# Patient Record
Sex: Female | Born: 1960 | Race: White | Hispanic: No | Marital: Married | Smoking: Never smoker
Health system: Southern US, Community
[De-identification: ages and names within clinical notes are randomized; demographics above are authoritative.]

## PROBLEM LIST (undated history)

## (undated) DIAGNOSIS — L02419 Cutaneous abscess of limb, unspecified: Secondary | ICD-10-CM

## (undated) DIAGNOSIS — F32A Depression, unspecified: Secondary | ICD-10-CM

## (undated) DIAGNOSIS — Z1379 Encounter for other screening for genetic and chromosomal anomalies: Secondary | ICD-10-CM

## (undated) DIAGNOSIS — D701 Agranulocytosis secondary to cancer chemotherapy: Secondary | ICD-10-CM

## (undated) DIAGNOSIS — I509 Heart failure, unspecified: Secondary | ICD-10-CM

## (undated) DIAGNOSIS — K219 Gastro-esophageal reflux disease without esophagitis: Secondary | ICD-10-CM

## (undated) DIAGNOSIS — D6481 Anemia due to antineoplastic chemotherapy: Secondary | ICD-10-CM

## (undated) DIAGNOSIS — R809 Proteinuria, unspecified: Secondary | ICD-10-CM

## (undated) DIAGNOSIS — I10 Essential (primary) hypertension: Secondary | ICD-10-CM

## (undated) DIAGNOSIS — J45909 Unspecified asthma, uncomplicated: Secondary | ICD-10-CM

## (undated) DIAGNOSIS — Z9889 Other specified postprocedural states: Secondary | ICD-10-CM

## (undated) DIAGNOSIS — R011 Cardiac murmur, unspecified: Secondary | ICD-10-CM

## (undated) DIAGNOSIS — R609 Edema, unspecified: Secondary | ICD-10-CM

## (undated) DIAGNOSIS — L259 Unspecified contact dermatitis, unspecified cause: Secondary | ICD-10-CM

## (undated) DIAGNOSIS — T451X5A Adverse effect of antineoplastic and immunosuppressive drugs, initial encounter: Secondary | ICD-10-CM

## (undated) DIAGNOSIS — C801 Malignant (primary) neoplasm, unspecified: Secondary | ICD-10-CM

## (undated) DIAGNOSIS — M129 Arthropathy, unspecified: Secondary | ICD-10-CM

## (undated) DIAGNOSIS — K5000 Crohn's disease of small intestine without complications: Secondary | ICD-10-CM

## (undated) DIAGNOSIS — L03119 Cellulitis of unspecified part of limb: Secondary | ICD-10-CM

## (undated) DIAGNOSIS — K625 Hemorrhage of anus and rectum: Secondary | ICD-10-CM

## (undated) DIAGNOSIS — F329 Major depressive disorder, single episode, unspecified: Secondary | ICD-10-CM

## (undated) DIAGNOSIS — C439 Malignant melanoma of skin, unspecified: Secondary | ICD-10-CM

## (undated) DIAGNOSIS — L739 Follicular disorder, unspecified: Secondary | ICD-10-CM

## (undated) DIAGNOSIS — R0602 Shortness of breath: Secondary | ICD-10-CM

## (undated) DIAGNOSIS — N393 Stress incontinence (female) (male): Secondary | ICD-10-CM

## (undated) DIAGNOSIS — Z6841 Body Mass Index (BMI) 40.0 and over, adult: Secondary | ICD-10-CM

## (undated) DIAGNOSIS — M1990 Unspecified osteoarthritis, unspecified site: Secondary | ICD-10-CM

## (undated) DIAGNOSIS — G4700 Insomnia, unspecified: Secondary | ICD-10-CM

## (undated) DIAGNOSIS — K5090 Crohn's disease, unspecified, without complications: Secondary | ICD-10-CM

## (undated) DIAGNOSIS — F419 Anxiety disorder, unspecified: Secondary | ICD-10-CM

## (undated) DIAGNOSIS — L88 Pyoderma gangrenosum: Secondary | ICD-10-CM

## (undated) DIAGNOSIS — K429 Umbilical hernia without obstruction or gangrene: Secondary | ICD-10-CM

## (undated) DIAGNOSIS — J45901 Unspecified asthma with (acute) exacerbation: Secondary | ICD-10-CM

## (undated) DIAGNOSIS — J302 Other seasonal allergic rhinitis: Secondary | ICD-10-CM

## (undated) DIAGNOSIS — J449 Chronic obstructive pulmonary disease, unspecified: Secondary | ICD-10-CM

## (undated) DIAGNOSIS — S91009A Unspecified open wound, unspecified ankle, initial encounter: Secondary | ICD-10-CM

## (undated) DIAGNOSIS — B373 Candidiasis of vulva and vagina: Secondary | ICD-10-CM

## (undated) DIAGNOSIS — Z85820 Personal history of malignant melanoma of skin: Secondary | ICD-10-CM

## (undated) DIAGNOSIS — S81809A Unspecified open wound, unspecified lower leg, initial encounter: Secondary | ICD-10-CM

## (undated) DIAGNOSIS — E876 Hypokalemia: Secondary | ICD-10-CM

## (undated) DIAGNOSIS — S81009A Unspecified open wound, unspecified knee, initial encounter: Secondary | ICD-10-CM

## (undated) DIAGNOSIS — F411 Generalized anxiety disorder: Secondary | ICD-10-CM

## (undated) DIAGNOSIS — C4490 Unspecified malignant neoplasm of skin, unspecified: Secondary | ICD-10-CM

## (undated) HISTORY — DX: Unspecified malignant neoplasm of skin, unspecified: C44.90

## (undated) HISTORY — DX: Other specified postprocedural states: Z98.890

## (undated) HISTORY — DX: Cellulitis of unspecified part of limb: L03.119

## (undated) HISTORY — DX: Unspecified asthma with (acute) exacerbation: J45.901

## (undated) HISTORY — DX: Unspecified open wound, unspecified ankle, initial encounter: S91.009A

## (undated) HISTORY — DX: Proteinuria, unspecified: R80.9

## (undated) HISTORY — DX: Follicular disorder, unspecified: L73.9

## (undated) HISTORY — DX: Umbilical hernia without obstruction or gangrene: K42.9

## (undated) HISTORY — DX: Unspecified open wound, unspecified knee, initial encounter: S81.009A

## (undated) HISTORY — DX: Crohn's disease of small intestine without complications: K50.00

## (undated) HISTORY — DX: Depression, unspecified: F32.A

## (undated) HISTORY — DX: Morbid (severe) obesity due to excess calories: E66.01

## (undated) HISTORY — DX: Gastro-esophageal reflux disease without esophagitis: K21.9

## (undated) HISTORY — DX: Personal history of malignant melanoma of skin: Z85.820

## (undated) HISTORY — DX: Adverse effect of antineoplastic and immunosuppressive drugs, initial encounter: T45.1X5A

## (undated) HISTORY — DX: Candidiasis of vulva and vagina: B37.3

## (undated) HISTORY — DX: Chronic obstructive pulmonary disease, unspecified: J44.9

## (undated) HISTORY — DX: Anxiety disorder, unspecified: F41.9

## (undated) HISTORY — DX: Unspecified osteoarthritis, unspecified site: M19.90

## (undated) HISTORY — DX: Edema, unspecified: R60.9

## (undated) HISTORY — DX: Encounter for other screening for genetic and chromosomal anomalies: Z13.79

## (undated) HISTORY — PX: COLONOSCOPY WITH ESOPHAGOGASTRODUODENOSCOPY (EGD): SHX5779

## (undated) HISTORY — DX: Heart failure, unspecified: I50.9

## (undated) HISTORY — DX: Other seasonal allergic rhinitis: J30.2

## (undated) HISTORY — DX: Cardiac murmur, unspecified: R01.1

## (undated) HISTORY — DX: Body Mass Index (BMI) 40.0 and over, adult: Z684

## (undated) HISTORY — DX: Unspecified open wound, unspecified lower leg, initial encounter: S81.809A

## (undated) HISTORY — DX: Hemorrhage of anus and rectum: K62.5

## (undated) HISTORY — DX: Shortness of breath: R06.02

## (undated) HISTORY — DX: Stress incontinence (female) (male): N39.3

## (undated) HISTORY — DX: Malignant melanoma of skin, unspecified: C43.9

## (undated) HISTORY — DX: Hypokalemia: E87.6

## (undated) HISTORY — PX: DILATION AND CURETTAGE OF UTERUS: SHX78

## (undated) HISTORY — DX: Major depressive disorder, single episode, unspecified: F32.9

## (undated) HISTORY — PX: UMBILICAL HERNIA REPAIR: SHX196

## (undated) HISTORY — DX: Unspecified contact dermatitis, unspecified cause: L25.9

## (undated) HISTORY — DX: Crohn's disease, unspecified, without complications: K50.90

## (undated) HISTORY — DX: Essential (primary) hypertension: I10

## (undated) HISTORY — DX: Cutaneous abscess of limb, unspecified: L02.419

## (undated) HISTORY — DX: Generalized anxiety disorder: F41.1

## (undated) HISTORY — DX: Arthropathy, unspecified: M12.9

## (undated) HISTORY — DX: Anemia due to antineoplastic chemotherapy: D64.81

## (undated) HISTORY — DX: Agranulocytosis secondary to cancer chemotherapy: D70.1

## (undated) HISTORY — DX: Pyoderma gangrenosum: L88

---

## 2001-04-12 DIAGNOSIS — C4490 Unspecified malignant neoplasm of skin, unspecified: Secondary | ICD-10-CM | POA: Insufficient documentation

## 2001-06-10 HISTORY — PX: MELANOMA EXCISION: SHX5266

## 2005-11-15 ENCOUNTER — Ambulatory Visit: Payer: Self-pay | Admitting: Internal Medicine

## 2005-12-20 ENCOUNTER — Ambulatory Visit: Payer: Self-pay | Admitting: Internal Medicine

## 2006-01-12 ENCOUNTER — Ambulatory Visit: Payer: Self-pay | Admitting: Internal Medicine

## 2006-03-14 ENCOUNTER — Ambulatory Visit: Payer: Self-pay | Admitting: Internal Medicine

## 2006-04-19 ENCOUNTER — Ambulatory Visit: Payer: Self-pay | Admitting: Internal Medicine

## 2006-09-13 ENCOUNTER — Ambulatory Visit: Payer: Self-pay | Admitting: Internal Medicine

## 2006-11-10 ENCOUNTER — Encounter: Payer: Self-pay | Admitting: Internal Medicine

## 2006-11-10 ENCOUNTER — Ambulatory Visit (HOSPITAL_COMMUNITY): Admission: RE | Admit: 2006-11-10 | Discharge: 2006-11-10 | Payer: Self-pay | Admitting: Internal Medicine

## 2006-11-14 ENCOUNTER — Ambulatory Visit: Payer: Self-pay | Admitting: Internal Medicine

## 2007-02-21 ENCOUNTER — Ambulatory Visit: Payer: Self-pay | Admitting: Internal Medicine

## 2007-03-13 ENCOUNTER — Encounter: Admission: RE | Admit: 2007-03-13 | Discharge: 2007-03-13 | Payer: Self-pay | Admitting: Internal Medicine

## 2007-03-27 ENCOUNTER — Encounter: Admission: RE | Admit: 2007-03-27 | Discharge: 2007-03-27 | Payer: Self-pay | Admitting: Internal Medicine

## 2007-05-16 ENCOUNTER — Ambulatory Visit (HOSPITAL_COMMUNITY): Admission: RE | Admit: 2007-05-16 | Discharge: 2007-05-16 | Payer: Self-pay | Admitting: Obstetrics and Gynecology

## 2007-06-01 DIAGNOSIS — M129 Arthropathy, unspecified: Secondary | ICD-10-CM

## 2007-06-01 DIAGNOSIS — K5000 Crohn's disease of small intestine without complications: Secondary | ICD-10-CM | POA: Insufficient documentation

## 2007-06-01 DIAGNOSIS — J45909 Unspecified asthma, uncomplicated: Secondary | ICD-10-CM

## 2007-06-01 DIAGNOSIS — I10 Essential (primary) hypertension: Secondary | ICD-10-CM | POA: Insufficient documentation

## 2007-06-01 DIAGNOSIS — F411 Generalized anxiety disorder: Secondary | ICD-10-CM

## 2007-06-01 DIAGNOSIS — Z85820 Personal history of malignant melanoma of skin: Secondary | ICD-10-CM

## 2007-06-01 DIAGNOSIS — K219 Gastro-esophageal reflux disease without esophagitis: Secondary | ICD-10-CM | POA: Insufficient documentation

## 2007-06-01 HISTORY — DX: Generalized anxiety disorder: F41.1

## 2007-06-06 ENCOUNTER — Encounter: Admission: RE | Admit: 2007-06-06 | Discharge: 2007-06-06 | Payer: Self-pay | Admitting: Obstetrics and Gynecology

## 2007-08-07 ENCOUNTER — Telehealth: Payer: Self-pay | Admitting: Internal Medicine

## 2007-08-10 ENCOUNTER — Ambulatory Visit (HOSPITAL_COMMUNITY): Admission: RE | Admit: 2007-08-10 | Discharge: 2007-08-10 | Payer: Self-pay | Admitting: Obstetrics and Gynecology

## 2007-09-18 ENCOUNTER — Ambulatory Visit: Payer: Self-pay | Admitting: Internal Medicine

## 2007-10-04 ENCOUNTER — Telehealth: Payer: Self-pay | Admitting: Internal Medicine

## 2007-10-05 ENCOUNTER — Ambulatory Visit (HOSPITAL_COMMUNITY): Admission: RE | Admit: 2007-10-05 | Discharge: 2007-10-05 | Payer: Self-pay | Admitting: Internal Medicine

## 2007-10-05 ENCOUNTER — Encounter: Payer: Self-pay | Admitting: Internal Medicine

## 2007-10-08 ENCOUNTER — Encounter: Payer: Self-pay | Admitting: Internal Medicine

## 2007-10-09 ENCOUNTER — Ambulatory Visit: Payer: Self-pay | Admitting: Internal Medicine

## 2007-11-09 ENCOUNTER — Encounter: Payer: Self-pay | Admitting: Internal Medicine

## 2007-11-15 ENCOUNTER — Telehealth: Payer: Self-pay | Admitting: Internal Medicine

## 2007-11-16 ENCOUNTER — Telehealth: Payer: Self-pay | Admitting: Internal Medicine

## 2007-11-17 ENCOUNTER — Encounter: Payer: Self-pay | Admitting: Internal Medicine

## 2007-11-29 ENCOUNTER — Encounter: Payer: Self-pay | Admitting: Internal Medicine

## 2007-12-29 ENCOUNTER — Telehealth: Payer: Self-pay | Admitting: Internal Medicine

## 2008-02-01 ENCOUNTER — Ambulatory Visit: Payer: Self-pay | Admitting: Internal Medicine

## 2008-03-25 ENCOUNTER — Encounter: Payer: Self-pay | Admitting: Internal Medicine

## 2008-04-17 ENCOUNTER — Encounter: Admission: RE | Admit: 2008-04-17 | Discharge: 2008-04-17 | Payer: Self-pay | Admitting: Internal Medicine

## 2008-07-21 ENCOUNTER — Emergency Department (HOSPITAL_COMMUNITY): Admission: EM | Admit: 2008-07-21 | Discharge: 2008-07-22 | Payer: Self-pay | Admitting: Emergency Medicine

## 2008-07-21 ENCOUNTER — Emergency Department (HOSPITAL_BASED_OUTPATIENT_CLINIC_OR_DEPARTMENT_OTHER): Admission: EM | Admit: 2008-07-21 | Discharge: 2008-07-21 | Payer: Self-pay | Admitting: Emergency Medicine

## 2008-08-23 ENCOUNTER — Encounter (HOSPITAL_BASED_OUTPATIENT_CLINIC_OR_DEPARTMENT_OTHER): Admission: RE | Admit: 2008-08-23 | Discharge: 2008-11-21 | Payer: Self-pay | Admitting: General Surgery

## 2008-10-15 ENCOUNTER — Ambulatory Visit: Payer: Self-pay | Admitting: Internal Medicine

## 2008-10-15 DIAGNOSIS — K625 Hemorrhage of anus and rectum: Secondary | ICD-10-CM

## 2008-10-16 ENCOUNTER — Encounter: Payer: Self-pay | Admitting: Internal Medicine

## 2008-10-16 LAB — CONVERTED CEMR LAB
Eosinophils Relative: 1.4 % (ref 0.0–5.0)
HCT: 33 % — ABNORMAL LOW (ref 36.0–46.0)
Hemoglobin: 11.2 g/dL — ABNORMAL LOW (ref 12.0–15.0)
Lymphs Abs: 1.5 10*3/uL (ref 0.7–4.0)
Monocytes Relative: 7.3 % (ref 3.0–12.0)
Platelets: 245 10*3/uL (ref 150.0–400.0)
WBC: 8 10*3/uL (ref 4.5–10.5)

## 2008-11-14 ENCOUNTER — Telehealth: Payer: Self-pay | Admitting: Internal Medicine

## 2008-11-25 ENCOUNTER — Encounter (HOSPITAL_BASED_OUTPATIENT_CLINIC_OR_DEPARTMENT_OTHER): Admission: RE | Admit: 2008-11-25 | Discharge: 2009-02-20 | Payer: Self-pay | Admitting: General Surgery

## 2008-11-26 ENCOUNTER — Telehealth: Payer: Self-pay | Admitting: Internal Medicine

## 2008-12-09 ENCOUNTER — Telehealth: Payer: Self-pay | Admitting: Internal Medicine

## 2008-12-17 ENCOUNTER — Telehealth: Payer: Self-pay | Admitting: Internal Medicine

## 2008-12-20 ENCOUNTER — Telehealth: Payer: Self-pay | Admitting: Internal Medicine

## 2009-01-17 ENCOUNTER — Emergency Department (HOSPITAL_BASED_OUTPATIENT_CLINIC_OR_DEPARTMENT_OTHER): Admission: EM | Admit: 2009-01-17 | Discharge: 2009-01-18 | Payer: Self-pay | Admitting: Emergency Medicine

## 2009-01-20 ENCOUNTER — Ambulatory Visit: Admission: RE | Admit: 2009-01-20 | Discharge: 2009-01-20 | Payer: Self-pay | Admitting: General Surgery

## 2009-01-20 ENCOUNTER — Encounter (HOSPITAL_BASED_OUTPATIENT_CLINIC_OR_DEPARTMENT_OTHER): Payer: Self-pay | Admitting: General Surgery

## 2009-01-20 ENCOUNTER — Ambulatory Visit: Payer: Self-pay | Admitting: Vascular Surgery

## 2009-01-31 ENCOUNTER — Encounter: Payer: Self-pay | Admitting: Internal Medicine

## 2009-02-10 ENCOUNTER — Ambulatory Visit: Payer: Self-pay | Admitting: Internal Medicine

## 2009-02-17 ENCOUNTER — Ambulatory Visit (HOSPITAL_COMMUNITY): Admission: RE | Admit: 2009-02-17 | Discharge: 2009-02-17 | Payer: Self-pay | Admitting: General Surgery

## 2009-02-20 ENCOUNTER — Encounter: Payer: Self-pay | Admitting: Internal Medicine

## 2009-02-20 ENCOUNTER — Ambulatory Visit: Payer: Self-pay | Admitting: Internal Medicine

## 2009-02-20 ENCOUNTER — Ambulatory Visit (HOSPITAL_COMMUNITY): Admission: RE | Admit: 2009-02-20 | Discharge: 2009-02-20 | Payer: Self-pay | Admitting: Internal Medicine

## 2009-02-21 ENCOUNTER — Encounter (HOSPITAL_BASED_OUTPATIENT_CLINIC_OR_DEPARTMENT_OTHER): Admission: RE | Admit: 2009-02-21 | Discharge: 2009-05-22 | Payer: Self-pay | Admitting: General Surgery

## 2009-02-23 ENCOUNTER — Encounter: Payer: Self-pay | Admitting: Internal Medicine

## 2009-02-24 ENCOUNTER — Encounter: Payer: Self-pay | Admitting: Infectious Disease

## 2009-03-10 ENCOUNTER — Encounter (INDEPENDENT_AMBULATORY_CARE_PROVIDER_SITE_OTHER): Payer: Self-pay | Admitting: *Deleted

## 2009-03-10 DIAGNOSIS — S81809A Unspecified open wound, unspecified lower leg, initial encounter: Secondary | ICD-10-CM

## 2009-03-10 DIAGNOSIS — S91009A Unspecified open wound, unspecified ankle, initial encounter: Secondary | ICD-10-CM

## 2009-03-10 DIAGNOSIS — S81009A Unspecified open wound, unspecified knee, initial encounter: Secondary | ICD-10-CM

## 2009-03-10 HISTORY — DX: Unspecified open wound, unspecified knee, initial encounter: S81.009A

## 2009-03-11 ENCOUNTER — Encounter: Payer: Self-pay | Admitting: Internal Medicine

## 2009-03-24 ENCOUNTER — Ambulatory Visit: Payer: Self-pay | Admitting: Infectious Disease

## 2009-03-24 DIAGNOSIS — L03119 Cellulitis of unspecified part of limb: Secondary | ICD-10-CM

## 2009-03-24 DIAGNOSIS — R609 Edema, unspecified: Secondary | ICD-10-CM | POA: Insufficient documentation

## 2009-03-24 DIAGNOSIS — L02419 Cutaneous abscess of limb, unspecified: Secondary | ICD-10-CM

## 2009-03-24 LAB — CONVERTED CEMR LAB
Albumin: 4.3 g/dL (ref 3.5–5.2)
BUN: 18 mg/dL (ref 6–23)
CO2: 26 meq/L (ref 19–32)
Calcium: 9.7 mg/dL (ref 8.4–10.5)
Chloride: 99 meq/L (ref 96–112)
Eosinophils Absolute: 0.1 10*3/uL (ref 0.0–0.7)
Eosinophils Relative: 1 % (ref 0–5)
Glucose, Bld: 86 mg/dL (ref 70–99)
HCT: 37.5 % (ref 36.0–46.0)
Lymphs Abs: 2.2 10*3/uL (ref 0.7–4.0)
MCV: 84.3 fL (ref >=78.0)
Monocytes Absolute: 0.7 10*3/uL (ref 0.1–1.0)
Monocytes Relative: 7 % (ref 3–12)
Platelets: 346 10*3/uL (ref 150–400)
Potassium: 3.7 meq/L (ref 3.5–5.3)
RBC: 4.45 M/uL (ref 3.87–5.11)
WBC: 10.1 10*3/uL (ref 4.0–10.5)

## 2009-04-10 ENCOUNTER — Ambulatory Visit: Payer: Self-pay | Admitting: Infectious Disease

## 2009-04-10 DIAGNOSIS — L259 Unspecified contact dermatitis, unspecified cause: Secondary | ICD-10-CM

## 2009-04-10 DIAGNOSIS — B373 Candidiasis of vulva and vagina: Secondary | ICD-10-CM | POA: Insufficient documentation

## 2009-04-28 ENCOUNTER — Telehealth: Payer: Self-pay | Admitting: Infectious Disease

## 2009-05-02 ENCOUNTER — Encounter: Payer: Self-pay | Admitting: Internal Medicine

## 2009-05-12 ENCOUNTER — Ambulatory Visit: Payer: Self-pay | Admitting: Infectious Disease

## 2009-05-12 DIAGNOSIS — L88 Pyoderma gangrenosum: Secondary | ICD-10-CM

## 2009-05-12 LAB — CONVERTED CEMR LAB
Blood Glucose, Fingerstick: 104
Hgb A1c MFr Bld: 6.1 %

## 2009-05-22 ENCOUNTER — Telehealth: Payer: Self-pay | Admitting: Infectious Disease

## 2009-07-10 ENCOUNTER — Encounter: Admission: RE | Admit: 2009-07-10 | Discharge: 2009-07-10 | Payer: Self-pay | Admitting: Obstetrics and Gynecology

## 2010-03-06 ENCOUNTER — Encounter: Admission: RE | Admit: 2010-03-06 | Discharge: 2010-03-06 | Payer: Self-pay | Admitting: *Deleted

## 2010-05-02 ENCOUNTER — Encounter: Payer: Self-pay | Admitting: Gastroenterology

## 2010-05-03 ENCOUNTER — Encounter: Payer: Self-pay | Admitting: Obstetrics and Gynecology

## 2010-05-12 NOTE — Letter (Signed)
Summary: [facility]  [facility]   Imported By: Bubba Hales 05/12/2009 08:15:31  _____________________________________________________________________  External Attachment:    Type:   Image     Comment:   External Document

## 2010-05-12 NOTE — Progress Notes (Signed)
Summary: phone note-ty  Phone Note Call from Patient   Caller: Patient Call For: Rhina Brackett Dam MD Summary of Call: Patient called and decided not to take the steroids b/c of her weight, and her risk factors. She wants you to call her to discuss other abx. Initial call taken by: Jarrett Ables CMA,  May 22, 2009 3:09 PM  Follow-up for Phone Call        I will give her oral dapsone and topical temovate. She needs to make appt with Dr. Ronnald Ramp with Dermatology for further recs re topical steroids Follow-up by: Alcide Evener MD,  May 22, 2009 3:49 PM    New/Updated Medications: DAPSONE 100 MG TABS (DAPSONE) Take 1 tablet by mouth once a day CLOBETASOL PROPIONATE 0.05 % OINT (CLOBETASOL PROPIONATE) apply twice daily to leg lesoin for 10 days, please make appt with Dr. Ronnald Ramp for recommendations on further topical steroids Prescriptions: CLOBETASOL PROPIONATE 0.05 % OINT (CLOBETASOL PROPIONATE) apply twice daily to leg lesoin for 10 days, please make appt with Dr. Ronnald Ramp for recommendations on further topical steroids  #30 x 1   Entered and Authorized by:   Alcide Evener MD   Signed by:   Rhina Brackett Dam MD on 05/22/2009   Method used:   Electronically to        [facility]. [phone]* (retail)       [address]       [place], [place]  [address]       Ph: [phone]       Fax: [phone]   RxID:   [id number] DAPSONE 100 MG TABS (DAPSONE) Take 1 tablet by mouth once a day  #30 x 11   Entered and Authorized by:   Alcide Evener MD   Signed by:   Alcide Evener MD on 05/22/2009   Method used:   Electronically to        [facility] (retail)       [address]       [place], [place]  [address]       Ph: [phone]       Fax: [phone]   [place]:   [phone]
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
## 2010-05-12 NOTE — Progress Notes (Signed)
Summary: patient wanting lab results, and having c/o inflammation  Phone Note Call from Patient Call back at Home Phone [phone]   Caller: Patient Reason for Call: Acute Illness, Talk to Doctor Summary of Call: Patient called inquiring about lab results, and having increased inflammation, and redness. Initial call taken by: Alcide Evener MD,  April 29, 2009 5:13 PM  Follow-up for Phone Call        I discussed pts labs, culturs and antibiotics. I spent 40 minutes talking to her. We are consiering MRI of leg. We will hold off on antibiotics for now and check bavk ion by phone if neededed Follow-up by: Alcide Evener MD,  April 29, 2009 5:07 PM

## 2010-08-25 NOTE — Assessment & Plan Note (Signed)
[facility] OFFICE NOTE   NAME:CARROLLJayd, Forrey                       MRN:          [id number]  DATE:09/13/2006                            DOB:          1960/11/14    [name] is a 50 year old, white female with Crohn's disease of the  terminal ileum diagnosed on colonoscopy by Dr. Valli Glance in May 2007  showing ulcerations of the terminal ileum. She has also positive  Prometheus profile for IBD markers. Her other medical problems are  morbid obesity, gastroesophageal reflux disease.   MEDICATIONS:  1. Symbicort 160/4.5.  2. Vitamin E.  3. Acidophilus.  4. Chondroitin/glucosamine.  5. Pentasa 500 mg about 6 a day.  6. Robinul Forte 2 mg p.o. b.i.d.  7. Diovan 60 mg 2 daily.  8. Nexium 40 mg p.o. daily.  9. Proventil inhaler.  10.Fish oil.  11.Maxzide 37.5/25 one p.o. daily.   She comes today with the complaint of intermittent diarrhea and lower  abdominal pain with Flonase. Her diarrhea is in bouts lasting several  days up to a week. Her last episode occurred several weeks ago and ended  about a week ago. She is currently much better. During the episode, she  usually increases her Pentasa to about 6 or 7 tablets a day, total of 3  grams a day. She has also Robinul Forte, increasing the dose from 1 a  day to 2 a day. There has been no rectal bleeding. The patient has  managed to intentionally lose about 20 pounds since last appointment.   PHYSICAL EXAMINATION:  VITAL SIGNS:  Blood pressure 110/72, pulse 72 and  weight 300 pounds. Her last weight was 320 pounds.  GENERAL:  She was alert and oriented in no distress.  LUNGS:  Clear to auscultation.  COR:  Normal S1, normal S2.  ABDOMEN:  Obese, soft with minimal tenderness in the left lower  quadrant. Her right lower quadrant today was unremarkable. Bowel sounds  were normal active.  RECTAL:  There was normal rectal tone. Stool was soft, Hemoccult  negative.   A 50 year old white female with terminal ileitis by biopsies. She is  having exacerbations of her Crohn's disease. She has never taken 6-  mercaptopurine or Entocort. The patient is quite reluctant to take  steroids because of obesity and possibility of weight gain with this  medication. We are not really sure of the extent of her Crohn's disease  as far as her small bowel is concerned. She is not showing any signs of  small-bowel obstruction.   PLAN:  1. Consider small bowel capsule endoscopy to assess her Crohn's      disease of the small intestine. The patient does not want to go      through this test because it requires swallowing a large pill and      she feels she would not be able to do that.  2. Consider repeat colonoscopy and biopsy of the terminal ileum. She      would like to go ahead and let me colonoscope her so we can see the  pathology ourselves to see if the disease has progressed since last      year. Since she had problems with conscious sedation, I believe she      will need propofol anesthesia.  3. Continue Pentasa 500 mg 6 tablets a day.  4. Refill Robinul Forte 2 mg p.o. b.i.d.  5. Add Questran 4 grams 1 peg daily on p.r.n. basis.  6. Flagyl 250 mg dispensed 14, one p.o. b.i.d. for bacterial      overgrowth.     Lowella Bandy. Olevia Perches, MD  Electronically Signed    DMB/MedQ  DD: 09/13/2006  DT: 09/13/2006  Job #: [id number]   cc:   Tawanna Cooler, MD

## 2010-08-25 NOTE — Assessment & Plan Note (Signed)
[facility]   NAME:  [name], [name]              ACCOUNT NO.:  [id number]   MEDICAL RECORD NO.:  [id number]      DATE OF BIRTH:  1961-02-09   PHYSICIAN:  Kathrin Penner, M.D.         VISIT DATE:                                   OFFICE VISIT   PROBLEM:  Abscess, left posterior calf.   The patient was seen last on Aug 26, 2008.  This wound was packed with  plain gauze packing, and she was started on Augmentin 875 mg b.i.d.  She  returns today for a recheck of her wound.  The wound packing has been  removed completely.  There is still a small cavity there, which was  repacked with 1/4th inch gauze packing.  She is to continue on her  Augmentin 875 b.i.d.  In the interim, she has started to have some  vaginal itching.  I have started her on Diflucan 100 mg daily for the  next 5 days.      Kathrin Penner, M.D.  Electronically Signed     PB/MEDQ  D:  09/03/2008  T:  09/04/2008  Job:  [id number]

## 2010-08-25 NOTE — Assessment & Plan Note (Signed)
[facility]   NAME:  [name], [name]              ACCOUNT NO.:  [id number]   MEDICAL RECORD NO.:  [id number]      DATE OF BIRTH:  [birth year]   PHYSICIAN:  Elesa Hacker, M.D.         VISIT DATE:  11/26/2008                                   OFFICE VISIT   PROBLEM:  This is a 50 year old female who had drainage of her leg  abscess several months ago.  This was packed and the wound has been  getting smaller slowly.  Today, the patient is afebrile, stable.  The  wound is down to a pinpoint with abundant granulation tissues.  The  wound was probed.  There is no overhang and I could not find any  sinuses.  Temperature is 98.5.  pulse 72, respirations 20, and blood  pressure 130/70.   PLAN:  Wash daily and dress with a Band-Aid as long as necessary.  I  think packing is no longer necessary as the wound is very superficial  and quite small.   FOLLOWUP:  See in 2 weeks.      Elesa Hacker, M.D.  Electronically Signed     RA/MEDQ  D:  11/26/2008  T:  11/27/2008  Job:  [id number]

## 2010-08-25 NOTE — Assessment & Plan Note (Signed)
[facility] OFFICE NOTE   NAME:[name], [name]                       MRN:          [id number]  DATE:02/21/2007                            DOB:          10-30-1960    [name] is a 50 year old white female with inflammatory bowel  disease found on two prior colonoscopies, first one by Dr. Valli Glance in  2007. Most recent one in July 2008, showing nonspecific ileitis. This  was confirmed on the biopsies of the terminal ileum. She also has  positive inflammatory bowel disease markers, but her colon appeared  entirely normal. There was no evidence of inflammation. She has chronic  diarrhea, which has been controlled on combination of Questran and  Robinul Forte as well as on Pentasa 500 mg six tablets a day. Since the  last appointment, she has had some rectal pain that she wanted to have  checked today.   PHYSICAL EXAMINATION:  Blood pressure 130/72. Pulse 70. Weight is 310  pounds, which is a 10 pound weight gain since appointment in June.  ABDOMEN: Examination today is unremarkable with normoactive bowel  sounds. No focal tenderness. On anoscopic and rectal examination,  perianal area is normal. Anal canal is normal. There is no evidence of  fissure. Rectal ampulla appears unremarkable with normal mucosa. No  evidence of internal hemorrhoids with minimal discomfort on the digital  examination in the anal canal. Nothing to suggest abscess or fistula.   IMPRESSION:  1. A 50 year old white female with ileitis documented on recent      colonoscopy associated with positive irritable bowel disease      markers and no evidence of colitis.  2. Rectal pain was nonspecific. Currently, there is no evidence of      active disease.   PLAN:  1. Samples of Analpram to use p.r.n. rectal irritation.  2. Continue Pentasa, Robinul Forte and Questran.  3. The patient was referred to surgeon for evaluation of right breast  lump.     [name]. Olevia Perches, MD  Electronically Signed    DMB/MedQ  DD: 02/21/2007  DT: 02/21/2007  Job #: [id number]   cc:   Fortunato Curling, MD
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
## 2010-08-25 NOTE — Assessment & Plan Note (Signed)
[facility]   NAME:  [name], [name]              ACCOUNT NO.:  [id number]   MEDICAL RECORD NO.:  [id number]      DATE OF BIRTH:  1960/10/30   PHYSICIAN:  Kathrin Penner, M.D.    VISIT DATE:  12/09/2008                                   OFFICE VISIT   PROBLEM:  Ulcer, status post drainage of left posterior leg abscess in  this 50 year old female.  She returns today following her most recent  treatment.  She has no specific complaints referable to her wound at  this time and she feels that is quite well healed except for a very  small area of persistent erythema around the puncture site.   On examination, temperature is 97.6, pulse 70, respirations 19, and  blood pressure is 110/67.  There is some mild redness around the wound.  The wound is completely healed and on palpation, there is really no  significant tenderness.   Treatment today, I will start her on some mupirocin 2% to be used twice  daily for the next week.  Followup will be in 2 weeks p.r.n.      Kathrin Penner, M.D.  Electronically Signed     PB/MEDQ  D:  12/09/2008  T:  12/10/2008  Job:  [id number]

## 2010-08-25 NOTE — Assessment & Plan Note (Signed)
[facility]   NAME:  JOSEFITA, WEISSMANN NO.:  [id number]   MEDICAL RECORD NO.:  [id number]      DATE OF BIRTH:  12/29/1960   PHYSICIAN:  Ricard Dillon, M.D. VISIT DATE:  10/03/2008                                   OFFICE VISIT   Ms. Alberta is a lady who is status post surgical drainage of the left  posterior calf and treated initially with incremental advanced packings  of the wound.  She was treated with Augmentin 875 p.o. b.i.d.  There was  apparently a culture of this wound initially done at [facility].  We do  not have these results.  Culture done by Dr. Bubba Camp on Aug 26, 2008,  showed no growth.  The patient is simply covering this with a dry  dressing at this point.  There is continued drainage out of the small  surgical cavity.   On examination, the area is on her posterior left calf.  This has a firm  area superior to the opening with firm palpation.  There is  serosanguineous fluid expressed.  I did reculture this.  The wound area  measures 0.7 x 0.2, but I think the depth of this goes down to well over  a centimeter.   IMPRESSION:  Probably resolving left calf abscess with still some  serosanguineous drainage.  This could have also been a hematoma.  I have  recultured this area.  We have packed the opening with silver alginate,  which she will cover with a dry dressing and can be changed every 3  days.  We will see her again in a week's time.  I did not prescribe any  further antibiotic therapy.           ______________________________  Ricard Dillon, M.D.     MGR/MEDQ  D:  10/03/2008  T:  10/04/2008  Job:  [id number]

## 2010-08-25 NOTE — Assessment & Plan Note (Signed)
[facility]   NAME:  [name], [name]              ACCOUNT NO.:  [id number]   MEDICAL RECORD NO.:  [id number]      DATE OF BIRTH:  1960/09/17   PHYSICIAN:  Kathrin Penner, M.D.    VISIT DATE:  09/16/2008                                   OFFICE VISIT   PROBLEM  Left Calf Abscess   [name] is status post drainage of an abscess of the left posterior  calf treated with incremental advancement of packings of the wound.  She  was treated with Augmentin 875 mg b.i.d.  She returns today and is  feeling generally well.   EXAM  There is a small eschar over the wound which I went on the roof.  There  is a cavity of approximately 0.5 cm without any drainage.  The  surrounding area of the wound continues to be mildly indurated.  I think  I will continue her on Augmentin for another week.  We will dress her  wound with hydrogel and a dry dressing.  DISP  Follow up in 2 weeks.      Kathrin Penner, M.D.  Electronically Signed     PB/MEDQ  D:  09/16/2008  T:  09/17/2008  Job:  [id number]

## 2010-08-25 NOTE — Assessment & Plan Note (Signed)
[facility]   NAME:  [name], [name]              ACCOUNT NO.:  [id number]   MEDICAL RECORD NO.:  [id number]      DATE OF BIRTH:  06-07-1960   PHYSICIAN:  Kathrin Penner, M.D.    VISIT DATE:  11/04/2008                                   OFFICE VISIT   PROBLEM:  This is a 50 year old lady status post posterior leg abscess  on the left side, which was treated with packing and incremental  advancement of the packing to its current subcentimeter size.  She did  note that a pocket of either old blood or bloody plus drained during her  packing this past week.  She has not been having any fevers or chills.  There is no surrounding erythema to the wound, and she noted no odor to  the drainage.   On today's evaluation, the patient is afebrile with stable vital signs  and is in no distress.  I recultured the wound today.  I will stop her  from using Iodoform dressing and now move to a plain gauze packing  inside the wound, which is rather shallow at 0.2 cm in depth with 0.6 in  width and 0.5 in length.   My plan is for followup in 1 week.      Kathrin Penner, M.D.  Electronically Signed     PB/MEDQ  D:  11/04/2008  T:  11/05/2008  Job:  [id number]

## 2010-08-25 NOTE — Assessment & Plan Note (Signed)
[facility]   NAME:  [name], [name]              ACCOUNT NO.:  [id number]   MEDICAL RECORD NO.:  [id number]      DATE OF BIRTH:  02-13-1961   PHYSICIAN:  Kathrin Penner, M.D.    VISIT DATE:  10/21/2008                                   OFFICE VISIT   The patient is status post a left posterior calf abscess, treated with  packing in incremental advancement of now the wound is down to a  subcentimeter size with a depth of approximately 3 mm, healing  satisfactorily.  Most recent cultures showed no growth.  We will  continue her on a small amount of iodoform packing to keep the wound  opened.  Followup will be in 1 week.      Kathrin Penner, M.D.  Electronically Signed     PB/MEDQ  D:  10/21/2008  T:  10/22/2008  Job:  [id number]

## 2010-08-28 NOTE — Assessment & Plan Note (Signed)
[facility]                             PULMONARY OFFICE NOTE   NAME:[name], [name]                       MRN:          [id number]  DATE:03/14/2006                            DOB:          06-04-1960    PROBLEM LIST:  1. Asthma.  2. Allergic rhinitis.  3. Cough.  4. Hypertension.  5. Crohn's disease.  6. History melanoma resection.   HISTORY:  This woman was previously followed at [facility] where she was last seen in 2005. At that time,  she was using Foradil and Azmacort which says was the best regimen she  had found for her asthma but difficult to afford. She had had thinning  hair attributed to Asacol. She is followed by Dr. Delfin Edis for  Crohn's disease and by [name] for primary care. She comes today  saying Foradil and Azmacort did well for years. More recently she had  been working with [place]. [place]une [place] [facility]. He put her on a Pulmicort  inhaler and then switched her to Symbicort 160/4.5. She is still using a  rescue Proventil inhaler about once a day. Before the Symbicort she was  using it up to 6 times a day. She feels somewhat hoarse and notices that  when she is trying to sing at church. Sometimes she will notice some  shortness of breath singing. There has been more nasal congestion in the  last 2 months. I could not clearly relate it through her history to fall  season. She says she had no benefit from at least a couple of different  nasal sprays.   MEDICATIONS:  1. Vitamin E.  2. Triamterene 37.5/HCTZ 25.  3. Nexium 40 mg.  4. Tylenol arthritis 650 mg up to 6 daily.  5. Xanax 1 mg at h.s.  6. Pepcid 2-4 times daily.  7. Zyrtec 10 mg.  8. Diovan/HCTZ 160/12.5.  9. Proventil rescue inhaler.  10.Robinul Forte.  11.Promethazine 25 mg p.r.n. nausea.   DRUG INTOLERANCES:  Intolerance of CODEINE causing nausea and LEVAQUIN,  ERYTHROMYCIN.   OBJECTIVE:  VITAL SIGNS:  Weight 313  pounds, BP 122/70, pulse regular  84, room air saturation 98%.  HEENT:  I question minimal thrush. Thick tongue base, long palate.  GENERAL:  Obese body habitus, very talkative, many questions which were  appropriate and answered accordingly.  LUNGS:  Fields were clear. There was no cough or wheeze heard.  HEART:  Sounds were regular without murmur or gallop.  EXTREMITIES:  No tremor, cyanosis or clubbing.   IMPRESSION:  Asthma/rhinitis. I could not identify history suggestive of  significant reflux as a contributor  to her airway problems but she is  instructed to watch for this.   PLAN:  1. I gently suggested that weight loss would help her breathing.  2. Try sample of Singulair 10 mg daily.  3. Saline nasal lavage 2-3 times daily with instructions.  4. Travel here she says is difficult so I have offered to see her      again p.r.n. and she was comfortable with  this.     Clinton D. Annamaria Boots, MD, [place], FACP  Electronically Signed    CDY/MedQ  DD: 03/14/2006  DT: 03/15/2006  Job #: [id number]

## 2010-08-28 NOTE — Assessment & Plan Note (Signed)
[facility] OFFICE NOTE   NAME:[name], [name]                       MRN:          [id number]  DATE:12/20/2005                            DOB:          1960-06-28    PROGRESS NOTE:  [name] is a 50 year old white female diagnosed with  terminal ileum ulceration suggestive of Crohn's disease by [name] on  colonoscopy in May of 2007.  Her Promethius  profile for inflammatory bowel  disease markers is positive, consistent with Crohn's disease.  She has  chronic diarrhea.  We have put her on Robinul Forte an anti-spasmotic, which  seemed to improve the diarrhea tremendously.  She is complaining of  malodorous stools, but no bleeding.  She is on multiple other medications  including ferrous sulfate.  We have reviewed the records which we obtained  from [name].  Her sedimentation rate was normal at 20.  Her Hemoccults  have been negative.   PHYSICAL EXAMINATION:  VITAL SIGNS:  Blood pressure 118/66, pulse 72, and  weight 317 pounds.  GENERAL:  She was in no distress today.  ABDOMEN:  Somewhat tender in the right lower quadrant and right middle  quadrant.  Bowel sounds were normoactive.  General physical exam was limited  because of her large size.  LUNGS:  Clear to auscultation.  RECTAL:  Normal perineal area.  Stool was Hemoccult negative.   IMPRESSION:  A 50 year old white female with suspected low-grade  inflammatory bowel disease, improved on antispasmodics.  She was intolerant  to Asacol because of hair loss.  She is likely to be intolerant to  Mesalazine products.   PLAN:  1. Flagyl 250 p.o. t.i.d. for 1 week.  2. Continue Robinul Forte at the regular dose of 2 mg twice a day.  3. Check her iron and iron binding capacity today to decide whether she      needs to continue the iron supplements.  4. Question of whether she should be started on prednisone at this time.      I would prefer to  wait, since she does not have dramatic symptoms, and      since she is high risk for developing diabetes and intolerance to      prednisone.  I will see her again in 8 weeks and reassess.                                   [name]. Kaylee Perches, MD   DMB/MedQ  DD:  12/20/2005  DT:  12/21/2005  Job #:  [id number]   cc:   [name], M.D.

## 2010-08-28 NOTE — Assessment & Plan Note (Signed)
[facility] OFFICE NOTE   NAME:[name], [name]                       MRN:          [id number]  DATE:04/19/2006                            DOB:          [birth year]    PROGRESS NOTE   [name] is a 50 year old patient of Dr. Ishmael Holter' with a history of  Crohn's disease in the terminal ileum and intermittent diarrhea.  She  had a flare-up of diarrheal illness on Thanksgiving, which she blames on  dietary indiscretion.  The diarrhea lasted several weeks.  She is now  improved about 50%.  There is some left lower quadrant abdominal pain  and tenderness in the right lower quadrant, but since she resumed her  Pentasa and Robinul Forte, her symptoms have improved.  She had a  colonoscopy by Dr. Valli Glance in May 2007 with findings of a terminal ileum  ulcerations consistent with Crohn's disease.  Also, her Prometheus  profile for inflammatory bowel disease antibody is positive.  She has  gastroesophageal reflux and obesity.   PHYSICAL EXAM:  Blood pressure 120/80, pulse 72, and weight 320 pounds,  which represents a weight gain of 7 pounds.  She was alert, oriented, no distress.  LUNGS:  Clear to auscultation.  COR:  With rapid S1 and S2.  ABDOMEN:  Obese, soft, tender in the left lower quadrant, left middle  quadrant, and also in the right lower quadrant.  There was no mass.  The  exam was limited because of the large size of her abdomen.  Upper  abdomen was unremarkable.  RECTAL:  Not done.  EXTREMITIES:  No edema.   IMPRESSION:  A 49 year old white female with inflammatory bowel disease  in the terminal ileum consistent with Crohn's disease based on  colonoscopic findings by Dr. Valli Glance in May 2007.  She has positive  markers for Crohn's disease.  The recent flare-up may be related to  bacterial overgrowth possibly, or also due to viral gastroenteritis.   PLAN:  1. Resume Pentasa at 500 mg 1 p.o. t.i.d.  2.  Continue Robinul Forte 2 mg once or twice a day.  3. Add Flagyl 250 mg t.i.d. for 10 days.  4. Consider adding Entocort to her regimen.  I would hold off on      Entocort at this time because of  her potential for diabetes and      weight gain.  5. Samples of Nexium and Pentasa given to the patient.    [name]. Olevia Perches, MD  Electronically Signed   DMB/MedQ  DD: 04/19/2006  DT: 04/19/2006  Job #: [id number]   cc:   Fortunato Curling, MD

## 2010-08-28 NOTE — Assessment & Plan Note (Signed)
[facility] OFFICE NOTE   NAME:[name], [name]                       MRN:          [id number]  DATE:11/15/2005                            DOB:          1960/12/12    [name] is a 50 year old white female self referred for second opinion  of diarrhea and presumed inflammatory bowel disease.  The patient brings  with her extensive records which are at least 60 pages.  I have not had a  chance  to review them since she brought them with her today but she has had  intermittent diarrhea, 6-8 bowel movements during the day, never at night,  for the past several years.  Colonoscopy in 2003 showed some small  ulcerations in the rectum but the biopsies were nondiagnostic.  She had  another colonoscopy May of this year apparently which also did not show any  evidence of inflammatory bowel disease on biopsy.  Her small bowel follow  through was normal.  She denies any bright red blood per rectum but has had  dark stools, according to her.  She is not sure if it was blood.  She has  urgency in the morning as well as after meals with some loose stools and  crampy abdominal pain.  She has occasional constipation, abdominal pain in  both lower quadrants.  She also had a barium enema in the past.  She thinks  she had colon polyps on the past or her father had colon polyps.   MEDICATIONS:  1.  Pentasa 500 mg four a day.  2.  Diovan 320 mg p.o. q.d.  This was recently increased from 160 mg q.d.  3.  Vitamin E.  4.  Triamterene 37.5/25.  5.  Furosemide 10 mg q.d.  6.  Nexium 40 mg p.o. q.d.  7.  Xanax 1 mg h.s.  8.  Klonopin 5 mg one or  two p.r.n.  9.  Pepcid Complete 2-4 a day.  10. Zyrtec.  11. Fish oil.  12. Cranberry juice.  13. __________, not taking at the moment.  14. Azmacort.  15. Promethazine p.r.n.   PAST MEDICAL HISTORY:  Melanoma, status post excision in March 2003.   FAMILY HISTORY:  Positive for  colon polyps in her father, heart disease in  uncle and father.   SOCIAL HISTORY:  Married, no children, 12th grade education.  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  The patient does not smoke, does not drink.   REVIEW OF SYSTEMS:  Positive for obesity.  No recent changes in weight,  excessive swelling in feet, dizziness, arthritic complaints, sleeping  problems, fatigue or iron deficiency.  Periods are regular.   PHYSICAL EXAMINATION:  VITAL SIGNS: Blood pressure 133/86, pulse 72, weight  314 pounds.  GENERAL:  She was obese, alert and oriented.  HEENT:  Sclera anicteric.  Oral cavity:  No active ulcers.  NECK:  Supple.  LUNGS:  Clear to auscultation.  COR:  Normal S1 and S2.  ABDOMEN:  Obese, soft and nontender with normoactive bowel sounds.  No  palpable mass.  RECTAL:  Normal  appearing and stool was yellow, Hemoccult negative.  EXTREMITIES:  There was 2+ edema.   LABORATORY DATA:  There are some labs in the medical records brought with  the patient.  She had a normal hemoglobin of 13 but 16% iron saturation.   IMPRESSION:  1.  This is a 50 year old white female who brought extensive medical records      which I have not had a chance  to review.  Suspected inflammatory bowel      disease but brief review shows no histological evidence of IBD.  Her      diarrhea sounds more like an irritable bowel syndrome, possible bacteria      overgrowth.  Doubt celiac sprue because of her obesity.  2.  Iron deficiency without anemia.  3.  Anxiety.  4.  Morbid obesity.   PLAN:  1.  Review old records.  2.  Samples of Flora-Q one p.o. q.d.  3.  Robinul forte 2 mg p.o. q.d.  4.  Inflammatory bowel disease.  __________ levels and sed rate.  Since      Pentazine and Asacol both can cause hair loss and she is complaining of      hair loss, I told her if we do not find evidence of IBD, that she will      be able to discontinue these medications.                                   [name].  Kaylee Perches, MD   DMB/MedQ  DD:  11/15/2005  DT:  11/15/2005  Job #:  [id number]   cc:   Tawanna Cooler, M.D.

## 2010-10-26 ENCOUNTER — Other Ambulatory Visit: Payer: Self-pay | Admitting: Gastroenterology

## 2010-10-26 DIAGNOSIS — Z1231 Encounter for screening mammogram for malignant neoplasm of breast: Secondary | ICD-10-CM

## 2010-11-03 ENCOUNTER — Ambulatory Visit
Admission: RE | Admit: 2010-11-03 | Discharge: 2010-11-03 | Disposition: A | Discharge status: Home / Self Care | Payer: BC Managed Care – PPO | Source: Ambulatory Visit | Attending: Gastroenterology | Admitting: Gastroenterology

## 2010-11-03 DIAGNOSIS — Z1231 Encounter for screening mammogram for malignant neoplasm of breast: Secondary | ICD-10-CM

## 2011-01-05 LAB — BASIC METABOLIC PANEL
BUN: 17
CO2: 27
Chloride: 98
Glucose, Bld: 91
Potassium: 3.7

## 2011-01-05 LAB — CBC
HCT: 37
Hemoglobin: 12.7
MCHC: 34.2
MCV: 84
RBC: 4.4
WBC: 7.9

## 2011-01-25 LAB — BASIC METABOLIC PANEL
BUN: 7
CO2: 28
Chloride: 103
Creatinine, Ser: 0.72
GFR calc Af Amer: >60
Glucose, Bld: 98

## 2011-03-25 ENCOUNTER — Encounter: Payer: Self-pay | Admitting: *Deleted

## 2011-04-01 ENCOUNTER — Ambulatory Visit: Payer: BC Managed Care – PPO | Admitting: Internal Medicine

## 2011-04-01 ENCOUNTER — Telehealth: Payer: Self-pay | Admitting: Internal Medicine

## 2011-04-01 NOTE — Telephone Encounter (Signed)
Message copied by Oliva Bustard on Thu Apr 01, 2011  3:23 PM ------      Message from: Larina Bras      Created: Thu Apr 01, 2011  1:59 PM      Regarding: FW: cancellation fee                   ----- Message -----         From: Lafayette Dragon, MD         Sent: 04/01/2011   1:07 PM           To: Dixon Boos, CMA      Subject: cancellation fee                                         Yes, please, charge cancellation fee.      ----- Message -----         From: Dixon Boos, CMA         Sent: 04/01/2011  12:40 PM           To: Lafayette Dragon, MD            Pt cancelled appointment same day with Dr Olevia Perches for "family emergency." Dr Olevia Perches, do you want to charge late cancellation fee?

## 2011-04-13 HISTORY — PX: COLONOSCOPY: SHX174

## 2011-04-27 ENCOUNTER — Ambulatory Visit: Payer: BC Managed Care – PPO | Admitting: Internal Medicine

## 2011-05-05 ENCOUNTER — Ambulatory Visit (INDEPENDENT_AMBULATORY_CARE_PROVIDER_SITE_OTHER): Payer: Medicare Other | Admitting: Internal Medicine

## 2011-05-05 ENCOUNTER — Encounter: Payer: Self-pay | Admitting: Internal Medicine

## 2011-05-05 ENCOUNTER — Other Ambulatory Visit (INDEPENDENT_AMBULATORY_CARE_PROVIDER_SITE_OTHER): Payer: Medicare Other

## 2011-05-05 VITALS — BP 134/80 | HR 75 | Ht 64.0 in | Wt 320.0 lb

## 2011-05-05 DIAGNOSIS — K5010 Crohn's disease of large intestine without complications: Secondary | ICD-10-CM

## 2011-05-05 DIAGNOSIS — K625 Hemorrhage of anus and rectum: Secondary | ICD-10-CM

## 2011-05-05 DIAGNOSIS — R197 Diarrhea, unspecified: Secondary | ICD-10-CM

## 2011-05-05 LAB — CBC WITH DIFFERENTIAL/PLATELET
Basophils Absolute: 0.1 10*3/uL (ref 0.0–0.1)
Basophils Relative: 0.8 % (ref 0.0–3.0)
Eosinophils Absolute: 0 10*3/uL (ref 0.0–0.7)
MCHC: 33.2 g/dL (ref 30.0–36.0)
MCV: 84.7 fl (ref 78.0–100.0)
Monocytes Absolute: 0.6 10*3/uL (ref 0.1–1.0)
Neutrophils Relative %: 70.9 % (ref 43.0–77.0)
RBC: 4.21 Mil/uL (ref 3.87–5.11)
RDW: 15.1 % — ABNORMAL HIGH (ref 11.5–14.6)

## 2011-05-05 MED ORDER — DICYCLOMINE HCL 20 MG PO TABS: 20.0000 mg | ORAL_TABLET | Freq: Every day | ORAL | Status: DC | PRN

## 2011-05-05 MED ORDER — HYDROCORTISONE ACE-PRAMOXINE 2.5-1 % RE CREA: TOPICAL_CREAM | Freq: Three times a day (TID) | RECTAL | Status: AC | PRN

## 2011-05-05 MED ORDER — PEG-KCL-NACL-NASULF-NA ASC-C 100 G PO SOLR: 1.0000 | Freq: Once | ORAL | Status: DC

## 2011-05-05 MED ORDER — ALIGN 4 MG PO CAPS: 1.0000 | ORAL_CAPSULE | ORAL | Status: DC

## 2011-05-05 NOTE — Patient Instructions (Signed)
You have been scheduled for a colonoscopy. Please follow written instructions given to you at your visit today.  Please pick up your prep kit at the pharmacy within the next 2-3 days. Your physician has requested that you go to the basement for the following lab work before leaving today: CBC, Sedimentation Rate We have sent the following medications to your pharmacy for you to pick up at your convenience: Analpram Bentyl We have given you samples of Align. This puts good bacteria back into your intestines. You should take 1 capsule by mouth once daily. If this works well for you, it can be purchased over the counter. CC: Dr Tawanna Cooler

## 2011-05-05 NOTE — Progress Notes (Signed)
[name] June 19, 1960 MRN [id number]   History of Present Illness:  This is a 51 year old white female with Crohn's disease of the terminal ileum, documented on prior colonoscopies in 2008. Her last colonoscopy in November 2010 showed a normal terminal ileum and colon. She is complaining of dark stools mixed with blood. She also has diarrhea and right lower quadrant abdominal pain. She has positive IBD markers. She was on Pentasa 500 mg 4 times a day but stopped it. A small bowel capsule endoscopy in 2008 was incomplete. She is morbidly obese.    Past Medical History  Diagnosis Date  . Pyoderma gangrenosum   . Melanoma     rt. dorsal leg  . Anxiety   . Arthritis   . Hypertension   . GERD (gastroesophageal reflux disease)   . Crohn disease    Past Surgical History  Procedure Date  . Melanoma excision 06/2001    right leg; also removed 2 lymph nodes    reports that she has never smoked. She has never used smokeless tobacco. She reports that she does not drink alcohol or use illicit drugs. family history includes Colon polyps in her father; Diabetes in her father; and Heart disease in her paternal uncle.  There is no history of Colon cancer. Allergies  Allergen Reactions  . Amoxicillin     REACTION: she had no difficulty with amoxicillin  . Butorphanol Tartrate   . Cephalosporins     REACTION: she has tolerated rocephin without difficulty  . Codeine     REACTION: nausea  . Levofloxacin     REACTION: tachycardia  . Moxifloxacin     REACTION: tachycardia but tolerated cirpofloxacin just fine        Review of Systems: Denies dysphagia odynophagia heartburn  The remainder of the 10 point ROS is negative except as outlined in H&P   Physical Exam: General appearance  Well developed, in no distress. Morbidly obese Eyes- non icteric. HEENT nontraumatic, normocephalic. Mouth no lesions, tongue papillated, no cheilosis. Neck supple without adenopathy, thyroid not enlarged,  no carotid bruits, no JVD. Lungs Clear to auscultation bilaterally. Cor normal S1, normal S2, regular rhythm, no murmur,  quiet precordium. Abdomen: Obese, small umbilical hernia. Rectus diathesis, mild tenderness right lower quadrant. Normoactive bowel sounds. Rectal: Normal rectal sphincter tone. No stool in the ampulla Extremities no pedal edema. Skin no lesions., Large scar left posterior calf secondary to prior injury Neurological alert and oriented x 3. Psychological normal mood and affect.  Assessment and Plan:  Problem #1 Crohn's disease with positive IBD markers. There was no evidence of active disease on a colonoscopy in November 2010. She was  Having bloody stools which cannot be reproduced on today's exam. We will check her CBC and sed rate today. She needs to go back on her Pentasa and we will prescribe Analpram cream 2.5% to apply to the rectum. She will also have Bentyl 20 mg when necessary for crampy abdominal pain and samples of probiotics. She will be scheduled for a colonoscopy with propofol.   05/05/2011 Delfin Edis

## 2011-05-06 ENCOUNTER — Telehealth: Payer: Self-pay | Admitting: *Deleted

## 2011-05-06 DIAGNOSIS — K5090 Crohn's disease, unspecified, without complications: Secondary | ICD-10-CM

## 2011-05-06 NOTE — Telephone Encounter (Signed)
Obtained a sample of Moviprep for patient. Left a message for her to call me.

## 2011-05-06 NOTE — Telephone Encounter (Signed)
OK to order CT scan of the abd/pelvis "Crohn's disease" but there is no reason for me to order CT scan of the leg, she needs to speak to her PCP about it. IV and oral contrast.

## 2011-05-06 NOTE — Telephone Encounter (Signed)
Spoke with [name] and gave her results and recommendations. [name] also wanted to see about getting a CT abd, chest and leg scan scheduled. She states Dr. Olevia Perches told her she would do this.  [name] also wants to know if she can do Miralax instead of Moviprep due to cost.Please, advise.

## 2011-05-06 NOTE — Telephone Encounter (Signed)
Message copied by Hulan Saas on Thu May 06, 2011 11:14 AM ------      Message from: Lafayette Dragon      Created: Thu May 06, 2011  9:37 AM       Please call pt with mildly low blood count. Please take OTC iron daily till 5 days before colonoscopy.

## 2011-05-06 NOTE — Telephone Encounter (Signed)
Left a message for patient to call me. 

## 2011-05-07 NOTE — Telephone Encounter (Signed)
Scheduled CT abd/pelvis [facility] radiology on 2/29/13 2:15/2:30 PM. NPO after 10:30 AM. Nicole Kindred) Needs to pick up contrast. Left a message for patient to call me.

## 2011-05-10 NOTE — Telephone Encounter (Signed)
Scheduled CT with Rose on 05/14/11 at 1:30 PM. NPO 4 hours prior. Contrast 2 and 1 hour prior.  Patient aware. Patient's contrast up front for pick up.

## 2011-05-10 NOTE — Telephone Encounter (Signed)
Spoke with patient and she will come tomorrow to pick up Moviprep and CT contrast. Left a message for Rose at [facility] to call me to schedule

## 2011-05-10 NOTE — Telephone Encounter (Signed)
Call [facility] radiology and cancelled CT since patient has not returned my calls.

## 2011-05-10 NOTE — Telephone Encounter (Signed)
Left a message for patient to call me. 

## 2011-05-11 ENCOUNTER — Other Ambulatory Visit (HOSPITAL_COMMUNITY): Payer: Medicare Other

## 2011-05-14 ENCOUNTER — Ambulatory Visit (INDEPENDENT_AMBULATORY_CARE_PROVIDER_SITE_OTHER)
Admission: RE | Admit: 2011-05-14 | Discharge: 2011-05-14 | Disposition: A | Discharge status: Home / Self Care | Payer: Medicare Other | Source: Ambulatory Visit | Attending: Internal Medicine | Admitting: Internal Medicine

## 2011-05-14 DIAGNOSIS — K5090 Crohn's disease, unspecified, without complications: Secondary | ICD-10-CM

## 2011-05-14 MED ORDER — IOHEXOL 300 MG/ML  SOLN
100.0000 mL | Freq: Once | INTRAMUSCULAR | Status: AC | PRN
  Administered 2011-05-14: 100 mL via INTRAVENOUS

## 2011-05-17 ENCOUNTER — Telehealth: Payer: Self-pay | Admitting: *Deleted

## 2011-05-17 NOTE — Telephone Encounter (Signed)
Message copied by Hulan Saas on Mon May 17, 2011  8:52 AM ------      Message from: Lafayette Dragon      Created: Fri May 14, 2011 11:08 PM       Please call pt with CT scan results, no active disease. Enlarged uterus ddue to fibroids. No active Crohn's disease.

## 2011-05-17 NOTE — Telephone Encounter (Signed)
Spoke with patient and gave her results as per Dr. Olevia Perches

## 2011-05-17 NOTE — Telephone Encounter (Signed)
Left a message for patient to call me. 

## 2011-05-18 ENCOUNTER — Ambulatory Visit (AMBULATORY_SURGERY_CENTER): Payer: Medicare Other | Admitting: Internal Medicine

## 2011-05-18 ENCOUNTER — Encounter: Payer: Self-pay | Admitting: Internal Medicine

## 2011-05-18 VITALS — BP 131/71 | HR 71 | Temp 96.0°F | Resp 16

## 2011-05-18 DIAGNOSIS — K5090 Crohn's disease, unspecified, without complications: Secondary | ICD-10-CM

## 2011-05-18 DIAGNOSIS — D126 Benign neoplasm of colon, unspecified: Secondary | ICD-10-CM

## 2011-05-18 MED ORDER — SODIUM CHLORIDE 0.9 % IV SOLN: 500.0000 mL | INTRAVENOUS | Status: DC

## 2011-05-18 NOTE — Op Note (Signed)
[facility] [address] [place], [place]  [address]  COLONOSCOPY PROCEDURE REPORT  PATIENT:  Bobie, Caris  MR#:  [id number] BIRTHDATE:  30-May-1960, 50 yrs. old  GENDER:  female ENDOSCOPIST:  Lowella Bandy. Olevia Perches, MD REF. BY:  Tawanna Cooler, M.D. PROCEDURE DATE:  05/18/2011 PROCEDURE:  Colonoscopy with biopsy ASA CLASS:  Class II INDICATIONS:  Crohn's disease ileitis 2008 colonoscopy, normal colon and TI in 2010, normal CT scan of the abd/pelvis 04/2011 MEDICATIONS:   MAC sedation, administered by CRNA, propofol (Diprivan) 300 mg  DESCRIPTION OF PROCEDURE:   After the risks and benefits and of the procedure were explained, informed consent was obtained. Digital rectal exam was performed and revealed no rectal masses. The LB 180AL [id number] endoscope was introduced through the anus and advanced to the cecum, which was identified by both the appendix and ileocecal valve.  The quality of the prep was good, using MoviPrep.  The instrument was then slowly withdrawn as the colon was fully examined. <<PROCEDUREIMAGES>>  FINDINGS:  No polyps or cancers were seen (see image1, image2, image3, image4, and image6).  Scattered diverticula were found in the sigmoid colon (see image5).  This was otherwise a normal examination of the colon. TI not intubated Random biopsies were obtained and sent to pathology.   Retroflexed views in the rectum revealed no abnormalities.    The scope was then withdrawn from the patient and the procedure completed.  COMPLICATIONS:  None ENDOSCOPIC IMPRESSION: 1) No polyps or cancers 2) Diverticula, scattered in the sigmoid colon 3) Otherwise normal examination no evidence of active Crohn's disease RECOMMENDATIONS: continue Pentasa 557m, 1 po bid  REPEAT EXAM:  In 5 year(s) for.  ______________________________ DLowella Bandy BOlevia Perches MD  CC:  n. eSIGNED:   DLowella Bandy Brodie at 05/18/2011 09:51 AM  CVerlan Friends [phone]

## 2011-05-18 NOTE — Progress Notes (Signed)
Patient did not experience any of the following events: a burn prior to discharge; a fall within the facility; wrong site/side/patient/procedure/implant event; or a hospital transfer or hospital admission upon discharge from the facility. (G8907) Patient did not have preoperative order for IV antibiotic SSI prophylaxis. (G8918)  

## 2011-05-18 NOTE — Patient Instructions (Addendum)
Please refer to your blue and neon green sheets for instructions regarding diet and activity for the rest of today.  You may resume your medications as you would normally take them.   Diverticulosis Diverticulosis is a common condition that develops when small pouches (diverticula) form in the wall of the colon. The risk of diverticulosis increases with age. It happens more often in people who eat a low-fiber diet. Most individuals with diverticulosis have no symptoms. Those individuals with symptoms usually experience abdominal pain, constipation, or loose stools (diarrhea). HOME CARE INSTRUCTIONS   Increase the amount of fiber in your diet as directed by your caregiver or dietician. This may reduce symptoms of diverticulosis.   Your caregiver may recommend taking a dietary fiber supplement.   Drink at least 6 to 8 glasses of water each day to prevent constipation.   Try not to strain when you have a bowel movement.   Your caregiver may recommend avoiding nuts and seeds to prevent complications, although this is still an uncertain benefit.   Only take over-the-counter or prescription medicines for pain, discomfort, or fever as directed by your caregiver.  FOODS WITH HIGH FIBER CONTENT INCLUDE:  Fruits. Apple, peach, pear, tangerine, raisins, prunes.   Vegetables. Brussels sprouts, asparagus, broccoli, cabbage, carrot, cauliflower, romaine lettuce, spinach, summer squash, tomato, winter squash, zucchini.   Starchy Vegetables. Baked beans, kidney beans, lima beans, split peas, lentils, potatoes (with skin).   Grains. Whole wheat bread, brown rice, bran flake cereal, plain oatmeal, white rice, shredded wheat, bran muffins.  SEEK IMMEDIATE MEDICAL CARE IF:   You develop increasing pain or severe bloating.   You have an oral temperature above 102 F (38.9 C), not controlled by medicine.   You develop vomiting or bowel movements that are bloody or black.  Document Released: 12/25/2003  Document Revised: 12/09/2010 Document Reviewed: 08/27/2009 [facility] Patient Information 2012 [employer].

## 2011-05-19 ENCOUNTER — Telehealth: Payer: Self-pay | Admitting: *Deleted

## 2011-05-19 NOTE — Telephone Encounter (Signed)
No answer, message left

## 2011-05-24 ENCOUNTER — Encounter: Payer: Self-pay | Admitting: Internal Medicine

## 2011-10-06 ENCOUNTER — Other Ambulatory Visit: Payer: Self-pay | Admitting: Obstetrics and Gynecology

## 2011-11-23 ENCOUNTER — Ambulatory Visit: Payer: Medicare Other | Admitting: Internal Medicine

## 2011-12-22 ENCOUNTER — Other Ambulatory Visit: Payer: Self-pay | Admitting: Gastroenterology

## 2011-12-22 ENCOUNTER — Other Ambulatory Visit: Payer: Self-pay | Admitting: *Deleted

## 2011-12-22 DIAGNOSIS — Z1231 Encounter for screening mammogram for malignant neoplasm of breast: Secondary | ICD-10-CM

## 2012-02-28 ENCOUNTER — Ambulatory Visit
Admission: RE | Admit: 2012-02-28 | Discharge: 2012-02-28 | Disposition: A | Discharge status: Home / Self Care | Payer: Medicare Other | Source: Ambulatory Visit | Attending: Gastroenterology | Admitting: Gastroenterology

## 2012-02-28 ENCOUNTER — Other Ambulatory Visit: Payer: Self-pay | Admitting: Obstetrics and Gynecology

## 2012-02-28 DIAGNOSIS — N63 Unspecified lump in unspecified breast: Secondary | ICD-10-CM

## 2012-02-28 DIAGNOSIS — Z1231 Encounter for screening mammogram for malignant neoplasm of breast: Secondary | ICD-10-CM

## 2012-03-07 ENCOUNTER — Other Ambulatory Visit: Payer: Medicare Other

## 2012-03-08 ENCOUNTER — Other Ambulatory Visit: Payer: Medicare Other

## 2012-03-21 ENCOUNTER — Other Ambulatory Visit: Payer: Medicare Other

## 2012-04-19 ENCOUNTER — Ambulatory Visit
Admission: RE | Admit: 2012-04-19 | Discharge: 2012-04-19 | Disposition: A | Discharge status: Home / Self Care | Payer: Medicare Other | Source: Ambulatory Visit | Attending: Obstetrics and Gynecology | Admitting: Obstetrics and Gynecology

## 2012-04-19 DIAGNOSIS — N63 Unspecified lump in unspecified breast: Secondary | ICD-10-CM

## 2012-06-23 ENCOUNTER — Encounter (HOSPITAL_BASED_OUTPATIENT_CLINIC_OR_DEPARTMENT_OTHER): Payer: Self-pay | Admitting: *Deleted

## 2012-06-23 ENCOUNTER — Emergency Department (HOSPITAL_BASED_OUTPATIENT_CLINIC_OR_DEPARTMENT_OTHER)
Admission: EM | Admit: 2012-06-23 | Discharge: 2012-06-24 | Disposition: A | Discharge status: Home / Self Care | Payer: Medicare Other | Attending: Emergency Medicine | Admitting: Emergency Medicine

## 2012-06-23 DIAGNOSIS — M7989 Other specified soft tissue disorders: Secondary | ICD-10-CM | POA: Insufficient documentation

## 2012-06-23 DIAGNOSIS — M129 Arthropathy, unspecified: Secondary | ICD-10-CM | POA: Insufficient documentation

## 2012-06-23 DIAGNOSIS — Z85820 Personal history of malignant melanoma of skin: Secondary | ICD-10-CM | POA: Insufficient documentation

## 2012-06-23 DIAGNOSIS — R51 Headache: Secondary | ICD-10-CM | POA: Insufficient documentation

## 2012-06-23 DIAGNOSIS — Z7982 Long term (current) use of aspirin: Secondary | ICD-10-CM | POA: Insufficient documentation

## 2012-06-23 DIAGNOSIS — F411 Generalized anxiety disorder: Secondary | ICD-10-CM | POA: Insufficient documentation

## 2012-06-23 DIAGNOSIS — Z8719 Personal history of other diseases of the digestive system: Secondary | ICD-10-CM | POA: Insufficient documentation

## 2012-06-23 DIAGNOSIS — IMO0002 Reserved for concepts with insufficient information to code with codable children: Secondary | ICD-10-CM | POA: Insufficient documentation

## 2012-06-23 DIAGNOSIS — J45909 Unspecified asthma, uncomplicated: Secondary | ICD-10-CM | POA: Insufficient documentation

## 2012-06-23 DIAGNOSIS — Z79899 Other long term (current) drug therapy: Secondary | ICD-10-CM | POA: Insufficient documentation

## 2012-06-23 DIAGNOSIS — I10 Essential (primary) hypertension: Secondary | ICD-10-CM | POA: Insufficient documentation

## 2012-06-23 DIAGNOSIS — Z872 Personal history of diseases of the skin and subcutaneous tissue: Secondary | ICD-10-CM | POA: Insufficient documentation

## 2012-06-23 DIAGNOSIS — K219 Gastro-esophageal reflux disease without esophagitis: Secondary | ICD-10-CM | POA: Insufficient documentation

## 2012-06-23 LAB — TROPONIN I: Troponin I: <0.3 ng/mL (ref <0.30)

## 2012-06-23 LAB — COMPREHENSIVE METABOLIC PANEL
ALT: 22 U/L (ref 0–35)
AST: 23 U/L (ref 0–37)
Albumin: 3.6 g/dL (ref 3.5–5.2)
Calcium: 9.3 mg/dL (ref 8.4–10.5)
Sodium: 137 mEq/L (ref 135–145)
Total Protein: 8.1 g/dL (ref 6.0–8.3)

## 2012-06-23 LAB — CBC WITH DIFFERENTIAL/PLATELET
Basophils Absolute: 0 10*3/uL (ref 0.0–0.1)
Basophils Relative: 0 % (ref 0–1)
Eosinophils Absolute: 0.1 10*3/uL (ref 0.0–0.7)
Eosinophils Relative: 1 % (ref 0–5)
Lymphocytes Relative: 22 % (ref 12–46)
MCH: 28.2 pg (ref 26.0–34.0)
MCHC: 32.2 g/dL (ref 30.0–36.0)
MCV: 87.8 fL (ref 78.0–100.0)
Platelets: 288 10*3/uL (ref 150–400)
RDW: 13.3 % (ref 11.5–15.5)
WBC: 7.5 10*3/uL (ref 4.0–10.5)

## 2012-06-23 LAB — URINALYSIS, ROUTINE W REFLEX MICROSCOPIC
Bilirubin Urine: NEGATIVE
Hgb urine dipstick: NEGATIVE
Specific Gravity, Urine: 1.011 (ref 1.005–1.030)
pH: 6 (ref 5.0–8.0)

## 2012-06-23 LAB — PRO B NATRIURETIC PEPTIDE: Pro B Natriuretic peptide (BNP): 131.4 pg/mL — ABNORMAL HIGH (ref 0–125)

## 2012-06-23 NOTE — ED Notes (Signed)
Pt lying on stretcher with mask on c/o several weeks of sob, pt is in no acute distress, consistently talking with no sob, states she was seen by md today and sent her for evaluation of enlarged heart and prolapsed valve, per pt md requested she have 2d echo and blood work done here, explained to patient that we are unable to do 2d echo here but would have md see patient and follow up with poc, pt with no signs of sob, pt laughing

## 2012-06-23 NOTE — ED Provider Notes (Signed)
History     CSN: [id number]  Arrival date & time 06/23/12  1839   First MD Initiated Contact with Patient 06/23/12 2141      Chief Complaint  Patient presents with  . Shortness of Breath    (Consider location/radiation/quality/duration/timing/severity/associated sxs/prior treatment) HPI Comments: Pt is a morbidly obese 52 year old woman, who says that her primary care physician at [facility] has been out for 2 months.  She has hypertension, and has been having trouble with headache.  She had been on Diovan, but had been changed to losartan when the price of Diovan generic went up.  She has a headache which she attributes to losartan.  She stopped taking losartan 2 day ago.  She was seen by a PA at a [place] office, who had her have an EKG and chest x-ray, which she brings with her.  She was advised to go to the ED to be evaluated with a CMP and BNP to check for congestive heart failure.   Review of her prior chart show that she has a prior history of asthma, Crohn's diesease, and pyoderma gangrenosum.  She has had excision of a melanoma from her right leg in 2003.  Patient is a 52 y.o. female presenting with hypertension. The history is provided by the patient. No language interpreter was used.  Hypertension This is a chronic problem. The current episode started more than 1 week ago. The problem occurs constantly. The problem has not changed since onset.Associated symptoms include headaches and shortness of breath. Pertinent negatives include no chest pain and no abdominal pain. Nothing aggravates the symptoms. Nothing relieves the symptoms. Treatments tried: She stopped losartan 2 days ago, without relief.    Past Medical History  Diagnosis Date  . Pyoderma gangrenosum   . Melanoma     rt. dorsal leg  . Anxiety   . Arthritis   . Hypertension   . GERD (gastroesophageal reflux disease)   . Crohn disease     Past Surgical History  Procedure Laterality Date  .  Melanoma excision  06/2001    right leg; also removed 2 lymph nodes    Family History  Problem Relation Age of Onset  . Colon cancer Neg Hx   . Colon polyps Father   . Diabetes Father   . Heart disease Paternal Uncle     History  Substance Use Topics  . Smoking status: Never Smoker   . Smokeless tobacco: Never Used  . Alcohol Use: No    OB History   Grav Para Term Preterm Abortions TAB SAB Ect Mult Living                  Review of Systems  Constitutional: Negative for fever and chills.  Eyes: Negative.   Respiratory: Positive for shortness of breath.   Cardiovascular: Positive for leg swelling. Negative for chest pain and palpitations.  Gastrointestinal: Negative.  Negative for abdominal pain.  Genitourinary: Negative.   Musculoskeletal: Negative.   Skin: Negative.   Neurological: Positive for headaches.  Psychiatric/Behavioral: Negative.     Allergies  Amoxicillin; Butorphanol tartrate; Cephalosporins; Codeine; Levofloxacin; and Moxifloxacin  Home Medications   Current Outpatient Rx  Name  Route  Sig  Dispense  Refill  . Albuterol (PROVENTIL IN)   Inhalation   Inhale into the lungs as needed.         Marland Kitchen alprazolam (XANAX) 2 MG tablet   Oral   Take 2 mg by mouth. 1/2 tab AM  and 1 tab PM         . Aspirin-Acetaminophen-Caffeine (PAMPRIN MAX PO)   Oral   Take by mouth as needed.         . budesonide-formoterol (SYMBICORT) 160-4.5 MCG/ACT inhaler   Inhalation   Inhale 2 puffs into the lungs 2 (two) times daily.         Marland Kitchen EXPIRED: dicyclomine (BENTYL) 20 MG tablet   Oral   Take 1 tablet (20 mg total) by mouth daily as needed.   30 tablet   2   . glucosamine-chondroitin 500-400 MG tablet   Oral   Take 3 tablets by mouth 2 (two) times daily.         . Lactobacillus (ACIDOPHILUS PO)   Oral   Take by mouth daily.         Marland Kitchen levocetirizine (XYZAL) 5 MG tablet   Oral   Take 5 mg by mouth every evening.         . Loperamide HCl (IMODIUM  A-D PO)   Oral   Take by mouth as needed.         . montelukast (SINGULAIR) 10 MG tablet   Oral   Take 10 mg by mouth at bedtime.         . Omega-3 Fatty Acids (FISH OIL) 1000 MG CAPS   Oral   Take 2 capsules by mouth daily.         . peg 3350 powder (MOVIPREP) 100 G SOLR   Oral   Take 1 kit (100 g total) by mouth once.   1 kit   0   . Probiotic Product (ALIGN) 4 MG CAPS   Oral   Take 1 capsule by mouth 1 day or 1 dose.   30 capsule   0   . promethazine (PHENERGAN) 25 MG tablet   Oral   Take 25 mg by mouth every 6 (six) hours as needed.         . ranitidine (ZANTAC) 150 MG tablet               . sertraline (ZOLOFT) 50 MG tablet   Oral   Take 50 mg by mouth daily.         Marland Kitchen triamterene-hydrochlorothiazide (MAXZIDE-25) 37.5-25 MG per tablet   Oral   Take 1 tablet by mouth daily.         . valsartan (DIOVAN) 160 MG tablet   Oral   Take 160 mg by mouth daily.         . vitamin E 400 UNIT capsule   Oral   Take 400 Units by mouth daily.           BP 192/98  Pulse 85  Temp(Src) 97.6 F (36.4 C) (Oral)  Resp 18  Wt 305 lb (138.347 kg)  BMI 52.33 kg/m2  SpO2 100%  Physical Exam  Nursing note and vitals reviewed. Constitutional:  Morbidly obese woman in no distress.  BP 192/98.  HENT:  Head: Normocephalic and atraumatic.  Right Ear: External ear normal.  Left Ear: External ear normal.  Mouth/Throat: Oropharynx is clear and moist.  Eyes: Conjunctivae and EOM are normal. Pupils are equal, round, and reactive to light.  Neck: Normal range of motion. Neck supple.  Unable to evaluate for JVD because of pt's body habitus.  Cardiovascular: Normal rate, regular rhythm and normal heart sounds.   Pulmonary/Chest: Effort normal and breath sounds normal. She has no wheezes. She has no rales. She  exhibits no tenderness.  Abdominal: Soft. Bowel sounds are normal. She exhibits no distension. There is no tenderness.  Musculoskeletal: Normal range of  motion. She exhibits no edema.  Pt's legs are obese, but not edematous.  Skin: Skin is warm and dry.  Psychiatric: She has a normal mood and affect. Her behavior is normal.    ED Course  Procedures (including critical care time)   Date: 06/23/2012 EKG sent in from [facility]  Rate: 68  Rhythm: normal sinus rhythm  QRS Axis: left  Intervals: normal  ST/T Wave abnormalities: normal  Conduction Disutrbances:none  Narrative Interpretation: Normal EKG  Old EKG Reviewed: none available  Chest x-ray from [facility], as interpreted by me:  Poor inspiration, Heart borderline enlarged, no CHF.   Results for orders placed during the hospital encounter of 06/23/12  CBC WITH DIFFERENTIAL      Result Value Range   WBC 7.5  4.0 - 10.5 K/uL   RBC 4.18  3.87 - 5.11 MIL/uL   Hemoglobin 11.8 (*) 12.0 - 15.0 g/dL   HCT 36.7  36.0 - 46.0 %   MCV 87.8  78.0 - 100.0 fL   MCH 28.2  26.0 - 34.0 pg   MCHC 32.2  30.0 - 36.0 g/dL   RDW 13.3  11.5 - 15.5 %   Platelets 288  150 - 400 K/uL   Neutrophils Relative 68  43 - 77 %   Neutro Abs 5.1  1.7 - 7.7 K/uL   Lymphocytes Relative 22  12 - 46 %   Lymphs Abs 1.6  0.7 - 4.0 K/uL   Monocytes Relative 10  3 - 12 %   Monocytes Absolute 0.7  0.1 - 1.0 K/uL   Eosinophils Relative 1  0 - 5 %   Eosinophils Absolute 0.1  0.0 - 0.7 K/uL   Basophils Relative 0  0 - 1 %   Basophils Absolute 0.0  0.0 - 0.1 K/uL  COMPREHENSIVE METABOLIC PANEL      Result Value Range   Sodium 137  135 - 145 mEq/L   Potassium 3.6  3.5 - 5.1 mEq/L   Chloride 98  96 - 112 mEq/L   CO2 29  19 - 32 mEq/L   Glucose, Bld 89  70 - 99 mg/dL   BUN 21  6 - 23 mg/dL   Creatinine, Ser 1.00  0.50 - 1.10 mg/dL   Calcium 9.3  8.4 - 10.5 mg/dL   Total Protein 8.1  6.0 - 8.3 g/dL   Albumin 3.6  3.5 - 5.2 g/dL   AST 23  0 - 37 U/L   ALT 22  0 - 35 U/L   Alkaline Phosphatase 73  39 - 117 U/L   Total Bilirubin 0.3  0.3 - 1.2 mg/dL   GFR calc non Af Amer 64 (*) >90 mL/min    GFR calc Af Amer 74 (*) >90 mL/min  URINALYSIS, ROUTINE W REFLEX MICROSCOPIC      Result Value Range   Color, Urine YELLOW  YELLOW   APPearance CLEAR  CLEAR   Specific Gravity, Urine 1.011  1.005 - 1.030   pH 6.0  5.0 - 8.0   Glucose, UA NEGATIVE  NEGATIVE mg/dL   Hgb urine dipstick NEGATIVE  NEGATIVE   Bilirubin Urine NEGATIVE  NEGATIVE   Ketones, ur NEGATIVE  NEGATIVE mg/dL   Protein, ur NEGATIVE  NEGATIVE mg/dL   Urobilinogen, UA 0.2  0.0 - 1.0 mg/dL   Nitrite NEGATIVE  NEGATIVE  Leukocytes, UA NEGATIVE  NEGATIVE  TROPONIN I      Result Value Range   Troponin I <0.30  <0.30 ng/mL  PRO B NATRIURETIC PEPTIDE      Result Value Range   Pro B Natriuretic peptide (BNP) 131.4 (*) 0 - 125 pg/mL    Lab tests were essentiallly negative.  Specifically, there was no evidence of CHF.  Pt has hypertension, needs to continue treatment for that.  She should continue to take Maxzide.  She did not want to continue to take losartan.  I advised her to add clonidine 0.2 mg qd.  She has an appointment to see her cardiologist next week.  I advised her to take the copies of her lab work to that appointment.   1. Hypertension             Mylinda Latina III, MD 06/24/12 1059

## 2012-06-23 NOTE — ED Notes (Signed)
MD at bedside. 

## 2012-06-23 NOTE — ED Notes (Addendum)
Sob off and on for a month. Left her MDs office after being seen and having a CXR and EKG. She was told to come here for further evaluation to r/o CHF. She is not sob at triage and is pain free.

## 2012-06-24 MED ORDER — CLONIDINE HCL 0.2 MG PO TABS: ORAL_TABLET | ORAL | Status: DC

## 2012-06-24 NOTE — ED Notes (Signed)
MD at bedside. 

## 2013-04-24 ENCOUNTER — Other Ambulatory Visit: Payer: Self-pay

## 2013-04-24 DIAGNOSIS — Z1231 Encounter for screening mammogram for malignant neoplasm of breast: Secondary | ICD-10-CM

## 2013-05-17 ENCOUNTER — Ambulatory Visit: Payer: Medicare Other

## 2013-05-21 ENCOUNTER — Ambulatory Visit: Payer: Self-pay

## 2013-05-21 ENCOUNTER — Ambulatory Visit
Admission: RE | Admit: 2013-05-21 | Discharge: 2013-05-21 | Disposition: A | Discharge status: Home / Self Care | Payer: Medicare HMO | Source: Ambulatory Visit

## 2013-05-21 DIAGNOSIS — Z1231 Encounter for screening mammogram for malignant neoplasm of breast: Secondary | ICD-10-CM

## 2013-08-07 NOTE — H&P (Signed)
   [name] presents today for hysteroscopy and D&C  She has a history of fibroids and had some abnormal bleeding  Back on March 9 she presented for ultrasound due to some abnormal bleeding, at which time we saw a small questionable density in the endometrial cavity.  Because she had not had a cycle at that time, we recommended cycling her with Provera and have her come back in a couple of weeks after her withdrawal bleed.  The patient went home, took two or three days of Provera, felt like she was losing hair and stopped.  She called back and we recommended that she go ahead and do the Provera.  She tried it again, started losing hair and stopped it.  So because of that, she called back and said, I just want to go forward with the D&C.  She had not had any type of withdrawal bleeding and has not had any bleeding since then.  So today she presents for follow-up ultrasound. O: Ultrasound does show multiple fibroids consistent with previous fibroids.  There is, however, a 15 mm density near the fundal area of the endometrial cavity seen on previous scan, which has not changed.  The patient declines saline infusion ultrasound.   A&P: Postmenopausal bleeding, endometrial density.  I recommend hysteroscopy, D&C for evaluation and removal of this.  We discussed the risks and benefits of the procedure at length.  Informed consent was obtained.

## 2013-08-08 NOTE — Patient Instructions (Signed)
   Your procedure is scheduled on:  Friday, May 8  Enter through the Main Entrance of [facility] at: 6 AM Pick up the phone at the desk and dial [phone] and inform us of your arrival.  Please call this number if you have any problems the morning of surgery: [phone]  Remember: Do not eat or drink after midnight: Thursday Take these medicines the morning of surgery with a SIP OF WATER:  Do not wear jewelry, make-up, or FINGER nail polish No metal in your hair or on your body. Do not wear lotions, powders, perfumes.  You may wear deodorant.  Do not bring valuables to the hospital. Contacts, dentures or bridgework may not be worn into surgery.  Patients discharged on the day of surgery will not be allowed to drive home.

## 2013-08-09 ENCOUNTER — Encounter (HOSPITAL_COMMUNITY): Payer: Self-pay | Admitting: Pharmacist

## 2013-08-10 ENCOUNTER — Inpatient Hospital Stay (HOSPITAL_COMMUNITY)
Admission: RE | Admit: 2013-08-10 | Discharge: 2013-08-10 | Disposition: A | Discharge status: Home / Self Care | Payer: Medicare HMO | Source: Ambulatory Visit

## 2013-08-10 NOTE — Pre-Procedure Instructions (Signed)
Patient no showed for today's PAT appt.  Barbara in Admissions called but no answer.  LMOM.  Will have to reschedule PAT appt.

## 2013-08-13 NOTE — H&P (Addendum)
  VERENA SHAWGO  [id number]  07/05/13 S: [name] presents today for ultrasound.  She has a history of fibroids and had some abnormal bleeding.  Back on March 9 she presented for ultrasound due to some abnormal bleeding, at which time we saw a small questionable density in the endometrial cavity.  Because she had not had a cycle at that time, we recommended cycling her with Provera and have her come back in a couple of weeks after her withdrawal bleed.  The patient went home, took two or three days of Provera, felt like she was losing hair and stopped.  She called back and we recommended that she go ahead and do the Provera.  She tried it again, started losing hair and stopped it.  So because of that, she called back and said, I just want to go forward with the D&C.  She had not had any type of withdrawal bleeding and has not had any bleeding since then.  So today she presents for follow-up ultrasound. O: Ultrasound does show multiple fibroids consistent with previous fibroids.  There is, however, a 15 mm density near the fundal area of the endometrial cavity seen on previous scan, which has not changed.  The patient declines saline infusion ultrasound.   A&P: Postmenopausal bleeding, endometrial density.  I recommend hysteroscopy, D&C for evaluation and removal of this.  We discussed the risks and benefits of the procedure at length.  Informed consent was obtained.    08/17/13 0715 This patient has been seen and examined.   All of her questions were answered.  Labs and vital signs reviewed.  Informed consent has been obtained.  The History and Physical is current. DL

## 2013-08-15 ENCOUNTER — Encounter (HOSPITAL_COMMUNITY): Payer: Self-pay

## 2013-08-15 ENCOUNTER — Encounter (HOSPITAL_COMMUNITY)
Admission: RE | Admit: 2013-08-15 | Discharge: 2013-08-15 | Disposition: A | Discharge status: Home / Self Care | Payer: Medicare HMO | Source: Ambulatory Visit | Attending: Obstetrics and Gynecology | Admitting: Obstetrics and Gynecology

## 2013-08-15 HISTORY — DX: Unspecified asthma, uncomplicated: J45.909

## 2013-08-15 HISTORY — DX: Insomnia, unspecified: G47.00

## 2013-08-15 LAB — CBC
HCT: 37.5 % (ref 36.0–46.0)
HEMOGLOBIN: 12.3 g/dL (ref 12.0–15.0)
MCH: 28.7 pg (ref 26.0–34.0)
MCHC: 32.8 g/dL (ref 30.0–36.0)
MCV: 87.6 fL (ref 78.0–100.0)
Platelets: 270 10*3/uL (ref 150–400)
RBC: 4.28 MIL/uL (ref 3.87–5.11)
RDW: 13.2 % (ref 11.5–15.5)
WBC: 6 10*3/uL (ref 4.0–10.5)

## 2013-08-15 LAB — BASIC METABOLIC PANEL
BUN: 18 mg/dL (ref 6–23)
CO2: 28 mEq/L (ref 19–32)
CREATININE: 0.78 mg/dL (ref 0.50–1.10)
Calcium: 9.6 mg/dL (ref 8.4–10.5)
Chloride: 96 mEq/L (ref 96–112)
GFR calc Af Amer: >90 mL/min (ref >90)
GLUCOSE: 93 mg/dL (ref 70–99)
POTASSIUM: 3.5 meq/L — AB (ref 3.7–5.3)
Sodium: 136 mEq/L — ABNORMAL LOW (ref 137–147)

## 2013-08-15 NOTE — Patient Instructions (Signed)
20 [name]  08/15/2013   Your procedure is scheduled on:  08/17/13  Enter through the Main Entrance of [facility] [place] up the phone at the desk and dial [phone].   Call this number if you have problems the morning of surgery: [phone]   Remember:   Do not eat food:After Midnight.  Do not drink clear liquids: After Midnight.  Take these medicines the morning of surgery with A SIP OF WATER: blood pressure medications, bring inhaler, take Zantac, may take Xanax if needed.   Do not wear jewelry, make-up or nail polish.  Do not wear lotions, powders, or perfumes. You may wear deodorant.  Do not shave 48 hours prior to surgery.  Do not bring valuables to the hospital.  [facility] is not   responsible for any belongings or valuables brought to the hospital.  Contacts, dentures or bridgework may not be worn into surgery.  Leave suitcase in the car. After surgery it may be brought to your room.  For patients admitted to the hospital, checkout time is 11:00 AM the day of              discharge.   Patients discharged the day of surgery will not be allowed to drive             home.  Name and phone number of your driver: husband   Simona Huh  Special Instructions:      Please read over the following fact sheets that you were given:   Surgical Site Infection Prevention

## 2013-08-16 MED ORDER — GENTAMICIN SULFATE 40 MG/ML IJ SOLN
INTRAVENOUS | Status: AC
  Administered 2013-08-17: 116.75 mL via INTRAVENOUS
  Filled 2013-08-16: qty 10.75

## 2013-08-17 ENCOUNTER — Encounter (HOSPITAL_COMMUNITY): Payer: Medicare HMO | Admitting: Anesthesiology

## 2013-08-17 ENCOUNTER — Ambulatory Visit (HOSPITAL_COMMUNITY)
Admission: RE | Admit: 2013-08-17 | Discharge: 2013-08-17 | Disposition: A | Discharge status: Home / Self Care | Payer: Medicare HMO | Source: Ambulatory Visit | Attending: Obstetrics and Gynecology | Admitting: Obstetrics and Gynecology

## 2013-08-17 ENCOUNTER — Ambulatory Visit (HOSPITAL_COMMUNITY): Payer: Medicare HMO | Admitting: Anesthesiology

## 2013-08-17 ENCOUNTER — Encounter (HOSPITAL_COMMUNITY): Payer: Self-pay | Admitting: General Practice

## 2013-08-17 ENCOUNTER — Encounter (HOSPITAL_COMMUNITY)
Admission: RE | Disposition: A | Discharge status: Home / Self Care | Payer: Self-pay | Source: Ambulatory Visit | Attending: Obstetrics and Gynecology

## 2013-08-17 ENCOUNTER — Other Ambulatory Visit: Payer: Self-pay

## 2013-08-17 DIAGNOSIS — K219 Gastro-esophageal reflux disease without esophagitis: Secondary | ICD-10-CM | POA: Insufficient documentation

## 2013-08-17 DIAGNOSIS — Z9889 Other specified postprocedural states: Secondary | ICD-10-CM

## 2013-08-17 DIAGNOSIS — I10 Essential (primary) hypertension: Secondary | ICD-10-CM | POA: Insufficient documentation

## 2013-08-17 DIAGNOSIS — Z6841 Body Mass Index (BMI) 40.0 and over, adult: Secondary | ICD-10-CM | POA: Insufficient documentation

## 2013-08-17 DIAGNOSIS — N950 Postmenopausal bleeding: Secondary | ICD-10-CM | POA: Insufficient documentation

## 2013-08-17 DIAGNOSIS — N840 Polyp of corpus uteri: Secondary | ICD-10-CM | POA: Insufficient documentation

## 2013-08-17 DIAGNOSIS — J45909 Unspecified asthma, uncomplicated: Secondary | ICD-10-CM | POA: Insufficient documentation

## 2013-08-17 HISTORY — PX: DILATATION & CURETTAGE/HYSTEROSCOPY WITH TRUECLEAR: SHX6353

## 2013-08-17 SURGERY — DILATATION & CURETTAGE/HYSTEROSCOPY WITH TRUCLEAR
Anesthesia: General | Site: Vagina

## 2013-08-17 MED ORDER — LIDOCAINE HCL (CARDIAC) 20 MG/ML IV SOLN
INTRAVENOUS | Status: DC | PRN
  Administered 2013-08-17: 100 mg via INTRAVENOUS

## 2013-08-17 MED ORDER — KETOROLAC TROMETHAMINE 30 MG/ML IJ SOLN
INTRAMUSCULAR | Status: DC | PRN
  Administered 2013-08-17: 30 mg via INTRAVENOUS

## 2013-08-17 MED ORDER — FENTANYL CITRATE 0.05 MG/ML IJ SOLN
INTRAMUSCULAR | Status: AC
  Filled 2013-08-17: qty 5

## 2013-08-17 MED ORDER — LIDOCAINE-EPINEPHRINE 1 %-1:100000 IJ SOLN
INTRAMUSCULAR | Status: AC
  Filled 2013-08-17: qty 1

## 2013-08-17 MED ORDER — MIDAZOLAM HCL 2 MG/2ML IJ SOLN
INTRAMUSCULAR | Status: DC | PRN
  Administered 2013-08-17: 2 mg via INTRAVENOUS

## 2013-08-17 MED ORDER — SODIUM CHLORIDE 0.9 % IR SOLN
Status: DC | PRN
  Administered 2013-08-17: 3000 mL

## 2013-08-17 MED ORDER — PROPOFOL 10 MG/ML IV BOLUS
INTRAVENOUS | Status: DC | PRN
  Administered 2013-08-17: 200 mg via INTRAVENOUS
  Administered 2013-08-17: 25 mg via INTRAVENOUS

## 2013-08-17 MED ORDER — FENTANYL CITRATE 0.05 MG/ML IJ SOLN
INTRAMUSCULAR | Status: AC
  Administered 2013-08-17: 50 ug via INTRAVENOUS
  Filled 2013-08-17: qty 2

## 2013-08-17 MED ORDER — MIDAZOLAM HCL 2 MG/2ML IJ SOLN
INTRAMUSCULAR | Status: AC
  Filled 2013-08-17: qty 2

## 2013-08-17 MED ORDER — ONDANSETRON HCL 4 MG/2ML IJ SOLN
INTRAMUSCULAR | Status: DC | PRN
  Administered 2013-08-17: 4 mg via INTRAVENOUS

## 2013-08-17 MED ORDER — IBUPROFEN 800 MG PO TABS: 800.0000 mg | ORAL_TABLET | Freq: Three times a day (TID) | ORAL | Status: DC | PRN

## 2013-08-17 MED ORDER — FENTANYL CITRATE 0.05 MG/ML IJ SOLN
INTRAMUSCULAR | Status: DC | PRN
  Administered 2013-08-17: 100 ug via INTRAVENOUS

## 2013-08-17 MED ORDER — FENTANYL CITRATE 0.05 MG/ML IJ SOLN
25.0000 ug | INTRAMUSCULAR | Status: DC | PRN
  Administered 2013-08-17: 50 ug via INTRAVENOUS

## 2013-08-17 MED ORDER — LACTATED RINGERS IV SOLN
INTRAVENOUS | Status: DC
  Administered 2013-08-17 (×2): via INTRAVENOUS

## 2013-08-17 SURGICAL SUPPLY — 21 items
BLADE INCISOR TRUC PLUS 2.9 (ABLATOR) IMPLANT
CANISTERS HI-FLOW 3000CC (CANNISTER) ×3 IMPLANT
CATH ROBINSON RED A/P 16FR (CATHETERS) ×3 IMPLANT
CLOTH BEACON ORANGE TIMEOUT ST (SAFETY) ×3 IMPLANT
CONTAINER PREFILL 10% NBF 60ML (FORM) ×6 IMPLANT
DRAPE HYSTEROSCOPY (DRAPE) ×3 IMPLANT
DRSG TELFA 3X8 NADH (GAUZE/BANDAGES/DRESSINGS) ×3 IMPLANT
GLOVE BIO SURGEON STRL SZ8 (GLOVE) ×3 IMPLANT
GLOVE SURG ORTHO 8.0 STRL STRW (GLOVE) ×3 IMPLANT
GOWN STRL REUS W/TWL LRG LVL3 (GOWN DISPOSABLE) ×6 IMPLANT
INCISOR TRUC PLUS BLADE 2.9 (ABLATOR) ×3
KIT HYSTEROSCOPY TRUCLEAR (ABLATOR) ×3 IMPLANT
MORCELLATOR RECIP TRUCLEAR 4.0 (ABLATOR) IMPLANT
NDL SPNL 22GX3.5 QUINCKE BK (NEEDLE) ×1 IMPLANT
NEEDLE SPNL 22GX3.5 QUINCKE BK (NEEDLE) ×3 IMPLANT
PACK VAGINAL MINOR WOMEN LF (CUSTOM PROCEDURE TRAY) ×3 IMPLANT
PAD DRESSING TELFA 3X8 NADH (GAUZE/BANDAGES/DRESSINGS) ×1 IMPLANT
PAD OB MATERNITY 4.3X12.25 (PERSONAL CARE ITEMS) ×3 IMPLANT
SYR CONTROL 10ML LL (SYRINGE) ×3 IMPLANT
TOWEL OR 17X24 6PK STRL BLUE (TOWEL DISPOSABLE) ×6 IMPLANT
WATER STERILE IRR 1000ML POUR (IV SOLUTION) ×3 IMPLANT

## 2013-08-17 NOTE — Discharge Instructions (Signed)
DISCHARGE INSTRUCTIONS: D&C  The following instructions have been prepared to help you care for yourself upon your return home.  MAY TAKE IBUPROFEN (ADVIL, MOTRIN) OR ALEVE AFTER 2:15 PM FOR CRAMPS!!!   Personal hygiene:  Use sanitary pads for vaginal drainage, not tampons.  Shower the day after your procedure.  NO tub baths, pools or Jacuzzis for 2-3 weeks.  Wipe front to back after using the bathroom.  Activity and limitations:  Do NOT drive or operate any equipment for 24 hours. The effects of anesthesia are still present and drowsiness may result.  Do NOT rest in bed all day.  Walking is encouraged.  Walk up and down stairs slowly.  You may resume your normal activity in one to two days or as indicated by your physician.  Sexual activity: NO intercourse for at least 2 weeks after the procedure, or as indicated by your physician.  Diet: Eat a light meal as desired this evening. You may resume your usual diet tomorrow.  Return to work: You may resume your work activities in one to two days or as indicated by your doctor.  What to expect after your surgery: Expect to have vaginal bleeding/discharge for 2-3 days and spotting for up to 10 days. It is not unusual to have soreness for up to 1-2 weeks. You may have a slight burning sensation when you urinate for the first day. Mild cramps may continue for a couple of days. You may have a regular period in 2-6 weeks.  Call your doctor for any of the following:  Excessive vaginal bleeding, saturating and changing one pad every hour.  Inability to urinate 6 hours after discharge from hospital.  Pain not relieved by pain medication.  Fever of 100.4 F or greater.  Unusual vaginal discharge or odor.   Call for an appointment:    Patients signature: ______________________  Nurses signature ________________________  Support person's signature_______________________

## 2013-08-17 NOTE — Brief Op Note (Signed)
08/17/2013  8:19 AM  PATIENT:  [name]  53 y.o. female  PRE-OPERATIVE DIAGNOSIS:  PMB, EM MASS  POST-OPERATIVE DIAGNOSIS:  Post menopausal bleeding, endometrial mass  PROCEDURE:  Procedure(s): DILATATION & CURETTAGE/HYSTEROSCOPY WITH TRUCLEAR (N/A)  SURGEON:  Surgeon(s) and Role:    * Luz Lex, MD - Primary  PHYSICIAN ASSISTANT:   ASSISTANTS: none   ANESTHESIA:   none  EBL:     BLOOD ADMINISTERED:none  DRAINS: none   LOCAL MEDICATIONS USED:  NONE  SPECIMEN:  Source of Specimen:  endometrial mass  DISPOSITION OF SPECIMEN:  PATHOLOGY  COUNTS:  YES  TOURNIQUET:  * No tourniquets in log *  DICTATION: .Other Dictation: Dictation Number 1  PLAN OF CARE: Discharge to home after PACU  PATIENT DISPOSITION:  PACU - hemodynamically stable.   Delay start of Pharmacological VTE agent (>24hrs) due to surgical blood loss or risk of bleeding: no

## 2013-08-17 NOTE — Transfer of Care (Signed)
Immediate Anesthesia Transfer of Care Note  Patient: [name]  Procedure(s) Performed: Procedure(s): DILATATION & CURETTAGE/HYSTEROSCOPY WITH TRUCLEAR (N/A)  Patient Location: PACU  Anesthesia Type:General  Level of Consciousness: awake, alert  and oriented  Airway & Oxygen Therapy: Patient Spontanous Breathing and Patient connected to nasal cannula oxygen  Post-op Assessment: Report given to PACU RN and Post -op Vital signs reviewed and stable  Post vital signs: Reviewed and stable  Complications: No apparent anesthesia complications

## 2013-08-17 NOTE — Anesthesia Postprocedure Evaluation (Signed)
  Anesthesia Post-op Note  Patient: [name]  Procedure(s) Performed: Procedure(s): DILATATION & CURETTAGE/HYSTEROSCOPY WITH TRUCLEAR (N/A) Last Vitals:  Filed Vitals:   08/17/13 0915  BP: 101/51  Pulse: 57  Temp:   Resp: 14    Patient is awake and responsive. Pain and nausea are reasonably well controlled. Vital signs are stable and clinically acceptable. Oxygen saturation is clinically acceptable. There are no apparent anesthetic complications at this time. See MD in Rm icon [phone] Patient is ready for discharge.

## 2013-08-17 NOTE — Anesthesia Procedure Notes (Signed)
Procedure Name: LMA Insertion Date/Time: 08/17/2013 7:45 AM Performed by: Sayge Salvato, Sheron Nightingale Pre-anesthesia Checklist: Patient identified, Patient being monitored, Emergency Drugs available, Timeout performed and Suction available Patient Re-evaluated:Patient Re-evaluated prior to inductionOxygen Delivery Method: Circle system utilized Preoxygenation: Pre-oxygenation with 100% oxygen Intubation Type: IV induction Ventilation: Mask ventilation without difficulty LMA: LMA inserted LMA Size: 4.0 Number of attempts: 1 Airway Equipment and Method: Patient positioned with wedge pillow Placement Confirmation: positive ETCO2 and breath sounds checked- equal and bilateral ETT to lip (cm): at lips.

## 2013-08-17 NOTE — Anesthesia Preprocedure Evaluation (Signed)
Anesthesia Evaluation  Patient identified by MRN, date of birth, ID band Patient awake    Reviewed: Allergy & Precautions, H&P , Patient's Chart, lab work & pertinent test results, reviewed documented beta blocker date and time   Airway Mallampati: II TM Distance: >3 FB Neck ROM: full    Dental no notable dental hx.    Pulmonary asthma ,  breath sounds clear to auscultation  Pulmonary exam normal       Cardiovascular hypertension, Rhythm:regular Rate:Normal     Neuro/Psych    GI/Hepatic GERD-  ,  Endo/Other  Morbid obesity  Renal/GU      Musculoskeletal   Abdominal   Peds  Hematology   Anesthesia Other Findings Multiple colonoscopies; reports doing well with Propofol only  Reproductive/Obstetrics                           Anesthesia Physical Anesthesia Plan  ASA: III  Anesthesia Plan:    Post-op Pain Management:    Induction: Intravenous  Airway Management Planned: LMA  Additional Equipment:   Intra-op Plan:   Post-operative Plan:   Informed Consent: I have reviewed the patients History and Physical, chart, labs and discussed the procedure including the risks, benefits and alternatives for the proposed anesthesia with the patient or authorized representative who has indicated his/her understanding and acceptance.   Dental Advisory Given and Dental advisory given  Plan Discussed with: CRNA and Surgeon  Anesthesia Plan Comments: (Discussed GA with LMA, possible sore throat, potential need to switch to ETT, N/V, pulmonary aspiration. Questions answered. )        Anesthesia Quick Evaluation

## 2013-08-18 NOTE — Op Note (Signed)
NAMEFREDERIKA, HUKILL              ACCOUNT NO.:  [id number]  MEDICAL RECORD NO.:  [id number]  LOCATION:  [facility]                          FACILITY:  [facility]  PHYSICIAN:  Monia Sabal. Corinna Capra, M.D.    DATE OF BIRTH:  12-Jun-1960  DATE OF PROCEDURE:  08/17/2013 DATE OF DISCHARGE:  08/17/2013                              OPERATIVE REPORT   PREOPERATIVE DIAGNOSIS:  Postmenopausal bleeding and endometrial mass.  POSTOPERATIVE DIAGNOSIS:  Postmenopausal bleeding and endometrial mass.  PROCEDURE:  Hysteroscopy D and C, which were clear endometrial resection of pelvic masses.  SURGEON:  Monia Sabal. Corinna Capra, M.D.  ANESTHESIA:  General endotracheal.  INDICATIONS:  Ms. Teed is a 53 year old postmenopausal white female, abnormal uterine bleeding, underwent ultrasound showing echogenic mass within the endometrial cavity consistent with either a polyp or her small fibroid.  She presents for definitive surgical intervention.  Plan is hysteroscopic resection of this.  Risks and benefits were discussed. Informed consent was obtained.  FINDINGS AT TIME OF SURGERY:  The anterior wall mass near the right side of the fundus most because of the submucosal fibroid.  However, posterior wall mass consistent with polyps.  Normal-appearing ostia and cervix.  DESCRIPTION OF PROCEDURE:  After adequate anesthesia, the patient was placed in dorsal lithotomy position.  She is sterilely prepped and draped.  Bladder sterilely drained.  Graves speculum was placed.  A tenaculum placed in anterior lip of the cervix.  Uterus sounded to 7 cm, easily dilated up to 17 Pratt dilator.  Hysteroscope was inserted.  The above findings were noted.  Prior TRUCLEAR device was inserted and under direct visualization both of these masses were resected down and flush the endometrial wall with no evidence of residual fibroid or polyp remaining.  Normal-appearing endometrial cavity with normal-appearing ostia and cervix after the procedure.   Minimal bleeding was noted.  The hysteroscope was then removed.  Tenaculum was noted to be hemostatic. The saline deficit was 500 mL.  The patient received clindamycin and gentamicin preoperatively, 30 mg Toradol IV postoperatively and was stable on transfer to recovery room.  Sponge and instrument count was normal x3.  Estimated blood loss was minimal.  DISPOSITION:  The patient has been discharged home with routine instructions for D and C, told to return for increased pain, fever, or bleeding.  We will follow up in the office in 3 weeks for postop visit.     Monia Sabal Corinna Capra, M.D.     DCL/MEDQ  D:  08/17/2013  T:  08/18/2013  Job:  [id number]

## 2013-08-20 ENCOUNTER — Encounter (HOSPITAL_COMMUNITY): Payer: Self-pay | Admitting: Obstetrics and Gynecology

## 2013-08-27 ENCOUNTER — Ambulatory Visit: Admit: 2013-08-27 | Payer: Self-pay | Admitting: Obstetrics and Gynecology

## 2013-08-27 SURGERY — DILATATION & CURETTAGE/HYSTEROSCOPY WITH TRUCLEAR
Anesthesia: Choice

## 2014-03-05 ENCOUNTER — Other Ambulatory Visit: Payer: Self-pay

## 2014-03-05 DIAGNOSIS — Z1231 Encounter for screening mammogram for malignant neoplasm of breast: Secondary | ICD-10-CM

## 2014-04-01 ENCOUNTER — Other Ambulatory Visit: Payer: Self-pay | Admitting: Dermatology

## 2014-05-23 ENCOUNTER — Other Ambulatory Visit: Payer: Self-pay | Admitting: Obstetrics and Gynecology

## 2014-05-23 ENCOUNTER — Ambulatory Visit
Admission: RE | Admit: 2014-05-23 | Discharge: 2014-05-23 | Disposition: A | Discharge status: Home / Self Care | Payer: Medicare HMO | Source: Ambulatory Visit

## 2014-05-23 DIAGNOSIS — Z1231 Encounter for screening mammogram for malignant neoplasm of breast: Secondary | ICD-10-CM

## 2014-05-24 LAB — CYTOLOGY - PAP

## 2015-02-05 NOTE — Patient Instructions (Addendum)
Your procedure is scheduled on:  Tuesday, Nov. 1, 2016  Enter through the Main Entrance of [facility] at: 6:00 a.m.  Pick up the phone at the desk and dial [phone].  Call this number if you have problems the morning of surgery: [phone].  Remember: Do NOT eat food or drink after:  MIDNIGHT MONDAY Take these medicines the morning of surgery with a SIP OF WATER:  AMLODIPINE, TRIAMTERENE-HYRDOCHLOROTHIAZIDE, ZANTAC, ZOLOFT, XANAX IF NEEDED  *BRING ASTHMA INHALERS DAY OF SURGERY  *STOP TAKING FISH OIL AT THIS TIME  Do NOT wear jewelry (body piercing), metal hair clips/bobby pins, make-up, or nail polish. Do NOT wear lotions, powders, or perfumes.  You may wear deoderant. Do NOT shave for 48 hours prior to surgery. Do NOT bring valuables to the hospital. Contacts, dentures, or bridgework may not be worn into surgery. Leave suitcase in car.  After surgery it may be brought to your room.  For patients admitted to the hospital, checkout time is 11:00 AM the day of discharge.

## 2015-02-06 ENCOUNTER — Other Ambulatory Visit: Payer: Self-pay

## 2015-02-06 ENCOUNTER — Encounter (HOSPITAL_COMMUNITY)
Admission: RE | Admit: 2015-02-06 | Discharge: 2015-02-06 | Disposition: A | Discharge status: Home / Self Care | Payer: Medicare HMO | Source: Ambulatory Visit | Attending: Obstetrics and Gynecology | Admitting: Obstetrics and Gynecology

## 2015-02-06 ENCOUNTER — Encounter (HOSPITAL_COMMUNITY): Payer: Self-pay

## 2015-02-06 DIAGNOSIS — N83202 Unspecified ovarian cyst, left side: Secondary | ICD-10-CM | POA: Diagnosis not present

## 2015-02-06 DIAGNOSIS — D251 Intramural leiomyoma of uterus: Secondary | ICD-10-CM | POA: Insufficient documentation

## 2015-02-06 DIAGNOSIS — Z01818 Encounter for other preprocedural examination: Secondary | ICD-10-CM | POA: Diagnosis not present

## 2015-02-06 LAB — BASIC METABOLIC PANEL
ANION GAP: 8 (ref 5–15)
BUN: 25 mg/dL — ABNORMAL HIGH (ref 6–20)
CHLORIDE: 100 mmol/L — AB (ref 101–111)
CO2: 29 mmol/L (ref 22–32)
Calcium: 8.9 mg/dL (ref 8.9–10.3)
Creatinine, Ser: 1.12 mg/dL — ABNORMAL HIGH (ref 0.44–1.00)
GFR calc non Af Amer: 55 mL/min — ABNORMAL LOW (ref >60)
Glucose, Bld: 82 mg/dL (ref 65–99)
POTASSIUM: 3.4 mmol/L — AB (ref 3.5–5.1)
SODIUM: 137 mmol/L (ref 135–145)

## 2015-02-06 LAB — CBC
HCT: 39.4 % (ref 36.0–46.0)
HEMOGLOBIN: 12.8 g/dL (ref 12.0–15.0)
MCH: 28.4 pg (ref 26.0–34.0)
MCHC: 32.5 g/dL (ref 30.0–36.0)
MCV: 87.4 fL (ref 78.0–100.0)
PLATELETS: 287 10*3/uL (ref 150–400)
RBC: 4.51 MIL/uL (ref 3.87–5.11)
RDW: 14.3 % (ref 11.5–15.5)
WBC: 5.7 10*3/uL (ref 4.0–10.5)

## 2015-02-06 NOTE — Pre-Procedure Instructions (Signed)
Patient had abnormal EKG today at PAT. Dr. Jillyn Hidden reviewed and no new orders were received.

## 2015-02-10 MED ORDER — CEFOTETAN DISODIUM 2 G IJ SOLR
2.0000 g | INTRAMUSCULAR | Status: AC
  Administered 2015-02-11: 2 g via INTRAVENOUS
  Filled 2015-02-10: qty 2

## 2015-02-10 NOTE — H&P (Addendum)
   [name] presents today for pre-op evaluation for hysterectomy.  She and her husband present today for discussion.  [name] has a history of uterine fibroids.  She had a D&C last year for abnormal bleeding, which was benign.  Last year, there was no evidence of any type of pelvic mass.  In June, she had a CAT scan showing a 6 cm, left, adnexal mass consistent with an ovarian cyst.  Had a followup ultrasound in September, it showed some slight increase in size at 6.9 cm, no septations or solid areas.  She did have a normal CA-125 and it was not a complex cyst.  She elected to follow up on 01/23/2015, the cyst had enlarged in size to 9.7 x 7.0 x 6.4 cm.  Normal flow to the ovary, no flow to the cyst, and no free fluid noted.  The right ovary appears to be normal.  Still multiple fibroids noted, no change.  Endometrial thickness is 3.9 mm.  Due to the increasing size and now with pelvic pressure, recommend surgical evaluation and removal of this ovarian cyst.  Because of her postmenopausal nature and fibroids, would recommend a total abdominal hysterectomy with bilateral salpingo-oophorectomy.  Would recommend necessary staging procedures if this does appear to be cancerous or precancerous.  Possible staging procedures that are necessary.  All of her questions were answered with regards to this.    O:  Again, ultrasound 9.7 x 7.0 x 6.4 cm.  Heart is regular rate and rhythm.  Lungs are clear to auscultation bilaterally.  Abdomen morbidly obese.  She does have a large umbilical hernia.  Uterus is enlarged, but difficult to palpate due to her large size.  She has had normal Pap smears recently.   PAST MEDICAL HISTORY:  Significant for arthritis, osteoporosis.  She has a history of melanoma, asthma, irritable bowel syndrome, anxiety.  MEDICATIONS:  See current list in Epic.   ALLERGIES:  She gives an allergy to codeine, but that states that was really more nausea.  She thinks she has done oxycodone in the past  without problems.    A:   Postmenopausal ovarian cyst increasing in size, now 9 cm.  Pelvic pain and discomfort.  Uterine fibroids.   P:  Total abdominal hysterectomy with bilateral salpingo-oophorectomy.  Possible omentectomy and/or indicated procedures.  Discussed the procedure at length, its risks, its benefits, its pros, its cons.  The risks associated with anesthesia, with blood loss.  Discussed the recovery at length.  All of their questions were answered.  She has given informed consent.  I did spend nearly an hour today in consultation, review, and discussion with the patient and her husband with regards to this procedure.   Louretta Shorten, MD/   02/11/15 0715 This patient has been seen and examined.   All of her questions were answered.  Labs and vital signs reviewed.  Informed consent has been obtained.  The History and Physical is current. DL

## 2015-02-11 ENCOUNTER — Inpatient Hospital Stay (HOSPITAL_COMMUNITY): Payer: Medicare HMO | Admitting: Anesthesiology

## 2015-02-11 ENCOUNTER — Encounter (HOSPITAL_COMMUNITY)
Admission: RE | Disposition: A | Discharge status: Home / Self Care | Payer: Self-pay | Source: Ambulatory Visit | Attending: Obstetrics and Gynecology

## 2015-02-11 ENCOUNTER — Inpatient Hospital Stay (HOSPITAL_COMMUNITY)
Admission: RE | Admit: 2015-02-11 | Discharge: 2015-02-14 | DRG: 738 | Disposition: A | Discharge status: Home / Self Care | Payer: Medicare HMO | Source: Ambulatory Visit | Attending: Obstetrics and Gynecology | Admitting: Obstetrics and Gynecology

## 2015-02-11 ENCOUNTER — Encounter (HOSPITAL_COMMUNITY): Payer: Self-pay

## 2015-02-11 DIAGNOSIS — J45909 Unspecified asthma, uncomplicated: Secondary | ICD-10-CM | POA: Diagnosis present

## 2015-02-11 DIAGNOSIS — C569 Malignant neoplasm of unspecified ovary: Secondary | ICD-10-CM | POA: Diagnosis present

## 2015-02-11 DIAGNOSIS — D259 Leiomyoma of uterus, unspecified: Secondary | ICD-10-CM | POA: Diagnosis present

## 2015-02-11 DIAGNOSIS — F419 Anxiety disorder, unspecified: Secondary | ICD-10-CM | POA: Diagnosis present

## 2015-02-11 DIAGNOSIS — K429 Umbilical hernia without obstruction or gangrene: Secondary | ICD-10-CM | POA: Diagnosis present

## 2015-02-11 DIAGNOSIS — M810 Age-related osteoporosis without current pathological fracture: Secondary | ICD-10-CM | POA: Diagnosis present

## 2015-02-11 DIAGNOSIS — C562 Malignant neoplasm of left ovary: Secondary | ICD-10-CM | POA: Diagnosis present

## 2015-02-11 DIAGNOSIS — K589 Irritable bowel syndrome without diarrhea: Secondary | ICD-10-CM | POA: Diagnosis present

## 2015-02-11 DIAGNOSIS — Z85820 Personal history of malignant melanoma of skin: Secondary | ICD-10-CM | POA: Diagnosis not present

## 2015-02-11 HISTORY — PX: ABDOMINAL HYSTERECTOMY: SHX81

## 2015-02-11 HISTORY — PX: OMENTECTOMY: SHX5985

## 2015-02-11 LAB — CBC
HCT: 34.4 % — ABNORMAL LOW (ref 36.0–46.0)
Hemoglobin: 10.9 g/dL — ABNORMAL LOW (ref 12.0–15.0)
MCH: 27.8 pg (ref 26.0–34.0)
MCHC: 31.7 g/dL (ref 30.0–36.0)
MCV: 87.8 fL (ref 78.0–100.0)
PLATELETS: 251 10*3/uL (ref 150–400)
RBC: 3.92 MIL/uL (ref 3.87–5.11)
RDW: 14.2 % (ref 11.5–15.5)
WBC: 9 10*3/uL (ref 4.0–10.5)

## 2015-02-11 LAB — TYPE AND SCREEN
ABO/RH(D): O POS
Antibody Screen: NEGATIVE

## 2015-02-11 LAB — ABO/RH: ABO/RH(D): O POS

## 2015-02-11 SURGERY — HYSTERECTOMY, ABDOMINAL
Anesthesia: General

## 2015-02-11 MED ORDER — MIDAZOLAM HCL 2 MG/2ML IJ SOLN
INTRAMUSCULAR | Status: AC
  Filled 2015-02-11: qty 4

## 2015-02-11 MED ORDER — GLYCOPYRROLATE 0.2 MG/ML IJ SOLN
INTRAMUSCULAR | Status: AC
Start: 2015-02-11 — End: 2015-02-11
  Filled 2015-02-11: qty 4

## 2015-02-11 MED ORDER — SERTRALINE HCL 50 MG PO TABS
50.0000 mg | ORAL_TABLET | Freq: Every day | ORAL | Status: DC
  Administered 2015-02-12 – 2015-02-14 (×3): 50 mg via ORAL
  Filled 2015-02-11 (×4): qty 1

## 2015-02-11 MED ORDER — FENTANYL CITRATE (PF) 250 MCG/5ML IJ SOLN
INTRAMUSCULAR | Status: AC
  Filled 2015-02-11: qty 25

## 2015-02-11 MED ORDER — STERILE WATER FOR IRRIGATION IR SOLN
Status: DC | PRN
  Administered 2015-02-11: 1000 mL via INTRAVESICAL

## 2015-02-11 MED ORDER — DEXAMETHASONE SODIUM PHOSPHATE 10 MG/ML IJ SOLN
INTRAMUSCULAR | Status: AC
  Filled 2015-02-11: qty 1

## 2015-02-11 MED ORDER — HYPROMELLOSE (GONIOSCOPIC) 2.5 % OP SOLN: 2.0000 [drp] | OPHTHALMIC | Status: DC | PRN

## 2015-02-11 MED ORDER — OXYCODONE-ACETAMINOPHEN 5-325 MG PO TABS
1.0000 | ORAL_TABLET | ORAL | Status: DC | PRN
  Administered 2015-02-12 (×2): 1 via ORAL
  Administered 2015-02-12: 2 via ORAL
  Administered 2015-02-12 – 2015-02-14 (×7): 1 via ORAL
  Filled 2015-02-11 (×7): qty 1
  Filled 2015-02-11: qty 2
  Filled 2015-02-11 (×2): qty 1

## 2015-02-11 MED ORDER — ONDANSETRON HCL 4 MG/2ML IJ SOLN: 4.0000 mg | Freq: Four times a day (QID) | INTRAMUSCULAR | Status: DC | PRN

## 2015-02-11 MED ORDER — BUDESONIDE-FORMOTEROL FUMARATE 160-4.5 MCG/ACT IN AERO
2.0000 | INHALATION_SPRAY | Freq: Two times a day (BID) | RESPIRATORY_TRACT | Status: DC
  Administered 2015-02-11 – 2015-02-14 (×6): 2 via RESPIRATORY_TRACT
  Filled 2015-02-11 (×3): qty 6

## 2015-02-11 MED ORDER — IBUPROFEN 600 MG PO TABS
600.0000 mg | ORAL_TABLET | Freq: Four times a day (QID) | ORAL | Status: DC | PRN
  Administered 2015-02-12 – 2015-02-14 (×5): 600 mg via ORAL
  Filled 2015-02-11 (×6): qty 1

## 2015-02-11 MED ORDER — HYDROMORPHONE HCL 1 MG/ML IJ SOLN
INTRAMUSCULAR | Status: AC
  Filled 2015-02-11: qty 1

## 2015-02-11 MED ORDER — LIDOCAINE HCL (CARDIAC) 20 MG/ML IV SOLN
INTRAVENOUS | Status: AC
  Filled 2015-02-11: qty 5

## 2015-02-11 MED ORDER — FENTANYL CITRATE (PF) 100 MCG/2ML IJ SOLN
INTRAMUSCULAR | Status: AC
  Filled 2015-02-11: qty 2

## 2015-02-11 MED ORDER — ROCURONIUM BROMIDE 100 MG/10ML IV SOLN
INTRAVENOUS | Status: AC
  Filled 2015-02-11: qty 1

## 2015-02-11 MED ORDER — ALPRAZOLAM 0.5 MG PO TABS: 2.0000 mg | ORAL_TABLET | Freq: Two times a day (BID) | ORAL | Status: DC

## 2015-02-11 MED ORDER — FENTANYL CITRATE (PF) 100 MCG/2ML IJ SOLN
INTRAMUSCULAR | Status: DC | PRN
  Administered 2015-02-11: 100 ug via INTRAVENOUS
  Administered 2015-02-11: 50 ug via INTRAVENOUS
  Administered 2015-02-11: 100 ug via INTRAVENOUS

## 2015-02-11 MED ORDER — MENTHOL 3 MG MT LOZG: 1.0000 | LOZENGE | OROMUCOSAL | Status: DC | PRN

## 2015-02-11 MED ORDER — SUCCINYLCHOLINE CHLORIDE 20 MG/ML IJ SOLN
INTRAMUSCULAR | Status: DC | PRN
  Administered 2015-02-11: 120 mg via INTRAVENOUS

## 2015-02-11 MED ORDER — SODIUM CHLORIDE 0.9 % IJ SOLN: 9.0000 mL | INTRAMUSCULAR | Status: DC | PRN

## 2015-02-11 MED ORDER — NALOXONE HCL 0.4 MG/ML IJ SOLN
0.4000 mg | INTRAMUSCULAR | Status: DC | PRN
Start: 2015-02-11 — End: 2015-02-12

## 2015-02-11 MED ORDER — ROCURONIUM BROMIDE 100 MG/10ML IV SOLN
INTRAVENOUS | Status: DC | PRN
  Administered 2015-02-11: 30 mg via INTRAVENOUS
  Administered 2015-02-11 (×2): 10 mg via INTRAVENOUS

## 2015-02-11 MED ORDER — ONDANSETRON HCL 4 MG/2ML IJ SOLN
INTRAMUSCULAR | Status: AC
  Filled 2015-02-11: qty 2

## 2015-02-11 MED ORDER — HEPARIN SODIUM (PORCINE) 5000 UNIT/ML IJ SOLN
INTRAMUSCULAR | Status: AC
  Filled 2015-02-11: qty 1

## 2015-02-11 MED ORDER — HEPARIN SODIUM (PORCINE) 5000 UNIT/ML IJ SOLN
INTRAMUSCULAR | Status: DC | PRN
  Administered 2015-02-11: 5000 [IU] via SUBCUTANEOUS

## 2015-02-11 MED ORDER — ALPRAZOLAM 0.5 MG PO TABS
2.0000 mg | ORAL_TABLET | Freq: Every day | ORAL | Status: DC
  Administered 2015-02-11 – 2015-02-13 (×3): 2 mg via ORAL
  Filled 2015-02-11 (×3): qty 4

## 2015-02-11 MED ORDER — DIPHENHYDRAMINE HCL 50 MG/ML IJ SOLN: 12.5000 mg | Freq: Four times a day (QID) | INTRAMUSCULAR | Status: DC | PRN

## 2015-02-11 MED ORDER — SCOPOLAMINE 1 MG/3DAYS TD PT72
MEDICATED_PATCH | TRANSDERMAL | Status: AC
  Filled 2015-02-11: qty 1

## 2015-02-11 MED ORDER — ALBUTEROL SULFATE (2.5 MG/3ML) 0.083% IN NEBU
3.0000 mL | INHALATION_SOLUTION | RESPIRATORY_TRACT | Status: DC | PRN
  Administered 2015-02-13: 3 mL via RESPIRATORY_TRACT
  Filled 2015-02-11: qty 3

## 2015-02-11 MED ORDER — HYDROMORPHONE HCL 1 MG/ML IJ SOLN: 0.2000 mg | INTRAMUSCULAR | Status: DC | PRN

## 2015-02-11 MED ORDER — AMLODIPINE BESYLATE 2.5 MG PO TABS
2.5000 mg | ORAL_TABLET | Freq: Every day | ORAL | Status: DC
  Administered 2015-02-13: 5 mg via ORAL
  Filled 2015-02-11 (×4): qty 1

## 2015-02-11 MED ORDER — PROMETHAZINE HCL 25 MG/ML IJ SOLN
INTRAMUSCULAR | Status: AC
  Filled 2015-02-11: qty 1

## 2015-02-11 MED ORDER — DEXAMETHASONE SODIUM PHOSPHATE 10 MG/ML IJ SOLN
INTRAMUSCULAR | Status: DC | PRN
  Administered 2015-02-11: 10 mg via INTRAVENOUS

## 2015-02-11 MED ORDER — DEXTROSE-NACL 5-0.45 % IV SOLN
INTRAVENOUS | Status: DC
Start: 2015-02-11 — End: 2015-02-14
  Administered 2015-02-11 – 2015-02-12 (×2): via INTRAVENOUS

## 2015-02-11 MED ORDER — DIPHENHYDRAMINE HCL 12.5 MG/5ML PO ELIX: 12.5000 mg | ORAL_SOLUTION | Freq: Four times a day (QID) | ORAL | Status: DC | PRN

## 2015-02-11 MED ORDER — LACTATED RINGERS IV SOLN
INTRAVENOUS | Status: DC
Start: 2015-02-11 — End: 2015-02-11
  Administered 2015-02-11: 08:00:00 via INTRAVENOUS
  Administered 2015-02-11: 125 mL/h via INTRAVENOUS
  Administered 2015-02-11: 09:00:00 via INTRAVENOUS

## 2015-02-11 MED ORDER — MIDAZOLAM HCL 5 MG/5ML IJ SOLN
INTRAMUSCULAR | Status: DC | PRN
  Administered 2015-02-11: 2 mg via INTRAVENOUS

## 2015-02-11 MED ORDER — MEPERIDINE HCL 25 MG/ML IJ SOLN: 6.2500 mg | INTRAMUSCULAR | Status: DC | PRN

## 2015-02-11 MED ORDER — FENTANYL CITRATE (PF) 100 MCG/2ML IJ SOLN
25.0000 ug | INTRAMUSCULAR | Status: DC | PRN
  Administered 2015-02-11: 50 ug via INTRAVENOUS
  Administered 2015-02-11 (×2): 25 ug via INTRAVENOUS

## 2015-02-11 MED ORDER — PROPOFOL 10 MG/ML IV BOLUS
INTRAVENOUS | Status: DC | PRN
  Administered 2015-02-11: 200 mg via INTRAVENOUS

## 2015-02-11 MED ORDER — ONDANSETRON HCL 4 MG/2ML IJ SOLN
INTRAMUSCULAR | Status: DC | PRN
Start: 2015-02-11 — End: 2015-02-11
  Administered 2015-02-11: 4 mg via INTRAVENOUS

## 2015-02-11 MED ORDER — HYDROMORPHONE HCL 1 MG/ML IJ SOLN
1.0000 mg | Freq: Once | INTRAMUSCULAR | Status: AC
  Administered 2015-02-11: 1 mg via INTRAVENOUS

## 2015-02-11 MED ORDER — KETOROLAC TROMETHAMINE 30 MG/ML IJ SOLN: 30.0000 mg | Freq: Once | INTRAMUSCULAR | Status: DC | PRN

## 2015-02-11 MED ORDER — SCOPOLAMINE 1 MG/3DAYS TD PT72
1.0000 | MEDICATED_PATCH | Freq: Once | TRANSDERMAL | Status: DC
Start: 2015-02-11 — End: 2015-02-11
  Administered 2015-02-11: 1.5 mg via TRANSDERMAL

## 2015-02-11 MED ORDER — MONTELUKAST SODIUM 10 MG PO TABS
10.0000 mg | ORAL_TABLET | Freq: Every day | ORAL | Status: DC
  Administered 2015-02-11 – 2015-02-13 (×3): 10 mg via ORAL
  Filled 2015-02-11 (×4): qty 1

## 2015-02-11 MED ORDER — ALPRAZOLAM 0.5 MG PO TABS
1.0000 mg | ORAL_TABLET | Freq: Every day | ORAL | Status: DC
  Administered 2015-02-12 – 2015-02-13 (×2): 1 mg via ORAL
  Filled 2015-02-11 (×4): qty 2

## 2015-02-11 MED ORDER — SUCCINYLCHOLINE CHLORIDE 20 MG/ML IJ SOLN
INTRAMUSCULAR | Status: AC
  Filled 2015-02-11: qty 1

## 2015-02-11 MED ORDER — NEOSTIGMINE METHYLSULFATE 10 MG/10ML IV SOLN
INTRAVENOUS | Status: AC
  Filled 2015-02-11: qty 1

## 2015-02-11 MED ORDER — ORPHENADRINE CITRATE ER 100 MG PO TB12
100.0000 mg | ORAL_TABLET | Freq: Two times a day (BID) | ORAL | Status: DC | PRN
  Filled 2015-02-11: qty 1

## 2015-02-11 MED ORDER — PROMETHAZINE HCL 25 MG/ML IJ SOLN
6.2500 mg | INTRAMUSCULAR | Status: DC | PRN
  Administered 2015-02-11: 6.25 mg via INTRAVENOUS

## 2015-02-11 MED ORDER — PROPOFOL 10 MG/ML IV BOLUS
INTRAVENOUS | Status: AC
  Filled 2015-02-11: qty 20

## 2015-02-11 MED ORDER — NEOSTIGMINE METHYLSULFATE 10 MG/10ML IV SOLN
INTRAVENOUS | Status: DC | PRN
  Administered 2015-02-11: 5 mg via INTRAVENOUS

## 2015-02-11 MED ORDER — TRIAMTERENE-HCTZ 75-50 MG PO TABS
1.0000 | ORAL_TABLET | Freq: Every day | ORAL | Status: DC
  Administered 2015-02-12: 1 via ORAL
  Filled 2015-02-11 (×4): qty 1

## 2015-02-11 MED ORDER — HYDROMORPHONE 1 MG/ML IV SOLN
INTRAVENOUS | Status: DC
  Administered 2015-02-11: 0.8 mg via INTRAVENOUS
  Administered 2015-02-11: 12:00:00 via INTRAVENOUS
  Administered 2015-02-11: 1.6 mg via INTRAVENOUS
  Administered 2015-02-11: 0.3 mg via INTRAVENOUS
  Administered 2015-02-12: 1.2 mg via INTRAVENOUS
  Administered 2015-02-12: 0.6 mg via INTRAVENOUS
  Administered 2015-02-12: 1 mg via INTRAVENOUS
  Filled 2015-02-11: qty 25

## 2015-02-11 MED ORDER — GLYCOPYRROLATE 0.2 MG/ML IJ SOLN
INTRAMUSCULAR | Status: DC | PRN
  Administered 2015-02-11: .8 mg via INTRAVENOUS

## 2015-02-11 MED ORDER — HYDROMORPHONE HCL 1 MG/ML IJ SOLN
1.0000 mg | INTRAMUSCULAR | Status: DC | PRN
  Administered 2015-02-11: 1 mg via INTRAVENOUS

## 2015-02-11 MED ORDER — LIDOCAINE HCL (CARDIAC) 20 MG/ML IV SOLN
INTRAVENOUS | Status: DC | PRN
  Administered 2015-02-11: 100 mg via INTRAVENOUS

## 2015-02-11 SURGICAL SUPPLY — 49 items
CANISTER SUCT 3000ML (MISCELLANEOUS) ×4 IMPLANT
CELLS DAT CNTRL 66122 CELL SVR (MISCELLANEOUS) IMPLANT
CLOSURE WOUND 1/2 X4 (GAUZE/BANDAGES/DRESSINGS)
CLOTH BEACON ORANGE TIMEOUT ST (SAFETY) ×4 IMPLANT
DECANTER SPIKE VIAL GLASS SM (MISCELLANEOUS) IMPLANT
DRAPE WARM FLUID 44X44 (DRAPE) IMPLANT
DRSG OPSITE POSTOP 4X10 (GAUZE/BANDAGES/DRESSINGS) ×4 IMPLANT
DURAPREP 26ML APPLICATOR (WOUND CARE) ×4 IMPLANT
ELECT BLADE 6 FLAT ULTRCLN (ELECTRODE) ×2 IMPLANT
ELECT LIGASURE LONG (ELECTRODE) ×4 IMPLANT
GAUZE SPONGE 4X4 16PLY XRAY LF (GAUZE/BANDAGES/DRESSINGS) IMPLANT
GLOVE BIO SURGEON STRL SZ8 (GLOVE) ×4 IMPLANT
GLOVE BIOGEL PI IND STRL 7.0 (GLOVE) ×4 IMPLANT
GLOVE BIOGEL PI INDICATOR 7.0 (GLOVE) ×4
GLOVE SURG ORTHO 8.0 STRL STRW (GLOVE) ×4 IMPLANT
GOWN STRL REUS W/TWL LRG LVL3 (GOWN DISPOSABLE) ×8 IMPLANT
HOVERMATT SINGLE USE (MISCELLANEOUS) ×2 IMPLANT
NEEDLE HYPO 22GX1.5 SAFETY (NEEDLE) IMPLANT
NS IRRIG 1000ML POUR BTL (IV SOLUTION) ×6 IMPLANT
PACK ABDOMINAL GYN (CUSTOM PROCEDURE TRAY) ×4 IMPLANT
PAD OB MATERNITY 4.3X12.25 (PERSONAL CARE ITEMS) ×4 IMPLANT
PENCIL SMOKE EVAC W/HOLSTER (ELECTROSURGICAL) ×2 IMPLANT
PROTECTOR NERVE ULNAR (MISCELLANEOUS) ×6 IMPLANT
RETRACTOR WND ALEXIS 18 MED (MISCELLANEOUS) IMPLANT
RETRACTOR WND ALEXIS 25 LRG (MISCELLANEOUS) IMPLANT
RTRCTR WOUND ALEXIS 18CM MED (MISCELLANEOUS)
RTRCTR WOUND ALEXIS 25CM LRG (MISCELLANEOUS) ×4
SPONGE LAP 18X18 X RAY DECT (DISPOSABLE) ×8 IMPLANT
STAPLER VISISTAT 35W (STAPLE) ×4 IMPLANT
STRIP CLOSURE SKIN 1/2X4 (GAUZE/BANDAGES/DRESSINGS) IMPLANT
SUT CHROMIC 3 0 SH 27 (SUTURE) IMPLANT
SUT MNCRL 0 MO-4 VIOLET 18 CR (SUTURE) ×2 IMPLANT
SUT MNCRL 0 VIOLET 6X18 (SUTURE) ×2 IMPLANT
SUT MNCRL AB 3-0 PS2 27 (SUTURE) ×2 IMPLANT
SUT MONOCRYL 0 6X18 (SUTURE) ×2
SUT MONOCRYL 0 MO 4 18  CR/8 (SUTURE) ×4
SUT PDS AB 0 CT1 27 (SUTURE) IMPLANT
SUT PDS AB 0 CTX 60 (SUTURE) ×2 IMPLANT
SUT PLAIN 2 0 XLH (SUTURE) ×2 IMPLANT
SUT VIC AB 0 CT1 18XCR BRD8 (SUTURE) IMPLANT
SUT VIC AB 0 CT1 8-18 (SUTURE)
SUT VIC AB 1 CTX 36 (SUTURE)
SUT VIC AB 1 CTX36XBRD ANBCTRL (SUTURE) IMPLANT
SUT VIC AB 2-0 CT1 (SUTURE) ×4 IMPLANT
SYR BULB IRRIGATION 50ML (SYRINGE) ×2 IMPLANT
SYR CONTROL 10ML LL (SYRINGE) IMPLANT
TOWEL OR 17X24 6PK STRL BLUE (TOWEL DISPOSABLE) ×8 IMPLANT
TRAY FOLEY CATH SILVER 14FR (SET/KITS/TRAYS/PACK) ×4 IMPLANT
WATER STERILE IRR 1000ML POUR (IV SOLUTION) ×2 IMPLANT

## 2015-02-11 NOTE — Transfer of Care (Signed)
Immediate Anesthesia Transfer of Care Note  Patient: [name]  Procedure(s) Performed: Procedure(s): HYSTERECTOMY ABDOMINAL, bilateral salpingo-oophorectomy (Bilateral) OMENTECTOMY (N/A)  Patient Location: PACU  Anesthesia Type:General  Level of Consciousness: awake and alert   Airway & Oxygen Therapy: Patient Spontanous Breathing and Patient connected to nasal cannula oxygen  Post-op Assessment: Report given to RN and Post -op Vital signs reviewed and stable  Post vital signs: Reviewed and stable  Last Vitals:  Filed Vitals:   02/11/15 0642  BP: 138/72  Pulse: 71  Temp: 36.9 C  Resp: 20    Complications: No apparent anesthesia complications

## 2015-02-11 NOTE — Brief Op Note (Signed)
02/11/2015  9:20 AM  PATIENT:  [name]  54 y.o. female  PRE-OPERATIVE DIAGNOSIS:  10cm left ovarian mass, uterine fibroids  POST-OPERATIVE DIAGNOSIS:  10cm left ovarian mass, uterine fibroids  PROCEDURE:  Procedure(s): HYSTERECTOMY ABDOMINAL, bilateral salpingo-oophorectomy (Bilateral) OMENTECTOMY (N/A)  SURGEON:  Surgeon(s) and Role:    * Louretta Shorten, MD - Primary    * Molli Posey, MD - Assisting  PHYSICIAN ASSISTANT:   ASSISTANTS: [place]   ANESTHESIA:   general  EBL:  Total I/O In: 2300 [I.V.:2300] Out: 700 [Urine:200; Blood:500]  BLOOD ADMINISTERED:none  DRAINS: Urinary Catheter (Foley)   LOCAL MEDICATIONS USED:  NONE  SPECIMEN:  Source of Specimen:  Pelvic washings, left ovary and tube, right tube and ovary with uterus, omentum  DISPOSITION OF SPECIMEN:  PATHOLOGY  COUNTS:  YES  TOURNIQUET:  * No tourniquets in log *  DICTATION: .Other Dictation: Dictation Number 1  PLAN OF CARE: Admit to inpatient   PATIENT DISPOSITION:  PACU - hemodynamically stable.   Delay start of Pharmacological VTE agent (>24hrs) due to surgical blood loss or risk of bleeding: not applicable

## 2015-02-11 NOTE — Anesthesia Postprocedure Evaluation (Signed)
Anesthesia Post Note  Patient: [name]  Procedure(s) Performed: Procedure(s) (LRB): HYSTERECTOMY ABDOMINAL, bilateral salpingo-oophorectomy (Bilateral) OMENTECTOMY (N/A)  Anesthesia type: General  Patient location: PACU  Post pain: Pain level controlled  Post assessment: Post-op Vital signs reviewed  Last Vitals:  Filed Vitals:   02/11/15 1145  BP:   Pulse: 68  Temp: 36.6 C  Resp: 12    Post vital signs: Reviewed  Level of consciousness: sedated  Complications: No apparent anesthesia complications

## 2015-02-11 NOTE — Anesthesia Preprocedure Evaluation (Signed)
Anesthesia Evaluation    Reviewed: Allergy & Precautions, H&P , Patient's Chart, lab work & pertinent test results, reviewed documented beta blocker date and time   Airway Mallampati: I  TM Distance: >3 FB Neck ROM: full    Dental no notable dental hx. (+) Teeth Intact   Pulmonary    Pulmonary exam normal        Cardiovascular hypertension, negative cardio ROS Normal cardiovascular exam     Neuro/Psych negative neurological ROS  negative psych ROS   GI/Hepatic Neg liver ROS,   Endo/Other  negative endocrine ROS  Renal/GU negative Renal ROS     Musculoskeletal   Abdominal Normal abdominal exam  (+)   Peds  Hematology negative hematology ROS (+)   Anesthesia Other Findings   Reproductive/Obstetrics negative OB ROS                             Anesthesia Physical Anesthesia Plan  ASA: III  Anesthesia Plan: General   Post-op Pain Management:    Induction: Intravenous  Airway Management Planned: Oral ETT  Additional Equipment:   Intra-op Plan:   Post-operative Plan: Extubation in OR  Informed Consent: I have reviewed the patients History and Physical, chart, labs and discussed the procedure including the risks, benefits and alternatives for the proposed anesthesia with the patient or authorized representative who has indicated his/her understanding and acceptance.   Dental Advisory Given  Plan Discussed with: CRNA and Surgeon  Anesthesia Plan Comments:         Anesthesia Quick Evaluation

## 2015-02-11 NOTE — Progress Notes (Signed)
Day of Surgery Procedure(s) (LRB): HYSTERECTOMY ABDOMINAL, bilateral salpingo-oophorectomy (Bilateral) OMENTECTOMY (N/A)  Subjective: Patient reports tolerating PO.  Pain well controlled  Objective: I have reviewed patient's vital signs, intake and output, medications and surgical results including diagnosis of Left Ovarian Ca.  General: alert, cooperative and no distress  Assessment: s/p Procedure(s): HYSTERECTOMY ABDOMINAL, bilateral salpingo-oophorectomy (Bilateral) OMENTECTOMY (N/A): stable  Plan: Advance diet Encourage ambulation Advance to PO medication Had a lenghty discussion with the patient and her husband regarding the surgical findings and frozen path results.  Discussed the  plan of care including referral to gyn oncology  LOS: 0 days    Joycie Aerts C 02/11/2015, 9:54 PM

## 2015-02-11 NOTE — Anesthesia Procedure Notes (Signed)
Procedure Name: Intubation Date/Time: 02/11/2015 7:38 AM Performed by: Riki Sheer Pre-anesthesia Checklist: Patient identified, Emergency Drugs available, Suction available, Patient being monitored and Timeout performed Patient Re-evaluated:Patient Re-evaluated prior to inductionOxygen Delivery Method: Circle system utilized Preoxygenation: Pre-oxygenation with 100% oxygen Intubation Type: IV induction Ventilation: Mask ventilation without difficulty Laryngoscope Size: Miller and 2 Grade View: Grade I Tube type: Oral Tube size: 7.0 mm Number of attempts: 1 Airway Equipment and Method: Stylet Placement Confirmation: positive ETCO2,  ETT inserted through vocal cords under direct vision,  CO2 detector and breath sounds checked- equal and bilateral Secured at: 21 cm Tube secured with: Tape Dental Injury: Teeth and Oropharynx as per pre-operative assessment

## 2015-02-12 ENCOUNTER — Encounter (HOSPITAL_COMMUNITY): Payer: Self-pay | Admitting: Obstetrics and Gynecology

## 2015-02-12 LAB — CBC
HEMATOCRIT: 32.1 % — AB (ref 36.0–46.0)
HEMOGLOBIN: 10.3 g/dL — AB (ref 12.0–15.0)
MCH: 28.1 pg (ref 26.0–34.0)
MCHC: 32.1 g/dL (ref 30.0–36.0)
MCV: 87.7 fL (ref 78.0–100.0)
Platelets: 265 10*3/uL (ref 150–400)
RBC: 3.66 MIL/uL — AB (ref 3.87–5.11)
RDW: 14.2 % (ref 11.5–15.5)
WBC: 7.9 10*3/uL (ref 4.0–10.5)

## 2015-02-12 MED ORDER — ZOLPIDEM TARTRATE 5 MG PO TABS: 5.0000 mg | ORAL_TABLET | Freq: Every evening | ORAL | Status: DC | PRN

## 2015-02-12 MED ORDER — LACTATED RINGERS IV BOLUS (SEPSIS)
1000.0000 mL | Freq: Once | INTRAVENOUS | Status: AC
Start: 2015-02-12 — End: 2015-02-12
  Administered 2015-02-12: 1000 mL via INTRAVENOUS

## 2015-02-12 MED FILL — Heparin Sodium (Porcine) Inj 5000 Unit/ML: INTRAMUSCULAR | Qty: 1 | Status: AC

## 2015-02-12 NOTE — Progress Notes (Signed)
1 Day Post-Op Procedure(s) (LRB): HYSTERECTOMY ABDOMINAL, bilateral salpingo-oophorectomy (Bilateral) OMENTECTOMY (N/A)  Subjective: Patient reports tolerating PO.  Pain well controlled.  Foley just removed and hasnt voided yet  Objective: I have reviewed patient's vital signs, intake and output, medications and labs.  General: alert, cooperative, appears stated age and no distress GI: soft, non-tender; bowel sounds normal; no masses,  no organomegaly[employer] CD&I  Assessment: s/p Procedure(s): HYSTERECTOMY ABDOMINAL, bilateral salpingo-oophorectomy (Bilateral) OMENTECTOMY (N/A): stable and progressing well  Plan: Advance diet Encourage ambulation Advance to PO medication Discontinue IV fluids  LOS: 1 day    [name] C 02/12/2015, 8:57 AM

## 2015-02-12 NOTE — Care Management Note (Signed)
Case Management Note  Patient Details  Name: [name] MRN: [id number] Date of Birth: 04/07/1961  Subjective/Objective:           Referral from MD and RN to see patient regarding possible home needs for patient. CM met with patient in length in patient's room this afternoon.   Patient expressed need for equipment for home use and [facility] RN when she leaves the hospital.  Offered choice/list to patient and she chose [place].  [name] with [facility] # [phone] talked to patient and CM regarding cost and coverage for patient of equipment for home use.  Patient has already ordered a Drop Arm Commode- DME- from Dr. Gregor Hams office that [facility] has the order for in their main office that is still undelivered at this time but is ready to be picked up by patient.  CM spoke to MD and has received order for rolling walker, tub transfer bench and RN-visits- for wound evaluation/skilled safety evaluation- for the home when patient is discharged. Per [name] with [place] will be covered by patient's insurance for visits at home.   Patient is in agreement with plan and Orders are in EPIC.  Tentative  dc date is either Thursday or Friday of this week  and planned 1st RN visit for patient would be following day if possible. Demographics reviewed with patient and given to [place] via phone # [phone]- to [facility]. [name] will f/u with patient and call her in room tomorrow with final prices and delivery or pick up information regarding DME in the am.  CM talked to Dr. Corinna Capra over phone regarding discharge plans for patient.  CM will continue to follow.       Action/Plan:                    Expected Discharge Plan:  [place]   Discharge planning Services  CM Consult  Post Acute Care Choice:  Home Health, Durable Medical Equipment Choice offered to:  Patient  DME Arranged:  Walker rolling, Tub bench DME Agency:  [place]:  RN [facility] Agency:  [place]  Status of Service:  In process, will continue to follow    Additional Comments:  Yong Channel, RN 02/12/2015, 5:46 PM

## 2015-02-12 NOTE — Anesthesia Postprocedure Evaluation (Signed)
  Anesthesia Post-op Note  Patient: PAMELIA BOTTO  Procedure(s) Performed: Procedure(s): HYSTERECTOMY ABDOMINAL, bilateral salpingo-oophorectomy (Bilateral) OMENTECTOMY (N/A)  Patient Location: [facility]  Anesthesia Type:General  Level of Consciousness: oriented, sedated, patient cooperative and responds to stimulation;spoke to nurse and she stated pt was up most of night.  Reported to her pts sleepy state.  Receiving PCA  Airway and Oxygen Therapy: Patient Spontanous Breathing and Patient connected to nasal cannula oxygen  Post-op Pain: none  Post-op Assessment: Patient's Cardiovascular Status Stable, Respiratory Function Stable, No signs of Nausea or vomiting and Pain level controlled              Post-op Vital Signs: Reviewed and stable  Last Vitals:  Filed Vitals:   02/12/15 0600  BP: 90/50  Pulse: 51  Temp: 36.7 C  Resp: 18    Complications: No apparent anesthesia complications

## 2015-02-12 NOTE — Op Note (Signed)
[name], [name]              ACCOUNT NO.:  [id number]  MEDICAL RECORD NO.:  [id number]  LOCATION:  8850                          FACILITY:  [facility]  PHYSICIAN:  [name], M.D.    DATE OF BIRTH:  May 10, 1960  DATE OF PROCEDURE:  02/11/2015 DATE OF DISCHARGE:                              OPERATIVE REPORT   PREOPERATIVE DIAGNOSES:  Postmenopausal ovarian cyst increased in size, now 9 cm, pelvic pain, discomfort, and uterine fibroids.  POSTOPERATIVE DIAGNOSES:  Postmenopausal ovarian cyst increased in size, now 9 cm, pelvic pain, discomfort, and uterine fibroids plus left ovarian cancer.  Clinically Stage 1 C1 (awaiting surgical staging)  PROCEDURE:  Total abdominal hysterectomy with bilateral salpingectomy, omentectomy, and collection of peritoneal washings.  SURGEON:  [name], M.D.  ASSISTANT:  [name], M.D.  ANESTHESIA:  General endotracheal.  INDICATIONS:  [name] is a 54 year old with a known history of uterine fibroids.  She had D and C last year, which was benign for abnormal bleeding.  Earlier this year, she had a followup ultrasound, which showed uterine fibroids, but normal-appearing ovaries.  In June, she was sent for a CAT scan due to an abdominal umbilical hernia.  At which time, the CAT scan incidentally picked up a 6 cm left adnexal mass.  She has a followup ultrasound in September, which showed that it had increased 6.9 cm, did not have any type of septations at that point. She had a normal CA-125.  We had a followup ultrasound in the middle of October, which showed the cyst had enlarged now with potential of complex cyst and a solid area noted within the mass.  There was no free fluid noted.  The right ovary appeared to be normal.  Still multiple fibroids noted, endometrial thickness was 3.9 mm.  Now, due to increasing in size, question of a solid component and also pelvic pressure and pain, we recommended proceeding with evaluation and  removal of not only left ovarian cyst but with removal of both tubes, ovaries, uterus, and possible necessary procedures.  Risks and benefits were discussed at length.  Informed consent was obtained.  FINDINGS:  At the time of surgery were adhesions left ovarian cyst.  The ovarian cyst was approximately 9 x 5 cm though it had adhesions from the bowel and to the pelvic sidewall.  After clearing the adhesions, there was rupture of the cyst wall, which had a bloody serous fluid and a firm fibrous nodule was noted within the cavity.  This was all sent to pathology.  Normal appearing appendix.  There was no evidence of omental caking.  There was no ascites is noted.  The uterus appeared to have fibroids, multiple small pedunculated and subserosal fibroids noted. Uterus was approximately 8-week size.  The right ovary appeared to be normal.  There were no excrescences or any type of lesions noted along the pelvic sidewalls or abdominal wall.  DESCRIPTION OF PROCEDURE:  After adequate analgesia, the patient placed in the dorsal lithotomy position, was placed in the supine position. She was sterilely prepped and draped.  Foley catheter was sterilely placed.  A Pfannenstiel skin incision was made 2 fingerbreadths above the pubic  symphysis, taken down sharp fashion superiorly and inferiorly off the bellies of the rectus muscle was separated sharply in the midline.  Peritoneum was entered sharply.  Pelvic washings were obtained.  An Alexis retractor was placed as well as O'Connor-O'Sullivan retractor and the bowel was packed cephalad. Gentle sharp dissection was carried out freeing up the left adnexal mass.  After carefully removing the bowel adhesions and adhesions to the pelvic sidewall there was rupture of this cyst area of the ovarian cyst and fibrous tumor noted inside the cyst.  The LigaSure instrument used to ligate across the infundibulopelvic ligament across the utero-ovarian ligament, and  this mass was removed and sent for frozen section to pathologist.  Copious amount of irrigation was then used to irrigate the abdomen and pelvis.  Other adhesions to the left anterior portion of the uterus were taken down sharply.  LigaSure instrument was used to ligate across the remaining portions of the round ligament and the broad ligament.  The bladder was dissected off the anterior surface of the cervix and the uterine vasculature was then ligated with the LigaSure instrument.  A Kelly clamp was placed across the right side of the uterus.  The LigaSure instrument was used to ligate across the infundibulopelvic ligament.  Round ligaments and down to the inferior portion of broad ligament, the bladder was dissected off the anterior surface of the cervix.  The LigaSure instrument was used to ligate across the uterine vasculature and begin at the cardinal ligaments.  After the bladder had been removed from the anterior surface and well away from the bladder dissection, Heaney clamps were used to ligate across the uterosacral ligaments bilaterally and into the vagina. The uterus and cervix were removed.  The cervix appeared to be completely removed with the uterine specimen and sent to pathology.  The vagina was closed with a suture at the uterosacral ligaments and also the angle of the vagina.  Vagina was then closed with figure-of-eight 0 Monocryl sutures with good approximation.  Good hemostasis was noted. Irrigation was applied.  After adequate hemostasis was assured, the uterosacral ligaments were then plicated in the midline.  Due to the dissection around the bladder due to adhesions, I did have the bladder instilled with 300 mL of sterile milk.  There was no evidence of any leaking intra-abdominally.  After evaluation of all the pedicles, good hemostasis appeared to be achieved.  I was able to palpate the ureters bilaterally and good peristalsis was noted and they were away from  the bladder dissection.  We evaluated the appendix, it did appear to be normal in appearance.  At this time, the frozen pathology came back consistent with a serous form of cancer along the left ovary.  Due to this, I was able to pull the omentum as much as I could to the incision and we were able to dissect across this using LigaSure instrument removing the majority of the omental pad.  Hemostasis was achieved. After copious amount of irrigation, adequate hemostasis, the pelvis was noted.  The packing was removed with retractors.  The peritoneum was closed with 0 Monocryl suture.  Rectus muscle plicated in the midline. Fascia was closed with a double loop of 0 PDS with good approximation and good hemostasis noted.  The fascia was then approximated with 0 plain suture and the skin approximated with 3-0 Monocryl in a subcuticular fashion with good approximation and hemostasis noted.  The patient tolerated the procedure well, was stable, transferred to the recovery room  x3. Estimated blood loss was 500 mL.  The patient received 2 g of Ceftin preoperatively.     Kaylee Sabal Corinna [name], M.D.     DCL/MEDQ  D:  02/11/2015  T:  02/12/2015  Job:  [id number]

## 2015-02-12 NOTE — Addendum Note (Signed)
Addendum  created 02/12/15 0948 by Georgeanne Nim, CRNA   Modules edited: Notes Section   Notes Section:  File: [id number]

## 2015-02-13 NOTE — Progress Notes (Signed)
2 Days Post-Op Procedure(s) (LRB): HYSTERECTOMY ABDOMINAL, bilateral salpingo-oophorectomy (Bilateral) OMENTECTOMY (N/A)  Subjective: Patient reports tolerating PO, + flatus and no problems voiding.    Objective: I have reviewed patient's vital signs, intake and output, medications, labs and pathology.  General: alert, cooperative, appears stated age and no distress GI: soft, non-tender; bowel sounds normal; no masses,  no organomegaly and incision: clean, dry and intact  Assessment: s/p Procedure(s): HYSTERECTOMY ABDOMINAL, bilateral salpingo-oophorectomy (Bilateral) OMENTECTOMY (N/A): stable and progressing well  Plan: anticipate dc in am  LOS: 2 days    Hibah Odonnell C 02/13/2015, 6:22 PM

## 2015-02-14 MED ORDER — IBUPROFEN 600 MG PO TABS: 600.0000 mg | ORAL_TABLET | Freq: Four times a day (QID) | ORAL | Status: DC | PRN

## 2015-02-14 MED ORDER — OXYCODONE-ACETAMINOPHEN 5-325 MG PO TABS: 1.0000 | ORAL_TABLET | ORAL | Status: DC | PRN

## 2015-02-14 NOTE — Discharge Summary (Signed)
Physician Discharge Summary  Patient ID: [name] MRN: [id number] DOB/AGE: 54-Sep-1962 54 y.o.  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  Omentectomy performed (neg path) and cytology (neg).  Nodes and diaphragm sampling not perfomed.  Her post up course was unremarkable with good return of ambualation and diet, albeit limited by her obesity.  Discharge home on POD 3 with pain well managed with oral meds and tolerating reg diet.  She wanted [place] to set up home health RN for assistance in the first several days due to her physical limitations and her husband unable to help her with these  Consults: referal to gynh oncology as outpt  Significant Diagnostic Studies: labs: 10.3 post op hgb  Treatments: surgery: TAH/BSO/omentectomy  Discharge Exam: Blood pressure 114/55, pulse 62, temperature 98.2 F (36.8 C), temperature source Oral, resp. rate 18, height 5' 2"  (1.575 m), weight 294 lb (133.358 kg), SpO2 98 %. General appearance: alert, cooperative, appears stated age and no distress GI: soft, non-tender; bowel sounds normal; no masses,  no organomegaly Skin: Skin color, texture, turgor normal. No rashes or lesions Incision/Wound:CD&I  Disposition: 01-Home or Self Care  Discharge Instructions    Call MD for:  difficulty breathing, headache or visual disturbances    Complete by:  As directed      Call MD for:  persistant nausea and vomiting    Complete by:  As directed      Call MD for:  redness, tenderness, or signs of infection (pain, swelling, redness, odor or green/yellow discharge around incision site)    Complete by:  As directed      Call MD for:  severe uncontrolled  pain    Complete by:  As directed      Call MD for:  temperature >100.4    Complete by:  As directed      Diet general    Complete by:  As directed      Driving Restrictions    Complete by:  As directed   No driving for 2 weeks     Increase activity slowly    Complete by:  As directed      Lifting restrictions    Complete by:  As directed   No lifting anything greater than 10 pounds (if you have to ask, don't lift it)     Sexual Activity Restrictions    Complete by:  As directed   Nothing in the vagina for 6 weeks            Medication List    STOP taking these medications        IMODIUM A-D PO      TAKE these medications        ACIDOPHILUS PO  Take 2 capsules by mouth daily.     albuterol 108 (90 BASE) MCG/ACT inhaler  Commonly known as:  PROVENTIL HFA;VENTOLIN HFA  Inhale 2 puffs into the lungs every 4 (four) hours as needed for wheezing or shortness of breath.     alprazolam 2 MG tablet  Commonly known as:  XANAX  Take 2 mg by mouth 2 (two) times daily. 1/2 tab AM and 1 tab PM     amLODipine 2.5 MG tablet  Commonly known  as:  NORVASC  Take 2.5 mg by mouth daily.     budesonide-formoterol 160-4.5 MCG/ACT inhaler  Commonly known as:  SYMBICORT  Inhale 2 puffs into the lungs 2 (two) times daily.     ciprofloxacin 0.3 % ophthalmic solution  Commonly known as:  CILOXAN  Apply 1 drop to eye every 2 (two) hours. Administer 1 drop, every 2 hours, while awake, for 2 days. Then 1 drop, every 4 hours, while awake, for the next 5 days.  For pink eye.     dicyclomine 20 MG tablet  Commonly known as:  BENTYL  Take 1 tablet (20 mg total) by mouth daily as needed.     Fish Oil 1000 MG Caps  Take 2 capsules by mouth daily.     fluconazole 150 MG tablet  Commonly known as:  DIFLUCAN  Take 150 mg by mouth as needed.     glucosamine-chondroitin 500-400 MG tablet  Take 2 tablets by mouth 2 (two) times daily.     hydroxypropyl methylcellulose / hypromellose 2.5 %  ophthalmic solution  Commonly known as:  ISOPTO TEARS / GONIOVISC  Place 2 drops into both eyes as needed for dry eyes.     ibuprofen 600 MG tablet  Commonly known as:  ADVIL,MOTRIN  Take 1 tablet (600 mg total) by mouth every 6 (six) hours as needed (mild pain).     ketoconazole 2 % cream  Commonly known as:  NIZORAL  Apply 1 application topically daily as needed for irritation.     levocetirizine 5 MG tablet  Commonly known as:  XYZAL  Take 5 mg by mouth every morning.     montelukast 10 MG tablet  Commonly known as:  SINGULAIR  Take 10 mg by mouth at bedtime.     orphenadrine 100 MG tablet  Commonly known as:  NORFLEX  Take 100 mg by mouth 2 (two) times daily as needed for mild pain.     oxyCODONE-acetaminophen 5-325 MG tablet  Commonly known as:  PERCOCET/ROXICET  Take 1-2 tablets by mouth every 4 (four) hours as needed for severe pain (moderate to severe pain (when tolerating fluids)).     PAMPRIN MAX PO  Take 2 tablets by mouth daily as needed (pain).     promethazine 25 MG tablet  Commonly known as:  PHENERGAN  Take 25 mg by mouth every 6 (six) hours as needed for nausea.     ranitidine 150 MG tablet  Commonly known as:  ZANTAC  Take 150 mg by mouth 2 (two) times daily.     sertraline 50 MG tablet  Commonly known as:  ZOLOFT  Take 50 mg by mouth daily.     triamcinolone cream 0.1 %  Commonly known as:  KENALOG  Apply 1 application topically 2 (two) times daily.     triamterene-hydrochlorothiazide 75-50 MG tablet  Commonly known as:  MAXZIDE  Take 1 tablet by mouth daily.     Vitamin D3 5000 UNITS Tabs  Take 1 tablet by mouth daily.     vitamin E 400 UNIT capsule  Take 400 Units by mouth daily.         Signed: Paytience Bures C 02/14/2015, 8:41 AM

## 2015-02-24 ENCOUNTER — Ambulatory Visit: Payer: Medicare HMO | Attending: Gynecologic Oncology | Admitting: Gynecologic Oncology

## 2015-02-24 ENCOUNTER — Encounter: Payer: Self-pay | Admitting: Gynecologic Oncology

## 2015-02-24 VITALS — BP 138/70 | HR 73 | Temp 98.2°F | Resp 18 | Ht 62.0 in | Wt 297.3 lb

## 2015-02-24 DIAGNOSIS — C569 Malignant neoplasm of unspecified ovary: Secondary | ICD-10-CM | POA: Insufficient documentation

## 2015-02-24 NOTE — Patient Instructions (Addendum)
We will call you with the results of your CT scan.  NOTHING TO EAT AFTER MIDNIGHT.  YOU MAY HAVE LIQUIDS ONLY 4 HOURS BEFORE.  Plan to meet with Dr. Evlyn Clines to discuss chemotherapy.  We will call you with that appointment date and time.  We will plan for you to receive 6 cycles of carboplatin and taxol.  Please call for any questions or concerns.  Carboplatin injection What is this medicine? CARBOPLATIN (KAR boe pla tin) is a chemotherapy drug. It targets fast dividing cells, like cancer cells, and causes these cells to die. This medicine is used to treat ovarian cancer and many other cancers. This medicine may be used for other purposes; ask your health care provider or pharmacist if you have questions. What should I tell my health care provider before I take this medicine? They need to know if you have any of these conditions: -blood disorders -hearing problems -kidney disease -recent or ongoing radiation therapy -an unusual or allergic reaction to carboplatin, cisplatin, other chemotherapy, other medicines, foods, dyes, or preservatives -pregnant or trying to get pregnant -breast-feeding How should I use this medicine? This drug is usually given as an infusion into a vein. It is administered in a hospital or clinic by a specially trained health care professional. Talk to your pediatrician regarding the use of this medicine in children. Special care may be needed. Overdosage: If you think you have taken too much of this medicine contact a poison control center or emergency room at once. NOTE: This medicine is only for you. Do not share this medicine with others. What if I miss a dose? It is important not to miss a dose. Call your doctor or health care professional if you are unable to keep an appointment. What may interact with this medicine? -medicines for seizures -medicines to increase blood counts like filgrastim, pegfilgrastim, sargramostim -some antibiotics like amikacin,  gentamicin, neomycin, streptomycin, tobramycin -vaccines Talk to your doctor or health care professional before taking any of these medicines: -acetaminophen -aspirin -ibuprofen -ketoprofen -naproxen This list may not describe all possible interactions. Give your health care provider a list of all the medicines, herbs, non-prescription drugs, or dietary supplements you use. Also tell them if you smoke, drink alcohol, or use illegal drugs. Some items may interact with your medicine. What should I watch for while using this medicine? Your condition will be monitored carefully while you are receiving this medicine. You will need important blood work done while you are taking this medicine. This drug may make you feel generally unwell. This is not uncommon, as chemotherapy can affect healthy cells as well as cancer cells. Report any side effects. Continue your course of treatment even though you feel ill unless your doctor tells you to stop. In some cases, you may be given additional medicines to help with side effects. Follow all directions for their use. Call your doctor or health care professional for advice if you get a fever, chills or sore throat, or other symptoms of a cold or flu. Do not treat yourself. This drug decreases your body's ability to fight infections. Try to avoid being around people who are sick. This medicine may increase your risk to bruise or bleed. Call your doctor or health care professional if you notice any unusual bleeding. Be careful brushing and flossing your teeth or using a toothpick because you may get an infection or bleed more easily. If you have any dental work done, tell your dentist you are receiving this  medicine. Avoid taking products that contain aspirin, acetaminophen, ibuprofen, naproxen, or ketoprofen unless instructed by your doctor. These medicines may hide a fever. Do not become pregnant while taking this medicine. Women should inform their doctor if they  wish to become pregnant or think they might be pregnant. There is a potential for serious side effects to an unborn child. Talk to your health care professional or pharmacist for more information. Do not breast-feed an infant while taking this medicine. What side effects may I notice from receiving this medicine? Side effects that you should report to your doctor or health care professional as soon as possible: -allergic reactions like skin rash, itching or hives, swelling of the face, lips, or tongue -signs of infection - fever or chills, cough, sore throat, pain or difficulty passing urine -signs of decreased platelets or bleeding - bruising, pinpoint red spots on the skin, black, tarry stools, nosebleeds -signs of decreased red blood cells - unusually weak or tired, fainting spells, lightheadedness -breathing problems -changes in hearing -changes in vision -chest pain -high blood pressure -low blood counts - This drug may decrease the number of white blood cells, red blood cells and platelets. You may be at increased risk for infections and bleeding. -nausea and vomiting -pain, swelling, redness or irritation at the injection site -pain, tingling, numbness in the hands or feet -problems with balance, talking, walking -trouble passing urine or change in the amount of urine Side effects that usually do not require medical attention (report to your doctor or health care professional if they continue or are bothersome): -hair loss -loss of appetite -metallic taste in the mouth or changes in taste This list may not describe all possible side effects. Call your doctor for medical advice about side effects. You may report side effects to FDA at 1-800-FDA-1088. Where should I keep my medicine? This drug is given in a hospital or clinic and will not be stored at home. NOTE: This sheet is a summary. It may not cover all possible information. If you have questions about this medicine, talk to your  doctor, pharmacist, or health care provider.    2016, Elsevier/Gold Standard. (2007-07-04 14:38:05)  Paclitaxel injection What is this medicine? PACLITAXEL (PAK li TAX el) is a chemotherapy drug. It targets fast dividing cells, like cancer cells, and causes these cells to die. This medicine is used to treat ovarian cancer, breast cancer, and other cancers. This medicine may be used for other purposes; ask your health care provider or pharmacist if you have questions. What should I tell my health care provider before I take this medicine? They need to know if you have any of these conditions: -blood disorders -irregular heartbeat -infection (especially a virus infection such as chickenpox, cold sores, or herpes) -liver disease -previous or ongoing radiation therapy -an unusual or allergic reaction to paclitaxel, alcohol, polyoxyethylated castor oil, other chemotherapy agents, other medicines, foods, dyes, or preservatives -pregnant or trying to get pregnant -breast-feeding How should I use this medicine? This drug is given as an infusion into a vein. It is administered in a hospital or clinic by a specially trained health care professional. Talk to your pediatrician regarding the use of this medicine in children. Special care may be needed. Overdosage: If you think you have taken too much of this medicine contact a poison control center or emergency room at once. NOTE: This medicine is only for you. Do not share this medicine with others. What if I miss a dose? It is important  not to miss your dose. Call your doctor or health care professional if you are unable to keep an appointment. What may interact with this medicine? Do not take this medicine with any of the following medications: -disulfiram -metronidazole This medicine may also interact with the following medications: -cyclosporine -diazepam -ketoconazole -medicines to increase blood counts like filgrastim, pegfilgrastim,  sargramostim -other chemotherapy drugs like cisplatin, doxorubicin, epirubicin, etoposide, teniposide, vincristine -quinidine -testosterone -vaccines -verapamil Talk to your doctor or health care professional before taking any of these medicines: -acetaminophen -aspirin -ibuprofen -ketoprofen -naproxen This list may not describe all possible interactions. Give your health care provider a list of all the medicines, herbs, non-prescription drugs, or dietary supplements you use. Also tell them if you smoke, drink alcohol, or use illegal drugs. Some items may interact with your medicine. What should I watch for while using this medicine? Your condition will be monitored carefully while you are receiving this medicine. You will need important blood work done while you are taking this medicine. This drug may make you feel generally unwell. This is not uncommon, as chemotherapy can affect healthy cells as well as cancer cells. Report any side effects. Continue your course of treatment even though you feel ill unless your doctor tells you to stop. This medicine can cause serious allergic reactions. To reduce your risk you will need to take other medicine(s) before treatment with this medicine. In some cases, you may be given additional medicines to help with side effects. Follow all directions for their use. Call your doctor or health care professional for advice if you get a fever, chills or sore throat, or other symptoms of a cold or flu. Do not treat yourself. This drug decreases your body's ability to fight infections. Try to avoid being around people who are sick. This medicine may increase your risk to bruise or bleed. Call your doctor or health care professional if you notice any unusual bleeding. Be careful brushing and flossing your teeth or using a toothpick because you may get an infection or bleed more easily. If you have any dental work done, tell your dentist you are receiving this  medicine. Avoid taking products that contain aspirin, acetaminophen, ibuprofen, naproxen, or ketoprofen unless instructed by your doctor. These medicines may hide a fever. Do not become pregnant while taking this medicine. Women should inform their doctor if they wish to become pregnant or think they might be pregnant. There is a potential for serious side effects to an unborn child. Talk to your health care professional or pharmacist for more information. Do not breast-feed an infant while taking this medicine. Men are advised not to father a child while receiving this medicine. This product may contain alcohol. Ask your pharmacist or healthcare provider if this medicine contains alcohol. Be sure to tell all healthcare providers you are taking this medicine. Certain medicines, like metronidazole and disulfiram, can cause an unpleasant reaction when taken with alcohol. The reaction includes flushing, headache, nausea, vomiting, sweating, and increased thirst. The reaction can last from 30 minutes to several hours. What side effects may I notice from receiving this medicine? Side effects that you should report to your doctor or health care professional as soon as possible: -allergic reactions like skin rash, itching or hives, swelling of the face, lips, or tongue -low blood counts - This drug may decrease the number of white blood cells, red blood cells and platelets. You may be at increased risk for infections and bleeding. -signs of infection - fever  or chills, cough, sore throat, pain or difficulty passing urine -signs of decreased platelets or bleeding - bruising, pinpoint red spots on the skin, black, tarry stools, nosebleeds -signs of decreased red blood cells - unusually weak or tired, fainting spells, lightheadedness -breathing problems -chest pain -high or low blood pressure -mouth sores -nausea and vomiting -pain, swelling, redness or irritation at the injection site -pain, tingling,  numbness in the hands or feet -slow or irregular heartbeat -swelling of the ankle, feet, hands Side effects that usually do not require medical attention (report to your doctor or health care professional if they continue or are bothersome): -bone pain -complete hair loss including hair on your head, underarms, pubic hair, eyebrows, and eyelashes -changes in the color of fingernails -diarrhea -loosening of the fingernails -loss of appetite -muscle or joint pain -red flush to skin -sweating This list may not describe all possible side effects. Call your doctor for medical advice about side effects. You may report side effects to FDA at 1-800-FDA-1088. Where should I keep my medicine? This drug is given in a hospital or clinic and will not be stored at home. NOTE: This sheet is a summary. It may not cover all possible information. If you have questions about this medicine, talk to your doctor, pharmacist, or health care provider.    2016, Elsevier/Gold Standard. (2014-11-14 13:02:56)

## 2015-02-24 NOTE — Progress Notes (Signed)
Consult Note: Gyn-Onc  Consult was requested by Dr. Corinna Capra for the evaluation of [name] 54 y.o. female with stage IC clear cell ovarian cancer.  CC:  Chief Complaint  Patient presents with  . Ovarian Cancer    New Consult    Assessment/Plan:  [name]  is a 54 y.o.  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  We will order staging CT imaging of the chest, abdomen and pelvis.  I had a lengthy discussion with Yannet and her husband about clear cell ovarian cancers, their overall poor prognosis and and the high probability of recurrence after primary treatment. I discussed that we recommend adjuvant therapy with 6 cycles of carboplatin and paclitaxel after which we will obtain restaging imaging to evaluate for new/residual disease.  CA 125 was not a marker for her (10 preop).  I will have her seen by my colleague from medical oncology, Dr Evlyn Clines who will evaluate her further for commencing chemotherapy.   HPI: [name] is a 54 year old woman who is seen in consultation at the request of Dr Corinna Capra for clear cell ovarian cancer.  The patient has a history of abnormal uterine bleeding/symptomatic fibroids. An US of the pelvis in September 2016 showed a 7cm complex cystic mass seen in the left ovary. A repeat US in October 2016 showed tha the mass had increased to approximately 10cm and was accompanied with normal blood flow and a normal CA 125 (10U/mL on 12/18/14).  She then underwent an ex lap (via Pfannenstiel) TAH, BSO and partial omentectomy on 02/11/15 with Dr Corinna Capra at [facility] in [place]. At the time of surgery, the large cystic mass was appreciated to be densely adherent to the pelvic sidewalls and in the process of removing it, unavoidable rupture occurred. Of note, pelvic washings taken from before the cyst rupture were negative for malignancy. According to the operative note there was complete resection of the cyst  with no residual tumor adhesions or tissues in the pelvis and none identified in the peritoneal cavity. The omentum was grossly normal, and at the time of frozen section revealing malignancy, a sample from the infracolic omentum was taken and was noted to be negative for malignancy.  Final pathology from the surgery revealed clear cell carcinoma of the left ovary. The uterus and contralateral tube and ovary were benign. The omentum was negative for malignancy.  She has done well postoperatively with no major morbidities.  The patient has major medical conditions of morbid obesity (BMI 54 kg/m2), crohn's disease, HTN, osteoporosis.   Current Meds:  Outpatient Encounter Prescriptions as of 02/24/2015  Medication Sig  . albuterol (PROVENTIL HFA;VENTOLIN HFA) 108 (90 BASE) MCG/ACT inhaler Inhale 2 puffs into the lungs every 4 (four) hours as needed for wheezing or shortness of breath.  . alprazolam (XANAX) 2 MG tablet Take 2 mg by mouth 2 (two) times daily. 1/2 tab AM and 1 tab PM  . amLODipine (NORVASC) 2.5 MG tablet Take 2.5 mg by mouth daily.  . Aspirin-Acetaminophen-Caffeine (PAMPRIN MAX PO) Take 2 tablets by mouth daily as needed (pain).   . budesonide-formoterol (SYMBICORT) 160-4.5 MCG/ACT inhaler Inhale 2 puffs into the lungs 2 (two) times daily.  Marland Kitchen ibuprofen (ADVIL,MOTRIN) 600 MG tablet Take 1 tablet (600 mg total) by mouth every 6 (six) hours as needed (mild pain).  Marland Kitchen ketoconazole (NIZORAL) 2 % cream Apply 1 application topically daily as needed for irritation.  . Lactobacillus (ACIDOPHILUS PO)  Take 2 capsules by mouth daily.   Marland Kitchen levocetirizine (XYZAL) 5 MG tablet Take 5 mg by mouth every morning.   . montelukast (SINGULAIR) 10 MG tablet Take 10 mg by mouth at bedtime.  . promethazine (PHENERGAN) 25 MG tablet Take 25 mg by mouth every 6 (six) hours as needed for nausea.   . ranitidine (ZANTAC) 150 MG tablet Take 150 mg by mouth 2 (two) times daily.   . sertraline (ZOLOFT) 50 MG tablet  Take 50 mg by mouth daily.  Marland Kitchen triamcinolone cream (KENALOG) 0.1 % Apply 1 application topically 2 (two) times daily.  Marland Kitchen triamterene-hydrochlorothiazide (MAXZIDE) 75-50 MG per tablet Take 1 tablet by mouth daily.  . Cholecalciferol (VITAMIN D3) 5000 UNITS TABS Take 1 tablet by mouth daily.  Marland Kitchen dicyclomine (BENTYL) 20 MG tablet Take 1 tablet (20 mg total) by mouth daily as needed.  . fluconazole (DIFLUCAN) 150 MG tablet Take 150 mg by mouth as needed.  Marland Kitchen glucosamine-chondroitin 500-400 MG tablet Take 2 tablets by mouth 2 (two) times daily.   . hydroxypropyl methylcellulose / hypromellose (ISOPTO TEARS / GONIOVISC) 2.5 % ophthalmic solution Place 2 drops into both eyes as needed for dry eyes.  . Omega-3 Fatty Acids (FISH OIL) 1000 MG CAPS Take 2 capsules by mouth daily.  . orphenadrine (NORFLEX) 100 MG tablet Take 100 mg by mouth 2 (two) times daily as needed for mild pain.  Marland Kitchen oxyCODONE-acetaminophen (PERCOCET/ROXICET) 5-325 MG tablet Take 1-2 tablets by mouth every 4 (four) hours as needed for severe pain (moderate to severe pain (when tolerating fluids)). (Patient not taking: Reported on 02/24/2015)  . vitamin E 400 UNIT capsule Take 400 Units by mouth daily.  . [DISCONTINUED] ciprofloxacin (CILOXAN) 0.3 % ophthalmic solution Apply 1 drop to eye every 2 (two) hours. Administer 1 drop, every 2 hours, while awake, for 2 days. Then 1 drop, every 4 hours, while awake, for the next 5 days.  For pink eye.   No facility-administered encounter medications on file as of 02/24/2015.    Allergy:  Allergies  Allergen Reactions  . Amoxicillin     REACTION: Rash with high doses. Can take lower doses like 555m  . Butorphanol Tartrate     Gave her the shakes for 6 weeks  . Cephalosporins     REACTION: she has tolerated rocephin and Keflex without difficulty  . Ciprofloxacin Hcl Nausea And Vomiting    Can take lower doses like 5041m  . Clindamycin/Lincomycin Other (See Comments)    Tears stomach up.  .  Codeine     REACTION: nausea  . Effexor [Venlafaxine] Other (See Comments)    Panic attacks  . Levofloxacin     REACTION: tachycardia  . Losartan Other (See Comments)    Headache, nausea, fatigue  . Moxifloxacin     REACTION: tachycardia but tolerated cirpofloxacin just fine  . Paxil [Paroxetine Hcl] Other (See Comments)    fatigue    Social Hx:   Social History   Social History  . Marital Status: Married    Spouse Name: N/A  . Number of Children: 0  . Years of Education: N/A   Occupational History  . disabled    Social History Main Topics  . Smoking status: Never Smoker   . Smokeless tobacco: Never Used  . Alcohol Use: No  . Drug Use: No  . Sexual Activity: Not on file   Other Topics Concern  . Not on file   Social History Narrative    Past Surgical  Hx:  Past Surgical History  Procedure Laterality Date  . Melanoma excision  06/2001    right leg; also removed 2 lymph nodes  . Dilatation & curettage/hysteroscopy with trueclear N/A 08/17/2013    Procedure: DILATATION & CURETTAGE/HYSTEROSCOPY WITH TRUCLEAR;  Surgeon: Luz Lex, MD;  Location: [facility];  Service: Gynecology;  Laterality: N/A;  . Dilation and curettage of uterus    . Abdominal hysterectomy Bilateral 02/11/2015    Procedure: HYSTERECTOMY ABDOMINAL, bilateral salpingo-oophorectomy;  Surgeon: Louretta Shorten, MD;  Location: [facility];  Service: Gynecology;  Laterality: Bilateral;  . Omentectomy N/A 02/11/2015    Procedure: OMENTECTOMY;  Surgeon: Louretta Shorten, MD;  Location: [facility];  Service: Gynecology;  Laterality: N/A;    Past Medical Hx:  Past Medical History  Diagnosis Date  . Pyoderma gangrenosum   . Melanoma ([place])     rt. dorsal leg  . Anxiety   . Arthritis   . Hypertension   . GERD (gastroesophageal reflux disease)   . Crohn disease ([place])   . Insomnia   . Asthma     Past Gynecological History:  See HPI  No LMP recorded (lmp unknown). Patient is not currently having periods (Reason:  Perimenopausal).  Family Hx:  Family History  Problem Relation Age of Onset  . Colon cancer Neg Hx   . Colon polyps Father   . Diabetes Father   . Heart disease Paternal Uncle     Review of Systems:  Constitutional  Feels well,    ENT Normal appearing ears and nares bilaterally Skin/Breast  No rash, sores, jaundice, itching, dryness Cardiovascular  No chest pain, shortness of breath, or edema  Pulmonary  No cough or wheeze.  Gastro Intestinal  No nausea, vomitting, or diarrhoea. No bright red blood per rectum, no abdominal pain, change in bowel movement, or constipation.  Genito Urinary  No frequency, urgency, dysuria, see HPI Musculo Skeletal  No myalgia, arthralgia, joint swelling or pain  Neurologic  No weakness, numbness, change in gait,  Psychology  No depression, anxiety, insomnia.   Vitals:  Blood pressure 138/70, pulse 73, temperature 98.2 F (36.8 C), temperature source Oral, resp. rate 18, height 5' 2"  (1.575 m), weight 297 lb 4.8 oz (134.854 kg), SpO2 100 %.  Physical Exam: WD in NAD Neck  Supple NROM, without any enlargements.  Lymph Node Survey No cervical supraclavicular or inguinal adenopathy Cardiovascular  Pulse normal rate, regularity and rhythm. S1 and S2 normal.  Lungs  Clear to auscultation bilateraly, without wheezes/crackles/rhonchi. Good air movement.  Skin  No rash/lesions/breakdown  Psychiatry  Alert and oriented to person, place, and time  Abdomen  Normoactive bowel sounds, abdomen soft, non-tender and obese. Well healed low transverse incision. + umbilical hernia which is tender to palpate. Back No CVA tenderness Genito Urinary  Vulva/vagina: Normal external female genitalia.  No lesions. No discharge or bleeding.  Bladder/urethra:  No lesions or masses, well supported bladder  Vagina: grossly normal with in tact cuff.  Cervix: surgically absent  Uterus: surgically absent    Adnexa: no palpable residual masses. Rectal  Good  tone, no masses no cul de sac nodularity.  Extremities  No bilateral cyanosis, clubbing or edema.   Donaciano Eva, MD  02/24/2015, 4:10 PM

## 2015-03-03 ENCOUNTER — Ambulatory Visit (HOSPITAL_COMMUNITY): Payer: Medicare HMO

## 2015-03-04 ENCOUNTER — Ambulatory Visit (HOSPITAL_COMMUNITY): Admission: RE | Admit: 2015-03-04 | Payer: Medicare HMO | Source: Ambulatory Visit

## 2015-03-07 ENCOUNTER — Encounter (HOSPITAL_COMMUNITY): Payer: Self-pay

## 2015-03-07 ENCOUNTER — Ambulatory Visit (HOSPITAL_COMMUNITY)
Admission: RE | Admit: 2015-03-07 | Discharge: 2015-03-07 | Disposition: A | Discharge status: Home / Self Care | Payer: Medicare HMO | Source: Ambulatory Visit | Attending: Gynecologic Oncology | Admitting: Gynecologic Oncology

## 2015-03-07 DIAGNOSIS — K429 Umbilical hernia without obstruction or gangrene: Secondary | ICD-10-CM | POA: Insufficient documentation

## 2015-03-07 DIAGNOSIS — Z90710 Acquired absence of both cervix and uterus: Secondary | ICD-10-CM | POA: Insufficient documentation

## 2015-03-07 DIAGNOSIS — K760 Fatty (change of) liver, not elsewhere classified: Secondary | ICD-10-CM | POA: Diagnosis not present

## 2015-03-07 DIAGNOSIS — C569 Malignant neoplasm of unspecified ovary: Secondary | ICD-10-CM | POA: Diagnosis not present

## 2015-03-07 DIAGNOSIS — I700 Atherosclerosis of aorta: Secondary | ICD-10-CM | POA: Diagnosis not present

## 2015-03-07 DIAGNOSIS — K449 Diaphragmatic hernia without obstruction or gangrene: Secondary | ICD-10-CM | POA: Diagnosis not present

## 2015-03-07 MED ORDER — IOHEXOL 300 MG/ML  SOLN
100.0000 mL | Freq: Once | INTRAMUSCULAR | Status: AC | PRN
  Administered 2015-03-07: 100 mL via INTRAVENOUS

## 2015-03-07 MED ORDER — IOHEXOL 300 MG/ML  SOLN
50.0000 mL | Freq: Once | INTRAMUSCULAR | Status: AC | PRN
  Administered 2015-03-07: 50 mL via ORAL

## 2015-03-09 ENCOUNTER — Other Ambulatory Visit: Payer: Self-pay | Admitting: Oncology

## 2015-03-09 DIAGNOSIS — D500 Iron deficiency anemia secondary to blood loss (chronic): Secondary | ICD-10-CM

## 2015-03-09 DIAGNOSIS — C562 Malignant neoplasm of left ovary: Secondary | ICD-10-CM

## 2015-03-10 ENCOUNTER — Telehealth: Payer: Self-pay

## 2015-03-10 ENCOUNTER — Other Ambulatory Visit: Payer: Medicare HMO

## 2015-03-10 NOTE — Telephone Encounter (Signed)
Follow up call placed as requested , patient's chemotherapy class has been changed to 12:00 tomorrow after her scheduled lab draw and MD visit with Dr Marko Plume. No answer , left a detailed message with new date and time of class. Call back information given if patient has additional questions.

## 2015-03-11 ENCOUNTER — Other Ambulatory Visit: Payer: Medicare HMO

## 2015-03-11 ENCOUNTER — Ambulatory Visit: Payer: Medicare HMO | Admitting: Oncology

## 2015-03-11 ENCOUNTER — Telehealth: Payer: Self-pay

## 2015-03-11 ENCOUNTER — Encounter: Payer: Self-pay | Admitting: *Deleted

## 2015-03-11 ENCOUNTER — Telehealth: Payer: Self-pay | Admitting: *Deleted

## 2015-03-11 NOTE — Telephone Encounter (Signed)
Left VM for patient stating that she missed lab and MD appt scheduled this morning at [facility] and that she is scheduled for chemo class this afternoon. Requested return call from patient so appts can be rescheduled.

## 2015-03-11 NOTE — Progress Notes (Signed)
Patient was a no show for chemo class.  Louie Casa and [place] aware.

## 2015-03-11 NOTE — Telephone Encounter (Signed)
Patient called back , no answer , left a detailed message with request to call back to reschedule labs , chemo class and her appointment with Dr Marko Plume . All appointment were schedule for today 03/11/2015 per the patient's request. Patient has not come to lab or MD appointment thus far today , did suggest on the patient's VM to come for her chemo class today and reschedule her lab and appointment with Dr Marko Plume.

## 2015-03-12 ENCOUNTER — Telehealth: Payer: Self-pay | Admitting: *Deleted

## 2015-03-12 NOTE — Telephone Encounter (Signed)
Attempted to reach patient in regards to missed MD visit. Phone goes directly to VM - left VM requesting return call to reschedule.

## 2015-03-13 ENCOUNTER — Telehealth: Payer: Self-pay

## 2015-03-20 ENCOUNTER — Other Ambulatory Visit (HOSPITAL_BASED_OUTPATIENT_CLINIC_OR_DEPARTMENT_OTHER): Payer: Medicare HMO

## 2015-03-20 ENCOUNTER — Encounter: Payer: Self-pay | Admitting: *Deleted

## 2015-03-20 ENCOUNTER — Other Ambulatory Visit: Payer: Medicare HMO

## 2015-03-20 ENCOUNTER — Ambulatory Visit (HOSPITAL_BASED_OUTPATIENT_CLINIC_OR_DEPARTMENT_OTHER): Payer: Medicare HMO | Admitting: Oncology

## 2015-03-20 VITALS — BP 130/65 | HR 67 | Temp 98.0°F | Resp 18 | Ht 62.0 in | Wt 290.8 lb

## 2015-03-20 DIAGNOSIS — Z85820 Personal history of malignant melanoma of skin: Secondary | ICD-10-CM

## 2015-03-20 DIAGNOSIS — C562 Malignant neoplasm of left ovary: Secondary | ICD-10-CM

## 2015-03-20 DIAGNOSIS — K429 Umbilical hernia without obstruction or gangrene: Secondary | ICD-10-CM

## 2015-03-20 DIAGNOSIS — J45909 Unspecified asthma, uncomplicated: Secondary | ICD-10-CM | POA: Diagnosis not present

## 2015-03-20 DIAGNOSIS — J4521 Mild intermittent asthma with (acute) exacerbation: Secondary | ICD-10-CM

## 2015-03-20 DIAGNOSIS — K219 Gastro-esophageal reflux disease without esophagitis: Secondary | ICD-10-CM

## 2015-03-20 DIAGNOSIS — D500 Iron deficiency anemia secondary to blood loss (chronic): Secondary | ICD-10-CM

## 2015-03-20 DIAGNOSIS — K5090 Crohn's disease, unspecified, without complications: Secondary | ICD-10-CM

## 2015-03-20 DIAGNOSIS — Z6841 Body Mass Index (BMI) 40.0 and over, adult: Secondary | ICD-10-CM

## 2015-03-20 DIAGNOSIS — Z9889 Other specified postprocedural states: Secondary | ICD-10-CM

## 2015-03-20 DIAGNOSIS — I10 Essential (primary) hypertension: Secondary | ICD-10-CM

## 2015-03-20 LAB — CBC WITH DIFFERENTIAL/PLATELET
BASO%: 0.6 % (ref 0.0–2.0)
Basophils Absolute: 0 10*3/uL (ref 0.0–0.1)
EOS ABS: 0.1 10*3/uL (ref 0.0–0.5)
EOS%: 1.7 % (ref 0.0–7.0)
HCT: 33.5 % — ABNORMAL LOW (ref 34.8–46.6)
HGB: 10.9 g/dL — ABNORMAL LOW (ref 11.6–15.9)
LYMPH%: 16.7 % (ref 14.0–49.7)
MCH: 27.3 pg (ref 25.1–34.0)
MCHC: 32.6 g/dL (ref 31.5–36.0)
MCV: 83.6 fL (ref 79.5–101.0)
MONO#: 0.6 10*3/uL (ref 0.1–0.9)
MONO%: 9.6 % (ref 0.0–14.0)
NEUT#: 4.3 10*3/uL (ref 1.5–6.5)
NEUT%: 71.4 % (ref 38.4–76.8)
PLATELETS: 287 10*3/uL (ref 145–400)
RBC: 4.01 10*6/uL (ref 3.70–5.45)
RDW: 13.9 % (ref 11.2–14.5)
WBC: 6 10*3/uL (ref 3.9–10.3)
lymph#: 1 10*3/uL (ref 0.9–3.3)

## 2015-03-20 LAB — COMPREHENSIVE METABOLIC PANEL
ALT: 13 U/L (ref 0–55)
ANION GAP: 12 meq/L — AB (ref 3–11)
AST: 19 U/L (ref 5–34)
Albumin: 3.4 g/dL — ABNORMAL LOW (ref 3.5–5.0)
Alkaline Phosphatase: 80 U/L (ref 40–150)
BUN: 27.3 mg/dL — AB (ref 7.0–26.0)
CALCIUM: 9.2 mg/dL (ref 8.4–10.4)
CHLORIDE: 101 meq/L (ref 98–109)
CO2: 25 meq/L (ref 22–29)
CREATININE: 1.1 mg/dL (ref 0.6–1.1)
EGFR: 60 mL/min/{1.73_m2} — ABNORMAL LOW (ref >90)
Glucose: 93 mg/dl (ref 70–140)
POTASSIUM: 3.4 meq/L — AB (ref 3.5–5.1)
Sodium: 138 mEq/L (ref 136–145)
Total Bilirubin: 0.42 mg/dL (ref 0.20–1.20)
Total Protein: 8.2 g/dL (ref 6.4–8.3)

## 2015-03-20 LAB — IRON AND TIBC
%SAT: 14 % — AB (ref 21–57)
IRON: 41 ug/dL (ref 41–142)
TIBC: 285 ug/dL (ref 236–444)
UIBC: 244 ug/dL (ref 120–384)
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
## 2015-03-20 NOTE — Progress Notes (Signed)
Spent time in chemo class with patient reviewing side effects of Taxol and Carboplatin.  See education flowsheet

## 2015-03-21 ENCOUNTER — Telehealth: Payer: Self-pay | Admitting: Oncology

## 2015-03-21 NOTE — Telephone Encounter (Signed)
Called and left a message with appointments,12/15-16 have been capped for chemo

## 2015-03-23 ENCOUNTER — Encounter: Payer: Self-pay | Admitting: Oncology

## 2015-03-23 DIAGNOSIS — Z9889 Other specified postprocedural states: Secondary | ICD-10-CM | POA: Insufficient documentation

## 2015-03-23 DIAGNOSIS — Z6841 Body Mass Index (BMI) 40.0 and over, adult: Secondary | ICD-10-CM

## 2015-03-23 DIAGNOSIS — Z85820 Personal history of malignant melanoma of skin: Secondary | ICD-10-CM

## 2015-03-23 DIAGNOSIS — K5090 Crohn's disease, unspecified, without complications: Secondary | ICD-10-CM | POA: Insufficient documentation

## 2015-03-23 DIAGNOSIS — K429 Umbilical hernia without obstruction or gangrene: Secondary | ICD-10-CM

## 2015-03-23 DIAGNOSIS — I10 Essential (primary) hypertension: Secondary | ICD-10-CM | POA: Insufficient documentation

## 2015-03-23 DIAGNOSIS — J45901 Unspecified asthma with (acute) exacerbation: Secondary | ICD-10-CM | POA: Insufficient documentation

## 2015-03-23 HISTORY — DX: Umbilical hernia without obstruction or gangrene: K42.9

## 2015-03-23 HISTORY — DX: Morbid (severe) obesity due to excess calories: E66.01

## 2015-03-23 HISTORY — DX: Personal history of malignant melanoma of skin: Z85.820

## 2015-03-23 HISTORY — DX: Other specified postprocedural states: Z98.890

## 2015-03-23 HISTORY — DX: Crohn's disease, unspecified, without complications: K50.90

## 2015-03-24 ENCOUNTER — Telehealth: Payer: Self-pay

## 2015-03-24 DIAGNOSIS — C562 Malignant neoplasm of left ovary: Secondary | ICD-10-CM

## 2015-03-24 MED ORDER — LORAZEPAM 0.5 MG PO TABS: ORAL_TABLET | ORAL | Status: DC

## 2015-03-24 MED ORDER — ONDANSETRON HCL 8 MG PO TABS: 8.0000 mg | ORAL_TABLET | Freq: Three times a day (TID) | ORAL | Status: DC | PRN

## 2015-03-24 MED ORDER — DEXAMETHASONE 4 MG PO TABS: ORAL_TABLET | ORAL | Status: DC

## 2015-03-24 NOTE — Telephone Encounter (Signed)
Pt had called earlier that she cannot start her treatment on the 14th. lvm asking for the reason and how we can help. (the 15th and 16th are capped in infusion).

## 2015-03-24 NOTE — Telephone Encounter (Signed)
-----   Message from Gordy Levan, MD sent at 03/23/2015 12:54 PM EST ----- Please send scripts - generics always fine  Lorazepam 0.5 mg   One SL or po every 6 hrs as needed for nausea. WIll make drowsy. Do not use xanax within 4 hrs of lorazepam. #20 no RF  Ondansetron 8 mg   One every 8 hrs as needed for nausea, will not make drowsy  #30 2 RF  Dexamethasone  4 mg   Five tabs = 20 mg with food 12 hrs and again 6 hrs before taxol, or as directed.  # 20    RN please speak with her by phone on 12-12 or 12-13 to review antiemetics and time for decadron prior to first chemo on 12-14  thanks

## 2015-03-24 NOTE — Telephone Encounter (Signed)
Phoned in lorazepam, escribed ondansetron and dexamethasone. Was unable to contact pt for verbal instructions.

## 2015-03-25 NOTE — Telephone Encounter (Signed)
Spoke with [name] at length about treatment tomorrow.  Answered questions about appointment..She will keep treatment on 03-25-13 as her husband canceled his doctor's appointment for tomorrow. Reviewed the decadron  premedication times for treatment at 1330 tomorrow 03-26-15.  She will take 1/2 to 1 tablet of Xanax 2 mg tab with steroid s as she is very anxious and steroids make it worse for her.   Reviewed taking ativan tomorrow night whether or not nauseous and zofran 8 mg in the am on 03-27-15 whether or not nauseous then use as needed.  [name] wrote instructions down and repeated the directions correctly to this nurse. Told [name] to not use Xanax for 4 hours  after taking ativan as noted below.

## 2015-03-26 ENCOUNTER — Other Ambulatory Visit (HOSPITAL_BASED_OUTPATIENT_CLINIC_OR_DEPARTMENT_OTHER): Payer: Medicare HMO

## 2015-03-26 ENCOUNTER — Other Ambulatory Visit: Payer: Self-pay | Admitting: *Deleted

## 2015-03-26 ENCOUNTER — Ambulatory Visit (HOSPITAL_BASED_OUTPATIENT_CLINIC_OR_DEPARTMENT_OTHER): Payer: Medicare HMO

## 2015-03-26 VITALS — BP 136/61 | HR 62 | Temp 97.0°F | Resp 18

## 2015-03-26 DIAGNOSIS — C562 Malignant neoplasm of left ovary: Secondary | ICD-10-CM

## 2015-03-26 DIAGNOSIS — Z5111 Encounter for antineoplastic chemotherapy: Secondary | ICD-10-CM

## 2015-03-26 LAB — CBC WITH DIFFERENTIAL/PLATELET
BASO%: 0.2 % (ref 0.0–2.0)
Basophils Absolute: 0 10*3/uL (ref 0.0–0.1)
EOS%: 0 % (ref 0.0–7.0)
Eosinophils Absolute: 0 10*3/uL (ref 0.0–0.5)
HEMATOCRIT: 36.9 % (ref 34.8–46.6)
HEMOGLOBIN: 11.8 g/dL (ref 11.6–15.9)
LYMPH#: 0.6 10*3/uL — AB (ref 0.9–3.3)
LYMPH%: 11.7 % — ABNORMAL LOW (ref 14.0–49.7)
MCH: 26.9 pg (ref 25.1–34.0)
MCHC: 31.8 g/dL (ref 31.5–36.0)
MCV: 84.4 fL (ref 79.5–101.0)
MONO#: 0 10*3/uL — ABNORMAL LOW (ref 0.1–0.9)
MONO%: 0.5 % (ref 0.0–14.0)
NEUT%: 87.6 % — ABNORMAL HIGH (ref 38.4–76.8)
NEUTROS ABS: 4.1 10*3/uL (ref 1.5–6.5)
Platelets: 320 10*3/uL (ref 145–400)
RBC: 4.38 10*6/uL (ref 3.70–5.45)
RDW: 14.5 % (ref 11.2–14.5)
WBC: 4.7 10*3/uL (ref 3.9–10.3)

## 2015-03-26 LAB — COMPREHENSIVE METABOLIC PANEL
ALBUMIN: 3.4 g/dL — AB (ref 3.5–5.0)
ALK PHOS: 92 U/L (ref 40–150)
ALT: 18 U/L (ref 0–55)
AST: 18 U/L (ref 5–34)
Anion Gap: 11 mEq/L (ref 3–11)
BUN: 19.9 mg/dL (ref 7.0–26.0)
CALCIUM: 9.7 mg/dL (ref 8.4–10.4)
CO2: 22 mEq/L (ref 22–29)
CREATININE: 1.1 mg/dL (ref 0.6–1.1)
Chloride: 103 mEq/L (ref 98–109)
EGFR: 59 mL/min/{1.73_m2} — ABNORMAL LOW (ref >90)
Glucose: 239 mg/dl — ABNORMAL HIGH (ref 70–140)
Potassium: 3.6 mEq/L (ref 3.5–5.1)
SODIUM: 136 meq/L (ref 136–145)
Total Bilirubin: 0.33 mg/dL (ref 0.20–1.20)
Total Protein: 9 g/dL — ABNORMAL HIGH (ref 6.4–8.3)

## 2015-03-26 MED ORDER — DIPHENHYDRAMINE HCL 50 MG/ML IJ SOLN
50.0000 mg | Freq: Once | INTRAMUSCULAR | Status: AC
  Administered 2015-03-26: 50 mg via INTRAVENOUS

## 2015-03-26 MED ORDER — FAMOTIDINE IN NACL 20-0.9 MG/50ML-% IV SOLN
INTRAVENOUS | Status: AC
  Filled 2015-03-26: qty 50

## 2015-03-26 MED ORDER — LORAZEPAM 1 MG PO TABS
1.0000 mg | ORAL_TABLET | Freq: Once | ORAL | Status: AC | PRN
  Administered 2015-03-26: 1 mg via ORAL

## 2015-03-26 MED ORDER — LORAZEPAM 1 MG PO TABS
ORAL_TABLET | ORAL | Status: AC
  Filled 2015-03-26: qty 1

## 2015-03-26 MED ORDER — DIPHENHYDRAMINE HCL 50 MG/ML IJ SOLN
INTRAMUSCULAR | Status: AC
  Filled 2015-03-26: qty 1

## 2015-03-26 MED ORDER — DEXAMETHASONE SODIUM PHOSPHATE 100 MG/10ML IJ SOLN
Freq: Once | INTRAMUSCULAR | Status: AC
  Administered 2015-03-26: 14:00:00 via INTRAVENOUS
  Filled 2015-03-26: qty 8

## 2015-03-26 MED ORDER — SODIUM CHLORIDE 0.9 % IV SOLN
Freq: Once | INTRAVENOUS | Status: AC
  Administered 2015-03-26: 14:00:00 via INTRAVENOUS

## 2015-03-26 MED ORDER — SODIUM CHLORIDE 0.9 % IV SOLN
733.5000 mg | Freq: Once | INTRAVENOUS | Status: AC
  Administered 2015-03-26: 730 mg via INTRAVENOUS
  Filled 2015-03-26: qty 73

## 2015-03-26 MED ORDER — FAMOTIDINE IN NACL 20-0.9 MG/50ML-% IV SOLN
20.0000 mg | Freq: Once | INTRAVENOUS | Status: AC
  Administered 2015-03-26: 20 mg via INTRAVENOUS

## 2015-03-26 MED ORDER — PACLITAXEL CHEMO INJECTION 300 MG/50ML
80.0000 mg/m2 | Freq: Once | INTRAVENOUS | Status: AC
  Administered 2015-03-26: 192 mg via INTRAVENOUS
  Filled 2015-03-26: qty 32

## 2015-03-26 NOTE — Patient Instructions (Addendum)
[facility] Discharge Instructions for Patients Receiving Chemotherapy  Today you received the following chemotherapy agents Taxol/Carboplatin.  To help prevent nausea and vomiting after your treatment, we encourage you to take your nausea medication as prescribed.   If you develop nausea and vomiting that is not controlled by your nausea medication, call the clinic.   BELOW ARE SYMPTOMS THAT SHOULD BE REPORTED IMMEDIATELY:  *FEVER GREATER THAN 100.5 F  *CHILLS WITH OR WITHOUT FEVER  NAUSEA AND VOMITING THAT IS NOT CONTROLLED WITH YOUR NAUSEA MEDICATION  *UNUSUAL SHORTNESS OF BREATH  *UNUSUAL BRUISING OR BLEEDING  TENDERNESS IN MOUTH AND THROAT WITH OR WITHOUT PRESENCE OF ULCERS  *URINARY PROBLEMS  *BOWEL PROBLEMS  UNUSUAL RASH Items with * indicate a potential emergency and should be followed up as soon as possible.  Feel free to call the clinic you have any questions or concerns. The clinic phone number is [phone].  Please show the [place] at check-in to the Emergency Department and triage nurse.  Take Ativan and not Xanax. If you need to take the Xanax make sure it is at least 4 hours apart from taking the Ativan. Be sure to take Zofran in the morning.

## 2015-03-26 NOTE — Telephone Encounter (Signed)
Letter  

## 2015-03-27 ENCOUNTER — Telehealth: Payer: Self-pay

## 2015-03-27 ENCOUNTER — Encounter: Payer: Self-pay | Admitting: Oncology

## 2015-03-27 ENCOUNTER — Other Ambulatory Visit: Payer: Self-pay | Admitting: Oncology

## 2015-03-27 NOTE — Telephone Encounter (Signed)
-----   Message from Renford Dills, RN sent at 03/26/2015  3:57 PM EST ----- Regarding: chemo f/u [place] [facility] Taxol/Carbo

## 2015-03-27 NOTE — Telephone Encounter (Signed)
Faxed prior authorization form dated 03-27-15 to [facility].  Sent a copy to HIM to be scanned into patient's EMR.

## 2015-03-27 NOTE — Telephone Encounter (Signed)
LM requesting that [name] call back to [phone] to let Dr. Marko Plume know how she is doing after her first treatment yesterday.

## 2015-03-27 NOTE — Telephone Encounter (Signed)
[name] returned call for chemotherapy F/U.  Patient is doing well.  Denies n/v.  Reports new sensation that she "noticed last night at 6:00 pm that continues.  My head feels funny.  I'm not dizzy but have a heavy head that could lead to wobbly.  My stomach has gassy feeling.  What can I take for gas?"  The head and stomach occur together"  No other side effects or symptoms reportsed.  Bowels moved late last night and bladder is functioning well.  Eating well but not drinking enough.  I instructed to drink 64 oz minimum daily or at least the day before, of and after treatment.  Also advised to avoid spicy, fried, greasy, acidic foods.  Takes norvasc as ordered daily.  Planning to go out later to check B/P in drugstore.  Suggested she call our outpatient pharmacy, [place] or [place] for well fitted cuff for home use.  Denies taking any Chrohns medicines ordered by Dr. Olevia Perches in the past also denies taking any pain meds (Percocet) and would like this removed from medication list.  Denies further questions at this time and encouraged to call if needed.  Reviewed how to call after hours in the case of an emergency. Return number for gas and head heaviness [phone] (H).

## 2015-03-27 NOTE — Progress Notes (Signed)
Called patient and left message to introduce myself as Estate manager/land agent. Will ask patient if she has any financial questions or concerns or possibly need grant assistance. Left my name and number on voicemail.

## 2015-03-28 ENCOUNTER — Other Ambulatory Visit: Payer: Self-pay

## 2015-03-28 NOTE — Telephone Encounter (Signed)
[employer] received prior authorization form and will respond in 24-72 hours.  Form is in review.  Response will be faxed to [phone] which is the main fax number in HIM.

## 2015-03-28 NOTE — Telephone Encounter (Signed)
LM for [name] stating that her Ativan prescription needs prior authorization.  The prior authorization form was faxed  to [facility] 03-27-15.  Use the Ondansetron  As needed for nausea.  The Ativan may not be authorized before the weekend. [name] is is doing well.   She can call the cancer center at [phone] if she has any issues or concerns.

## 2015-03-30 ENCOUNTER — Other Ambulatory Visit: Payer: Self-pay | Admitting: Oncology

## 2015-03-31 ENCOUNTER — Other Ambulatory Visit (HOSPITAL_BASED_OUTPATIENT_CLINIC_OR_DEPARTMENT_OTHER): Payer: Medicare HMO

## 2015-03-31 ENCOUNTER — Encounter: Payer: Self-pay | Admitting: Oncology

## 2015-03-31 ENCOUNTER — Telehealth: Payer: Self-pay | Admitting: Oncology

## 2015-03-31 ENCOUNTER — Other Ambulatory Visit: Payer: Self-pay | Admitting: Oncology

## 2015-03-31 ENCOUNTER — Ambulatory Visit (HOSPITAL_BASED_OUTPATIENT_CLINIC_OR_DEPARTMENT_OTHER): Payer: Medicare HMO | Admitting: Oncology

## 2015-03-31 VITALS — BP 113/68 | HR 75 | Temp 98.2°F | Resp 18 | Ht 62.0 in | Wt 289.5 lb

## 2015-03-31 DIAGNOSIS — C569 Malignant neoplasm of unspecified ovary: Secondary | ICD-10-CM

## 2015-03-31 DIAGNOSIS — C562 Malignant neoplasm of left ovary: Secondary | ICD-10-CM

## 2015-03-31 DIAGNOSIS — F419 Anxiety disorder, unspecified: Secondary | ICD-10-CM

## 2015-03-31 DIAGNOSIS — F411 Generalized anxiety disorder: Secondary | ICD-10-CM

## 2015-03-31 LAB — CBC WITH DIFFERENTIAL/PLATELET
BASO%: 0.5 % (ref 0.0–2.0)
Basophils Absolute: 0 10*3/uL (ref 0.0–0.1)
EOS ABS: 0.2 10*3/uL (ref 0.0–0.5)
EOS%: 5.5 % (ref 0.0–7.0)
HCT: 33 % — ABNORMAL LOW (ref 34.8–46.6)
HGB: 10.6 g/dL — ABNORMAL LOW (ref 11.6–15.9)
LYMPH%: 29.7 % (ref 14.0–49.7)
MCH: 26.9 pg (ref 25.1–34.0)
MCHC: 32.3 g/dL (ref 31.5–36.0)
MCV: 83.4 fL (ref 79.5–101.0)
MONO#: 0 10*3/uL — ABNORMAL LOW (ref 0.1–0.9)
MONO%: 1.2 % (ref 0.0–14.0)
NEUT%: 63.1 % (ref 38.4–76.8)
NEUTROS ABS: 2.1 10*3/uL (ref 1.5–6.5)
Platelets: 224 10*3/uL (ref 145–400)
RBC: 3.96 10*6/uL (ref 3.70–5.45)
RDW: 13.7 % (ref 11.2–14.5)
WBC: 3.4 10*3/uL — AB (ref 3.9–10.3)
lymph#: 1 10*3/uL (ref 0.9–3.3)

## 2015-03-31 LAB — COMPREHENSIVE METABOLIC PANEL
ALT: 29 U/L (ref 0–55)
AST: 30 U/L (ref 5–34)
Albumin: 3.1 g/dL — ABNORMAL LOW (ref 3.5–5.0)
Alkaline Phosphatase: 64 U/L (ref 40–150)
Anion Gap: 7 mEq/L (ref 3–11)
BUN: 22.6 mg/dL (ref 7.0–26.0)
CHLORIDE: 103 meq/L (ref 98–109)
CO2: 28 meq/L (ref 22–29)
Calcium: 8.6 mg/dL (ref 8.4–10.4)
Creatinine: 0.9 mg/dL (ref 0.6–1.1)
EGFR: 73 mL/min/{1.73_m2} — AB (ref >90)
GLUCOSE: 95 mg/dL (ref 70–140)
POTASSIUM: 3.6 meq/L (ref 3.5–5.1)
SODIUM: 138 meq/L (ref 136–145)
TOTAL PROTEIN: 7.4 g/dL (ref 6.4–8.3)
Total Bilirubin: 0.67 mg/dL (ref 0.20–1.20)

## 2015-03-31 NOTE — Progress Notes (Signed)
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 She has done generally well with initiation of chemotherapy.   Patient tolerated premedication steroids for the taxol and peripheral IV access was obtained on first attempt. She did not have significant taxol aches and has no peripheral neuropathy symptoms. She has had some occasional nausea, zofran helpful, ativan not yet obtained due to need for preauthorization. She has not been excessively fatigued, tho she has been minimally active. Surgical wound is no longer open or bleeding. No BM x 3 days after chemo until small bowel movement last pm, only med presently is colace bid which she began a few days after chemo. She denies fever with Tmax 99.4 but had "chills" for 2 days after chemo, no localizing symptoms of infection. No increased SOB or other respiratory symptoms, abdominal or pelvic pain, other bleeding, increased swelling LE. She has been eating and drinking liquids. Remainder or 10 point Review of Systems negative.    No central line Refused flu vaccine again today No genetics testing CA 125 not marker (10 preop)  ONCOLOGIC HISTORY Patient has been followed closely by Dr Corinna Capra of gyn, including hysteroscopy D & C 08-2013 for bleeding, with benign endometrial polyp then ([id number]). She had CT at outside facility [phone] because of umbilical hernia, which showed 6 cm left adnexal mass with imaging consistent with ovarian cyst. She had follow up imaging in 12-2014 with cyst 6.9 cm, not complex, and CA 125 normal at 10 on 12-18-14. By repeat US 01-23-15 the area measured 9.7 x 7.0 x 6.4 cm with normal flow to ovary, no flow to cyst and no  free fluid, endometrial stripe 3.9 mm; patient was then having some pelvic pressure. She had abdominal hysterectomy BSO by Dr Corinna Capra on 02-11-15, with operative findings of adhesions from left adnexal mass to bowel and pelvic sidewall, no ascites, normal appearing omentum, normal appendix; there was unavoidable intraoperative rupture during surgery. Pathology [phone]) left clear cell ovarian carcinoma at least 7 cy with capsule rupture, benign left tube, right ovary and tube and uterus. Cytology of washings negative for malignancy ([id number]). Post operative course has been uncomplicated. CT CAP 03-07-15 showed no apparent metastatic disease, with hepatic steatosis, abdominal aortic atherosclerosis and some fluid at paraumbilical hernia. Patient saw Dr Denman George in consultation on 02-24-15, with recommendation for 6 cycles carboplatin taxol adjuvantly. She had day 1 cycle 1 dose dense carbo taxol on12-14-16.   Objective:  Vital signs in last 24 hours:  BP 113/68 mmHg  Pulse 75  Temp(Src) 98.2 F (36.8 C) (Oral)  Resp 18  Ht 5' 2"  (1.575 m)  Wt 289 lb 8 oz (131.316 kg)  BMI 52.94 kg/m2  SpO2 100% Weight down 1 lb Alert, oriented and appropriate. Ambulatory without difficulty.  No alopecia  HEENT:PERRL, sclerae not icteric. Oral mucosa moist without lesions, posterior pharynx clear.  Neck supple. No JVD.  Lymphatics:no cervical,supraclavicular or inguinal adenopathy Resp: clear to auscultation bilaterally and normal percussion bilaterally, diminished BS bases consistent with habitus Cardio: regular rate and rhythm. No gallop. GI: abdomen obese, soft, nontender, not distended, umbilical hernia stable and not tender, otherwise no appreciable mass or organomegaly. Some bowel sounds. Surgical incision now entirely closed, some  superficial yeast but no drainage or signs of cellulitis Musculoskeletal/ Extremities: without pitting edema, cords, tenderness Neuro: no peripheral neuropathy. Otherwise  nonfocal. PSYCH  Appropriate mood and affect Skin cutaneus yeast at left side of surgical incision in skin fold, scattered slight folliculitis upper back, otherwise without rash, ecchymosis, petechiae   Lab Results:  Results for orders placed or performed in visit on 03/31/15  CBC with Differential  Result Value Ref Range   WBC 3.4 (L) 3.9 - 10.3 10e3/uL   NEUT# 2.1 1.5 - 6.5 10e3/uL   HGB 10.6 (L) 11.6 - 15.9 g/dL   HCT 33.0 (L) 34.8 - 46.6 %   Platelets 224 145 - 400 10e3/uL   MCV 83.4 79.5 - 101.0 fL   MCH 26.9 25.1 - 34.0 pg   MCHC 32.3 31.5 - 36.0 g/dL   RBC 3.96 3.70 - 5.45 10e6/uL   RDW 13.7 11.2 - 14.5 %   lymph# 1.0 0.9 - 3.3 10e3/uL   MONO# 0.0 (L) 0.1 - 0.9 10e3/uL   Eosinophils Absolute 0.2 0.0 - 0.5 10e3/uL   Basophils Absolute 0.0 0.0 - 0.1 10e3/uL   NEUT% 63.1 38.4 - 76.8 %   LYMPH% 29.7 14.0 - 49.7 %   MONO% 1.2 0.0 - 14.0 %   EOS% 5.5 0.0 - 7.0 %   BASO% 0.5 0.0 - 2.0 %  Comprehensive metabolic panel  Result Value Ref Range   Sodium 138 136 - 145 mEq/L   Potassium 3.6 3.5 - 5.1 mEq/L   Chloride 103 98 - 109 mEq/L   CO2 28 22 - 29 mEq/L   Glucose 95 70 - 140 mg/dl   BUN 22.6 7.0 - 26.0 mg/dL   Creatinine 0.9 0.6 - 1.1 mg/dL   Total Bilirubin 0.67 0.20 - 1.20 mg/dL   Alkaline Phosphatase 64 40 - 150 U/L   AST 30 5 - 34 U/L   ALT 29 0 - 55 U/L   Total Protein 7.4 6.4 - 8.3 g/dL   Albumin 3.1 (L) 3.5 - 5.0 g/dL   Calcium 8.6 8.4 - 10.4 mg/dL   Anion Gap 7 3 - 11 mEq/L   EGFR 73 (L) >90 ml/min/1.73 m2    CBC discussed as above  Studies/Results:  No results found.  Medications: I have reviewed the patient's current medications. Continue colace bid, add miralax 1-2x daily to keep bowels moving daily. Stop cream to surgical incision, use nystatin powder bid. Decadron 20 mg with food 12 hrs prior to each chemo.   DISCUSSION Patient and husband are pleased that she did well with first treatment and are in agreement with continuing chemotherapy. Counts  may drop further in next week, granix approved at 480 mcg if need to add this, but for now will follow up CBC on 12-22 and 12-29 days of treatment. Continue peripheral IV access as this is adequate for now Clarified treatment schedule. They understand that day 1 with [place] + taxol can have most associated symptoms Premedication decadron for taxol now will be 20 mg 12 hrs prior. Discussed good nutrition. Will ask [facility] dietician to follow up Discussed activity  Assessment/Plan:  1. Clear cell carcinoma of ovary clinically IC: found incidentally on CT done for unrelated umbilical hernia, normal CA 125, post abdominal hysterectomy BSO by Dr Corinna Capra 02-11-15 and for 6 cycles carboplatin taxol begun 03-26-15 using dose dense regimen. She will have day 8 cycle 1 on 04-03-15 using CMET from 12-19 and as long as [place] >=1.5 and plt >=100k that  day, then day 15 cycle 1 with same parameters on 04-10-15. I will see her in treatment area on 04-10-15. May need to add granix after day 8 depending on counts. Decrease premed decadron to one dose 12 hrs prior to each taxol. 2.morbid obesity, BMI 53. Will ask [place] to follow. 3.asthma: on regular treatment, exacerbations generally with respiratory illnesses 4.refuses flu vaccine 5.peripheral IV access appears adequate now, however may not be sufficient for all of chemotherapy 6.history of crohn's disease/ ileitis, which has not required intervention in past 10 years. Known to [facility] 7.history of melanoma right posterior thigh 2003 with 2 sentinel nodes negative. Followed by Dr Wilhemina Bonito 8.umbilical hernia since 1610, stable 9.multiple drug intolerances. EMR notes is able to take Rocephin and Keflex 10.HTN x ~ 10 years followed now by PCP 11.GERD longstanding and unchanged 12.hepatic steatosis by CT 13. Thoracic and abdominal aortic atherosclerosis by CT 14. History of chronic anxiety and panic attacks   All questions answered and patient/ husband are in  agreement with recommendations and plans, know to call if concerns between appointments. Chemo orders confirmed. Time spent 35 min including >50% counseling and coordination of care. Cc Dr Ilene Qua and Dr Dorie Rank, MD   03/31/2015, 12:51 PM

## 2015-03-31 NOTE — Telephone Encounter (Signed)
Appointments made and avs printed for patient °

## 2015-04-01 ENCOUNTER — Telehealth: Payer: Self-pay

## 2015-04-01 NOTE — Telephone Encounter (Signed)
Received approval letter from [facility] a copy to HIM to be scanned into patient's EMR.  Stating that the lorazepam was approved until 03-23-16. Called [employer] and notified them of approval.  The medication is ready for her pick up and prescription went through last night.

## 2015-04-01 NOTE — Telephone Encounter (Addendum)
LM for [name] to call back to review the change in decadron pre medication for the next treatment. Stated that the prior authorization for the ativan went through.  The pharmacy has filled the prescription and is ready for pick up. She also has 2 refills on her current zofran prescription if needed.

## 2015-04-01 NOTE — Telephone Encounter (Signed)
-----   Message from Gordy Levan, MD sent at 04/01/2015 11:42 AM EST ----- Next treatment 12-22  Please speak with her prior to confirm decadron five tabs = 20 mg one dose 12 hrs prior, with food.  She should be using miralax 1-2x daily now  We went over a lot of information at visit and I did not write this in patient instructions.   I need to be sure to see CBC on 12-22 as may need to add granix  Thank you

## 2015-04-02 NOTE — Telephone Encounter (Signed)
Ms. Hinkley called back and left message stating that she received this nurse's vm.  She understands to take decadron 5 tabs 12 hours prior to treatment tomorrow. She took the Miralax yesterday and it was faster more efficient way to move her bowels.  She had three BM, all soft.

## 2015-04-03 ENCOUNTER — Other Ambulatory Visit: Payer: Self-pay | Admitting: Oncology

## 2015-04-03 ENCOUNTER — Other Ambulatory Visit: Payer: Self-pay

## 2015-04-03 ENCOUNTER — Telehealth: Payer: Self-pay | Admitting: Oncology

## 2015-04-03 ENCOUNTER — Ambulatory Visit (HOSPITAL_BASED_OUTPATIENT_CLINIC_OR_DEPARTMENT_OTHER): Payer: Medicare HMO

## 2015-04-03 ENCOUNTER — Other Ambulatory Visit (HOSPITAL_BASED_OUTPATIENT_CLINIC_OR_DEPARTMENT_OTHER): Payer: Medicare HMO

## 2015-04-03 VITALS — BP 144/67 | HR 63 | Temp 98.4°F | Resp 20

## 2015-04-03 DIAGNOSIS — Z5189 Encounter for other specified aftercare: Secondary | ICD-10-CM | POA: Diagnosis not present

## 2015-04-03 DIAGNOSIS — C562 Malignant neoplasm of left ovary: Secondary | ICD-10-CM | POA: Diagnosis not present

## 2015-04-03 DIAGNOSIS — C569 Malignant neoplasm of unspecified ovary: Secondary | ICD-10-CM

## 2015-04-03 LAB — CBC WITH DIFFERENTIAL/PLATELET
BASO%: 0 % (ref 0.0–2.0)
Basophils Absolute: 0 10*3/uL (ref 0.0–0.1)
EOS%: 0 % (ref 0.0–7.0)
Eosinophils Absolute: 0 10*3/uL (ref 0.0–0.5)
HEMATOCRIT: 32.1 % — AB (ref 34.8–46.6)
HEMOGLOBIN: 10.3 g/dL — AB (ref 11.6–15.9)
LYMPH#: 0.3 10*3/uL — AB (ref 0.9–3.3)
LYMPH%: 16.4 % (ref 14.0–49.7)
MCH: 27.3 pg (ref 25.1–34.0)
MCHC: 32.1 g/dL (ref 31.5–36.0)
MCV: 85.1 fL (ref 79.5–101.0)
MONO#: 0 10*3/uL — ABNORMAL LOW (ref 0.1–0.9)
MONO%: 0.7 % (ref 0.0–14.0)
NEUT%: 82.9 % — ABNORMAL HIGH (ref 38.4–76.8)
NEUTROS ABS: 1.3 10*3/uL — AB (ref 1.5–6.5)
NRBC: 0 % (ref 0–0)
Platelets: 158 10*3/uL (ref 145–400)
RBC: 3.77 10*6/uL (ref 3.70–5.45)
RDW: 13.3 % (ref 11.2–14.5)
WBC: 1.5 10*3/uL — AB (ref 3.9–10.3)

## 2015-04-03 MED ORDER — TBO-FILGRASTIM 480 MCG/0.8ML ~~LOC~~ SOSY
480.0000 ug | PREFILLED_SYRINGE | Freq: Once | SUBCUTANEOUS | Status: AC
  Administered 2015-04-03: 480 ug via SUBCUTANEOUS
  Filled 2015-04-03: qty 0.8

## 2015-04-03 MED ORDER — IBUPROFEN 600 MG PO TABS: 600.0000 mg | ORAL_TABLET | Freq: Every day | ORAL | Status: DC | PRN

## 2015-04-03 MED ORDER — IBUPROFEN 600 MG PO TABS
600.0000 mg | ORAL_TABLET | Freq: Every day | ORAL | Status: DC | PRN
Start: 2015-04-03 — End: 2015-04-03

## 2015-04-03 NOTE — Progress Notes (Signed)
Dr. Marko Plume notified of [place] 1.3 and WBC 1.5, will hold taxol treatment today and give granix 480 mcg. Aware of pt reporting temp of 99.5 yesterday, normal today. No s/s of infection. Pt educated on granix and infection prevention measures and importance of hand washing. Verbalized understanding, will call with any concerns or s/s infection.

## 2015-04-03 NOTE — Telephone Encounter (Signed)
Appointments made for injections and patient will get today [name]

## 2015-04-03 NOTE — Progress Notes (Signed)
Told [name] that Dr. Marko Plume renewed the Ibuprofen 600 mf with 1 tablet daily prn instead of bid as her platlets may drop with the treatments and the ibuprofen makes the platlets less effective  and she could bleed more easily.[name] verbalized understanding.

## 2015-04-03 NOTE — Patient Instructions (Addendum)
Neutropenia Neutropenia is a condition that occurs when the level of a certain type of white blood cell (neutrophil) in your body becomes lower than normal. Neutrophils are made in the bone marrow and fight infections. These cells protect against bacteria and viruses. The fewer neutrophils you have, and the longer your body remains without them, the greater your risk of getting a severe infection becomes. CAUSES  The cause of neutropenia may be hard to determine. However, it is usually due to 3 main problems:   Decreased production of neutrophils. This may be due to:  Certain medicines such as chemotherapy.  Genetic problems.  Cancer.  Radiation treatments.  Vitamin deficiency.  Some pesticides.  Increased destruction of neutrophils. This may be due to:  Overwhelming infections.  Hemolytic anemia. This is when the body destroys its own blood cells.  Chemotherapy.  Neutrophils moving to areas of the body where they cannot fight infections. This may be due to:  Dialysis procedures.  Conditions where the spleen becomes enlarged. Neutrophils are held in the spleen and are not available to the rest of the body.  Overwhelming infections. The neutrophils are held in the area of the infection and are not available to the rest of the body. SYMPTOMS  There are no specific symptoms of neutropenia. The lack of neutrophils can result in an infection, and an infection can cause various problems. DIAGNOSIS  Diagnosis is made by a blood test. A complete blood count is performed. The normal level of neutrophils in human blood differs with age and race. Infants have lower counts than older children and adults. African Americans have lower counts than Caucasians or Asians. The average adult level is 1500 cells/mm3 of blood. Neutrophil counts are interpreted as follows:  Greater than 1000 cells/mm3 gives normal protection against infection.  500 to 1000 cells/mm3 gives an increased risk for  infection.  200 to 500 cells/mm3 is a greater risk for severe infection.  Lower than 200 cells/mm3 is a marked risk of infection. This may require hospitalization and treatment with antibiotic medicines. TREATMENT  Treatment depends on the underlying cause, severity, and presence of infections or symptoms. It also depends on your health. Your caregiver will discuss the treatment plan with you. Mild cases are often easily treated and have a good outcome. Preventative measures may also be started to limit your risk of infections. Treatment can include:  Taking antibiotics.  Stopping medicines that are known to cause neutropenia.  Correcting nutritional deficiencies by eating green vegetables to supply folic acid and taking vitamin B supplements.  Stopping exposure to pesticides if your neutropenia is related to pesticide exposure.  Taking a blood growth factor called sargramostim, pegfilgrastim, or filgrastim if you are undergoing chemotherapy for cancer. This stimulates white blood cell production.  Removal of the spleen if you have Felty's syndrome and have repeated infections. HOME CARE INSTRUCTIONS   Follow your caregiver's instructions about when you need to have blood work done.  Wash your hands often. Make sure others who come in contact with you also wash their hands.  Wash raw fruits and vegetables before eating them. They can carry bacteria and fungi.  Avoid people with colds or spreadable (contagious) diseases (chickenpox, herpes zoster, influenza).  Avoid large crowds.  Avoid construction areas. The dust can release fungus into the air.  Be cautious around children in daycare or school environments.  Take care of your respiratory system by coughing and deep breathing.  Bathe daily.  Protect your skin from cuts and  burns.  Do not work in the garden or with flowers and plants.  Care for the mouth before and after meals by brushing with a soft toothbrush. If you have  mucositis, do not use mouthwash. Mouthwash contains alcohol and can dry out the mouth even more.  Clean the area between the genitals and the anus (perineal area) after urination and bowel movements. Women need to wipe from front to back.  Use a water soluble lubricant during sexual intercourse and practice good hygiene after. Do not have intercourse if you are severely neutropenic. Check with your caregiver for guidelines.  Exercise daily as tolerated.  Avoid people who were vaccinated with a live vaccine in the past 30 days. You should not receive live vaccines (polio, typhoid).  Do not provide direct care for pets. Avoid animal droppings. Do not clean litter boxes and bird cages.  Do not share food utensils.  Do not use tampons, enemas, or rectal suppositories unless directed by your caregiver.  Use an electric razor to remove hair.  Wash your hands after handling magazines, letters, and newspapers. SEEK IMMEDIATE MEDICAL CARE IF:   You have a fever.  You have chills or start to shake.  You feel nauseous or vomit.  You develop mouth sores.  You develop aches and pains.  You have redness and swelling around open wounds.  Your skin is warm to the touch.  You have pus coming from your wounds.  You develop swollen lymph nodes.  You feel weak or fatigued.  You develop red streaks on the skin. MAKE SURE YOU:  Understand these instructions.  Will watch your condition.  Will get help right away if you are not doing well or get worse.   This information is not intended to replace advice given to you by your health care provider. Make sure you discuss any questions you have with your health care provider.   Document Released: 09/18/2001 Document Revised: 06/21/2011 Document Reviewed: 10/09/2014 Elsevier Interactive Patient Education 2016 [employer]. Tbo-Filgrastim injection What is this medicine? TBO-FILGRASTIM (T B O fil GRA stim) is a granulocyte  colony-stimulating factor that stimulates the growth of neutrophils, a type of white blood cell important in the body's fight against infection. It is used to reduce the incidence of fever and infection in patients with certain types of cancer who are receiving chemotherapy that affects the bone marrow. This medicine may be used for other purposes; ask your health care provider or pharmacist if you have questions. What should I tell my health care provider before I take this medicine? They need to know if you have any of these conditions: -ongoing radiation therapy -sickle cell anemia -an unusual or allergic reaction to tbo-filgrastim, filgrastim, pegfilgrastim, other medicines, foods, dyes, or preservatives -pregnant or trying to get pregnant -breast-feeding How should I use this medicine? This medicine is for injection under the skin. If you get this medicine at home, you will be taught how to prepare and give this medicine. Refer to the Instructions for Use that come with your medication packaging. Use exactly as directed. Take your medicine at regular intervals. Do not take your medicine more often than directed. It is important that you put your used needles and syringes in a special sharps container. Do not put them in a trash can. If you do not have a sharps container, call your pharmacist or healthcare provider to get one. Talk to your pediatrician regarding the use of this medicine in children. Special care may be  needed. Overdosage: If you think you have taken too much of this medicine contact a poison control center or emergency room at once. NOTE: This medicine is only for you. Do not share this medicine with others. What if I miss a dose? It is important not to miss your dose. Call your doctor or health care professional if you miss a dose. What may interact with this medicine? This medicine may interact with the following medications: -medicines that may cause a release of  neutrophils, such as lithium This list may not describe all possible interactions. Give your health care provider a list of all the medicines, herbs, non-prescription drugs, or dietary supplements you use. Also tell them if you smoke, drink alcohol, or use illegal drugs. Some items may interact with your medicine. What should I watch for while using this medicine? You may need blood work done while you are taking this medicine. What side effects may I notice from receiving this medicine? Side effects that you should report to your doctor or health care professional as soon as possible: -allergic reactions like skin rash, itching or hives, swelling of the face, lips, or tongue -shortness of breath or breathing problems -fever -pain, redness, or irritation at site where injected -pinpoint red spots on the skin -stomach or side pain, or pain at the shoulder -swelling -tiredness -trouble passing urine Side effects that usually do not require medical attention (Report these to your doctor or health care professional if they continue or are bothersome.): -bone pain -muscle pain This list may not describe all possible side effects. Call your doctor for medical advice about side effects. You may report side effects to FDA at 1-800-FDA-1088. Where should I keep my medicine? Keep out of the reach of children. Store in a refrigerator between 2 and 8 degrees C (36 and 46 degrees F). Keep in carton to protect from light. Throw away this medicine if it is left out of the refrigerator for more than 5 consecutive days. Throw away any unused medicine after the expiration date. NOTE: This sheet is a summary. It may not cover all possible information. If you have questions about this medicine, talk to your doctor, pharmacist, or health care provider.    2016, Elsevier/Gold Standard. (2013-07-19 11:52:29)

## 2015-04-07 ENCOUNTER — Other Ambulatory Visit: Payer: Self-pay | Admitting: Oncology

## 2015-04-07 DIAGNOSIS — C562 Malignant neoplasm of left ovary: Secondary | ICD-10-CM

## 2015-04-09 ENCOUNTER — Telehealth: Payer: Self-pay | Admitting: *Deleted

## 2015-04-09 ENCOUNTER — Other Ambulatory Visit: Payer: Self-pay | Admitting: Oncology

## 2015-04-09 DIAGNOSIS — C562 Malignant neoplasm of left ovary: Secondary | ICD-10-CM

## 2015-04-09 NOTE — Telephone Encounter (Signed)
Patient called in stating that she is having off and on temperatures since her granix injection last week. Patient states that her temperatures are ranging from 99.0-100.4 with her most recent temp is 99.3. Denies shortness of breath/chest pain. Patient complains of headache and racing heart rate with little chills. Patient is scheduled to see MD [facility] tomorrow. RN instructed patient to keep a note of temperatures. If it rises above 100.5 then notify MD and continue to take her ibuprofen as needed with fevers. Message forwarded to MD Bertram Millard.

## 2015-04-09 NOTE — Telephone Encounter (Signed)
Re low temps  Please get CBC diff today if possible, as counts may be low from recent chemo, had one granix only RN notified

## 2015-04-09 NOTE — Telephone Encounter (Signed)
Spoke with [name] and discussed the rational for checking counts today as the infection fighting WBC'smay be very low and the granix injection would help increase the cells to fight infection. [name] declined to come in as it would take at lest an hour to get to the cancer center.  Her husband needs to need dinner on a regular scheduled.with his diabetes.  Stressed importance of checking counts sooner then later.   [name] feels that she can wait until her appointment tomorrow morning. Told her that if she hd a shaing chill she needs to check her temp.  If temp 100.5 or greater,he needs to go to the [facility] ER. [name] states that she has not had any chills with this treatment.

## 2015-04-10 ENCOUNTER — Encounter: Payer: Self-pay | Admitting: Oncology

## 2015-04-10 ENCOUNTER — Ambulatory Visit (HOSPITAL_BASED_OUTPATIENT_CLINIC_OR_DEPARTMENT_OTHER): Payer: Medicare HMO

## 2015-04-10 ENCOUNTER — Other Ambulatory Visit (HOSPITAL_BASED_OUTPATIENT_CLINIC_OR_DEPARTMENT_OTHER): Payer: Medicare HMO

## 2015-04-10 ENCOUNTER — Ambulatory Visit (HOSPITAL_BASED_OUTPATIENT_CLINIC_OR_DEPARTMENT_OTHER): Payer: Medicare HMO | Admitting: Oncology

## 2015-04-10 VITALS — BP 105/72 | HR 73 | Temp 97.4°F | Resp 20

## 2015-04-10 DIAGNOSIS — C562 Malignant neoplasm of left ovary: Secondary | ICD-10-CM

## 2015-04-10 DIAGNOSIS — R300 Dysuria: Secondary | ICD-10-CM

## 2015-04-10 DIAGNOSIS — C569 Malignant neoplasm of unspecified ovary: Secondary | ICD-10-CM

## 2015-04-10 DIAGNOSIS — Z5111 Encounter for antineoplastic chemotherapy: Secondary | ICD-10-CM

## 2015-04-10 DIAGNOSIS — B372 Candidiasis of skin and nail: Secondary | ICD-10-CM

## 2015-04-10 LAB — COMPREHENSIVE METABOLIC PANEL
ALBUMIN: 3.5 g/dL (ref 3.5–5.0)
ALK PHOS: 82 U/L (ref 40–150)
ALT: 24 U/L (ref 0–55)
ANION GAP: 9 meq/L (ref 3–11)
AST: 18 U/L (ref 5–34)
BILIRUBIN TOTAL: 0.35 mg/dL (ref 0.20–1.20)
BUN: 22.1 mg/dL (ref 7.0–26.0)
CO2: 25 meq/L (ref 22–29)
CREATININE: 1.1 mg/dL (ref 0.6–1.1)
Calcium: 9.1 mg/dL (ref 8.4–10.4)
Chloride: 103 mEq/L (ref 98–109)
EGFR: 60 mL/min/{1.73_m2} — AB (ref >90)
GLUCOSE: 105 mg/dL (ref 70–140)
Potassium: 3.7 mEq/L (ref 3.5–5.1)
SODIUM: 137 meq/L (ref 136–145)
TOTAL PROTEIN: 8.1 g/dL (ref 6.4–8.3)

## 2015-04-10 LAB — CBC WITH DIFFERENTIAL/PLATELET
BASO%: 0.2 % (ref 0.0–2.0)
BASOS ABS: 0 10*3/uL (ref 0.0–0.1)
EOS ABS: 0.1 10*3/uL (ref 0.0–0.5)
EOS%: 1.5 % (ref 0.0–7.0)
HCT: 33.1 % — ABNORMAL LOW (ref 34.8–46.6)
HGB: 10.8 g/dL — ABNORMAL LOW (ref 11.6–15.9)
LYMPH#: 1.2 10*3/uL (ref 0.9–3.3)
LYMPH%: 24.9 % (ref 14.0–49.7)
MCH: 27.8 pg (ref 25.1–34.0)
MCHC: 32.6 g/dL (ref 31.5–36.0)
MCV: 85.1 fL (ref 79.5–101.0)
MONO#: 0.7 10*3/uL (ref 0.1–0.9)
MONO%: 14.5 % — ABNORMAL HIGH (ref 0.0–14.0)
NEUT#: 2.7 10*3/uL (ref 1.5–6.5)
NEUT%: 58.9 % (ref 38.4–76.8)
PLATELETS: 104 10*3/uL — AB (ref 145–400)
RBC: 3.89 10*6/uL (ref 3.70–5.45)
RDW: 13.4 % (ref 11.2–14.5)
WBC: 4.6 10*3/uL (ref 3.9–10.3)

## 2015-04-10 LAB — URINALYSIS, MICROSCOPIC - CHCC
Bilirubin (Urine): NEGATIVE
Blood: NEGATIVE
Glucose: NEGATIVE mg/dL
Ketones: NEGATIVE mg/dL
LEUKOCYTE ESTERASE: NEGATIVE
NITRITE: NEGATIVE
Protein: NEGATIVE mg/dL
RBC / HPF: NEGATIVE (ref 0–2)
Specific Gravity, Urine: 1.01 (ref 1.003–1.035)
UROBILINOGEN UR: 0.2 mg/dL (ref 0.2–1)
pH: 6 (ref 4.6–8.0)

## 2015-04-10 LAB — HOLD TUBE, BLOOD BANK

## 2015-04-10 MED ORDER — NYSTATIN 100000 UNIT/GM EX POWD: CUTANEOUS | Status: DC

## 2015-04-10 MED ORDER — FAMOTIDINE IN NACL 20-0.9 MG/50ML-% IV SOLN
INTRAVENOUS | Status: AC
  Filled 2015-04-10: qty 50

## 2015-04-10 MED ORDER — DIPHENHYDRAMINE HCL 50 MG/ML IJ SOLN
50.0000 mg | Freq: Once | INTRAMUSCULAR | Status: AC
  Administered 2015-04-10: 50 mg via INTRAVENOUS

## 2015-04-10 MED ORDER — DEXTROSE 5 % IV SOLN
80.0000 mg/m2 | Freq: Once | INTRAVENOUS | Status: AC
  Administered 2015-04-10: 192 mg via INTRAVENOUS
  Filled 2015-04-10: qty 32

## 2015-04-10 MED ORDER — LORAZEPAM 1 MG PO TABS
ORAL_TABLET | ORAL | Status: AC
  Filled 2015-04-10: qty 1

## 2015-04-10 MED ORDER — SODIUM CHLORIDE 0.9 % IV SOLN
Freq: Once | INTRAVENOUS | Status: AC
  Administered 2015-04-10: 14:00:00 via INTRAVENOUS
  Filled 2015-04-10: qty 4

## 2015-04-10 MED ORDER — DIPHENHYDRAMINE HCL 50 MG/ML IJ SOLN
INTRAMUSCULAR | Status: AC
  Filled 2015-04-10: qty 1

## 2015-04-10 MED ORDER — SODIUM CHLORIDE 0.9 % IV SOLN
Freq: Once | INTRAVENOUS | Status: AC
  Administered 2015-04-10: 13:00:00 via INTRAVENOUS

## 2015-04-10 MED ORDER — FAMOTIDINE IN NACL 20-0.9 MG/50ML-% IV SOLN
20.0000 mg | Freq: Once | INTRAVENOUS | Status: AC
Start: 2015-04-10 — End: 2015-04-10
  Administered 2015-04-10: 20 mg via INTRAVENOUS

## 2015-04-10 MED ORDER — LORAZEPAM 1 MG PO TABS
1.0000 mg | ORAL_TABLET | Freq: Once | ORAL | Status: AC | PRN
  Administered 2015-04-10: 1 mg via ORAL

## 2015-04-10 NOTE — Progress Notes (Signed)
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 Day 8 cycle 1 was held with leukopenia, ANC adequate today after granix x1 on day 8.  Patient called office on 12-28 to report low grade fevers over past few days, however she declined to come for CBC then. She has some mild bladder symptoms without other obvious symptoms of infection. She noticed slight bleeding from surgical wound today, has been using mycostatin powder in skin folds there. No other bleeding.Bowels are moving well with miralax and she has had no significant nausea. Scalp is sore at times, no hair loss yet. She did not take premed decadron for taxol today. She denies SOB or cough.  Remainder of 10 point Review of Systems negative/ unchanged.     No central line Refused flu vaccine  No genetics testing CA 125 not marker (10 preop)  ONCOLOGIC HISTORY Patient has been followed closely by Dr Corinna Capra of gyn, including hysteroscopy D & C 08-2013 for bleeding, with benign endometrial polyp then ([id number]). She had CT at outside facility 10-5100 because of umbilical hernia, which showed 6 cm left adnexal mass with imaging consistent with ovarian cyst. She had follow up imaging in 12-2014 with cyst 6.9 cm, not complex, and CA 125 normal at 10 on 12-18-14. By repeat US 01-23-15 the area measured 9.7 x 7.0 x 6.4 cm with normal flow to ovary, no flow to cyst and no free fluid, endometrial stripe 3.9 mm; patient was then having some pelvic pressure. She had abdominal hysterectomy BSO by Dr Corinna Capra on 02-11-15, with operative findings of adhesions from left adnexal mass to bowel and pelvic sidewall, no ascites, normal appearing omentum, normal appendix; there was  unavoidable intraoperative rupture during surgery. Pathology [id number]) left clear cell ovarian carcinoma at least 7 cy with capsule rupture, benign left tube, right ovary and tube and uterus. Cytology of washings negative for malignancy ([id number]). Post operative course has been uncomplicated. CT [facility] 03-07-15 showed no apparent metastatic disease, with hepatic steatosis, abdominal aortic atherosclerosis and some fluid at paraumbilical hernia. Patient saw Dr Denman George in consultation on 02-24-15, with recommendation for 6 cycles carboplatin taxol adjuvantly. She had day 1 cycle 1 dose dense carbo taxol on12-14-16.   Objective:  VItals per chemo flow sheets  Alert, oriented and appropriate, talkative, cooperative. Appears comfortable in recliner, respirations not labored RA. Peripheral IV infusing without difficulty.  No alopecia  HEENT:PERRL, sclerae not icteric. Oral mucosa moist without lesions. Scalp with single excoriated area otherwise visually not remarkable but tender consistent with beginning of hair loss. Neck supple. No JVD.  Lymphatics:no cervical,supraclavicular adenopathy Resp: clear to auscultation bilaterally  Cardio: regular rate and rhythm. No gallop. GI: abdomen obese, soft, nontender, not distended.. Normally active bowel sounds. Surgical incision minimally open lateral right with spots of dried blood, deep skin folds wet but cutaneous yeast improved from last exam. Area dried and gauze placed in the skin folds Musculoskeletal/ Extremities: without pitting edema, cords, tenderness Neuro: no peripheral neuropathy. Otherwise nonfocal. PSYCH appropriate mood and affect Skin without rash, ecchymosis, petechiae   Lab Results:  Results for orders placed or performed in visit on 04/10/15  CBC with Differential  Result Value Ref Range   WBC 4.6 3.9 -  10.3 10e3/uL   NEUT# 2.7 1.5 - 6.5 10e3/uL   HGB 10.8 (L) 11.6 - 15.9 g/dL   HCT 33.1 (L) 34.8 - 46.6 %   Platelets 104 (L) 145  - 400 10e3/uL   MCV 85.1 79.5 - 101.0 fL   MCH 27.8 25.1 - 34.0 pg   MCHC 32.6 31.5 - 36.0 g/dL   RBC 3.89 3.70 - 5.45 10e6/uL   RDW 13.4 11.2 - 14.5 %   lymph# 1.2 0.9 - 3.3 10e3/uL   MONO# 0.7 0.1 - 0.9 10e3/uL   Eosinophils Absolute 0.1 0.0 - 0.5 10e3/uL   Basophils Absolute 0.0 0.0 - 0.1 10e3/uL   NEUT% 58.9 38.4 - 76.8 %   LYMPH% 24.9 14.0 - 49.7 %   MONO% 14.5 (H) 0.0 - 14.0 %   EOS% 1.5 0.0 - 7.0 %   BASO% 0.2 0.0 - 2.0 %  Comprehensive metabolic panel  Result Value Ref Range   Sodium 137 136 - 145 mEq/L   Potassium 3.7 3.5 - 5.1 mEq/L   Chloride 103 98 - 109 mEq/L   CO2 25 22 - 29 mEq/L   Glucose 105 70 - 140 mg/dl   BUN 22.1 7.0 - 26.0 mg/dL   Creatinine 1.1 0.6 - 1.1 mg/dL   Total Bilirubin 0.35 0.20 - 1.20 mg/dL   Alkaline Phosphatase 82 40 - 150 U/L   AST 18 5 - 34 U/L   ALT 24 0 - 55 U/L   Total Protein 8.1 6.4 - 8.3 g/dL   Albumin 3.5 3.5 - 5.0 g/dL   Calcium 9.1 8.4 - 10.4 mg/dL   Anion Gap 9 3 - 11 mEq/L   EGFR 60 (L) >90 ml/min/1.73 m2   UA obtained following MD evaluation 0-2 WBC, LE negative. Culture pending but no indication for antibiotics here  Studies/Results:  No results found.  Medications: I have reviewed the patient's current medications. Reminded her to take decadron 20 mg 12 hrs prior to chemo. Mycostatin powder.  DISCUSSION All of interval history and concerns reviewed as above. Will follow up results of urine culture and patient is to call for temp >=100.5 or symptoms of infection. She is to keep the area of surgical wound dry, with soft absorbant material placed after mycostatin powder. Suggested cutting hair short so easier to manage as she will likely have hair loss.   Patient understands that she will be monitored closely, but will proceed with treatment today despite her not having taken premed decadron as directed.  Assessment/Plan:  1. Clear cell carcinoma of ovary clinically IC: found incidentally on CT done for unrelated  umbilical hernia, normal CA 125, post abdominal hysterectomy BSO by Dr Corinna Capra 02-11-15 and for 6 cycles carboplatin taxol begun 03-26-15 using dose dense regimen, day 8 held with ANC 1.3.  Premed decadron 20 mg 12 hrs prior to each taxol. I will see her on 1-3 prior to day 1 cycle 2 on 04-17-15.  2.morbid obesity, BMI 53. Will ask [place] dietician to see 3.asthma: on regular treatment, exacerbations generally with respiratory illnesses 4.refuses flu vaccine: we have discussed Tamiflu 5.peripheral IV access adequate now 6.history of crohn's disease/ ileitis, which has not required intervention in past 10 years. Known to [facility] 7.history of melanoma right posterior thigh 2003 with 2 sentinel nodes negative. Followed by dermatology 8.umbilical hernia since 6967, stable 9.multiple drug intolerances. EMR notes is able to take Rocephin and Keflex 10.HTN x ~ 10 years followed now by PCP 11.GERD longstanding and unchanged  12.hepatic steatosis by CT 13. Thoracic and abdominal aortic atherosclerosis by CT 14. History of chronic anxiety and panic attacks  Questions answered and patient knows that she can call if concerns prior to next scheduled visit. Chemo orders confirmed. Time spent 25 min including >50% counseling and coordination of care   LIVESAY,LENNIS P, MD   04/10/2015, 12:17 PM

## 2015-04-10 NOTE — Progress Notes (Signed)
[name] arrived to tx room complaining of "extreme tiredness". [name] and husband are both wearing masks. [name]'s vitals signs stable. [name] arrived believing that she would not be getting today's chemo tx. So she has not taken her Decadron today. [name] also shard that her hair is falling out and that she has multiple sores on her head. She states that she had sores prior to initial chemo secondary to hair dyeing but they seem "a little worse now. [name] does not want to continue with treatment until she speaks with Dr. Marko Plume. Barbaraann Share spoke with pt at bedside and instructed to not start IV until pt seen by MD.

## 2015-04-10 NOTE — Patient Instructions (Signed)
[facility] Discharge Instructions for Patients Receiving Chemotherapy  Today you received the following chemotherapy agents Taxol.  To help prevent nausea and vomiting after your treatment, we encourage you to take your nausea medication as prescribed.   If you develop nausea and vomiting that is not controlled by your nausea medication, call the clinic.   BELOW ARE SYMPTOMS THAT SHOULD BE REPORTED IMMEDIATELY:  *FEVER GREATER THAN 100.5 F  *CHILLS WITH OR WITHOUT FEVER  NAUSEA AND VOMITING THAT IS NOT CONTROLLED WITH YOUR NAUSEA MEDICATION  *UNUSUAL SHORTNESS OF BREATH  *UNUSUAL BRUISING OR BLEEDING  TENDERNESS IN MOUTH AND THROAT WITH OR WITHOUT PRESENCE OF ULCERS  *URINARY PROBLEMS  *BOWEL PROBLEMS  UNUSUAL RASH Items with * indicate a potential emergency and should be followed up as soon as possible.  Feel free to call the clinic you have any questions or concerns. The clinic phone number is [phone].  Please show the [place] at check-in to the Emergency Department and triage nurse.  Take Ativan and not Xanax. If you need to take the Xanax make sure it is at least 4 hours apart from taking the Ativan. Be sure to take Zofran in the morning.

## 2015-04-11 ENCOUNTER — Ambulatory Visit (HOSPITAL_BASED_OUTPATIENT_CLINIC_OR_DEPARTMENT_OTHER): Payer: Medicare HMO

## 2015-04-11 VITALS — BP 139/78 | HR 85 | Temp 98.3°F

## 2015-04-11 DIAGNOSIS — Z5189 Encounter for other specified aftercare: Secondary | ICD-10-CM | POA: Diagnosis not present

## 2015-04-11 DIAGNOSIS — C569 Malignant neoplasm of unspecified ovary: Secondary | ICD-10-CM | POA: Diagnosis not present

## 2015-04-11 LAB — URINE CULTURE

## 2015-04-11 MED ORDER — TBO-FILGRASTIM 480 MCG/0.8ML ~~LOC~~ SOSY
480.0000 ug | PREFILLED_SYRINGE | Freq: Once | SUBCUTANEOUS | Status: AC
  Administered 2015-04-11: 480 ug via SUBCUTANEOUS
  Filled 2015-04-11: qty 0.8

## 2015-04-13 ENCOUNTER — Other Ambulatory Visit: Payer: Self-pay | Admitting: Oncology

## 2015-04-15 ENCOUNTER — Ambulatory Visit (HOSPITAL_BASED_OUTPATIENT_CLINIC_OR_DEPARTMENT_OTHER): Payer: Medicare HMO | Admitting: Oncology

## 2015-04-15 ENCOUNTER — Encounter: Payer: Self-pay | Admitting: Oncology

## 2015-04-15 ENCOUNTER — Other Ambulatory Visit (HOSPITAL_BASED_OUTPATIENT_CLINIC_OR_DEPARTMENT_OTHER): Payer: Medicare HMO

## 2015-04-15 VITALS — BP 140/60 | HR 82 | Temp 98.1°F | Resp 18 | Ht 62.0 in | Wt 291.7 lb

## 2015-04-15 DIAGNOSIS — D6481 Anemia due to antineoplastic chemotherapy: Secondary | ICD-10-CM

## 2015-04-15 DIAGNOSIS — L738 Other specified follicular disorders: Secondary | ICD-10-CM | POA: Diagnosis not present

## 2015-04-15 DIAGNOSIS — D701 Agranulocytosis secondary to cancer chemotherapy: Secondary | ICD-10-CM

## 2015-04-15 DIAGNOSIS — C562 Malignant neoplasm of left ovary: Secondary | ICD-10-CM

## 2015-04-15 DIAGNOSIS — T451X5A Adverse effect of antineoplastic and immunosuppressive drugs, initial encounter: Secondary | ICD-10-CM

## 2015-04-15 DIAGNOSIS — L739 Follicular disorder, unspecified: Secondary | ICD-10-CM

## 2015-04-15 LAB — COMPREHENSIVE METABOLIC PANEL
ALBUMIN: 3.3 g/dL — AB (ref 3.5–5.0)
ALK PHOS: 84 U/L (ref 40–150)
ALT: 23 U/L (ref 0–55)
AST: 19 U/L (ref 5–34)
Anion Gap: 6 mEq/L (ref 3–11)
BUN: 23.8 mg/dL (ref 7.0–26.0)
CALCIUM: 9.1 mg/dL (ref 8.4–10.4)
CO2: 31 mEq/L — ABNORMAL HIGH (ref 22–29)
CREATININE: 0.9 mg/dL (ref 0.6–1.1)
Chloride: 101 mEq/L (ref 98–109)
EGFR: 75 mL/min/{1.73_m2} — ABNORMAL LOW (ref >90)
Glucose: 92 mg/dl (ref 70–140)
Potassium: 3.8 mEq/L (ref 3.5–5.1)
Sodium: 138 mEq/L (ref 136–145)
Total Bilirubin: 0.39 mg/dL (ref 0.20–1.20)
Total Protein: 7.4 g/dL (ref 6.4–8.3)

## 2015-04-15 LAB — CBC WITH DIFFERENTIAL/PLATELET
BASO%: 0.5 % (ref 0.0–2.0)
BASOS ABS: 0 10*3/uL (ref 0.0–0.1)
EOS%: 0.6 % (ref 0.0–7.0)
Eosinophils Absolute: 0 10*3/uL (ref 0.0–0.5)
HEMATOCRIT: 29.8 % — AB (ref 34.8–46.6)
HEMOGLOBIN: 9.7 g/dL — AB (ref 11.6–15.9)
LYMPH#: 0.7 10*3/uL — AB (ref 0.9–3.3)
LYMPH%: 23.5 % (ref 14.0–49.7)
MCH: 27.1 pg (ref 25.1–34.0)
MCHC: 32.5 g/dL (ref 31.5–36.0)
MCV: 83.3 fL (ref 79.5–101.0)
MONO#: 0.1 10*3/uL (ref 0.1–0.9)
MONO%: 4.4 % (ref 0.0–14.0)
NEUT#: 2.1 10*3/uL (ref 1.5–6.5)
NEUT%: 71 % (ref 38.4–76.8)
Platelets: 164 10*3/uL (ref 145–400)
RBC: 3.58 10*6/uL — ABNORMAL LOW (ref 3.70–5.45)
RDW: 13.7 % (ref 11.2–14.5)
WBC: 2.9 10*3/uL — ABNORMAL LOW (ref 3.9–10.3)

## 2015-04-15 NOTE — Progress Notes (Signed)
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 Day 8 cycle 1 was held with chemo leukopenia, now using granix after each treatment. She is due day 1 cycle 2 on 04-17-15.   Patient has tolerated chemotherapy adequately thus far, tho felt weak today when in shower; she was able to walk into office without difficulty and does not feel weak now. She was more constipated after day 15 cycle 1 on 04-10-15, bowels finally moving several times yesterday after miralax and stool softeners; likely will do best using stool softener day before chemo and miralax day of chemo. She had some BRB with very hard stool initially. No other bleeding.She has had no fever >=100.5. She is eating and tries to push fluids. She had some aches in legs, not severe, after taxol/ granix. She did not tolerate 50 mg benadryl dose with first chemo. She has some scattered folliculitis upper torso and scalp, is losing hair. No LE swelling. No SOB with present activity level. Bladder ok. Peripheral IV access has been adequate. Remainder of 10 point Review of Systems negative/ unchanged.   No central line Refused flu vaccine. Discussed again at this visit, still declines. No genetics testing CA 125 not marker (10 preop)    ONCOLOGIC HISTORY Patient has been followed closely by Dr Corinna Capra of gyn, including hysteroscopy D & C 08-2013 for bleeding, with benign endometrial polyp then ([id number]). She had CT at outside facility 05-9474 because of umbilical hernia, which showed 6 cm left adnexal mass with imaging consistent with ovarian cyst. She had follow up imaging in 12-2014 with cyst 6.9 cm, not complex, and CA 125 normal at 10 on  12-18-14. By repeat US 01-23-15 the area measured 9.7 x 7.0 x 6.4 cm with normal flow to ovary, no flow to cyst and no free fluid, endometrial stripe 3.9 mm; patient was then having some pelvic pressure. She had abdominal hysterectomy BSO by Dr Corinna Capra on 02-11-15, with operative findings of adhesions from left adnexal mass to bowel and pelvic sidewall, no ascites, normal appearing omentum, normal appendix; there was unavoidable intraoperative rupture during surgery. Pathology [phone]) left clear cell ovarian carcinoma at least 7 cy with capsule rupture, benign left tube, right ovary and tube and uterus. Cytology of washings negative for malignancy ([id number]). Post operative course has been uncomplicated. CT CAP 03-07-15 showed no apparent metastatic disease, with hepatic steatosis, abdominal aortic atherosclerosis and some fluid at paraumbilical hernia. Patient saw Dr Denman George in consultation on 02-24-15, with recommendation for 6 cycles carboplatin taxol adjuvantly. She had day 1 cycle 1 dose dense carbo taxol on12-14-16. Day 8 cycle 1 held with ANC 1.3, granix 480 added day after each treatment.    Objective:  Vital signs in last 24 hours:  BP 140/60 mmHg  Pulse 82  Temp(Src) 98.1 F (36.7 C) (Oral)  Resp 18  Ht 5' 2"  (1.575 m)  Wt 291 lb 11.2 oz (132.314 kg)  BMI 53.34 kg/m2  SpO2 99% Looks comfortable, talkative, respirations not labored RA Alert, oriented and appropriate. Ambulatory without assistance.  Partial alopecia  HEENT:PERRL, sclerae not icteric. Oral mucosa a little dry without lesions, posterior pharynx clear.  Neck supple. No JVD. Hair matted and coming out Lymphatics:no cervical,supraclavicular,  axillary or inguinal adenopathy Resp: clear to auscultation bilaterally and normal percussion bilaterally Cardio: regular rate and rhythm. No gallop. GI: abdomen obese, soft, nontender, not distended, cannot appreciate mass or organomegaly. Umbilical hernia unchanged. Normally active  bowel sounds. Surgical incision improved from last week, no open areas, no bleeding, and skin irritation improved including upper inner left thigh Musculoskeletal/ Extremities: without pitting edema, cords, tenderness Neuro: no peripheral neuropathy. Otherwise nonfocal. PSYCH appropriate mood and affect Skin scattered folliculitis upper back and scalp, none with obvious suprainfection, no fluctuance or unusual tenderness. Skin otherwise without rash, ecchymosis, petechiae   Lab Results:  Results for orders placed or performed in visit on 04/15/15  CBC with Differential  Result Value Ref Range   WBC 2.9 (L) 3.9 - 10.3 10e3/uL   NEUT# 2.1 1.5 - 6.5 10e3/uL   HGB 9.7 (L) 11.6 - 15.9 g/dL   HCT 29.8 (L) 34.8 - 46.6 %   Platelets 164 145 - 400 10e3/uL   MCV 83.3 79.5 - 101.0 fL   MCH 27.1 25.1 - 34.0 pg   MCHC 32.5 31.5 - 36.0 g/dL   RBC 3.58 (L) 3.70 - 5.45 10e6/uL   RDW 13.7 11.2 - 14.5 %   lymph# 0.7 (L) 0.9 - 3.3 10e3/uL   MONO# 0.1 0.1 - 0.9 10e3/uL   Eosinophils Absolute 0.0 0.0 - 0.5 10e3/uL   Basophils Absolute 0.0 0.0 - 0.1 10e3/uL   NEUT% 71.0 38.4 - 76.8 %   LYMPH% 23.5 14.0 - 49.7 %   MONO% 4.4 0.0 - 14.0 %   EOS% 0.6 0.0 - 7.0 %   BASO% 0.5 0.0 - 2.0 %  Comprehensive metabolic panel  Result Value Ref Range   Sodium 138 136 - 145 mEq/L   Potassium 3.8 3.5 - 5.1 mEq/L   Chloride 101 98 - 109 mEq/L   CO2 31 (H) 22 - 29 mEq/L   Glucose 92 70 - 140 mg/dl   BUN 23.8 7.0 - 26.0 mg/dL   Creatinine 0.9 0.6 - 1.1 mg/dL   Total Bilirubin 0.39 0.20 - 1.20 mg/dL   Alkaline Phosphatase 84 40 - 150 U/L   AST 19 5 - 34 U/L   ALT 23 0 - 55 U/L   Total Protein 7.4 6.4 - 8.3 g/dL   Albumin 3.3 (L) 3.5 - 5.0 g/dL   Calcium 9.1 8.4 - 10.4 mg/dL   Anion Gap 6 3 - 11 mEq/L   EGFR 75 (L) >90 ml/min/1.73 m2    Urine culture negative 12-29  Studies/Results:  No results found.  Medications: I have reviewed the patient's current medications. Start oral iron as name brand or  generic ferrous fumarate DAW, on empty stomach with Vit C tablet once daily. Topical neosporin to areas of folliculitis. Continue mycostatin powder to deep skin folds at surgical incision Ondansetron copay too high so did not fill that script, has phenergan and ativan at home Benadryl dose decreased with chemo premeds Premed oral decadron is 20 mg 12 hrs prior May not need anxiety meds prior to coming for chemo as she gets more accustomed to situation  DISCUSSION Medications as above.  Wash scalp with baby shampoo or other gentle shampoo daily. Continue skin care at surgical incision.  Push po fluids.  Try to increase activity in home as tolerated Flu vaccine as noted above. I have told patient that flu is present locally, and that contracting flu while on chemotherapy can cause severe illness. Mentiioned tamiflu if symptoms of influenza. Husband  did have flu vaccine in Oct. She prefers not to have flu vaccine, as 1 of 2 previous flu vaccines was temporally associated with illness lasting 6 weeks.  Assessment/Plan:   1. Clear cell carcinoma of ovary clinically IC: found incidentally on CT done for unrelated umbilical hernia, normal CA 125, post abdominal hysterectomy BSO by Dr Corinna Capra 02-11-15 and for 6 cycles carboplatin taxol begun 03-26-15 using dose dense regimen, day 8 held with ANC 1.3. Premed decadron 20 mg 12 hrs prior to each taxol.  Labs ok today for day 1cycle 2 on 04-17-15, with granix on 04-18-15. She will have day 8 cycle 2 on 1-12 as long as [place] >=1.5 and plt >=100k, granix day 9. I will see her with day 15 cycle 2 on 05-01-15.  2.folliculitis scalp and upper back: some prior to start of chemo, may be exacerbated by steroids. Wash with gentle shampoo, topical antibiotic.  3.asthma: on regular treatment, exacerbations generally with respiratory illnesses 4.refuses flu vaccine:  discussed Tamiflu, either to have available or prescription at onset of symptoms or if known  exposure 5.peripheral IV access adequate now 6.history of crohn's disease/ ileitis, which has not required intervention in past 10 years. Known to [facility] 7.history of melanoma right posterior thigh 2003 with 2 sentinel nodes negative. Followed by dermatology 8.umbilical hernia since 4497, stable 9.multiple drug intolerances. EMR notes is able to take Rocephin and Keflex 10.HTN x ~ 10 years followed now by PCP 11.GERD longstanding and unchanged 12.hepatic steatosis by CT 13. Thoracic and abdominal aortic atherosclerosis by CT 14. History of chronic anxiety and panic attacks 15.morbid obesity, BMI >50 16.multiple drug and antibiotic intolerances.   All questions answered, patient and husband know to call if needed. Chemo and granix orders confirmed. Time spent 30 min including >50% counseling and coordination of care. Cc Dr Becky Sax, MD   04/16/2015, 3:49 PM

## 2015-04-16 ENCOUNTER — Telehealth: Payer: Self-pay

## 2015-04-16 DIAGNOSIS — C562 Malignant neoplasm of left ovary: Secondary | ICD-10-CM

## 2015-04-16 DIAGNOSIS — T451X5A Adverse effect of antineoplastic and immunosuppressive drugs, initial encounter: Secondary | ICD-10-CM

## 2015-04-16 DIAGNOSIS — D701 Agranulocytosis secondary to cancer chemotherapy: Secondary | ICD-10-CM

## 2015-04-16 DIAGNOSIS — L739 Follicular disorder, unspecified: Secondary | ICD-10-CM | POA: Insufficient documentation

## 2015-04-16 DIAGNOSIS — D6481 Anemia due to antineoplastic chemotherapy: Secondary | ICD-10-CM | POA: Insufficient documentation

## 2015-04-16 HISTORY — DX: Anemia due to antineoplastic chemotherapy: D64.81

## 2015-04-16 HISTORY — DX: Agranulocytosis secondary to cancer chemotherapy: D70.1

## 2015-04-16 MED ORDER — FERROUS FUMARATE 324 (106 FE) MG PO TABS
1.0000 | ORAL_TABLET | Freq: Every day | ORAL | Status: DC
Start: 2015-04-16 — End: 2016-03-22

## 2015-04-16 NOTE — Telephone Encounter (Signed)
-----   Message from Gordy Levan, MD sent at 04/16/2015  3:09 PM EST ----- OK for chemo tomorrow unless temp >=100.5 or other symptoms of infection. We think loose stools are related to stool softener and laxative, but if continue tomorrow would give collection equipment and instructions to check C diff.   Remind her to use topical neosporin to the scalp folliculitis.  Thank you

## 2015-04-16 NOTE — Telephone Encounter (Signed)
lvm per Dr Edwyna Shell attached note.

## 2015-04-16 NOTE — Telephone Encounter (Signed)
-----   Message from Gordy Levan, MD sent at 04/15/2015  2:17 PM EST ----- Needs either Hemocyte if insurance will cover or tell pharmacist to give her Ferrous fumarate no substitute ~ 324 mg one daily on empty stomach with Vit C tablet  #30  2 RF

## 2015-04-16 NOTE — Telephone Encounter (Signed)
Returning pt call. Pt had fever all night 120am it was 99.1, at 318am it was 99.9. She took ibuprfen after 318 am and today it is back to 98.8. She had reflux last night and she had diarrhea 2-3 times, today still loose stool but not as watery. No vomiting. She went out to eat last night at a buffet. She is asking about taking chemo tomorrow.

## 2015-04-16 NOTE — Telephone Encounter (Signed)
Called rx into pharmacy. Also LVM with patient.

## 2015-04-17 ENCOUNTER — Other Ambulatory Visit: Payer: Self-pay | Admitting: Oncology

## 2015-04-17 ENCOUNTER — Telehealth: Payer: Self-pay

## 2015-04-17 ENCOUNTER — Ambulatory Visit (HOSPITAL_BASED_OUTPATIENT_CLINIC_OR_DEPARTMENT_OTHER): Payer: Medicare HMO

## 2015-04-17 VITALS — BP 128/70 | HR 85 | Temp 98.8°F | Resp 18

## 2015-04-17 DIAGNOSIS — I10 Essential (primary) hypertension: Secondary | ICD-10-CM

## 2015-04-17 DIAGNOSIS — Z85820 Personal history of malignant melanoma of skin: Secondary | ICD-10-CM

## 2015-04-17 DIAGNOSIS — B3731 Acute candidiasis of vulva and vagina: Secondary | ICD-10-CM

## 2015-04-17 DIAGNOSIS — R197 Diarrhea, unspecified: Secondary | ICD-10-CM

## 2015-04-17 DIAGNOSIS — L03119 Cellulitis of unspecified part of limb: Secondary | ICD-10-CM

## 2015-04-17 DIAGNOSIS — C562 Malignant neoplasm of left ovary: Secondary | ICD-10-CM

## 2015-04-17 DIAGNOSIS — C569 Malignant neoplasm of unspecified ovary: Secondary | ICD-10-CM

## 2015-04-17 DIAGNOSIS — Z5111 Encounter for antineoplastic chemotherapy: Secondary | ICD-10-CM

## 2015-04-17 DIAGNOSIS — D6481 Anemia due to antineoplastic chemotherapy: Secondary | ICD-10-CM

## 2015-04-17 DIAGNOSIS — Z6841 Body Mass Index (BMI) 40.0 and over, adult: Secondary | ICD-10-CM

## 2015-04-17 DIAGNOSIS — D701 Agranulocytosis secondary to cancer chemotherapy: Secondary | ICD-10-CM

## 2015-04-17 DIAGNOSIS — Z9889 Other specified postprocedural states: Secondary | ICD-10-CM

## 2015-04-17 DIAGNOSIS — L739 Follicular disorder, unspecified: Secondary | ICD-10-CM

## 2015-04-17 DIAGNOSIS — L88 Pyoderma gangrenosum: Secondary | ICD-10-CM

## 2015-04-17 DIAGNOSIS — K429 Umbilical hernia without obstruction or gangrene: Secondary | ICD-10-CM

## 2015-04-17 DIAGNOSIS — T451X5A Adverse effect of antineoplastic and immunosuppressive drugs, initial encounter: Principal | ICD-10-CM

## 2015-04-17 DIAGNOSIS — F411 Generalized anxiety disorder: Secondary | ICD-10-CM

## 2015-04-17 DIAGNOSIS — K625 Hemorrhage of anus and rectum: Secondary | ICD-10-CM

## 2015-04-17 DIAGNOSIS — B373 Candidiasis of vulva and vagina: Secondary | ICD-10-CM

## 2015-04-17 DIAGNOSIS — L02419 Cutaneous abscess of limb, unspecified: Secondary | ICD-10-CM

## 2015-04-17 LAB — CBC WITH DIFFERENTIAL/PLATELET
BASO%: 1 % (ref 0.0–2.0)
Basophils Absolute: 0 10*3/uL (ref 0.0–0.1)
EOS ABS: 0 10*3/uL (ref 0.0–0.5)
EOS%: 0.7 % (ref 0.0–7.0)
HCT: 30.1 % — ABNORMAL LOW (ref 34.8–46.6)
HGB: 9.7 g/dL — ABNORMAL LOW (ref 11.6–15.9)
LYMPH%: 26.8 % (ref 14.0–49.7)
MCH: 27.6 pg (ref 25.1–34.0)
MCHC: 32.2 g/dL (ref 31.5–36.0)
MCV: 85.5 fL (ref 79.5–101.0)
MONO#: 0.4 10*3/uL (ref 0.1–0.9)
MONO%: 11.8 % (ref 0.0–14.0)
NEUT%: 59.7 % (ref 38.4–76.8)
NEUTROS ABS: 1.8 10*3/uL (ref 1.5–6.5)
NRBC: 0 % (ref 0–0)
PLATELETS: 180 10*3/uL (ref 145–400)
RBC: 3.52 10*6/uL — AB (ref 3.70–5.45)
RDW: 13.8 % (ref 11.2–14.5)
WBC: 3.1 10*3/uL — AB (ref 3.9–10.3)
lymph#: 0.8 10*3/uL — ABNORMAL LOW (ref 0.9–3.3)

## 2015-04-17 MED ORDER — DIPHENHYDRAMINE HCL 50 MG/ML IJ SOLN
25.0000 mg | Freq: Once | INTRAMUSCULAR | Status: AC
  Administered 2015-04-17: 25 mg via INTRAVENOUS

## 2015-04-17 MED ORDER — CARBOPLATIN CHEMO INJECTION 600 MG/60ML
733.5000 mg | Freq: Once | INTRAVENOUS | Status: AC
  Administered 2015-04-17: 730 mg via INTRAVENOUS
  Filled 2015-04-17: qty 73

## 2015-04-17 MED ORDER — DIPHENHYDRAMINE HCL 50 MG/ML IJ SOLN
INTRAMUSCULAR | Status: AC
  Filled 2015-04-17: qty 1

## 2015-04-17 MED ORDER — SODIUM CHLORIDE 0.9 % IV SOLN
Freq: Once | INTRAVENOUS | Status: AC
  Administered 2015-04-17: 13:00:00 via INTRAVENOUS
  Filled 2015-04-17: qty 8

## 2015-04-17 MED ORDER — PACLITAXEL CHEMO INJECTION 300 MG/50ML
80.0000 mg/m2 | Freq: Once | INTRAVENOUS | Status: AC
  Administered 2015-04-17: 192 mg via INTRAVENOUS
  Filled 2015-04-17: qty 32

## 2015-04-17 MED ORDER — FAMOTIDINE IN NACL 20-0.9 MG/50ML-% IV SOLN
INTRAVENOUS | Status: AC
  Filled 2015-04-17: qty 50

## 2015-04-17 MED ORDER — FAMOTIDINE IN NACL 20-0.9 MG/50ML-% IV SOLN
20.0000 mg | Freq: Once | INTRAVENOUS | Status: AC
  Administered 2015-04-17: 20 mg via INTRAVENOUS

## 2015-04-17 MED ORDER — SODIUM CHLORIDE 0.9 % IV SOLN
Freq: Once | INTRAVENOUS | Status: AC
Start: 2015-04-17 — End: 2015-04-17
  Administered 2015-04-17: 12:00:00 via INTRAVENOUS

## 2015-04-17 NOTE — Patient Instructions (Signed)
[facility] Discharge Instructions for Patients Receiving Chemotherapy  Today you received the following chemotherapy agents Taxol/Carboplatin.  To help prevent nausea and vomiting after your treatment, we encourage you to take your nausea medication as prescribed.   If you develop nausea and vomiting that is not controlled by your nausea medication, call the clinic.   BELOW ARE SYMPTOMS THAT SHOULD BE REPORTED IMMEDIATELY:  *FEVER GREATER THAN 100.5 F  *CHILLS WITH OR WITHOUT FEVER  NAUSEA AND VOMITING THAT IS NOT CONTROLLED WITH YOUR NAUSEA MEDICATION  *UNUSUAL SHORTNESS OF BREATH  *UNUSUAL BRUISING OR BLEEDING  TENDERNESS IN MOUTH AND THROAT WITH OR WITHOUT PRESENCE OF ULCERS  *URINARY PROBLEMS  *BOWEL PROBLEMS  UNUSUAL RASH Items with * indicate a potential emergency and should be followed up as soon as possible.  Feel free to call the clinic you have any questions or concerns. The clinic phone number is [phone].  Please show the [place] at check-in to the Emergency Department and triage nurse.  Take Ativan and not Xanax. If you need to take the Xanax make sure it is at least 4 hours apart from taking the Ativan. Be sure to take Zofran in the morning.

## 2015-04-17 NOTE — Progress Notes (Signed)
1335 Patient complains of stinging at IV site. IV discontinued. No swelling, redness, or discomfort noted at the IV site.  New IV restarted at 1345 right forearm using a 24 gauge IV needle. Patient tolerated well. Taxol Restarted.

## 2015-04-17 NOTE — Telephone Encounter (Signed)
Pt needed to be stuck 4-5 times for iv access for chemo. RN discussed PAC with pt and she is agreeable. S/w Dr Jana Hakim and order given for [facility] placement.

## 2015-04-17 NOTE — Progress Notes (Signed)
Pt states that pt does not want to take zofran due to increased constipation. She reports taking ativan at home at 9am today to help with nausea and did not take any premed steroids at home either. Pt is currently afebrile 98.8 today and reports feeling much improved. Minor redness observed on R side of face, that could be related to pt recent fever. Pt also reports very small amount of diarrhea this morning prior to coming to treatment. Notified Dr. Marko Plume and states that pt needs her zofran with the decadron for premeds today, and to obtain another STAT CBC Diff, and Cdiff sample if she can. Gave pt education on importance of premeds for taxol and carboplatin today. Pt understands and knows plan of care. Pt also reports shaving her head yesterday and had multiple skin rashes and minor folliculits. She continues to cleanse with baby shampoo and warm water, and applies neosporin as needed. Denies any itching or pain at this time.

## 2015-04-18 ENCOUNTER — Telehealth: Payer: Self-pay | Admitting: *Deleted

## 2015-04-18 ENCOUNTER — Ambulatory Visit (HOSPITAL_BASED_OUTPATIENT_CLINIC_OR_DEPARTMENT_OTHER): Payer: Medicare HMO

## 2015-04-18 ENCOUNTER — Other Ambulatory Visit (HOSPITAL_COMMUNITY)
Admission: RE | Admit: 2015-04-18 | Discharge: 2015-04-18 | Disposition: A | Discharge status: Home / Self Care | Payer: Medicare HMO | Source: Ambulatory Visit | Attending: Oncology | Admitting: Oncology

## 2015-04-18 VITALS — BP 128/74 | HR 75 | Temp 98.5°F

## 2015-04-18 DIAGNOSIS — K5000 Crohn's disease of small intestine without complications: Secondary | ICD-10-CM | POA: Insufficient documentation

## 2015-04-18 DIAGNOSIS — Z5189 Encounter for other specified aftercare: Secondary | ICD-10-CM | POA: Diagnosis not present

## 2015-04-18 DIAGNOSIS — C562 Malignant neoplasm of left ovary: Secondary | ICD-10-CM

## 2015-04-18 DIAGNOSIS — C569 Malignant neoplasm of unspecified ovary: Secondary | ICD-10-CM

## 2015-04-18 MED ORDER — TBO-FILGRASTIM 480 MCG/0.8ML ~~LOC~~ SOSY
480.0000 ug | PREFILLED_SYRINGE | Freq: Once | SUBCUTANEOUS | Status: AC
  Administered 2015-04-18: 480 ug via SUBCUTANEOUS
  Filled 2015-04-18: qty 0.8

## 2015-04-18 NOTE — Progress Notes (Signed)
04/16/14- Pt unable to obtain stool sample today. Gave pt a cup to take home and collect stool if pt is still having diarrhea and send it in with her appt for 04/17/14. Pt and husband verbalized understanding. AVS printed

## 2015-04-18 NOTE — Progress Notes (Unsigned)
Received a call from lab tech about pt's C-diff sample. Informed this nurse that stool sample was cancelled  b/c stool was formed. I will make sure I call pt to let her know.

## 2015-04-18 NOTE — Telephone Encounter (Signed)
Called pt to inform her that the stool sample she provided could not be tested because it was a formed stool. Pt is not having any loose watery stools at the moment, but if she should I told her we will be glad to test another sample. Pt also mentioned that she would like the Benadryl in her pre-meds to be increased a little. She had requested that Dr. Marko Plume decrease the Benadryl and it was done for her last tx but she felt like it was decreased too much and was very anxious and nervous and couldn't relax therefore she is requesting that for her next tx the Benadryl be increased a little. I told her I would relate this message to the physician. Next treatment date is 1/12. Message to be fwd to Evlyn Clines, MD.

## 2015-04-21 ENCOUNTER — Other Ambulatory Visit: Payer: Self-pay | Admitting: Radiology

## 2015-04-22 ENCOUNTER — Other Ambulatory Visit (HOSPITAL_COMMUNITY): Payer: Medicare HMO

## 2015-04-22 ENCOUNTER — Ambulatory Visit (HOSPITAL_COMMUNITY): Payer: Medicare HMO

## 2015-04-23 ENCOUNTER — Other Ambulatory Visit: Payer: Self-pay | Admitting: Radiology

## 2015-04-23 ENCOUNTER — Telehealth: Payer: Self-pay

## 2015-04-23 NOTE — Telephone Encounter (Addendum)
Running fever 100.5. It will break with ibuprofen. Denies respiratory issues.  Loose stools almost water, 2-3 x/day. Her stomach is rolling but she denies vomiting. Ate couple bowls of homemade potato soup yesterday. Drank about 32 oz sprite yesterday. Is feeling weak today, was going to go outside but was too weak. Offered a [facility] visit today and pt declined. She is cancelling her chemo appt for tomorrow.  She is scheduled for port on Friday, she is concerned her temperature may prevent her from getting a port. She got multiple sticks on her last chemo appt. She got bruises and rashes where she was stuck.I instructed her to take ibuprofen Thursday night to prevent fever. Instructed if stool is pure water to call us, if it is just soft to use immodium. Encouraged water/sprite/[place] etc. Encouraged simple foods like soup, toast, banana. Encouraged using zofran or phenergan and immodium. She is scared of constipation d/t her history of IBS and chrohns.  S/w Selena Lesser NP and she OK'd this plan

## 2015-04-24 ENCOUNTER — Ambulatory Visit: Payer: Medicare HMO

## 2015-04-24 ENCOUNTER — Other Ambulatory Visit: Payer: Self-pay | Admitting: Radiology

## 2015-04-24 ENCOUNTER — Other Ambulatory Visit: Payer: Medicare HMO

## 2015-04-25 ENCOUNTER — Other Ambulatory Visit: Payer: Self-pay

## 2015-04-25 ENCOUNTER — Ambulatory Visit (HOSPITAL_BASED_OUTPATIENT_CLINIC_OR_DEPARTMENT_OTHER): Payer: Medicare HMO

## 2015-04-25 ENCOUNTER — Other Ambulatory Visit: Payer: Self-pay | Admitting: Oncology

## 2015-04-25 ENCOUNTER — Ambulatory Visit (HOSPITAL_BASED_OUTPATIENT_CLINIC_OR_DEPARTMENT_OTHER): Payer: Medicare HMO | Admitting: Nurse Practitioner

## 2015-04-25 ENCOUNTER — Ambulatory Visit: Payer: Medicare HMO

## 2015-04-25 ENCOUNTER — Encounter (HOSPITAL_COMMUNITY): Payer: Self-pay

## 2015-04-25 ENCOUNTER — Encounter: Payer: Self-pay | Admitting: Nurse Practitioner

## 2015-04-25 ENCOUNTER — Ambulatory Visit (HOSPITAL_COMMUNITY)
Admission: RE | Admit: 2015-04-25 | Discharge: 2015-04-25 | Disposition: A | Discharge status: Home / Self Care | Payer: Medicare HMO | Source: Ambulatory Visit | Attending: Oncology | Admitting: Oncology

## 2015-04-25 ENCOUNTER — Other Ambulatory Visit: Payer: Self-pay | Admitting: Nurse Practitioner

## 2015-04-25 ENCOUNTER — Telehealth: Payer: Self-pay | Admitting: Oncology

## 2015-04-25 VITALS — BP 144/67 | HR 71 | Temp 98.6°F | Resp 18

## 2015-04-25 DIAGNOSIS — G4700 Insomnia, unspecified: Secondary | ICD-10-CM | POA: Diagnosis not present

## 2015-04-25 DIAGNOSIS — K5090 Crohn's disease, unspecified, without complications: Secondary | ICD-10-CM | POA: Insufficient documentation

## 2015-04-25 DIAGNOSIS — C569 Malignant neoplasm of unspecified ovary: Secondary | ICD-10-CM | POA: Diagnosis not present

## 2015-04-25 DIAGNOSIS — M1990 Unspecified osteoarthritis, unspecified site: Secondary | ICD-10-CM | POA: Insufficient documentation

## 2015-04-25 DIAGNOSIS — L88 Pyoderma gangrenosum: Secondary | ICD-10-CM | POA: Diagnosis not present

## 2015-04-25 DIAGNOSIS — Z5189 Encounter for other specified aftercare: Secondary | ICD-10-CM

## 2015-04-25 DIAGNOSIS — Z7982 Long term (current) use of aspirin: Secondary | ICD-10-CM | POA: Diagnosis not present

## 2015-04-25 DIAGNOSIS — K219 Gastro-esophageal reflux disease without esophagitis: Secondary | ICD-10-CM | POA: Diagnosis not present

## 2015-04-25 DIAGNOSIS — C562 Malignant neoplasm of left ovary: Secondary | ICD-10-CM | POA: Insufficient documentation

## 2015-04-25 DIAGNOSIS — E669 Obesity, unspecified: Secondary | ICD-10-CM | POA: Diagnosis not present

## 2015-04-25 DIAGNOSIS — F419 Anxiety disorder, unspecified: Secondary | ICD-10-CM | POA: Insufficient documentation

## 2015-04-25 DIAGNOSIS — Z85820 Personal history of malignant melanoma of skin: Secondary | ICD-10-CM | POA: Diagnosis not present

## 2015-04-25 DIAGNOSIS — T451X5A Adverse effect of antineoplastic and immunosuppressive drugs, initial encounter: Secondary | ICD-10-CM | POA: Insufficient documentation

## 2015-04-25 DIAGNOSIS — Z5309 Procedure and treatment not carried out because of other contraindication: Secondary | ICD-10-CM | POA: Insufficient documentation

## 2015-04-25 DIAGNOSIS — R197 Diarrhea, unspecified: Secondary | ICD-10-CM | POA: Diagnosis not present

## 2015-04-25 DIAGNOSIS — Z885 Allergy status to narcotic agent status: Secondary | ICD-10-CM | POA: Insufficient documentation

## 2015-04-25 DIAGNOSIS — D61810 Antineoplastic chemotherapy induced pancytopenia: Secondary | ICD-10-CM

## 2015-04-25 DIAGNOSIS — R509 Fever, unspecified: Secondary | ICD-10-CM | POA: Diagnosis not present

## 2015-04-25 DIAGNOSIS — Z880 Allergy status to penicillin: Secondary | ICD-10-CM | POA: Insufficient documentation

## 2015-04-25 DIAGNOSIS — I10 Essential (primary) hypertension: Secondary | ICD-10-CM | POA: Diagnosis not present

## 2015-04-25 DIAGNOSIS — J45909 Unspecified asthma, uncomplicated: Secondary | ICD-10-CM | POA: Insufficient documentation

## 2015-04-25 DIAGNOSIS — E876 Hypokalemia: Secondary | ICD-10-CM | POA: Diagnosis not present

## 2015-04-25 DIAGNOSIS — R21 Rash and other nonspecific skin eruption: Secondary | ICD-10-CM

## 2015-04-25 DIAGNOSIS — D708 Other neutropenia: Secondary | ICD-10-CM

## 2015-04-25 LAB — CBC WITH DIFFERENTIAL/PLATELET
BASO%: 0.2 % (ref 0.0–2.0)
BASOS ABS: 0 10*3/uL (ref 0.0–0.1)
BASOS ABS: 0 10*3/uL (ref 0.0–0.1)
Basophils Relative: 0 %
EOS ABS: 0 10*3/uL (ref 0.0–0.7)
EOS%: 0.4 % (ref 0.0–7.0)
Eosinophils Absolute: 0 10*3/uL (ref 0.0–0.5)
Eosinophils Relative: 1 %
HCT: 24.9 % — ABNORMAL LOW (ref 34.8–46.6)
HEMATOCRIT: 25.2 % — AB (ref 36.0–46.0)
HEMOGLOBIN: 8 g/dL — AB (ref 11.6–15.9)
Hemoglobin: 8 g/dL — ABNORMAL LOW (ref 12.0–15.0)
LYMPH%: 41.7 % (ref 14.0–49.7)
LYMPHS ABS: 0.6 10*3/uL — AB (ref 0.7–4.0)
Lymphocytes Relative: 41 %
MCH: 27.1 pg (ref 26.0–34.0)
MCH: 26.7 pg (ref 25.1–34.0)
MCHC: 31.7 g/dL (ref 30.0–36.0)
MCHC: 32.1 g/dL (ref 31.5–36.0)
MCV: 85.4 fL (ref 78.0–100.0)
MCV: 83.3 fL (ref 79.5–101.0)
MONO#: 0.2 10*3/uL (ref 0.1–0.9)
MONO%: 12.9 % (ref 0.0–14.0)
Monocytes Absolute: 0.2 10*3/uL (ref 0.1–1.0)
Monocytes Relative: 14 %
NEUT%: 44.8 % (ref 38.4–76.8)
NEUTROS ABS: 0.7 10*3/uL — AB (ref 1.7–7.7)
NEUTROS ABS: 0.7 10*3/uL — AB (ref 1.5–6.5)
Neutrophils Relative %: 44 %
Platelets: 186 10*3/uL (ref 150–400)
Platelets: 185 10*3/uL (ref 145–400)
RBC: 2.95 MIL/uL — ABNORMAL LOW (ref 3.87–5.11)
RBC: 2.99 10*6/uL — ABNORMAL LOW (ref 3.70–5.45)
RDW: 13.7 % (ref 11.5–15.5)
RDW: 13.7 % (ref 11.2–14.5)
WBC: 1.5 10*3/uL — ABNORMAL LOW (ref 4.0–10.5)
WBC: 1.5 10*3/uL — AB (ref 3.9–10.3)
lymph#: 0.6 10*3/uL — ABNORMAL LOW (ref 0.9–3.3)

## 2015-04-25 LAB — COMPREHENSIVE METABOLIC PANEL
ALBUMIN: 3.4 g/dL — AB (ref 3.5–5.0)
ALK PHOS: 74 U/L (ref 40–150)
ALT: 25 U/L (ref 0–55)
AST: 23 U/L (ref 5–34)
Anion Gap: 10 mEq/L (ref 3–11)
BUN: 24.8 mg/dL (ref 7.0–26.0)
CO2: 28 meq/L (ref 22–29)
Calcium: 8.6 mg/dL (ref 8.4–10.4)
Chloride: 101 mEq/L (ref 98–109)
Creatinine: 1.2 mg/dL — ABNORMAL HIGH (ref 0.6–1.1)
EGFR: 51 mL/min/{1.73_m2} — ABNORMAL LOW (ref >90)
GLUCOSE: 85 mg/dL (ref 70–140)
POTASSIUM: 3.1 meq/L — AB (ref 3.5–5.1)
SODIUM: 139 meq/L (ref 136–145)
Total Bilirubin: 0.39 mg/dL (ref 0.20–1.20)
Total Protein: 7.5 g/dL (ref 6.4–8.3)

## 2015-04-25 LAB — URINALYSIS, MICROSCOPIC - CHCC
Bilirubin (Urine): NEGATIVE
Blood: NEGATIVE
GLUCOSE UR CHCC: NEGATIVE mg/dL
Ketones: NEGATIVE mg/dL
LEUKOCYTE ESTERASE: NEGATIVE
Nitrite: NEGATIVE
PROTEIN: NEGATIVE mg/dL
RBC / HPF: NEGATIVE (ref 0–2)
Specific Gravity, Urine: 1.015 (ref 1.003–1.035)
UROBILINOGEN UR: 0.2 mg/dL (ref 0.2–1)
pH: 6 (ref 4.6–8.0)

## 2015-04-25 LAB — PROTIME-INR
INR: 1.01 (ref 0.00–1.49)
Prothrombin Time: 13.5 seconds (ref 11.6–15.2)

## 2015-04-25 MED ORDER — LIDOCAINE-PRILOCAINE 2.5-2.5 % EX CREA
TOPICAL_CREAM | CUTANEOUS | Status: DC
Start: 2015-04-25 — End: 2018-06-05

## 2015-04-25 MED ORDER — CEPHALEXIN 500 MG PO CAPS: 500.0000 mg | ORAL_CAPSULE | Freq: Four times a day (QID) | ORAL | Status: DC

## 2015-04-25 MED ORDER — DIPHENHYDRAMINE HCL 50 MG PO TABS
25.0000 mg | ORAL_TABLET | Freq: Every evening | ORAL | Status: DC | PRN
Start: 2015-04-25 — End: 2015-05-02

## 2015-04-25 MED ORDER — TBO-FILGRASTIM 480 MCG/0.8ML ~~LOC~~ SOSY
480.0000 ug | PREFILLED_SYRINGE | Freq: Once | SUBCUTANEOUS | Status: AC
  Administered 2015-04-25: 480 ug via SUBCUTANEOUS

## 2015-04-25 MED ORDER — VANCOMYCIN HCL 10 G IV SOLR
1500.0000 mg | INTRAVENOUS | Status: DC
  Filled 2015-04-25: qty 1500

## 2015-04-25 MED ORDER — FAMOTIDINE 20 MG PO TABS: 20.0000 mg | ORAL_TABLET | Freq: Two times a day (BID) | ORAL | Status: DC

## 2015-04-25 MED ORDER — SODIUM CHLORIDE 0.9 % IV SOLN
INTRAVENOUS | Status: DC
  Administered 2015-04-25: 14:00:00 via INTRAVENOUS

## 2015-04-25 MED ORDER — POTASSIUM CHLORIDE CRYS ER 20 MEQ PO TBCR: 20.0000 meq | EXTENDED_RELEASE_TABLET | Freq: Two times a day (BID) | ORAL | Status: DC

## 2015-04-25 NOTE — Telephone Encounter (Signed)
1/18 lab added per pof and patient will get appointment/avs with cindy bacon

## 2015-04-25 NOTE — H&P (Signed)
Chief Complaint: Patient was seen in consultation today for Port-A-Cath placement  Referring Physician(s): Magrinat,Gustav C  History of Present Illness: [name] is a 55 y.o. 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 Additional past medical history as below.  Past Medical History  Diagnosis Date  . Pyoderma gangrenosum   . Anxiety   . Arthritis   . Hypertension   . GERD (gastroesophageal reflux disease)   . Crohn disease ([place])   . Insomnia   . Asthma   . Melanoma ([place])     rt. dorsal leg    Past Surgical History  Procedure Laterality Date  . Melanoma excision  06/2001    right leg; also removed 2 lymph nodes  . Dilatation & curettage/hysteroscopy with trueclear N/A 08/17/2013    Procedure: DILATATION & CURETTAGE/HYSTEROSCOPY WITH TRUCLEAR;  Surgeon: Luz Lex, MD;  Location: [facility];  Service: Gynecology;  Laterality: N/A;  . Dilation and curettage of uterus    . Abdominal hysterectomy Bilateral 02/11/2015    Procedure: HYSTERECTOMY ABDOMINAL, bilateral salpingo-oophorectomy;  Surgeon: Louretta Shorten, MD;  Location: [facility];  Service: Gynecology;  Laterality: Bilateral;  . Omentectomy N/A 02/11/2015    Procedure: OMENTECTOMY;  Surgeon: Louretta Shorten, MD;  Location: [facility];  Service: Gynecology;  Laterality: N/A;    Allergies: Amoxicillin; Butorphanol tartrate; Cephalosporins; Ciprofloxacin hcl; Clindamycin/lincomycin; Codeine; Effexor; Levofloxacin; Losartan; Moxifloxacin; and Paxil  Medications: Prior to Admission medications   Medication Sig Start Date End Date Taking? Authorizing Provider  albuterol (PROVENTIL HFA;VENTOLIN HFA) 108 (90 BASE) MCG/ACT inhaler Inhale 2 puffs into the lungs every 4 (four) hours as needed for wheezing or shortness of breath.    Historical Provider, MD  alprazolam Duanne Moron) 2 MG tablet Take 2 mg  by mouth 2 (two) times daily. 1/2 tab AM and 1 tab PM    Historical Provider, MD  amLODipine (NORVASC) 2.5 MG tablet Take 2.5 mg by mouth daily.    Historical Provider, MD  Aspirin-Acetaminophen-Caffeine (PAMPRIN MAX PO) Take 2 tablets by mouth daily as needed (pain).     Historical Provider, MD  budesonide-formoterol (SYMBICORT) 160-4.5 MCG/ACT inhaler Inhale 2 puffs into the lungs 2 (two) times daily.    Historical Provider, MD  Cholecalciferol (VITAMIN D3) 5000 UNITS TABS Take 1 tablet by mouth daily.    Historical Provider, MD  dexamethasone (DECADRON) 4 MG tablet Take 5 tablets with food 12 hours prior to Taxol chemotherapy 04/10/15   Lennis Marion Downer, MD  Ferrous Fumarate (HEMOCYTE) 324 (106 Fe) MG TABS Take 1 tablet (106 mg of iron total) by mouth daily. 04/16/15   Lennis Marion Downer, MD  fluconazole (DIFLUCAN) 150 MG tablet Take 150 mg by mouth as needed.    Historical Provider, MD  glucosamine-chondroitin 500-400 MG tablet Take 2 tablets by mouth 2 (two) times daily.     Historical Provider, MD  hydroxypropyl methylcellulose / hypromellose (ISOPTO TEARS / GONIOVISC) 2.5 % ophthalmic solution Place 2 drops into both eyes as needed for dry eyes.    Historical Provider, MD  ibuprofen (ADVIL,MOTRIN) 600 MG tablet Take 1 tablet (600 mg total) by mouth daily as needed (mild pain). 04/03/15   Lennis Marion Downer, MD  ketoconazole (NIZORAL) 2 % cream Apply 1 application topically daily as needed for irritation.    Historical Provider, MD  Lactobacillus (ACIDOPHILUS PO) Take 2 capsules by mouth daily.  Historical Provider, MD  levocetirizine (XYZAL) 5 MG tablet Take 5 mg by mouth every morning.     Historical Provider, MD  LORazepam (ATIVAN) 0.5 MG tablet One SL or po every 6 hours as needed for nausea. Will make drowsy. Do not use xanax within 4 hours of lorazepam 03/24/15   Lennis P Livesay, MD  montelukast (SINGULAIR) 10 MG tablet Take 10 mg by mouth at bedtime.    Historical Provider, MD  nystatin  (MYCOSTATIN/NYSTOP) 100000 UNIT/GM POWD Apply powder to groin twice a day. 04/10/15   Lennis Marion Downer, MD  Omega-3 Fatty Acids (FISH OIL) 1000 MG CAPS Take 2 capsules by mouth daily.    Historical Provider, MD  ondansetron (ZOFRAN) 8 MG tablet Take 1 tablet (8 mg total) by mouth every 8 (eight) hours as needed for nausea or vomiting. Will not make drowsy 03/24/15   Lennis Marion Downer, MD  orphenadrine (NORFLEX) 100 MG tablet Take 100 mg by mouth 2 (two) times daily as needed for mild pain.    Historical Provider, MD  promethazine (PHENERGAN) 25 MG tablet Take 25 mg by mouth every 6 (six) hours as needed for nausea.     Historical Provider, MD  ranitidine (ZANTAC) 150 MG tablet Take 150 mg by mouth 2 (two) times daily.  05/14/11   Historical Provider, MD  sertraline (ZOLOFT) 50 MG tablet Take 50 mg by mouth daily.    Historical Provider, MD  triamcinolone cream (KENALOG) 0.1 % Apply 1 application topically 2 (two) times daily as needed.     Historical Provider, MD  triamterene-hydrochlorothiazide (MAXZIDE) 75-50 MG per tablet Take 1 tablet by mouth daily.    Historical Provider, MD  vitamin E 400 UNIT capsule Take 400 Units by mouth daily.    Historical Provider, MD     Family History  Problem Relation Age of Onset  . Colon cancer Neg Hx   . Colon polyps Father   . Diabetes Father   . Heart disease Paternal Uncle     Social History   Social History  . Marital Status: Married    Spouse Name: N/A  . Number of Children: 0  . Years of Education: N/A   Occupational History  . disabled    Social History Main Topics  . Smoking status: Never Smoker   . Smokeless tobacco: Never Used  . Alcohol Use: No  . Drug Use: No  . Sexual Activity: Not on file   Other Topics Concern  . Not on file   Social History Narrative      Review of Systems  Constitutional: Positive for fever and fatigue. Negative for chills.  Respiratory: Negative for cough and shortness of breath.   Cardiovascular:  Negative for chest pain.  Gastrointestinal: Positive for diarrhea. Negative for nausea, vomiting, abdominal pain and blood in stool.  Genitourinary: Negative for dysuria and hematuria.  Musculoskeletal: Negative for back pain.  Neurological: Positive for weakness. Negative for headaches.    Vital Signs: BP 145/74 mmHg  Pulse 95  Temp(Src) 98.5 F (36.9 C) (Oral)  Resp 16  SpO2 97%  Physical Exam  Constitutional: She is oriented to person, place, and time. She appears well-developed and well-nourished.  Cardiovascular: Normal rate and regular rhythm.   Pulmonary/Chest: Effort normal and breath sounds normal.  Abdominal: Soft. Bowel sounds are normal. There is no tenderness.  obese  Musculoskeletal: Normal range of motion.  Neurological: She is alert and oriented to person, place, and time.  Skin:  Two erythematous  patches noted on patient's left upper extremity ,one in the medial aspect which is warm to touch and tender    Mallampati Score:     Imaging: No results found.  Labs:  CBC:  Recent Labs  04/03/15 0952 04/10/15 1042 04/15/15 1305 04/17/15 1142  WBC 1.5* 4.6 2.9* 3.1*  HGB 10.3* 10.8* 9.7* 9.7*  HCT 32.1* 33.1* 29.8* 30.1*  PLT 158 104* 164 180    COAGS: No results for input(s): INR, APTT in the last 8760 hours.  BMP:  Recent Labs  02/06/15 1450  03/26/15 1244 03/31/15 1122 04/10/15 1042 04/15/15 1305  NA 137  < > 136 138 137 138  K 3.4*  < > 3.6 3.6 3.7 3.8  CL 100*  --   --   --   --   --   CO2 29  < > 22 28 25  31*  GLUCOSE 82  < > 239* 95 105 92  BUN 25*  < > 19.9 22.6 22.1 23.8  CALCIUM 8.9  < > 9.7 8.6 9.1 9.1  CREATININE 1.12*  < > 1.1 0.9 1.1 0.9  GFRNONAA 55*  --   --   --   --   --   GFRAA >60  --   --   --   --   --   < > = values in this interval not displayed.  LIVER FUNCTION TESTS:  Recent Labs  03/26/15 1244 03/31/15 1122 04/10/15 1042 04/15/15 1305  BILITOT 0.33 0.67 0.35 0.39  AST 18 30 18 19   ALT 18 29 24 23     ALKPHOS 92 64 82 84  PROT 9.0* 7.4 8.1 7.4  ALBUMIN 3.4* 3.1* 3.5 3.3*    TUMOR MARKERS: No results for input(s): AFPTM, CEA, CA199, CHROMGRNA in the last 8760 hours.  Assessment and Plan: 55 y.o. 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 Patient does report recent temperature elevations at home, most recently 100.5 last night. She is currently afebrile. Labs are pending at this time.Risks and benefits discussed with the patient /husband including, but not limited to bleeding, infection, pneumothorax, or fibrin sheath development and need for additional procedures.All of the patient's questions were answered, patient is agreeable to proceed.Consent signed and in chart.  F/u labs today reveal WBC of 1.5, hgb 8.0 ; case d/w Drs. Shick/Magrinat; decision made to postpone port placement until 1/18. Will recheck CBC on 1/18 preprocedure.    Thank you for this interesting consult.  I greatly enjoyed meeting [name] and look forward to participating in their care.  A copy of this report was sent to the requesting provider on this date.  Electronically Signed: D. Rowe Robert 04/25/2015, 1:20 PM    I spent a total of 15 minutes in face to face in clinical consultation, greater than 50% of which was counseling/coordinating care for Port-A-Cath vs PICC placement

## 2015-04-25 NOTE — Patient Instructions (Signed)
Stop Zantac while using Pepcid for rash.  Get Pepcid 56m and Benadryl 268mover the counter from your pharmacy.

## 2015-04-25 NOTE — Assessment & Plan Note (Addendum)
Patient received cycle 2, day 1 of her carboplatin/paclitaxel chemotherapy on 04/17/2015.  Blood counts obtained today revealed a WBC of 1.5, ANC 0.7, hemoglobin 8.0, and platelet count 185.  Patient reports stable fatigue; he denies any shortness of breath whatsoever with exertion. She denies any UTI symptoms or URI symptoms whatsoever.  She does report an intermittent low-grade fever to maximum of 99.5 within the past 24 hours.  Patient was afebrile today while at the cancer center.  Patient was scheduled to have a Port-A-Cath placed today; but it was noted the patient was neutropenic.  Interventional radiologist, physician assistant, Lennette Bihari called this provider to alert of failed neutropenia; a plan to postpone the Port-A-Cath placement until next week on Wednesday, 04/30/2015.  After consulting with Dr. Burr Medico on call physician-patient was given Granix 480 g for growth factor support.  Also, urinalysis was essentially within normal limits.  Pending results with both urine culture and blood culture.  Patient was advised she may take Tylenol for low-grade fever.  Also reviewed all neutropenia guidelines with the patient and her husband.  Patient was advised to go directly to the emergency department over the weekend for any worsening symptoms whatsoever.

## 2015-04-25 NOTE — Progress Notes (Unsigned)
Was called by radiology who had been scheduled to place a port in [name]. The counts are low and the patient has a bit of a rash possibly consistent with cellulitis. She is afebrile. Nevertheless I thought postponing the procedure was the best option. She will have a CBC on January 18 and they will place the port at that time assuming her counts have sufficiently recovered

## 2015-04-25 NOTE — Assessment & Plan Note (Signed)
Potassium down to 3.1.  Patient was given potassium 20 mEq tablets to take twice daily for a total of 5 days.  We'll continue to monitor potassium closely.  Patient was also encouraged to push potassium in her diet is much as possible.

## 2015-04-25 NOTE — Assessment & Plan Note (Addendum)
Patient has been experiencing up to maximum of 3 episodes of diarrhea per day since receiving her last cycle of chemotherapy.  She states that she can take Imodium; and her diarrhea resolves.  Patient reports she has a history of chronic irritable bowel syndrome.

## 2015-04-25 NOTE — Progress Notes (Signed)
SYMPTOM MANAGEMENT CLINIC   HPI: [name] 55 y.o. female diagnosed with ovarian cancer.  Currently undergoing carboplatin/paclitaxel chemotherapy regimen.  Patient was scheduled to receive a Port-A-Cath placement today; but it was noted per interventional radiology.  The patient was neutropenic.  Patient also reports intermittent low-grade fevers to maximum 99.5 within the last 24 hours.  She states that she is a very hard IV stick; and received approximate 6 different IV sticks prior to receiving her last peripheral chemotherapy.  She has noted a new onset rash to her bilateral forearms from previous tape/Coban was.  She's also noticed a very red, warm.  Rash to her left upper arm as well.  She reports some occasional chronic diarrhea that is resolved with Imodium.   HPI  ROS  Past Medical History  Diagnosis Date  . Pyoderma gangrenosum   . Anxiety   . Arthritis   . Hypertension   . GERD (gastroesophageal reflux disease)   . Crohn disease ([place])   . Insomnia   . Asthma   . Melanoma ([place])     rt. dorsal leg    Past Surgical History  Procedure Laterality Date  . Melanoma excision  06/2001    right leg; also removed 2 lymph nodes  . Dilatation & curettage/hysteroscopy with trueclear N/A 08/17/2013    Procedure: DILATATION & CURETTAGE/HYSTEROSCOPY WITH TRUCLEAR;  Surgeon: Luz Lex, MD;  Location: [facility];  Service: Gynecology;  Laterality: N/A;  . Dilation and curettage of uterus    . Abdominal hysterectomy Bilateral 02/11/2015    Procedure: HYSTERECTOMY ABDOMINAL, bilateral salpingo-oophorectomy;  Surgeon: Louretta Shorten, MD;  Location: [facility];  Service: Gynecology;  Laterality: Bilateral;  . Omentectomy N/A 02/11/2015    Procedure: OMENTECTOMY;  Surgeon: Louretta Shorten, MD;  Location: [facility];  Service: Gynecology;  Laterality: N/A;    has CANDIDIASIS OF VULVA AND VAGINA; MORBID OBESITY; Anxiety state; HYPERTENSION; ASTHMA; GASTROESOPHAGEAL REFLUX DISEASE; CROHN'S DISEASE,  SMALL INTESTINE; RECTAL BLEEDING; CELLULITIS AND ABSCESS OF LEG EXCEPT FOOT; PYODERMA GANGRENOSUM; CONTACT DERMATITIS&OTHER ECZEMA DUE UNSPEC CAUSE; ARTHRITIS; EDEMA; OPEN WOUND OF KNEE LEG AND ANKLE COMPLICATED; MALIGNANT MELANOMA, HX OF; Ovarian ca ([place]); Morbid obesity with BMI of 50.0-59.9, adult ([place]); History of melanoma excision; Asthma exacerbation attacks; Crohn's disease without complication ([place]); Umbilical hernia without obstruction and without gangrene; Essential hypertension; Anemia due to antineoplastic chemotherapy; Folliculitis; Leukopenia due to antineoplastic chemotherapy; Hypokalemia; Antineoplastic chemotherapy induced pancytopenia ([place]); Fever; Rash; and Diarrhea on her problem list.    is allergic to amoxicillin; butorphanol tartrate; cephalosporins; ciprofloxacin hcl; clindamycin/lincomycin; codeine; effexor; levofloxacin; losartan; moxifloxacin; paxil; latex; and tape.    Medication List       This list is accurate as of: 04/25/15 11:01 PM.  Always use your most recent med list.               ACIDOPHILUS PO  Take 2 capsules by mouth daily.     albuterol 108 (90 Base) MCG/ACT inhaler  Commonly known as:  PROVENTIL HFA;VENTOLIN HFA  Inhale 2 puffs into the lungs every 4 (four) hours as needed for wheezing or shortness of breath.     alprazolam 2 MG tablet  Commonly known as:  XANAX  Take 2 mg by mouth 2 (two) times daily. 1/2 tab AM and 1 tab PM     amLODipine 2.5 MG tablet  Commonly known as:  NORVASC  Take 2.5 mg by mouth daily.     budesonide-formoterol 160-4.5 MCG/ACT inhaler  Commonly known as:  SYMBICORT  Inhale 2 puffs into the lungs 2 (two) times daily.     cephALEXin 500 MG capsule  Commonly known as:  KEFLEX  Take 1 capsule (500 mg total) by mouth 4 (four) times daily.     dexamethasone 4 MG tablet  Commonly known as:  DECADRON  Take 5 tablets with food 12 hours prior to Taxol chemotherapy     diphenhydrAMINE 50 MG tablet  Commonly known as:   BENADRYL  Take 0.5 tablets (25 mg total) by mouth at bedtime as needed for itching.     famotidine 20 MG tablet  Commonly known as:  PEPCID  Take 1 tablet (20 mg total) by mouth 2 (two) times daily.     Ferrous Fumarate 324 (106 Fe) MG Tabs  Commonly known as:  HEMOCYTE  Take 1 tablet (106 mg of iron total) by mouth daily.     Fish Oil 1000 MG Caps  Take 2 capsules by mouth daily.     fluconazole 150 MG tablet  Commonly known as:  DIFLUCAN  Take 150 mg by mouth as needed.     glucosamine-chondroitin 500-400 MG tablet  Take 2 tablets by mouth 2 (two) times daily.     hydroxypropyl methylcellulose / hypromellose 2.5 % ophthalmic solution  Commonly known as:  ISOPTO TEARS / GONIOVISC  Place 2 drops into both eyes as needed for dry eyes.     ibuprofen 600 MG tablet  Commonly known as:  ADVIL,MOTRIN  Take 1 tablet (600 mg total) by mouth daily as needed (mild pain).     ketoconazole 2 % cream  Commonly known as:  NIZORAL  Apply 1 application topically daily as needed for irritation.     levocetirizine 5 MG tablet  Commonly known as:  XYZAL  Take 5 mg by mouth every morning.     lidocaine-prilocaine cream  Commonly known as:  EMLA  Apply to Porta-cath site 1-2 hours prior to access as directed.     LORazepam 0.5 MG tablet  Commonly known as:  ATIVAN  One SL or po every 6 hours as needed for nausea. Will make drowsy. Do not use xanax within 4 hours of lorazepam     montelukast 10 MG tablet  Commonly known as:  SINGULAIR  Take 10 mg by mouth at bedtime.     nystatin 100000 UNIT/GM Powd  Apply powder to groin twice a day.     ondansetron 8 MG tablet  Commonly known as:  ZOFRAN  Take 1 tablet (8 mg total) by mouth every 8 (eight) hours as needed for nausea or vomiting. Will not make drowsy     orphenadrine 100 MG tablet  Commonly known as:  NORFLEX  Take 100 mg by mouth 2 (two) times daily as needed for mild pain.     PAMPRIN MAX PO  Take 2 tablets by mouth daily as  needed (pain).     potassium chloride SA 20 MEQ tablet  Commonly known as:  K-DUR,KLOR-CON  Take 1 tablet (20 mEq total) by mouth 2 (two) times daily.     promethazine 25 MG tablet  Commonly known as:  PHENERGAN  Take 25 mg by mouth every 6 (six) hours as needed for nausea.     ranitidine 150 MG tablet  Commonly known as:  ZANTAC  Take 150 mg by mouth 2 (two) times daily.     sertraline 50 MG tablet  Commonly known as:  ZOLOFT  Take 50 mg by mouth daily.  triamcinolone cream 0.1 %  Commonly known as:  KENALOG  Apply 1 application topically 2 (two) times daily as needed.     triamterene-hydrochlorothiazide 75-50 MG tablet  Commonly known as:  MAXZIDE  Take 1 tablet by mouth daily.     Vitamin D3 5000 units Tabs  Take 1 tablet by mouth daily.     vitamin E 400 UNIT capsule  Take 400 Units by mouth daily.         PHYSICAL EXAMINATION  Oncology Vitals 04/25/2015 04/25/2015  Height - -  Weight - -  Weight (lbs) - -  BMI (kg/m2) - -  Temp 98.6 98.5  Pulse 71 95  Resp 18 16  SpO2 96 97  BSA (m2) - -   BP Readings from Last 2 Encounters:  04/25/15 144/67  04/25/15 145/74    Physical Exam  Constitutional: She is oriented to person, place, and time and well-developed, well-nourished, and in no distress.  HENT:  Head: Normocephalic and atraumatic.  Mouth/Throat: Oropharynx is clear and moist.  Eyes: Conjunctivae and EOM are normal. Pupils are equal, round, and reactive to light. Right eye exhibits no discharge. Left eye exhibits no discharge. No scleral icterus.  Neck: Normal range of motion.  Pulmonary/Chest: Effort normal. No respiratory distress.  Musculoskeletal: Normal range of motion. She exhibits no edema or tenderness.  Neurological: She is alert and oriented to person, place, and time. Gait normal.  Skin: Skin is warm and dry. Rash noted. There is erythema. No pallor.  Patient has a scattered, pink/red rash that is nontender to her bilateral forearms;  and also has a circular area of erythema and warmth directly above her left elbow region.  There is no edema, tenderness, or red streaks to the site.  Psychiatric: Affect normal.  Nursing note and vitals reviewed.   LABORATORY DATA:. Appointment on 04/25/2015  Component Date Value Ref Range Status  . Glucose 04/25/2015 Negative  Negative mg/dL Final  . Bilirubin (Urine) 04/25/2015 Negative  Negative Final  . Ketones 04/25/2015 Negative  Negative mg/dL Final  . Specific Gravity, Urine 04/25/2015 1.015  1.003 - 1.035 Final  . Blood 04/25/2015 Negative  Negative Final  . pH 04/25/2015 6.0  4.6 - 8.0 Final  . Protein 04/25/2015 Negative  Negative- <30 mg/dL Final  . Urobilinogen, UR 04/25/2015 0.2  0.2 - 1 mg/dL Final  . Nitrite 04/25/2015 Negative  Negative Final  . Leukocyte Esterase 04/25/2015 Negative  Negative Final  . RBC / HPF 04/25/2015 Negative  0 - 2 Final  . WBC, UA 04/25/2015 0-2  0 - 2 Final  . Bacteria, UA 04/25/2015 Few  Negative- Trace Final  . Epithelial Cells 04/25/2015 Few  Negative- Few Final  . WBC 04/25/2015 1.5* 3.9 - 10.3 10e3/uL Final  . NEUT# 04/25/2015 0.7* 1.5 - 6.5 10e3/uL Final  . HGB 04/25/2015 8.0* 11.6 - 15.9 g/dL Final  . HCT 04/25/2015 24.9* 34.8 - 46.6 % Final  . Platelets 04/25/2015 185  145 - 400 10e3/uL Final  . MCV 04/25/2015 83.3  79.5 - 101.0 fL Final  . MCH 04/25/2015 26.7  25.1 - 34.0 pg Final  . MCHC 04/25/2015 32.1  31.5 - 36.0 g/dL Final  . RBC 04/25/2015 2.99* 3.70 - 5.45 10e6/uL Final  . RDW 04/25/2015 13.7  11.2 - 14.5 % Final  . lymph# 04/25/2015 0.6* 0.9 - 3.3 10e3/uL Final  . MONO# 04/25/2015 0.2  0.1 - 0.9 10e3/uL Final  . Eosinophils Absolute 04/25/2015 0.0  0.0 -  0.5 10e3/uL Final  . Basophils Absolute 04/25/2015 0.0  0.0 - 0.1 10e3/uL Final  . NEUT% 04/25/2015 44.8  38.4 - 76.8 % Final  . LYMPH% 04/25/2015 41.7  14.0 - 49.7 % Final  . MONO% 04/25/2015 12.9  0.0 - 14.0 % Final  . EOS% 04/25/2015 0.4  0.0 - 7.0 % Final  .  BASO% 04/25/2015 0.2  0.0 - 2.0 % Final  . Sodium 04/25/2015 139  136 - 145 mEq/L Final  . Potassium 04/25/2015 3.1* 3.5 - 5.1 mEq/L Final  . Chloride 04/25/2015 101  98 - 109 mEq/L Final  . CO2 04/25/2015 28  22 - 29 mEq/L Final  . Glucose 04/25/2015 85  70 - 140 mg/dl Final   Glucose reference range is for nonfasting patients. Fasting glucose reference range is 70- 100.  Marland Kitchen BUN 04/25/2015 24.8  7.0 - 26.0 mg/dL Final  . Creatinine 04/25/2015 1.2* 0.6 - 1.1 mg/dL Final  . Total Bilirubin 04/25/2015 0.39  0.20 - 1.20 mg/dL Final  . Alkaline Phosphatase 04/25/2015 74  40 - 150 U/L Final  . AST 04/25/2015 23  5 - 34 U/L Final  . ALT 04/25/2015 25  0 - 55 U/L Final  . Total Protein 04/25/2015 7.5  6.4 - 8.3 g/dL Final  . Albumin 04/25/2015 3.4* 3.5 - 5.0 g/dL Final  . Calcium 04/25/2015 8.6  8.4 - 10.4 mg/dL Final  . Anion Gap 04/25/2015 10  3 - 11 mEq/L Final  . EGFR 04/25/2015 51* >90 ml/min/1.73 m2 Final   eGFR is calculated using the CKD-EPI Creatinine Equation (2009)  Hospital Outpatient Visit on 04/25/2015  Component Date Value Ref Range Status  . WBC 04/25/2015 1.5* 4.0 - 10.5 K/uL Final  . RBC 04/25/2015 2.95* 3.87 - 5.11 MIL/uL Final  . Hemoglobin 04/25/2015 8.0* 12.0 - 15.0 g/dL Final  . HCT 04/25/2015 25.2* 36.0 - 46.0 % Final  . MCV 04/25/2015 85.4  78.0 - 100.0 fL Final  . MCH 04/25/2015 27.1  26.0 - 34.0 pg Final  . MCHC 04/25/2015 31.7  30.0 - 36.0 g/dL Final  . RDW 04/25/2015 13.7  11.5 - 15.5 % Final  . Platelets 04/25/2015 186  150 - 400 K/uL Final  . Neutrophils Relative % 04/25/2015 44   Final  . Lymphocytes Relative 04/25/2015 41   Final  . Monocytes Relative 04/25/2015 14   Final  . Eosinophils Relative 04/25/2015 1   Final  . Basophils Relative 04/25/2015 0   Final  . Neutro Abs 04/25/2015 0.7* 1.7 - 7.7 K/uL Final  . Lymphs Abs 04/25/2015 0.6* 0.7 - 4.0 K/uL Final  . Monocytes Absolute 04/25/2015 0.2  0.1 - 1.0 K/uL Final  . Eosinophils Absolute 04/25/2015  0.0  0.0 - 0.7 K/uL Final  . Basophils Absolute 04/25/2015 0.0  0.0 - 0.1 K/uL Final  . WBC Morphology 04/25/2015 ATYPICAL LYMPHOCYTES   Final  . Prothrombin Time 04/25/2015 13.5  11.6 - 15.2 seconds Final  . INR 04/25/2015 1.01  0.00 - 1.49 Final   Left forearm rash:     Left elbow region:      RADIOGRAPHIC STUDIES: No results found.  ASSESSMENT/PLAN:    Antineoplastic chemotherapy induced pancytopenia ([place]) Patient received cycle 2, day 1 of her carboplatin/paclitaxel chemotherapy on 04/17/2015.  Blood counts obtained today revealed a WBC of 1.5, ANC 0.7, hemoglobin 8.0, and platelet count 185.  Patient reports stable fatigue; he denies any shortness of breath whatsoever with exertion. She denies any UTI symptoms or  URI symptoms whatsoever.  She does report an intermittent low-grade fever to maximum of 99.5 within the past 24 hours.  Patient was afebrile today while at the cancer center.  Patient was scheduled to have a Port-A-Cath placed today; but it was noted the patient was neutropenic.  Interventional radiologist, physician assistant, Lennette Bihari called this provider to alert of failed neutropenia; a plan to postpone the Port-A-Cath placement until next week on Wednesday, 04/30/2015.  After consulting with Dr. Burr Medico on call physician-patient was given Granix 480 g for growth factor support.  Also, urinalysis was essentially within normal limits.  Pending results with both urine culture and blood culture.  Patient was advised she may take Tylenol for low-grade fever.  Also reviewed all neutropenia guidelines with the patient and her husband.  Patient was advised to go directly to the emergency department over the weekend for any worsening symptoms whatsoever.  Diarrhea Patient has been experiencing up to maximum of 3 episodes of diarrhea per day since receiving her last cycle of chemotherapy.  She states that she can take Imodium; and her diarrhea resolves.  Patient reports  she has a history of chronic irritable bowel syndrome.  Fever Patient received cycle 2, day 1 of her carboplatin/paclitaxel chemotherapy on 04/17/2015.  Blood counts obtained today revealed a WBC of 1.5, ANC 0.7, hemoglobin 8.0, and platelet count 185.  Patient reports stable fatigue; he denies any shortness of breath whatsoever with exertion. She denies any UTI symptoms or URI symptoms whatsoever.  She does report an intermittent low-grade fever to maximum of 99.5 within the past 24 hours.  Patient was afebrile today while at the cancer center.  Patient was scheduled to have a Port-A-Cath placed today; but it was noted the patient was neutropenic.  Interventional radiologist, physician assistant, Lennette Bihari called this provider to alert of failed neutropenia; a plan to postpone the Port-A-Cath placement until next week on Wednesday, 04/30/2015.  After consulting with Dr. Burr Medico on call physician-patient was given Granix 480 g for growth factor support.  Also, urinalysis was essentially within normal limits.  Pending results with both urine culture and blood culture.  Patient was advised she may take Tylenol for low-grade fever.  Patient was advised to go directly to the emergency department over the weekend for any worsening symptoms whatsoever.  Hypokalemia Potassium down to 3.1.  Patient was given potassium 20 mEq tablets to take twice daily for a total of 5 days.  We'll continue to monitor potassium closely.  Patient was also encouraged to push potassium in her diet is much as possible.  Ovarian ca [facility]) Patient received cycle 2, day 1 of her carboplatin/paclitaxel chemotherapy regimen on 04/17/2015.  Patient was scheduled to receive cycle 2, day 8 of the same regimen.  Yesterday, 04/24/2015; the patient called and canceled her chemotherapy herself.  Blood counts obtained today did reveal pancytopenia with both neutropenia and anemia.  However, patient denies any issues with worsening  fatigue or shortness of breath with exertion.  Patient was typed and crossed today; with the plan of possibly receiving a blood transfusion when she returns on Monday morning, 04/28/2015.  Also, patient was advised to go directly to the emergency department over the weekend if she becomes more symptomatic with her anemia.  The plan is for the patient to return to the [facility] for labs and a symptom management clinic visit on 04/28/2015.  She is scheduled for labs and a Port-A-Cath placement on 04/30/2015.  She is scheduled to return for labs,  visit, and her next cycle of chemotherapy on 05/01/2015.  Rash Patient states that she has a history of chronic rash and cellulitis in the past.  Patient states she is also very hard IV stick.  She was scheduled for Port-A-Cath placement today; was found to be neutropenic and portacath placement was held.  Pt received Granix injection today for growth factor support.   Patient has a scattered red rash to her bilateral forearms where previous Coban wrap has been.  She also has an area to the left anterior elbow region that is red and slightly warm.  There is no edema, tenderness, or red streaks to the site.  Patient states that all of these areas to her bilateral arms are sites of previous IV sticks.  See attached photos.  Patient was advised to try Benadryl and Pepcid for any tape hypersensitivity reaction.  She may also try hydrocortisone cream to the rash.  Will place tape/adhesive to patient's allergy list.  Patient was advised to use.  Her tape in the future if at all possible.  May also need to use OpSite to future Port-A-Cath site as well.  Also, will prescribe Keflex for any possible cellulitis infection to the left arm.  Patient states that she has taken Keflex in the past with no difficulty.  Patient was advised to go directly to the emergency department over the weekend if she develops any worsening symptoms whatsoever.  Patient stated  understanding of all instructions; and was in agreement with this plan of care. The patient knows to call the clinic with any problems, questions or concerns.   Review/collaboration with Dr. Burr Medico (on call) regarding all aspects of patient's visit today.   Total time spent with patient was 40 minutes;  with greater than 75 percent of that time spent in face to face counseling regarding patient's symptoms,  and coordination of care and follow up.  Disclaimer:This dictation was prepared with Dragon/digital dictation along with [employer]. Any transcriptional errors that result from this process are unintentional.  Drue Second, NP 04/25/2015

## 2015-04-25 NOTE — Assessment & Plan Note (Signed)
Patient states that she has a history of chronic rash and cellulitis in the past.  Patient states she is also very hard IV stick.  She was scheduled for Port-A-Cath placement today; was found to be neutropenic and portacath placement was held.  Pt received Granix injection today for growth factor support.   Patient has a scattered red rash to her bilateral forearms where previous Coban wrap has been.  She also has an area to the left anterior elbow region that is red and slightly warm.  There is no edema, tenderness, or red streaks to the site.  Patient states that all of these areas to her bilateral arms are sites of previous IV sticks.  See attached photos.  Patient was advised to try Benadryl and Pepcid for any tape hypersensitivity reaction.  She may also try hydrocortisone cream to the rash.  Will place tape/adhesive to patient's allergy list.  Patient was advised to use.  Her tape in the future if at all possible.  May also need to use OpSite to future Port-A-Cath site as well.  Also, will prescribe Keflex for any possible cellulitis infection to the left arm.  Patient states that she has taken Keflex in the past with no difficulty.  Patient was advised to go directly to the emergency department over the weekend if she develops any worsening symptoms whatsoever.

## 2015-04-25 NOTE — Progress Notes (Signed)
A 2" X 2" round  reddened area that is warm to touch on inner of Left elbow area noted.  Rowe Robert PA in room and aware of this.  Pt is afebrile and states that it doesn't cause her any pain.

## 2015-04-25 NOTE — Discharge Instructions (Signed)
Implanted [facility] Guide An implanted port is a type of central line that is placed under the skin. Central lines are used to provide IV access when treatment or nutrition needs to be given through a person's veins. Implanted ports are used for long-term IV access. An implanted port may be placed because:   You need IV medicine that would be irritating to the small veins in your hands or arms.   You need long-term IV medicines, such as antibiotics.   You need IV nutrition for a long period.   You need frequent blood draws for lab tests.   You need dialysis.  Implanted ports are usually placed in the chest area, but they can also be placed in the upper arm, the abdomen, or the leg. An implanted port has two main parts:   Reservoir. The reservoir is round and will appear as a small, raised area under your skin. The reservoir is the part where a needle is inserted to give medicines or draw blood.   Catheter. The catheter is a thin, flexible tube that extends from the reservoir. The catheter is placed into a large vein. Medicine that is inserted into the reservoir goes into the catheter and then into the vein.  HOW WILL I CARE FOR MY INCISION SITE? Do not get the incision site wet. Bathe or shower as directed by your health care provider.  HOW IS MY PORT ACCESSED? Special steps must be taken to access the port:   Before the port is accessed, a numbing cream can be placed on the skin. This helps numb the skin over the port site.   Your health care provider uses a sterile technique to access the port.  Your health care provider must put on a mask and sterile gloves.  The skin over your port is cleaned carefully with an antiseptic and allowed to dry.  The port is gently pinched between sterile gloves, and a needle is inserted into the port.  Only "non-coring" port needles should be used to access the port. Once the port is accessed, a blood return should be checked. This helps  ensure that the port is in the vein and is not clogged.   If your port needs to remain accessed for a constant infusion, a clear (transparent) bandage will be placed over the needle site. The bandage and needle will need to be changed every week, or as directed by your health care provider.   Keep the bandage covering the needle clean and dry. Do not get it wet. Follow your health care provider's instructions on how to take a shower or bath while the port is accessed.   If your port does not need to stay accessed, no bandage is needed over the port.  WHAT IS FLUSHING? Flushing helps keep the port from getting clogged. Follow your health care provider's instructions on how and when to flush the port. Ports are usually flushed with saline solution or a medicine called heparin. The need for flushing will depend on how the port is used.   If the port is used for intermittent medicines or blood draws, the port will need to be flushed:   After medicines have been given.   After blood has been drawn.   As part of routine maintenance.   If a constant infusion is running, the port may not need to be flushed.  HOW LONG WILL MY PORT STAY IMPLANTED? The port can stay in for as long as your health care  provider thinks it is needed. When it is time for the port to come out, surgery will be done to remove it. The procedure is similar to the one performed when the port was put in.  WHEN SHOULD I SEEK IMMEDIATE MEDICAL CARE? When you have an implanted port, you should seek immediate medical care if:   You notice a bad smell coming from the incision site.   You have swelling, redness, or drainage at the incision site.   You have more swelling or pain at the port site or the surrounding area.   You have a fever that is not controlled with medicine.   This information is not intended to replace advice given to you by your health care provider. Make sure you discuss any questions you have with  your health care provider.   Document Released: 03/29/2005 Document Revised: 01/17/2013 Document Reviewed: 12/04/2012 Elsevier Interactive Patient Education 2016 [place] Insertion, Care After Refer to this sheet in the next few weeks. These instructions provide you with information on caring for yourself after your procedure. Your health care provider may also give you more specific instructions. Your treatment has been planned according to current medical practices, but problems sometimes occur. Call your health care provider if you have any problems or questions after your procedure. WHAT TO EXPECT AFTER THE PROCEDURE After your procedure, it is typical to have the following:   Discomfort at the port insertion site. Ice packs to the area will help.  Bruising on the skin over the port. This will subside in 3-4 days. HOME CARE INSTRUCTIONS  After your port is placed, you will get a manufacturer's information card. The card has information about your port. Keep this card with you at all times.   Know what kind of port you have. There are many types of ports available.   Wear a medical alert bracelet in case of an emergency. This can help alert health care workers that you have a port.   The port can stay in for as long as your health care provider believes it is necessary.   A home health care nurse may give medicines and take care of the port.   You or a family member can get special training and directions for giving medicine and taking care of the port at home.  SEEK MEDICAL CARE IF:   Your port does not flush or you are unable to get a blood return.   You have a fever or chills. SEEK IMMEDIATE MEDICAL CARE IF:  You have new fluid or pus coming from your incision.   You notice a bad smell coming from your incision site.   You have swelling, pain, or more redness at the incision or port site.   You have chest pain or shortness of breath.   This  information is not intended to replace advice given to you by your health care provider. Make sure you discuss any questions you have with your health care provider.   Document Released: 01/17/2013 Document Revised: 04/03/2013 Document Reviewed: 01/17/2013 Elsevier Interactive Patient Education 2016 [employer] Moderate Conscious Sedation, Adult Sedation is the use of medicines to promote relaxation and relieve discomfort and anxiety. Moderate conscious sedation is a type of sedation. Under moderate conscious sedation you are less alert than normal but are still able to respond to instructions or stimulation. Moderate conscious sedation is used during short medical and dental procedures. It is milder than deep sedation or general anesthesia and  allows you to return to your regular activities sooner. LET [facility] CARE PROVIDER KNOW ABOUT:   Any allergies you have.  All medicines you are taking, including vitamins, herbs, eye drops, creams, and over-the-counter medicines.  Use of steroids (by mouth or creams).  Previous problems you or members of your family have had with the use of anesthetics.  Any blood disorders you have.  Previous surgeries you have had.  Medical conditions you have.  Possibility of pregnancy, if this applies.  Use of cigarettes, alcohol, or illegal drugs. RISKS AND COMPLICATIONS Generally, this is a safe procedure. However, as with any procedure, problems can occur. Possible problems include:  Oversedation.  Trouble breathing on your own. You may need to have a breathing tube until you are awake and breathing on your own.  Allergic reaction to any of the medicines used for the procedure. BEFORE THE PROCEDURE  You may have blood tests done. These tests can help show how well your kidneys and liver are working. They can also show how well your blood clots.  A physical exam will be done.  Only take medicines as directed by your health care provider.  You may need to stop taking medicines (such as blood thinners, aspirin, or nonsteroidal anti-inflammatory drugs) before the procedure.   Do not eat or drink at least 6 hours before the procedure or as directed by your health care provider.  Arrange for a responsible adult, family member, or friend to take you home after the procedure. He or she should stay with you for at least 24 hours after the procedure, until the medicine has worn off. PROCEDURE   An intravenous (IV) catheter will be inserted into one of your veins. Medicine will be able to flow directly into your body through this catheter. You may be given medicine through this tube to help prevent pain and help you relax.  The medical or dental procedure will be done. AFTER THE PROCEDURE  You will stay in a recovery area until the medicine has worn off. Your blood pressure and pulse will be checked.   Depending on the procedure you had, you may be allowed to go home when you can tolerate liquids and your pain is under control.   This information is not intended to replace advice given to you by your health care provider. Make sure you discuss any questions you have with your health care provider.   Document Released: 12/22/2000 Document Revised: 04/19/2014 Document Reviewed: 12/04/2012 Elsevier Interactive Patient Education [employer].

## 2015-04-25 NOTE — Assessment & Plan Note (Signed)
Patient received cycle 2, day 1 of her carboplatin/paclitaxel chemotherapy regimen on 04/17/2015.  Patient was scheduled to receive cycle 2, day 8 of the same regimen.  Yesterday, 04/24/2015; the patient called and canceled her chemotherapy herself.  Blood counts obtained today did reveal pancytopenia with both neutropenia and anemia.  However, patient denies any issues with worsening fatigue or shortness of breath with exertion.  Patient was typed and crossed today; with the plan of possibly receiving a blood transfusion when she returns on Monday morning, 04/28/2015.  Also, patient was advised to go directly to the emergency department over the weekend if she becomes more symptomatic with her anemia.  The plan is for the patient to return to the [facility] for labs and a symptom management clinic visit on 04/28/2015.  She is scheduled for labs and a Port-A-Cath placement on 04/30/2015.  She is scheduled to return for labs, visit, and her next cycle of chemotherapy on 05/01/2015.

## 2015-04-25 NOTE — Assessment & Plan Note (Signed)
Patient received cycle 2, day 1 of her carboplatin/paclitaxel chemotherapy on 04/17/2015.  Blood counts obtained today revealed a WBC of 1.5, ANC 0.7, hemoglobin 8.0, and platelet count 185.  Patient reports stable fatigue; he denies any shortness of breath whatsoever with exertion. She denies any UTI symptoms or URI symptoms whatsoever.  She does report an intermittent low-grade fever to maximum of 99.5 within the past 24 hours.  Patient was afebrile today while at the cancer center.  Patient was scheduled to have a Port-A-Cath placed today; but it was noted the patient was neutropenic.  Interventional radiologist, physician assistant, Lennette Bihari called this provider to alert of failed neutropenia; a plan to postpone the Port-A-Cath placement until next week on Wednesday, 04/30/2015.  After consulting with Dr. Burr Medico on call physician-patient was given Granix 480 g for growth factor support.  Also, urinalysis was essentially within normal limits.  Pending results with both urine culture and blood culture.  Patient was advised she may take Tylenol for low-grade fever.  Patient was advised to go directly to the emergency department over the weekend for any worsening symptoms whatsoever.

## 2015-04-27 ENCOUNTER — Other Ambulatory Visit: Payer: Self-pay | Admitting: Oncology

## 2015-04-27 LAB — URINE CULTURE: Organism ID, Bacteria: NO GROWTH

## 2015-04-28 ENCOUNTER — Encounter: Payer: Medicare HMO | Admitting: Nurse Practitioner

## 2015-04-28 ENCOUNTER — Ambulatory Visit: Payer: Medicare HMO

## 2015-04-28 ENCOUNTER — Telehealth: Payer: Self-pay | Admitting: *Deleted

## 2015-04-28 LAB — PATHOLOGIST SMEAR REVIEW

## 2015-04-28 NOTE — Telephone Encounter (Signed)
TC to pt- LM for rtn call checking status of pt. She missed lab appt this morning at 930. Pt has [place] appt today to recheck potassium level and need for blood transfusion

## 2015-04-29 ENCOUNTER — Other Ambulatory Visit: Payer: Self-pay | Admitting: Radiology

## 2015-04-30 ENCOUNTER — Other Ambulatory Visit (HOSPITAL_COMMUNITY): Payer: Medicare HMO

## 2015-04-30 ENCOUNTER — Other Ambulatory Visit: Payer: Self-pay

## 2015-04-30 ENCOUNTER — Telehealth: Payer: Self-pay

## 2015-04-30 ENCOUNTER — Other Ambulatory Visit: Payer: Medicare HMO

## 2015-04-30 ENCOUNTER — Ambulatory Visit (HOSPITAL_COMMUNITY): Admission: RE | Admit: 2015-04-30 | Payer: Medicare HMO | Source: Ambulatory Visit

## 2015-04-30 DIAGNOSIS — C569 Malignant neoplasm of unspecified ovary: Secondary | ICD-10-CM

## 2015-04-30 NOTE — Telephone Encounter (Signed)
LM in [name]'s vm stating that Dr. Marko Plume and staff concerned that she has not kept appointments for follow up on 04-28-15 as her WBC was very low on 04-25-15. If she is admitted at a local hospital, could she or her husband call our office to let us know that she is being taken care of. She has an appointment with Dr. Marko Plume tomorrow at 23.  If she is not admitted to a hospital, it would be good to come and see Dr. Marko Plume to follow up low counts and talk about her care as this diagnosis and process is very overwhelming.

## 2015-05-01 ENCOUNTER — Other Ambulatory Visit (HOSPITAL_COMMUNITY)
Admission: RE | Admit: 2015-05-01 | Discharge: 2015-05-01 | Disposition: A | Discharge status: Home / Self Care | Payer: Medicare HMO | Source: Ambulatory Visit | Attending: Oncology | Admitting: Oncology

## 2015-05-01 ENCOUNTER — Ambulatory Visit (HOSPITAL_BASED_OUTPATIENT_CLINIC_OR_DEPARTMENT_OTHER): Payer: Medicare HMO

## 2015-05-01 ENCOUNTER — Ambulatory Visit (HOSPITAL_BASED_OUTPATIENT_CLINIC_OR_DEPARTMENT_OTHER): Payer: Medicare HMO | Admitting: Oncology

## 2015-05-01 ENCOUNTER — Ambulatory Visit (HOSPITAL_COMMUNITY)
Admission: RE | Admit: 2015-05-01 | Discharge: 2015-05-01 | Disposition: A | Discharge status: Home / Self Care | Payer: Medicare HMO | Source: Ambulatory Visit | Attending: Oncology | Admitting: Oncology

## 2015-05-01 ENCOUNTER — Telehealth: Payer: Self-pay | Admitting: Oncology

## 2015-05-01 ENCOUNTER — Encounter: Payer: Self-pay | Admitting: Oncology

## 2015-05-01 VITALS — BP 114/54 | HR 65 | Temp 98.2°F | Resp 20

## 2015-05-01 VITALS — BP 135/53 | HR 72 | Temp 97.5°F | Resp 18 | Ht 62.0 in | Wt 298.3 lb

## 2015-05-01 DIAGNOSIS — I878 Other specified disorders of veins: Secondary | ICD-10-CM

## 2015-05-01 DIAGNOSIS — D701 Agranulocytosis secondary to cancer chemotherapy: Secondary | ICD-10-CM

## 2015-05-01 DIAGNOSIS — T451X5A Adverse effect of antineoplastic and immunosuppressive drugs, initial encounter: Secondary | ICD-10-CM | POA: Diagnosis not present

## 2015-05-01 DIAGNOSIS — D6481 Anemia due to antineoplastic chemotherapy: Secondary | ICD-10-CM

## 2015-05-01 DIAGNOSIS — L03317 Cellulitis of buttock: Secondary | ICD-10-CM

## 2015-05-01 DIAGNOSIS — C569 Malignant neoplasm of unspecified ovary: Secondary | ICD-10-CM

## 2015-05-01 DIAGNOSIS — C562 Malignant neoplasm of left ovary: Secondary | ICD-10-CM

## 2015-05-01 DIAGNOSIS — D649 Anemia, unspecified: Secondary | ICD-10-CM

## 2015-05-01 LAB — CBC WITH DIFFERENTIAL/PLATELET
BASO%: 0.3 % (ref 0.0–2.0)
BASOS ABS: 0 10*3/uL (ref 0.0–0.1)
EOS ABS: 0 10*3/uL (ref 0.0–0.5)
EOS%: 0.6 % (ref 0.0–7.0)
HCT: 24.2 % — ABNORMAL LOW (ref 34.8–46.6)
HGB: 7.7 g/dL — ABNORMAL LOW (ref 11.6–15.9)
LYMPH%: 27.8 % (ref 14.0–49.7)
MCH: 27.4 pg (ref 25.1–34.0)
MCHC: 31.8 g/dL (ref 31.5–36.0)
MCV: 86.1 fL (ref 79.5–101.0)
MONO#: 0.7 10*3/uL (ref 0.1–0.9)
MONO%: 20.9 % — AB (ref 0.0–14.0)
NEUT#: 1.7 10*3/uL (ref 1.5–6.5)
NEUT%: 50.4 % (ref 38.4–76.8)
Platelets: 107 10*3/uL — ABNORMAL LOW (ref 145–400)
RBC: 2.81 10*6/uL — ABNORMAL LOW (ref 3.70–5.45)
RDW: 14.6 % — ABNORMAL HIGH (ref 11.2–14.5)
WBC: 3.5 10*3/uL — ABNORMAL LOW (ref 3.9–10.3)
lymph#: 1 10*3/uL (ref 0.9–3.3)

## 2015-05-01 LAB — COMPREHENSIVE METABOLIC PANEL
ALK PHOS: 73 U/L (ref 40–150)
ALT: 18 U/L (ref 0–55)
AST: 19 U/L (ref 5–34)
Albumin: 3.1 g/dL — ABNORMAL LOW (ref 3.5–5.0)
Anion Gap: 6 mEq/L (ref 3–11)
BUN: 20 mg/dL (ref 7.0–26.0)
CALCIUM: 8.8 mg/dL (ref 8.4–10.4)
CHLORIDE: 107 meq/L (ref 98–109)
CO2: 28 mEq/L (ref 22–29)
Creatinine: 1.1 mg/dL (ref 0.6–1.1)
EGFR: 60 mL/min/{1.73_m2} — AB (ref >90)
GLUCOSE: 96 mg/dL (ref 70–140)
POTASSIUM: 4.1 meq/L (ref 3.5–5.1)
SODIUM: 141 meq/L (ref 136–145)
Total Bilirubin: 0.3 mg/dL (ref 0.20–1.20)
Total Protein: 7.1 g/dL (ref 6.4–8.3)

## 2015-05-01 LAB — PREPARE RBC (CROSSMATCH)

## 2015-05-01 LAB — PROTIME-INR
INR: 1 — ABNORMAL LOW (ref 2.00–3.50)
PROTIME: 12 s (ref 10.6–13.4)

## 2015-05-01 LAB — ABO/RH: ABO/RH(D): O POS

## 2015-05-01 LAB — APTT: aPTT: 28 seconds (ref 24–37)

## 2015-05-01 LAB — CULTURE, BLOOD (SINGLE)

## 2015-05-01 MED ORDER — SODIUM CHLORIDE 0.9 % IV SOLN
250.0000 mL | Freq: Once | INTRAVENOUS | Status: AC
  Administered 2015-05-01: 250 mL via INTRAVENOUS

## 2015-05-01 MED ORDER — ACETAMINOPHEN 325 MG PO TABS
ORAL_TABLET | ORAL | Status: AC
  Filled 2015-05-01: qty 2

## 2015-05-01 MED ORDER — DEXTROSE 5 % IV SOLN
2.0000 g | INTRAVENOUS | Status: AC
  Administered 2015-05-01: 2 g via INTRAVENOUS
  Filled 2015-05-01: qty 2

## 2015-05-01 MED ORDER — ACETAMINOPHEN 325 MG PO TABS
650.0000 mg | ORAL_TABLET | Freq: Once | ORAL | Status: AC
  Administered 2015-05-01: 650 mg via ORAL

## 2015-05-01 MED ORDER — SODIUM CHLORIDE 0.9 % IV SOLN
250.0000 mL | Freq: Once | INTRAVENOUS | Status: DC
Start: 2015-05-01 — End: 2015-08-07

## 2015-05-01 NOTE — Telephone Encounter (Signed)
Gave patient avs report and appointments for January and February, including port placement appointment already on schedule for 1/24.

## 2015-05-01 NOTE — Progress Notes (Signed)
Both units of blood disappeared from flow sheet.  Unable to document volume on blood slips.   Unable to document anything in Epic but vitals.   Agricultural consultant notified.  2nd unit of blood was hung and double verified by Probation officer and Drucie Ip, RN.

## 2015-05-01 NOTE — Progress Notes (Signed)
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 Day 8 cycle 2 was held on 04-24-15 for ANC 0.7, with granix given 1-13.  She was for [facility] on 1-13, however was neutropenic. She was then seen by APP on 1-13 with granix given,  temp 99.5, blood and urine cultures negative, tape rash on arms as well as possible area of cellulitis LUE at site of previous IV access,K 3.1 supplemented, some diarrhea with C diff ordered but not collected. Hemoglobin on 1-13 was 8.0, tho patient denied symptoms. She was prescribed Keflex for the cellulitis, which she took x 2 doses then [facility] due to diarrhea. She FTKA APP follow up on 1-16 and FTKA PAC placement by IR on 04-30-15.  Patient reports temperatures yesterday 97 to 99, without shaking chills. The area of possible cellulitis LUE has improved, as has the tape rash.  Folliculitis on scalp is still present, tho better than just when she was losing hair. She has new area of tenderness and redness at coccyx, no injury, sore to direct pressure but not painful otherwise. She has been able to tolerate parenteral Rocephin better than oral antibiotics in past, per her history now. Bowels have been fairly normal last 2 days. She is eating well now, without nausea. She is more fatigued with activity, no chest pain, no bleeding. Slight swelling in LE better with elevation. Bladder ok.  Remainder of 10 point Review of Systems negative/ unchanged   Peripheral IV access not adequate to continue chemo Refused flu vaccine. No genetics testing CA 125 not marker (10 preop)     ONCOLOGIC HISTORY  ONCOLOGIC HISTORY Patient has been followed closely by Dr Corinna Capra of gyn, including hysteroscopy  D & C 08-2013 for bleeding, with benign endometrial polyp then ([id number]). She had CT at outside facility [id number] because of umbilical hernia, which showed 6 cm left adnexal mass with imaging consistent with ovarian cyst. She had follow up imaging in 12-2014 with cyst 6.9 cm, not complex, and CA 125 normal at 10 on 12-18-14. By repeat US 01-23-15 the area measured 9.7 x 7.0 x 6.4 cm with normal flow to ovary, no flow to cyst and no free fluid, endometrial stripe 3.9 mm; patient was then having some pelvic pressure. She had abdominal hysterectomy BSO by Dr Corinna Capra on 02-11-15, with operative findings of adhesions from left adnexal mass to bowel and pelvic sidewall, no ascites, normal appearing omentum, normal appendix; there was unavoidable intraoperative rupture during surgery. Pathology [id number]) left clear cell ovarian carcinoma at least 7 cy with capsule rupture, benign left tube, right ovary and tube and uterus. Cytology of washings negative for malignancy ([id number]). Post operative course has been uncomplicated. CT CAP 03-07-15 showed no apparent metastatic disease, with hepatic steatosis, abdominal aortic atherosclerosis and some fluid at paraumbilical hernia. Patient saw Dr Denman George in consultation on 02-24-15, with recommendation for 6 cycles carboplatin taxol adjuvantly. She had day 1 cycle 1 dose dense carbo taxol on12-14-16. Day 8 cycle 1 held with ANC 1.3, granix 480 added day after each treatment. D8C2 also delayed with neutropenia and other issues.   Objective:  Vital signs in last 24 hours:  BP 135/53 mmHg  Pulse 72  Temp(Src) 97.5 F (36.4 C) (Oral)  Resp 18  Ht 5' 2"  (1.575 m)  Wt 298 lb 4.8 oz (135.308 kg)  BMI 54.55 kg/m2  SpO2 100% Weight up 7 lbs from 04-15-15 Alert, oriented and appropriate, looks fatigued but not in acute distress. Respirations not labored seated in exam room.. Ambulatory with effort.  Alopecia  HEENT:PERRL, sclerae not icteric. Oral mucosa moist without lesions,  posterior pharynx clear.Scalp with scattered folliculitis areas thruout, which appear some improved from previously.  Neck supple. No JVD.  Lymphatics:no cervical,supraclavicular adenopathy Resp: clear to auscultation bilaterally and normal percussion bilaterally Cardio: regular rate and rhythm. No gallop. GI: abdomen obese, soft, nontender, not distended, no mass or organomegaly. Normally active bowel sounds. Surgical incision not remarkable. Overlying tip of coccyx is area of moderate erythema ~ 2 cm diameter with central blister 93m, no fluctuance, no other vesicles, does not look to be zoster. Musculoskeletal/ Extremities: trace pedal edema bilaterally without cords, tenderness Neuro: no significant peripheral neuropathy. Otherwise nonfocal Psych appropriate mood and affect Skin as above, otherwise without rash including UE, ecchymosis, petechiae  Lab Results:  Results for orders placed or performed in visit on 05/01/15  CBC with Differential  Result Value Ref Range   WBC 3.5 (L) 3.9 - 10.3 10e3/uL   NEUT# 1.7 1.5 - 6.5 10e3/uL   HGB 7.7 (L) 11.6 - 15.9 g/dL   HCT 24.2 (L) 34.8 - 46.6 %   Platelets 107 (L) 145 - 400 10e3/uL   MCV 86.1 79.5 - 101.0 fL   MCH 27.4 25.1 - 34.0 pg   MCHC 31.8 31.5 - 36.0 g/dL   RBC 2.81 (L) 3.70 - 5.45 10e6/uL   RDW 14.6 (H) 11.2 - 14.5 %   lymph# 1.0 0.9 - 3.3 10e3/uL   MONO# 0.7 0.1 - 0.9 10e3/uL   Eosinophils Absolute 0.0 0.0 - 0.5 10e3/uL   Basophils Absolute 0.0 0.0 - 0.1 10e3/uL   NEUT% 50.4 38.4 - 76.8 %   LYMPH% 27.8 14.0 - 49.7 %   MONO% 20.9 (H) 0.0 - 14.0 %   EOS% 0.6 0.0 - 7.0 %   BASO% 0.3 0.0 - 2.0 %  Comprehensive metabolic panel  Result Value Ref Range   Sodium 141 136 - 145 mEq/L   Potassium 4.1 3.5 - 5.1 mEq/L   Chloride 107 98 - 109 mEq/L   CO2 28 22 - 29 mEq/L   Glucose 96 70 - 140 mg/dl   BUN 20.0 7.0 - 26.0 mg/dL   Creatinine 1.1 0.6 - 1.1 mg/dL   Total Bilirubin 0.30 0.20 - 1.20 mg/dL   Alkaline Phosphatase 73 40 -  150 U/L   AST 19 5 - 34 U/L   ALT 18 0 - 55 U/L   Total Protein 7.1 6.4 - 8.3 g/dL   Albumin 3.1 (L) 3.5 - 5.0 g/dL   Calcium 8.8 8.4 - 10.4 mg/dL   Anion Gap 6 3 - 11 mEq/L   EGFR 60 (L) >90 ml/min/1.73 m2  Protime-INR  Result Value Ref Range   Protime 12.0 10.6 - 13.4 Seconds   INR 1.00 (L) 2.00 - 3.50   Lovenox No      Studies/Results:  Ir Fluoro Guide Cv Line Right  05/06/2015  INDICATION: History of ovarian cancer. In need of durable intravenous access for chemotherapy administration. EXAM: IMPLANTED PORT A CATH PLACEMENT WITH ULTRASOUND AND FLUOROSCOPIC GUIDANCE COMPARISON:  Chest CT - 03/07/2015 MEDICATIONS: Vancomycin 1.5 IV; The antibiotic was administered within an appropriate time interval prior to skin puncture. ANESTHESIA/SEDATION: Versed  4 mg IV; Fentanyl 100 mcg IV; Total Moderate Sedation Time 23 minutes The patient was continuously monitored during the procedure by the interventional radiology nurse under my direct supervision. CONTRAST:  None FLUOROSCOPY TIME:  30 seconds (23 mGy) COMPLICATIONS: None immediate. PROCEDURE: The procedure, risks, benefits, and alternatives were explained to the patient. Questions regarding the procedure were encouraged and answered. The patient understands and consents to the procedure. The right neck and chest were prepped with chlorhexidine in a sterile fashion, and a sterile drape was applied covering the operative field. Maximum barrier sterile technique with sterile gowns and gloves were used for the procedure. A timeout was performed prior to the initiation of the procedure. Local anesthesia was provided with 1% lidocaine with epinephrine. After creating a small venotomy incision, a micropuncture kit was utilized to access the internal jugular vein under direct, real-time ultrasound guidance. Ultrasound image documentation was performed. The microwire was kinked to measure appropriate catheter length. A subcutaneous port pocket was then created  along the upper chest wall utilizing a combination of sharp and blunt dissection. The pocket was irrigated with sterile saline. A single lumen power injectable port was chosen for placement. The 8 Fr catheter was tunneled from the port pocket site to the venotomy incision. The port was placed in the pocket. The external catheter was trimmed to appropriate length. At the venotomy, an 8 Fr peel-away sheath was placed over a guidewire under fluoroscopic guidance. The catheter was then placed through the sheath and the sheath was removed. Final catheter positioning was confirmed and documented with a fluoroscopic spot radiograph. The port was accessed with a Huber needle, aspirated and flushed with heparinized saline. The venotomy site was closed with an interrupted 4-0 Vicryl suture. The port pocket incision was closed with interrupted 2-0 Vicryl suture and the skin was opposed with a running subcuticular 4-0 Vicryl suture. Dermabond and Steri-strips were applied to both incisions. Dressings were placed. The patient tolerated the procedure well without immediate post procedural complication. FINDINGS: After catheter placement, the tip lies within the superior cavoatrial junction. The catheter aspirates and flushes normally and is ready for immediate use. IMPRESSION: Successful placement of a right internal jugular approach power injectable Port-A-Cath. The catheter is ready for immediate use. Electronically Signed   By: Sandi Mariscal M.D.   On: 05/06/2015 14:35   Ir US Guide Vasc Access Right  05/06/2015  INDICATION: History of ovarian cancer. In need of durable intravenous access for chemotherapy administration. EXAM: IMPLANTED PORT A CATH PLACEMENT WITH ULTRASOUND AND FLUOROSCOPIC GUIDANCE COMPARISON:  Chest CT - 03/07/2015 MEDICATIONS: Vancomycin 1.5 IV; The antibiotic was administered within an appropriate time interval prior to skin puncture. ANESTHESIA/SEDATION: Versed 4 mg IV; Fentanyl 100 mcg IV; Total  Moderate Sedation Time 23 minutes The patient was continuously monitored during the procedure by the interventional radiology nurse under my direct supervision. CONTRAST:  None FLUOROSCOPY TIME:  30 seconds (23 mGy) COMPLICATIONS: None immediate. PROCEDURE: The procedure, risks, benefits, and alternatives were explained to the patient. Questions regarding the procedure were encouraged and answered. The patient understands and consents to the procedure. The right neck and chest were prepped with chlorhexidine in a sterile fashion, and a sterile drape was applied covering the operative field. Maximum barrier sterile technique with sterile gowns and gloves were used for the procedure. A timeout was performed prior to the initiation of the procedure. Local anesthesia was provided with 1% lidocaine with epinephrine. After creating a small venotomy incision, a micropuncture kit was  utilized to access the internal jugular vein under direct, real-time ultrasound guidance. Ultrasound image documentation was performed. The microwire was kinked to measure appropriate catheter length. A subcutaneous port pocket was then created along the upper chest wall utilizing a combination of sharp and blunt dissection. The pocket was irrigated with sterile saline. A single lumen power injectable port was chosen for placement. The 8 Fr catheter was tunneled from the port pocket site to the venotomy incision. The port was placed in the pocket. The external catheter was trimmed to appropriate length. At the venotomy, an 8 Fr peel-away sheath was placed over a guidewire under fluoroscopic guidance. The catheter was then placed through the sheath and the sheath was removed. Final catheter positioning was confirmed and documented with a fluoroscopic spot radiograph. The port was accessed with a Huber needle, aspirated and flushed with heparinized saline. The venotomy site was closed with an interrupted 4-0 Vicryl suture. The port pocket  incision was closed with interrupted 2-0 Vicryl suture and the skin was opposed with a running subcuticular 4-0 Vicryl suture. Dermabond and Steri-strips were applied to both incisions. Dressings were placed. The patient tolerated the procedure well without immediate post procedural complication. FINDINGS: After catheter placement, the tip lies within the superior cavoatrial junction. The catheter aspirates and flushes normally and is ready for immediate use. IMPRESSION: Successful placement of a right internal jugular approach power injectable Port-A-Cath. The catheter is ready for immediate use. Electronically Signed   By: Sandi Mariscal M.D.   On: 05/06/2015 14:35    Medications: I have reviewed the patient's current medications. Hold chemo until PAC placed. Rocephin today and 1-20 as long as IV access possible Has not been willing to try oral iron.  DISCUSSION Hemoglobin down to 7.7, symptomatic. She agrees to 2 units PRBCs, which can be done today.  She agrees to Rocephin for the small area possible early cellulitis at coccyx, and will avoid pressure there. Antibiotic may improve scalp folliculitis also, tho that is some better with topicals. If area at coccyx persists, would try to encourage her to take Keflex that she has available. She agrees to [facility], order placed for that to be rescheduled (thank you)  Assessment/Plan:  1. Clear cell carcinoma of ovary clinically IC: found incidentally on CT done for unrelated umbilical hernia, normal CA 125, post abdominal hysterectomy BSO by Dr Corinna Capra 02-11-15. Adjuvant carboplatin taxol begun 03-26-15 using dose dense regimen, delays for chemo neutropenia despite gCSF used thus far. Will need to used gCSF ongoing with chemo. 2.New area of cellulitis or pressure at coccyx, folliculitis scalp: WIll try 2 doses IV rocephin as patient has tolerated that better than po in past per history. May need to continue po Keflex if possible for the coccyx area depending on  response 3. Progressive anemia: likely chemo related, iron studies low/ low normal in early Apr 26, 2023, patient not willing to start oral iron. Will transfuse 2 units PRBCs today and follow 4.Low temperature elevations, etiology not clear but all cultures negative.  5.peripheral IV access not adequate to continue chemo. PAC now rescheduled x2. 6.history of crohn's disease/ ileitis, which has not required intervention in past 10 years. Known to [facility] 7.history of melanoma right posterior thigh 2003 with 2 sentinel nodes negative. Followed by dermatology 8.umbilical hernia since 9562, stable 9.multiple drug intolerances. EMR notes is able to take Rocephin and Keflex 10.HTN x ~ 10 years followed now by PCP 11.GERD longstanding and unchanged 12.hepatic steatosis by CT 13. Thoracic and abdominal  aortic atherosclerosis by CT 14. History of chronic anxiety and panic attacks 15.morbid obesity, BMI >50 16.multiple drug and antibiotic intolerances. 17refuses flu vaccine: discussed Tamiflu, either to have available or prescription at onset of symptoms or if known exposure. 18.asthma: on regular treatment, exacerbations generally with respiratory illnesses.   All questions answered. Patient and husband understand and are in agreement with recommendations and plans. Orders for PRBCs and Rocephin placed, chemo moved. Time spent 30 min including >50% counseling and coordination of care. Cc PCP   Gordy Levan, MD   05/07/2015, 5:49 PM

## 2015-05-01 NOTE — Patient Instructions (Signed)
Blood Transfusion  A blood transfusion is a procedure in which you receive donated blood through an IV tube. You may need a blood transfusion because of illness, surgery, or injury. The blood may come from a donor, or it may be your own blood that you donated previously. The blood given in a transfusion is made up of different types of cells. You may receive:  Red blood cells. These carry oxygen and replace lost blood.  Platelets. These control bleeding.  Plasma. Thishelps blood to clot. If you have hemophilia or another clotting disorder, you may also receive other types of blood products. LET [facility] CARE PROVIDER KNOW ABOUT:  Any allergies you have.  All medicines you are taking, including vitamins, herbs, eye drops, creams, and over-the-counter medicines.  Previous problems you or members of your family have had with the use of anesthetics.  Any blood disorders you have.  Previous surgeries you have had.  Any medical conditions you may have.  Any previous reactions you have had during a blood transfusion.  RISKS AND COMPLICATIONS Generally, this is a safe procedure. However, problems may occur, including:  Having an allergic reaction to something in the donated blood.  Fever. This may be a reaction to the white blood cells in the transfused blood.  Iron overload. This can happen from having many transfusions.  Transfusion-related acute lung injury (TRALI). This is a rare reaction that causes lung damage. The cause is not known.TRALI can occur within hours of a transfusion or several days later.  Sudden (acute) or delayed hemolytic reactions. This happens if your blood does not match the cells in your transfusion. Your body's defense system (immune system) may try to attack the new cells. This complication is rare.  Infection. This is rare. BEFORE THE PROCEDURE  You may have a blood test to determine your blood type. This is necessary to know what kind of blood your  body will accept.  If you are going to have a planned surgery, you may donate your own blood. This may be done in case you need to have a transfusion.  If you have had an allergic reaction to a transfusion in the past, you may be given medicine to help prevent a reaction. Take this medicine only as directed by your health care provider.  You will have your temperature, blood pressure, and pulse monitored before the transfusion. PROCEDURE   An IV will be started in your hand or arm.  The bag of donated blood will be attached to your IV tube and given into your vein.  Your temperature, blood pressure, and pulse will be monitored regularly during the transfusion. This monitoring is done to detect early signs of a transfusion reaction.  If you have any signs or symptoms of a reaction, your transfusion will be stopped and you may be given medicine.  When the transfusion is over, your IV will be removed.  Pressure may be applied to the IV site for a few minutes.  A bandage (dressing) will be applied. The procedure may vary among health care providers and hospitals. AFTER THE PROCEDURE  Your blood pressure, temperature, and pulse will be monitored regularly.   This information is not intended to replace advice given to you by your health care provider. Make sure you discuss any questions you have with your health care provider.   Document Released: 03/26/2000 Document Revised: 04/19/2014 Document Reviewed: 02/06/2014 Elsevier Interactive Patient Education 2016 Else[employer].    Ceftriaxone injection (rocephin) What is  this medicine? CEFTRIAXONE (sef try AX one) is a cephalosporin antibiotic. It is used to treat certain kinds of bacterial infections. It will not work for colds, flu, or other viral infections. This medicine may be used for other purposes; ask your health care provider or pharmacist if you have questions. What should I tell my health care provider before I take this  medicine? They need to know if you have any of these conditions: -any chronic illness -bowel disease, like colitis -both kidney and liver disease -high bilirubin level in newborn patients -an unusual or allergic reaction to ceftriaxone, other cephalosporin or penicillin antibiotics, foods, dyes, or preservatives -pregnant or trying to get pregnant -breast-feeding How should I use this medicine? This medicine is injected into a muscle or infused it into a vein. It is usually given in a medical office or clinic. If you are to give this medicine you will be taught how to inject it. Follow instructions carefully. Use your doses at regular intervals. Do not take your medicine more often than directed. Do not skip doses or stop your medicine early even if you feel better. Do not stop taking except on your doctor's advice. Talk to your pediatrician regarding the use of this medicine in children. Special care may be needed. Overdosage: If you think you have taken too much of this medicine contact a poison control center or emergency room at once. NOTE: This medicine is only for you. Do not share this medicine with others. What if I miss a dose? If you miss a dose, take it as soon as you can. If it is almost time for your next dose, take only that dose. Do not take double or extra doses. What may interact with this medicine? Do not take this medicine with any of the following medications: -intravenous calcium This medicine may also interact with the following medications: -birth control pills This list may not describe all possible interactions. Give your health care provider a list of all the medicines, herbs, non-prescription drugs, or dietary supplements you use. Also tell them if you smoke, drink alcohol, or use illegal drugs. Some items may interact with your medicine. What should I watch for while using this medicine? Tell your doctor or health care professional if your symptoms do not improve or  if they get worse. Do not treat diarrhea with over the counter products. Contact your doctor if you have diarrhea that lasts more than 2 days or if it is severe and watery. If you are being treated for a sexually transmitted disease, avoid sexual contact until you have finished your treatment. Having sex can infect your sexual partner. Calcium may bind to this medicine and cause lung or kidney problems. Avoid calcium products while taking this medicine and for 48 hours after taking the last dose of this medicine. What side effects may I notice from receiving this medicine? Side effects that you should report to your doctor or health care professional as soon as possible: -allergic reactions like skin rash, itching or hives, swelling of the face, lips, or tongue -breathing problems -fever, chills -irregular heartbeat -pain when passing urine -seizures -stomach pain, cramps -unusual bleeding, bruising -unusually weak or tired Side effects that usually do not require medical attention (report to your doctor or health care professional if they continue or are bothersome): -diarrhea -dizzy, drowsy -headache -nausea, vomiting -pain, swelling, irritation where injected -stomach upset -sweating This list may not describe all possible side effects. Call your doctor for medical advice about  side effects. You may report side effects to FDA at 1-800-FDA-1088. Where should I keep my medicine? Keep out of the reach of children. Store at room temperature below 25 degrees C (77 degrees F). Protect from light. Throw away any unused vials after the expiration date. NOTE: This sheet is a summary. It may not cover all possible information. If you have questions about this medicine, talk to your doctor, pharmacist, or health care provider.    2016, Elsevier/Gold Standard. (2013-10-15 09:14:54)

## 2015-05-02 ENCOUNTER — Other Ambulatory Visit: Payer: Self-pay | Admitting: Oncology

## 2015-05-02 ENCOUNTER — Telehealth: Payer: Self-pay

## 2015-05-02 ENCOUNTER — Ambulatory Visit (HOSPITAL_BASED_OUTPATIENT_CLINIC_OR_DEPARTMENT_OTHER): Payer: Medicare HMO

## 2015-05-02 VITALS — BP 120/66 | HR 78 | Temp 98.3°F | Resp 18

## 2015-05-02 DIAGNOSIS — L03317 Cellulitis of buttock: Secondary | ICD-10-CM

## 2015-05-02 LAB — TYPE AND SCREEN
ABO/RH(D): O POS
ANTIBODY SCREEN: NEGATIVE
UNIT DIVISION: 0
Unit division: 0

## 2015-05-02 MED ORDER — DIPHENHYDRAMINE HCL 50 MG PO TABS
25.0000 mg | ORAL_TABLET | Freq: Every evening | ORAL | Status: DC | PRN
Start: 2015-05-02 — End: 2016-03-22

## 2015-05-02 MED ORDER — DEXTROSE 5 % IV SOLN
2.0000 g | INTRAVENOUS | Status: DC
  Administered 2015-05-02: 2 g via INTRAVENOUS
  Filled 2015-05-02: qty 2

## 2015-05-02 NOTE — Telephone Encounter (Signed)
Left a message in [name]'s vm that Dr. Marko Plume feels that it would be good to continue Keflex 500 mg every 6 hours for the cellulitis on buttocks. Prescription refill sent to her pharmacy for the benadryl as she requested while in the infusion room today

## 2015-05-02 NOTE — Patient Instructions (Signed)
Ceftriaxone injection What is this medicine? CEFTRIAXONE (sef try AX one) is a cephalosporin antibiotic. It is used to treat certain kinds of bacterial infections. It will not work for colds, flu, or other viral infections. This medicine may be used for other purposes; ask your health care provider or pharmacist if you have questions. What should I tell my health care provider before I take this medicine? They need to know if you have any of these conditions: -any chronic illness -bowel disease, like colitis -both kidney and liver disease -high bilirubin level in newborn patients -an unusual or allergic reaction to ceftriaxone, other cephalosporin or penicillin antibiotics, foods, dyes, or preservatives -pregnant or trying to get pregnant -breast-feeding How should I use this medicine? This medicine is injected into a muscle or infused it into a vein. It is usually given in a medical office or clinic. If you are to give this medicine you will be taught how to inject it. Follow instructions carefully. Use your doses at regular intervals. Do not take your medicine more often than directed. Do not skip doses or stop your medicine early even if you feel better. Do not stop taking except on your doctor's advice. Talk to your pediatrician regarding the use of this medicine in children. Special care may be needed. Overdosage: If you think you have taken too much of this medicine contact a poison control center or emergency room at once. NOTE: This medicine is only for you. Do not share this medicine with others. What if I miss a dose? If you miss a dose, take it as soon as you can. If it is almost time for your next dose, take only that dose. Do not take double or extra doses. What may interact with this medicine? Do not take this medicine with any of the following medications: -intravenous calcium This medicine may also interact with the following medications: -birth control pills This list may  not describe all possible interactions. Give your health care provider a list of all the medicines, herbs, non-prescription drugs, or dietary supplements you use. Also tell them if you smoke, drink alcohol, or use illegal drugs. Some items may interact with your medicine. What should I watch for while using this medicine? Tell your doctor or health care professional if your symptoms do not improve or if they get worse. Do not treat diarrhea with over the counter products. Contact your doctor if you have diarrhea that lasts more than 2 days or if it is severe and watery. If you are being treated for a sexually transmitted disease, avoid sexual contact until you have finished your treatment. Having sex can infect your sexual partner. Calcium may bind to this medicine and cause lung or kidney problems. Avoid calcium products while taking this medicine and for 48 hours after taking the last dose of this medicine. What side effects may I notice from receiving this medicine? Side effects that you should report to your doctor or health care professional as soon as possible: -allergic reactions like skin rash, itching or hives, swelling of the face, lips, or tongue -breathing problems -fever, chills -irregular heartbeat -pain when passing urine -seizures -stomach pain, cramps -unusual bleeding, bruising -unusually weak or tired Side effects that usually do not require medical attention (report to your doctor or health care professional if they continue or are bothersome): -diarrhea -dizzy, drowsy -headache -nausea, vomiting -pain, swelling, irritation where injected -stomach upset -sweating This list may not describe all possible side effects. Call your doctor for  medical advice about side effects. You may report side effects to FDA at 1-800-FDA-1088. Where should I keep my medicine? Keep out of the reach of children. Store at room temperature below 25 degrees C (77 degrees F). Protect from  light. Throw away any unused vials after the expiration date. NOTE: This sheet is a summary. It may not cover all possible information. If you have questions about this medicine, talk to your doctor, pharmacist, or health care provider.    2016, Elsevier/Gold Standard. (2013-10-15 09:14:54)

## 2015-05-05 ENCOUNTER — Other Ambulatory Visit: Payer: Self-pay | Admitting: Radiology

## 2015-05-06 ENCOUNTER — Other Ambulatory Visit: Payer: Self-pay | Admitting: Oncology

## 2015-05-06 ENCOUNTER — Ambulatory Visit (HOSPITAL_COMMUNITY)
Admission: RE | Admit: 2015-05-06 | Discharge: 2015-05-06 | Disposition: A | Discharge status: Home / Self Care | Payer: Medicare HMO | Source: Ambulatory Visit | Attending: Oncology | Admitting: Oncology

## 2015-05-06 ENCOUNTER — Encounter (HOSPITAL_COMMUNITY): Payer: Self-pay

## 2015-05-06 DIAGNOSIS — C562 Malignant neoplasm of left ovary: Secondary | ICD-10-CM | POA: Diagnosis not present

## 2015-05-06 DIAGNOSIS — Z91040 Latex allergy status: Secondary | ICD-10-CM | POA: Insufficient documentation

## 2015-05-06 DIAGNOSIS — Z880 Allergy status to penicillin: Secondary | ICD-10-CM | POA: Insufficient documentation

## 2015-05-06 DIAGNOSIS — Z7982 Long term (current) use of aspirin: Secondary | ICD-10-CM | POA: Insufficient documentation

## 2015-05-06 DIAGNOSIS — C569 Malignant neoplasm of unspecified ovary: Secondary | ICD-10-CM | POA: Diagnosis present

## 2015-05-06 LAB — APTT: APTT: 28 s (ref 24–37)

## 2015-05-06 LAB — CBC
HCT: 29.2 % — ABNORMAL LOW (ref 36.0–46.0)
HEMOGLOBIN: 9.2 g/dL — AB (ref 12.0–15.0)
MCH: 27.9 pg (ref 26.0–34.0)
MCHC: 31.5 g/dL (ref 30.0–36.0)
MCV: 88.5 fL (ref 78.0–100.0)
PLATELETS: 49 10*3/uL — AB (ref 150–400)
RBC: 3.3 MIL/uL — AB (ref 3.87–5.11)
RDW: 15.2 % (ref 11.5–15.5)
WBC: 3.9 10*3/uL — ABNORMAL LOW (ref 4.0–10.5)

## 2015-05-06 LAB — BASIC METABOLIC PANEL
Anion gap: 14 (ref 5–15)
BUN: 22 mg/dL — ABNORMAL HIGH (ref 6–20)
CHLORIDE: 104 mmol/L (ref 101–111)
CO2: 25 mmol/L (ref 22–32)
CREATININE: 1.16 mg/dL — AB (ref 0.44–1.00)
Calcium: 9.4 mg/dL (ref 8.9–10.3)
GFR calc non Af Amer: 52 mL/min — ABNORMAL LOW (ref >60)
GLUCOSE: 91 mg/dL (ref 65–99)
Potassium: 4 mmol/L (ref 3.5–5.1)
Sodium: 143 mmol/L (ref 135–145)

## 2015-05-06 LAB — PROTIME-INR
INR: 0.96 (ref 0.00–1.49)
Prothrombin Time: 13 seconds (ref 11.6–15.2)

## 2015-05-06 MED ORDER — MIDAZOLAM HCL 2 MG/2ML IJ SOLN
INTRAMUSCULAR | Status: AC
  Filled 2015-05-06: qty 4

## 2015-05-06 MED ORDER — HEPARIN SOD (PORK) LOCK FLUSH 100 UNIT/ML IV SOLN
INTRAVENOUS | Status: AC
  Filled 2015-05-06: qty 5

## 2015-05-06 MED ORDER — MIDAZOLAM HCL 2 MG/2ML IJ SOLN
INTRAMUSCULAR | Status: AC
  Filled 2015-05-06: qty 2

## 2015-05-06 MED ORDER — HEPARIN SOD (PORK) LOCK FLUSH 100 UNIT/ML IV SOLN
INTRAVENOUS | Status: AC | PRN
  Administered 2015-05-06: 500 [IU]

## 2015-05-06 MED ORDER — MIDAZOLAM HCL 2 MG/2ML IJ SOLN
INTRAMUSCULAR | Status: AC | PRN
  Administered 2015-05-06 (×4): 1 mg via INTRAVENOUS

## 2015-05-06 MED ORDER — FENTANYL CITRATE (PF) 100 MCG/2ML IJ SOLN
INTRAMUSCULAR | Status: AC | PRN
  Administered 2015-05-06 (×2): 50 ug via INTRAVENOUS

## 2015-05-06 MED ORDER — FENTANYL CITRATE (PF) 100 MCG/2ML IJ SOLN
INTRAMUSCULAR | Status: AC
  Filled 2015-05-06: qty 2

## 2015-05-06 MED ORDER — SODIUM CHLORIDE 0.9 % IV SOLN
Freq: Once | INTRAVENOUS | Status: AC
  Administered 2015-05-06: 500 mL via INTRAVENOUS

## 2015-05-06 MED ORDER — LIDOCAINE-EPINEPHRINE 2 %-1:100000 IJ SOLN
INTRAMUSCULAR | Status: AC
  Filled 2015-05-06: qty 1

## 2015-05-06 MED ORDER — VANCOMYCIN HCL IN DEXTROSE 1-5 GM/200ML-% IV SOLN
1000.0000 mg | INTRAVENOUS | Status: DC
  Filled 2015-05-06: qty 200

## 2015-05-06 MED ORDER — VANCOMYCIN HCL 10 G IV SOLR
1500.0000 mg | INTRAVENOUS | Status: AC
  Administered 2015-05-06: 1500 mg via INTRAVENOUS
  Filled 2015-05-06: qty 1500

## 2015-05-06 MED ORDER — PROMETHAZINE HCL 25 MG PO TABS
25.0000 mg | ORAL_TABLET | Freq: Once | ORAL | Status: AC
  Administered 2015-05-06: 25 mg via ORAL
  Filled 2015-05-06 (×2): qty 1

## 2015-05-06 NOTE — Progress Notes (Signed)
Patient ID: [name], female   DOB: [birth year], 55 y.o.   MRN: [id number]    Referring Physician(s): Magrinat,Gustav C  Chief Complaint:  Ovarian cancer  Subjective: Pt seen recently by IR on 04/25/15 for OP port a cath placement; however, case was postponed until today secondary to neutropenia and recent temp elevation. See note for additional details. She is currently afebrile and follow up WBC today is 3.87. plts 49k. She continues to have some fatigue and weakness as well as loose stools following rocephin.   Allergies: Rocephin; Amoxicillin; Butorphanol tartrate; Cephalosporins; Ciprofloxacin hcl; Clindamycin/lincomycin; Codeine; Effexor; Levofloxacin; Losartan; Moxifloxacin; Paxil; Latex; and Tape  Medications: Prior to Admission medications   Medication Sig Start Date End Date Taking? Authorizing Provider  alprazolam Duanne Moron) 2 MG tablet Take 2 mg by mouth 2 (two) times daily. 1/2 tab AM and 1 tab PM   Yes Historical Provider, MD  amLODipine (NORVASC) 2.5 MG tablet Take 2.5 mg by mouth daily.   Yes Historical Provider, MD  budesonide-formoterol (SYMBICORT) 160-4.5 MCG/ACT inhaler Inhale 2 puffs into the lungs 2 (two) times daily.   Yes Historical Provider, MD  diphenhydrAMINE (BENADRYL) 50 MG tablet Take 0.5 tablets (25 mg total) by mouth at bedtime as needed for itching. 05/02/15  Yes Lennis Marion Downer, MD  famotidine (PEPCID) 20 MG tablet Take 1 tablet (20 mg total) by mouth 2 (two) times daily. 04/25/15  Yes Susanne Borders, NP  ibuprofen (ADVIL,MOTRIN) 600 MG tablet Take 1 tablet (600 mg total) by mouth daily as needed (mild pain). 04/03/15  Yes Lennis Marion Downer, MD  Lactobacillus (ACIDOPHILUS PO) Take 2 capsules by mouth daily.    Yes Historical Provider, MD  levocetirizine (XYZAL) 5 MG tablet Take 5 mg by mouth every morning.    Yes Historical Provider, MD  montelukast (SINGULAIR) 10 MG tablet Take 10 mg by mouth at bedtime.   Yes Historical Provider, MD  nystatin  (MYCOSTATIN/NYSTOP) 100000 UNIT/GM POWD Apply powder to groin twice a day. 04/10/15  Yes Lennis Marion Downer, MD  potassium chloride SA (K-DUR,KLOR-CON) 20 MEQ tablet Take 1 tablet (20 mEq total) by mouth 2 (two) times daily. 04/25/15  Yes Susanne Borders, NP  promethazine (PHENERGAN) 25 MG tablet Take 25 mg by mouth every 6 (six) hours as needed for nausea.    Yes Historical Provider, MD  sertraline (ZOLOFT) 50 MG tablet Take 50 mg by mouth daily.   Yes Historical Provider, MD  triamterene-hydrochlorothiazide (MAXZIDE) 75-50 MG per tablet Take 1 tablet by mouth daily.   Yes Historical Provider, MD  vitamin E 400 UNIT capsule Take 400 Units by mouth daily.   Yes Historical Provider, MD  albuterol (PROVENTIL HFA;VENTOLIN HFA) 108 (90 BASE) MCG/ACT inhaler Inhale 2 puffs into the lungs every 4 (four) hours as needed for wheezing or shortness of breath.    Historical Provider, MD  Aspirin-Acetaminophen-Caffeine (PAMPRIN MAX PO) Take 2 tablets by mouth daily as needed (pain).     Historical Provider, MD  cephALEXin (KEFLEX) 500 MG capsule Take 1 capsule (500 mg total) by mouth 4 (four) times daily. 04/25/15   Susanne Borders, NP  Cholecalciferol (VITAMIN D3) 5000 UNITS TABS Take 1 tablet by mouth daily.    Historical Provider, MD  dexamethasone (DECADRON) 4 MG tablet Take 5 tablets with food 12 hours prior to Taxol chemotherapy 04/10/15   Lennis Marion Downer, MD  Ferrous Fumarate (HEMOCYTE) 324 (106 Fe) MG TABS Take 1 tablet (106 mg of iron total) by mouth  daily. 04/16/15   Lennis Marion Downer, MD  fluconazole (DIFLUCAN) 150 MG tablet Take 150 mg by mouth as needed.    Historical Provider, MD  glucosamine-chondroitin 500-400 MG tablet Take 2 tablets by mouth 2 (two) times daily.     Historical Provider, MD  hydroxypropyl methylcellulose / hypromellose (ISOPTO TEARS / GONIOVISC) 2.5 % ophthalmic solution Place 2 drops into both eyes as needed for dry eyes.    Historical Provider, MD  ketoconazole (NIZORAL) 2 % cream  Apply 1 application topically daily as needed for irritation.    Historical Provider, MD  lidocaine-prilocaine (EMLA) cream Apply to Porta-cath site 1-2 hours prior to access as directed. 04/25/15   Lennis Marion Downer, MD  LORazepam (ATIVAN) 0.5 MG tablet One SL or po every 6 hours as needed for nausea. Will make drowsy. Do not use xanax within 4 hours of lorazepam 03/24/15   Lennis Marion Downer, MD  Omega-3 Fatty Acids (FISH OIL) 1000 MG CAPS Take 2 capsules by mouth daily.    Historical Provider, MD  ondansetron (ZOFRAN) 8 MG tablet Take 1 tablet (8 mg total) by mouth every 8 (eight) hours as needed for nausea or vomiting. Will not make drowsy 03/24/15   Lennis Marion Downer, MD  orphenadrine (NORFLEX) 100 MG tablet Take 100 mg by mouth 2 (two) times daily as needed for mild pain.    Historical Provider, MD  ranitidine (ZANTAC) 150 MG tablet Take 150 mg by mouth 2 (two) times daily.  05/14/11   Historical Provider, MD  triamcinolone cream (KENALOG) 0.1 % Apply 1 application topically 2 (two) times daily as needed.     Historical Provider, MD     Vital Signs: BP 125/76 mmHg  Pulse 71  Temp(Src) 97.7 F (36.5 C) (Oral)  Resp 16  Ht 5' 2.75" (1.594 m)  Wt 298 lb (135.172 kg)  BMI 53.20 kg/m2  SpO2 99%  LMP  (LMP Unknown)  Physical Exam  Constitutional: She is oriented to person, place, and time. She appears well-developed and well-nourished.  Cardiovascular: Normal rate and regular rhythm.   Pulmonary/Chest: Effort normal and breath sounds normal.  Abdominal: Soft. There is no tenderness.  obese  Musculoskeletal: Normal range of motion.  Neurological: She is alert and oriented to person, place, and time.  Skin:  Improving forearm rashes    Imaging: No results found.  Labs:  CBC:  Recent Labs  04/17/15 1142 04/25/15 1400 04/25/15 1609 05/01/15 1034  WBC 3.1* 1.5* 1.5* 3.5*  HGB 9.7* 8.0* 8.0* 7.7*  HCT 30.1* 25.2* 24.9* 24.2*  PLT 180 186 185 107*    COAGS:  Recent Labs   04/25/15 1400 05/01/15 1034 05/01/15 1105  INR 1.01 1.00*  --   APTT  --   --  28    BMP:  Recent Labs  02/06/15 1450  04/10/15 1042 04/15/15 1305 04/25/15 1610 05/01/15 1034  NA 137  < > 137 138 139 141  K 3.4*  < > 3.7 3.8 3.1* 4.1  CL 100*  --   --   --   --   --   CO2 29  < > 25 31* 28 28  GLUCOSE 82  < > 105 92 85 96  BUN 25*  < > 22.1 23.8 24.8 20.0  CALCIUM 8.9  < > 9.1 9.1 8.6 8.8  CREATININE 1.12*  < > 1.1 0.9 1.2* 1.1  GFRNONAA 55*  --   --   --   --   --  GFRAA >60  --   --   --   --   --   < > = values in this interval not displayed.  LIVER FUNCTION TESTS:  Recent Labs  04/10/15 1042 04/15/15 1305 04/25/15 1610 05/01/15 1034  BILITOT 0.35 0.39 0.39 0.30  AST 18 19 23 19   ALT 24 23 25 18   ALKPHOS 82 84 74 73  PROT 8.1 7.4 7.5 7.1  ALBUMIN 3.5 3.3* 3.4* 3.1*    Assessment and Plan: 55 y.o. 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 WBC 3.87, PLTS 49K.      Electronically Signed: D. Rowe Robert 05/06/2015, 10:50 AM   I spent a total of  at the the patient's bedside AND on the patient's hospital floor or unit, greater than 50% of which was counseling/coordinating care for port a cath placement

## 2015-05-06 NOTE — Progress Notes (Signed)
Pt is requesting her" Phenergan 62m pill for nausea". Zofran causes constipation. [facility]. Pt medicated with po Phenergan and states she is" happy about this"

## 2015-05-06 NOTE — Procedures (Signed)
Successful placement of right IJ approach port-a-cath with tip at the superior caval atrial junction. The catheter is ready for immediate use. Estimated Blood Loss: Minimal No immediate post procedural complications.  Ronny Bacon, MD Pager #: [phone]

## 2015-05-06 NOTE — Discharge Instructions (Signed)
Implanted Port Insertion, Care After Refer to this sheet in the next few weeks. These instructions provide you with information on caring for yourself after your procedure. Your health care provider may also give you more specific instructions. Your treatment has been planned according to current medical practices, but problems sometimes occur. Call your health care provider if you have any problems or questions after your procedure. WHAT TO EXPECT AFTER THE PROCEDURE After your procedure, it is typical to have the following:   Discomfort at the port insertion site. Ice packs to the area will help.  Bruising on the skin over the port. This will subside in 3-4 days. HOME CARE INSTRUCTIONS  After your port is placed, you will get a manufacturer's information card. The card has information about your port. Keep this card with you at all times.   Know what kind of port you have. There are many types of ports available.   Wear a medical alert bracelet in case of an emergency. This can help alert health care workers that you have a port.   The port can stay in for as long as your health care provider believes it is necessary.   A home health care nurse may give medicines and take care of the port.   You or a family member can get special training and directions for giving medicine and taking care of the port at home.  SEEK MEDICAL CARE IF:   Your port does not flush or you are unable to get a blood return.   You have a fever or chills. SEEK IMMEDIATE MEDICAL CARE IF:  You have new fluid or pus coming from your incision.   You notice a bad smell coming from your incision site.   You have swelling, pain, or more redness at the incision or port site.   You have chest pain or shortness of breath.   This information is not intended to replace advice given to you by your health care provider. Make sure you discuss any questions you have with your health care provider.   Document  Released: 01/17/2013 Document Revised: 04/03/2013 Document Reviewed: 01/17/2013 Elsevier Interactive Patient Education 2016 [place] An implanted port is a type of central line that is placed under the skin. Central lines are used to provide IV access when treatment or nutrition needs to be given through a person's veins. Implanted ports are used for long-term IV access. An implanted port may be placed because:   You need IV medicine that would be irritating to the small veins in your hands or arms.   You need long-term IV medicines, such as antibiotics.   You need IV nutrition for a long period.   You need frequent blood draws for lab tests.   You need dialysis.  Implanted ports are usually placed in the chest area, but they can also be placed in the upper arm, the abdomen, or the leg. An implanted port has two main parts:   Reservoir. The reservoir is round and will appear as a small, raised area under your skin. The reservoir is the part where a needle is inserted to give medicines or draw blood.   Catheter. The catheter is a thin, flexible tube that extends from the reservoir. The catheter is placed into a large vein. Medicine that is inserted into the reservoir goes into the catheter and then into the vein.  HOW WILL I CARE FOR MY INCISION SITE? Do not get the  incision site wet. Bathe or shower as directed by your health care provider.  HOW IS MY PORT ACCESSED? Special steps must be taken to access the port:   Before the port is accessed, a numbing cream can be placed on the skin. This helps numb the skin over the port site.   Your health care provider uses a sterile technique to access the port.  Your health care provider must put on a mask and sterile gloves.  The skin over your port is cleaned carefully with an antiseptic and allowed to dry.  The port is gently pinched between sterile gloves, and a needle is inserted into the  port.  Only "non-coring" port needles should be used to access the port. Once the port is accessed, a blood return should be checked. This helps ensure that the port is in the vein and is not clogged.   If your port needs to remain accessed for a constant infusion, a clear (transparent) bandage will be placed over the needle site. The bandage and needle will need to be changed every week, or as directed by your health care provider.   Keep the bandage covering the needle clean and dry. Do not get it wet. Follow your health care provider's instructions on how to take a shower or bath while the port is accessed.   If your port does not need to stay accessed, no bandage is needed over the port.  WHAT IS FLUSHING? Flushing helps keep the port from getting clogged. Follow your health care provider's instructions on how and when to flush the port. Ports are usually flushed with saline solution or a medicine called heparin. The need for flushing will depend on how the port is used.   If the port is used for intermittent medicines or blood draws, the port will need to be flushed:   After medicines have been given.   After blood has been drawn.   As part of routine maintenance.   If a constant infusion is running, the port may not need to be flushed.  HOW LONG WILL MY PORT STAY IMPLANTED? The port can stay in for as long as your health care provider thinks it is needed. When it is time for the port to come out, surgery will be done to remove it. The procedure is similar to the one performed when the port was put in.  WHEN SHOULD I SEEK IMMEDIATE MEDICAL CARE? When you have an implanted port, you should seek immediate medical care if:   You notice a bad smell coming from the incision site.   You have swelling, redness, or drainage at the incision site.   You have more swelling or pain at the port site or the surrounding area.   You have a fever that is not controlled with  medicine.   This information is not intended to replace advice given to you by your health care provider. Make sure you discuss any questions you have with your health care provider.   Document Released: 03/29/2005 Document Revised: 01/17/2013 Document Reviewed: 12/04/2012 Elsevier Interactive Patient Education 2016 Else[employer]   Moderate Conscious Sedation, Adult, Care After Refer to this sheet in the next few weeks. These instructions provide you with information on caring for yourself after your procedure. Your health care provider may also give you more specific instructions. Your treatment has been planned according to current medical practices, but problems sometimes occur. Call your health care provider if you have any problems or questions  after your procedure. WHAT TO EXPECT AFTER THE PROCEDURE  After your procedure:  You may feel sleepy, clumsy, and have poor balance for several hours.  Vomiting may occur if you eat too soon after the procedure. HOME CARE INSTRUCTIONS  Do not participate in any activities where you could become injured for at least 24 hours. Do not:  Drive.  Swim.  Ride a bicycle.  Operate heavy machinery.  Cook.  Use power tools.  Climb ladders.  Work from a high place.  Do not make important decisions or sign legal documents until you are improved.  If you vomit, drink water, juice, or soup when you can drink without vomiting. Make sure you have little or no nausea before eating solid foods.  Only take over-the-counter or prescription medicines for pain, discomfort, or fever as directed by your health care provider.  Make sure you and your family fully understand everything about the medicines given to you, including what side effects may occur.  You should not drink alcohol, take sleeping pills, or take medicines that cause drowsiness for at least 24 hours.  If you smoke, do not smoke without supervision.  If you are feeling better, you  may resume normal activities 24 hours after you were sedated.  Keep all appointments with your health care provider. SEEK MEDICAL CARE IF:  Your skin is pale or bluish in color.  You continue to feel nauseous or vomit.  Your pain is getting worse and is not helped by medicine.  You have bleeding or swelling.  You are still sleepy or feeling clumsy after 24 hours. SEEK IMMEDIATE MEDICAL CARE IF:  You develop a rash.  You have difficulty breathing.  You develop any type of allergic problem.  You have a fever. MAKE SURE YOU:  Understand these instructions.  Will watch your condition.  Will get help right away if you are not doing well or get worse.   This information is not intended to replace advice given to you by your health care provider. Make sure you discuss any questions you have with your health care provider.   Document Released: 01/17/2013 Document Revised: 04/19/2014 Document Reviewed: 01/17/2013 Elsevier Interactive Patient Education [employer].

## 2015-05-07 ENCOUNTER — Other Ambulatory Visit: Payer: Self-pay | Admitting: Oncology

## 2015-05-08 ENCOUNTER — Ambulatory Visit (HOSPITAL_BASED_OUTPATIENT_CLINIC_OR_DEPARTMENT_OTHER): Payer: Medicare HMO | Admitting: Oncology

## 2015-05-08 ENCOUNTER — Encounter: Payer: Self-pay | Admitting: Oncology

## 2015-05-08 ENCOUNTER — Telehealth: Payer: Self-pay | Admitting: Oncology

## 2015-05-08 ENCOUNTER — Other Ambulatory Visit (HOSPITAL_BASED_OUTPATIENT_CLINIC_OR_DEPARTMENT_OTHER): Payer: Medicare HMO

## 2015-05-08 VITALS — BP 111/47 | HR 80 | Temp 98.1°F | Resp 18 | Ht 62.75 in | Wt 295.8 lb

## 2015-05-08 DIAGNOSIS — L739 Follicular disorder, unspecified: Secondary | ICD-10-CM

## 2015-05-08 DIAGNOSIS — D6481 Anemia due to antineoplastic chemotherapy: Secondary | ICD-10-CM | POA: Diagnosis not present

## 2015-05-08 DIAGNOSIS — C569 Malignant neoplasm of unspecified ovary: Secondary | ICD-10-CM

## 2015-05-08 DIAGNOSIS — D61810 Antineoplastic chemotherapy induced pancytopenia: Secondary | ICD-10-CM

## 2015-05-08 DIAGNOSIS — D6959 Other secondary thrombocytopenia: Secondary | ICD-10-CM

## 2015-05-08 DIAGNOSIS — C562 Malignant neoplasm of left ovary: Secondary | ICD-10-CM

## 2015-05-08 DIAGNOSIS — D701 Agranulocytosis secondary to cancer chemotherapy: Secondary | ICD-10-CM

## 2015-05-08 DIAGNOSIS — T451X5A Adverse effect of antineoplastic and immunosuppressive drugs, initial encounter: Secondary | ICD-10-CM

## 2015-05-08 DIAGNOSIS — Z95828 Presence of other vascular implants and grafts: Secondary | ICD-10-CM

## 2015-05-08 DIAGNOSIS — D696 Thrombocytopenia, unspecified: Secondary | ICD-10-CM | POA: Diagnosis not present

## 2015-05-08 LAB — CBC WITH DIFFERENTIAL/PLATELET
BASO%: 0.3 % (ref 0.0–2.0)
Basophils Absolute: 0 10*3/uL (ref 0.0–0.1)
EOS%: 0 % (ref 0.0–7.0)
Eosinophils Absolute: 0 10*3/uL (ref 0.0–0.5)
HCT: 27 % — ABNORMAL LOW (ref 34.8–46.6)
HEMOGLOBIN: 8.8 g/dL — AB (ref 11.6–15.9)
LYMPH#: 1.1 10*3/uL (ref 0.9–3.3)
LYMPH%: 32.7 % (ref 14.0–49.7)
MCH: 27.8 pg (ref 25.1–34.0)
MCHC: 32.6 g/dL (ref 31.5–36.0)
MCV: 85.4 fL (ref 79.5–101.0)
MONO#: 0.3 10*3/uL (ref 0.1–0.9)
MONO%: 7.3 % (ref 0.0–14.0)
NEUT#: 2 10*3/uL (ref 1.5–6.5)
NEUT%: 59.7 % (ref 38.4–76.8)
Platelets: 40 10*3/uL — ABNORMAL LOW (ref 145–400)
RBC: 3.16 10*6/uL — AB (ref 3.70–5.45)
RDW: 15.1 % — ABNORMAL HIGH (ref 11.2–14.5)
WBC: 3.4 10*3/uL — ABNORMAL LOW (ref 3.9–10.3)
nRBC: 0 % (ref 0–0)

## 2015-05-08 LAB — COMPREHENSIVE METABOLIC PANEL
ALT: 19 U/L (ref 0–55)
AST: 20 U/L (ref 5–34)
Albumin: 3.3 g/dL — ABNORMAL LOW (ref 3.5–5.0)
Alkaline Phosphatase: 70 U/L (ref 40–150)
Anion Gap: 9 mEq/L (ref 3–11)
BUN: 20.3 mg/dL (ref 7.0–26.0)
CHLORIDE: 106 meq/L (ref 98–109)
CO2: 26 mEq/L (ref 22–29)
Calcium: 9.3 mg/dL (ref 8.4–10.4)
Creatinine: 0.9 mg/dL (ref 0.6–1.1)
EGFR: 73 mL/min/{1.73_m2} — ABNORMAL LOW (ref >90)
GLUCOSE: 106 mg/dL (ref 70–140)
POTASSIUM: 3.7 meq/L (ref 3.5–5.1)
SODIUM: 141 meq/L (ref 136–145)
Total Bilirubin: 0.64 mg/dL (ref 0.20–1.20)
Total Protein: 7.6 g/dL (ref 6.4–8.3)

## 2015-05-08 NOTE — Telephone Encounter (Signed)
Appointments made and avs printed for patient °

## 2015-05-08 NOTE — Progress Notes (Signed)
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 She had day 1 cycle 2 on 04-17-15 with granix x2, then missed day 8  and day 15 cycle 2. She was neutropenic with ANC 0.7 on 04-25-15.  She had PAC by IR on 05-06-15. SHe had  PRBC on 05-01-15 for hemoglobin down to 7.7. She is anemic and thrombocytopenic today.  Patient has bruising around new PAC, but denies any overt bleeding; there is irritation from dressing/ tape above the PAC laterally. The skin irritation and blister at coccyx have essentially resolved and folliculitis on scalp is some better after Rocephin x 2 last week, tho she did not resume Keflex subsequently. Diarrhea is improved, with 2 small stools yesterday, still loose and dark. She has had no vomiting. H2 blocker previously was bid OTC ranitidine, this changed to pepcid ~ 2 weeks ago for allergy concerns but fine to resume usual ranitidine now. She has slight swelling in LE, which she does not elevate. She denies abdominal or pelvic pain. She has no significant fever and no chills. She denies persistent SOB, cough or chest pain, tho does have some intermittent symptoms suggesting her chronic asthma and I have asked her to use her prn asthma medications. She is voiding.  Remainder of 10 point Review of Systems negative/ unchanged.  PAC placed by IR 05-06-15 Refused flu vaccine. No genetics testing CA 125 was 10 preop  ONCOLOGIC HISTORY Patient has been followed closely by Dr Corinna Capra of gyn, including hysteroscopy D & C 08-2013 for bleeding, with benign endometrial polyp then ([id number]). She had CT at outside facility [id number] because of umbilical hernia, which showed 6 cm left  adnexal mass with imaging consistent with ovarian cyst. She had follow up imaging in 12-2014 with cyst 6.9 cm, not complex, and CA 125 normal at 10 on 12-18-14. By repeat US 01-23-15 the area measured 9.7 x 7.0 x 6.4 cm with normal flow to ovary, no flow to cyst and no free fluid, endometrial stripe 3.9 mm; patient was then having some pelvic pressure. She had abdominal hysterectomy BSO by Dr Corinna Capra on 02-11-15, with operative findings of adhesions from left adnexal mass to bowel and pelvic sidewall, no ascites, normal appearing omentum, normal appendix; there was unavoidable intraoperative rupture during surgery. Pathology [phone]) left clear cell ovarian carcinoma at least 7 cy with capsule rupture, benign left tube, right ovary and tube and uterus. Cytology of washings negative for malignancy ([id number]). Post operative course has been uncomplicated. CT CAP 03-07-15 showed no apparent metastatic disease, with hepatic steatosis, abdominal aortic atherosclerosis and some fluid at paraumbilical hernia. Patient saw Dr Denman George in consultation on 02-24-15, with recommendation for 6 cycles carboplatin taxol adjuvantly. She had day 1 cycle 1 dose dense carbo taxol on12-14-16. Day 8 cycle 1 held with ANC 1.3, granix 480 added day after each treatment. L9J6 and [id number] also delayed with neutropenia and other issues.    Objective:  Vital signs in last 24 hours:  BP 111/47 mmHg  Pulse 80  Temp(Src) 98.1 F (36.7 C) (Oral)  Resp 18  Ht 5' 2.75" (1.594 m)  Wt 295 lb 12.8 oz (134.174 kg)  BMI 52.81 kg/m2  SpO2 97% Weight down 3 lbs  Alert, oriented and appropriate. Ambulatory with cane. Respirations not labored RA with exertion in exam room. Talkative, pleasant and cooperative, does not appear in acute discomfort Alopecia  HEENT:PERRL, sclerae not icteric. Oral mucosa somewhat pale,  moist without lesions, posterior pharynx clear.  Neck supple. No JVD.  Lymphatics:no cervical,supraclavicular adenopathy Resp:  clear to auscultation bilaterally and normal percussion bilaterally, no wheezes. Cardio: regular rate and rhythm. No gallop. GI: abdomen obese, soft, nontender, not distended, no appreciable mass or organomegaly. SOft umbilical hernia unchanged. Normally active bowel sounds. Surgical incision not remarkable. Musculoskeletal/ Extremities: trace pedal edema bilaterally without cords, tenderness Neuro: no increased peripheral neuropathy. Otherwise nonfocal. Psych appropriate mood and affect Skin without rash, petechiae. Resolving ecchymosis mostly inferior to PAC area, no oozing from incisions, which are covered with steristrips. Superior and lateral to [place] is skin irritation with blister 0.5 cm. Folliculitis on scalp more dry than previously, still extensive, less tender. Area at top of intergluteal crease no longer any erythema or blister, not tender Portacath position seems good.  Lab Results:  Results for orders placed or performed in visit on 05/08/15  CBC with Differential  Result Value Ref Range   WBC 3.4 (L) 3.9 - 10.3 10e3/uL   NEUT# 2.0 1.5 - 6.5 10e3/uL   HGB 8.8 (L) 11.6 - 15.9 g/dL   HCT 27.0 (L) 34.8 - 46.6 %   Platelets 40 (L) 145 - 400 10e3/uL   MCV 85.4 79.5 - 101.0 fL   MCH 27.8 25.1 - 34.0 pg   MCHC 32.6 31.5 - 36.0 g/dL   RBC 3.16 (L) 3.70 - 5.45 10e6/uL   RDW 15.1 (H) 11.2 - 14.5 %   lymph# 1.1 0.9 - 3.3 10e3/uL   MONO# 0.3 0.1 - 0.9 10e3/uL   Eosinophils Absolute 0.0 0.0 - 0.5 10e3/uL   Basophils Absolute 0.0 0.0 - 0.1 10e3/uL   NEUT% 59.7 38.4 - 76.8 %   LYMPH% 32.7 14.0 - 49.7 %   MONO% 7.3 0.0 - 14.0 %   EOS% 0.0 0.0 - 7.0 %   BASO% 0.3 0.0 - 2.0 %   nRBC 0 0 - 0 %  Comprehensive metabolic panel  Result Value Ref Range   Sodium 141 136 - 145 mEq/L   Potassium 3.7 3.5 - 5.1 mEq/L   Chloride 106 98 - 109 mEq/L   CO2 26 22 - 29 mEq/L   Glucose 106 70 - 140 mg/dl   BUN 20.3 7.0 - 26.0 mg/dL   Creatinine 0.9 0.6 - 1.1 mg/dL   Total Bilirubin 0.64 0.20 -  1.20 mg/dL   Alkaline Phosphatase 70 40 - 150 U/L   AST 20 5 - 34 U/L   ALT 19 0 - 55 U/L   Total Protein 7.6 6.4 - 8.3 g/dL   Albumin 3.3 (L) 3.5 - 5.0 g/dL   Calcium 9.3 8.4 - 10.4 mg/dL   Anion Gap 9 3 - 11 mEq/L   EGFR 73 (L) >90 ml/min/1.73 m2    Iron studies low normal 03-2015  Studies/Results:  Ir Fluoro Guide Cv Line Right  05/06/2015  INDICATION: History of ovarian cancer. In need of durable intravenous access for chemotherapy administration. EXAM: IMPLANTED PORT A CATH PLACEMENT WITH ULTRASOUND AND FLUOROSCOPIC GUIDANCE COMPARISON:  Chest CT - 03/07/2015 MEDICATIONS: Vancomycin 1.5 IV; The antibiotic was administered within an appropriate time interval prior to skin puncture. ANESTHESIA/SEDATION: Versed 4 mg IV; Fentanyl 100 mcg IV; Total Moderate Sedation Time 23 minutes The patient was continuously monitored during  the procedure by the interventional radiology nurse under my direct supervision. CONTRAST:  None FLUOROSCOPY TIME:  30 seconds (23 mGy) COMPLICATIONS: None immediate. PROCEDURE: The procedure, risks, benefits, and alternatives were explained to the patient. Questions regarding the procedure were encouraged and answered. The patient understands and consents to the procedure. The right neck and chest were prepped with chlorhexidine in a sterile fashion, and a sterile drape was applied covering the operative field. Maximum barrier sterile technique with sterile gowns and gloves were used for the procedure. A timeout was performed prior to the initiation of the procedure. Local anesthesia was provided with 1% lidocaine with epinephrine. After creating a small venotomy incision, a micropuncture kit was utilized to access the internal jugular vein under direct, real-time ultrasound guidance. Ultrasound image documentation was performed. The microwire was kinked to measure appropriate catheter length. A subcutaneous port pocket was then created along the upper chest wall utilizing a  combination of sharp and blunt dissection. The pocket was irrigated with sterile saline. A single lumen power injectable port was chosen for placement. The 8 Fr catheter was tunneled from the port pocket site to the venotomy incision. The port was placed in the pocket. The external catheter was trimmed to appropriate length. At the venotomy, an 8 Fr peel-away sheath was placed over a guidewire under fluoroscopic guidance. The catheter was then placed through the sheath and the sheath was removed. Final catheter positioning was confirmed and documented with a fluoroscopic spot radiograph. The port was accessed with a Huber needle, aspirated and flushed with heparinized saline. The venotomy site was closed with an interrupted 4-0 Vicryl suture. The port pocket incision was closed with interrupted 2-0 Vicryl suture and the skin was opposed with a running subcuticular 4-0 Vicryl suture. Dermabond and Steri-strips were applied to both incisions. Dressings were placed. The patient tolerated the procedure well without immediate post procedural complication. FINDINGS: After catheter placement, the tip lies within the superior cavoatrial junction. The catheter aspirates and flushes normally and is ready for immediate use. IMPRESSION: Successful placement of a right internal jugular approach power injectable Port-A-Cath. The catheter is ready for immediate use. Electronically Signed   By: Sandi Mariscal M.D.   On: 05/06/2015 14:35   Ir US Guide Vasc Access Right  05/06/2015  INDICATION: History of ovarian cancer. In need of durable intravenous access for chemotherapy administration. EXAM: IMPLANTED PORT A CATH PLACEMENT WITH ULTRASOUND AND FLUOROSCOPIC GUIDANCE COMPARISON:  Chest CT - 03/07/2015 MEDICATIONS: Vancomycin 1.5 IV; The antibiotic was administered within an appropriate time interval prior to skin puncture. ANESTHESIA/SEDATION: Versed 4 mg IV; Fentanyl 100 mcg IV; Total Moderate Sedation Time 23 minutes The patient  was continuously monitored during the procedure by the interventional radiology nurse under my direct supervision. CONTRAST:  None FLUOROSCOPY TIME:  30 seconds (23 mGy) COMPLICATIONS: None immediate. PROCEDURE: The procedure, risks, benefits, and alternatives were explained to the patient. Questions regarding the procedure were encouraged and answered. The patient understands and consents to the procedure. The right neck and chest were prepped with chlorhexidine in a sterile fashion, and a sterile drape was applied covering the operative field. Maximum barrier sterile technique with sterile gowns and gloves were used for the procedure. A timeout was performed prior to the initiation of the procedure. Local anesthesia was provided with 1% lidocaine with epinephrine. After creating a small venotomy incision, a micropuncture kit was utilized to access the internal jugular vein under direct, real-time ultrasound guidance. Ultrasound image documentation was performed. The microwire  was kinked to measure appropriate catheter length. A subcutaneous port pocket was then created along the upper chest wall utilizing a combination of sharp and blunt dissection. The pocket was irrigated with sterile saline. A single lumen power injectable port was chosen for placement. The 8 Fr catheter was tunneled from the port pocket site to the venotomy incision. The port was placed in the pocket. The external catheter was trimmed to appropriate length. At the venotomy, an 8 Fr peel-away sheath was placed over a guidewire under fluoroscopic guidance. The catheter was then placed through the sheath and the sheath was removed. Final catheter positioning was confirmed and documented with a fluoroscopic spot radiograph. The port was accessed with a Huber needle, aspirated and flushed with heparinized saline. The venotomy site was closed with an interrupted 4-0 Vicryl suture. The port pocket incision was closed with interrupted 2-0 Vicryl  suture and the skin was opposed with a running subcuticular 4-0 Vicryl suture. Dermabond and Steri-strips were applied to both incisions. Dressings were placed. The patient tolerated the procedure well without immediate post procedural complication. FINDINGS: After catheter placement, the tip lies within the superior cavoatrial junction. The catheter aspirates and flushes normally and is ready for immediate use. IMPRESSION: Successful placement of a right internal jugular approach power injectable Port-A-Cath. The catheter is ready for immediate use. Electronically Signed   By: Sandi Mariscal M.D.   On: 05/06/2015 14:35    Medications: I have reviewed the patient's current medications. She is instructed not to use ASA or NSAID while platelets are low.   DISCUSSION Multiple issues discussed. Hold chemo until platelets have recovered, will need decrease in carboplatin dose and additional gCSF for next cycle. She is to call if any significant bleeding.   Assessment/Plan:  1. Clear cell carcinoma of ovary clinically IC: found incidentally on CT done for unrelated umbilical hernia, normal CA 125, post abdominal hysterectomy BSO by Dr Corinna Capra 02-11-15. Adjuvant carboplatin taxol begun 03-26-15 using dose dense regimen, delays for chemo neutropenia despite gCSF used thus far, and for thrombocytopenia; also required PRBCs on 05-01-15.. Will need to used gCSF ongoing with chemo. I will see her 05-15-15 and will treat with cycle 3 chemo that day if counts allow. 2 area of cellulitis or pressure at coccyx resolved and folliculitis scalp improved after Rocephin x2. Continue baby shampoo and topical antibiotic for scalp 3. Progressive anemia: seems chemo related, iron studies low/ low normal in early 04/08/23, patient has not been willing to start oral iron. Not markedly symptomatic today, follow. 4.Low temperature elevations seem improved, etiology not clear but all cultures negative.  5.Poor peripheral IV access, PAC  rescheduled x 2 before recent placement 6.history of crohn's disease/ ileitis, which has not required intervention in past 10 years. Known to [facility] 7.history of melanoma right posterior thigh 2003 with 2 sentinel nodes negative. Followed by dermatology 8.umbilical hernia since 9381, stable 9.multiple drug intolerances. EMR notes is able to take Rocephin and Keflex 10.HTN x ~ 10 years followed now by PCP 11.GERD longstanding and unchanged 12.hepatic steatosis by CT 13. Thoracic and abdominal aortic atherosclerosis by CT 14. History of chronic anxiety and panic attacks 15.morbid obesity, BMI >50 16.multiple drug and antibiotic intolerances. 17refuses flu vaccine: discussed Tamiflu, either to have available or prescription at onset of symptoms or if known exposure. 18.asthma: on regular treatment, exacerbations generally with respiratory illnesses. Has had wheezing after ASA, which is now listed on allergies. 19.mammograms due at [facility], but will wait until present  problems are better managed or chemo completed for these.  All questions answered and she knows to call if concerns before next scheduled appointment.  Cc Dr Ilene Qua Time spent 25 min including >50% counseling and coordination of care.    Ramia Sidney P, MD   05/08/2015, 12:09 PM

## 2015-05-09 ENCOUNTER — Ambulatory Visit: Payer: Medicare HMO

## 2015-05-10 ENCOUNTER — Ambulatory Visit: Payer: Medicare HMO

## 2015-05-11 DIAGNOSIS — Z95828 Presence of other vascular implants and grafts: Secondary | ICD-10-CM | POA: Insufficient documentation

## 2015-05-11 DIAGNOSIS — T451X5A Adverse effect of antineoplastic and immunosuppressive drugs, initial encounter: Secondary | ICD-10-CM

## 2015-05-11 DIAGNOSIS — D6959 Other secondary thrombocytopenia: Secondary | ICD-10-CM | POA: Insufficient documentation

## 2015-05-13 ENCOUNTER — Encounter: Payer: Self-pay | Admitting: *Deleted

## 2015-05-13 NOTE — Progress Notes (Signed)
[facility] Psychosocial Distress Screening Clinical Social Work  Clinical Social Work was referred by distress screening protocol.  The patient scored a 7 on the Psychosocial Distress Thermometer which indicates moderate distress. Clinical Social Worker attempted to contact patient by phone to assess for distress and other psychosocial needs. CSW left voicemail requesting return call when convenient.  ONCBCN DISTRESS SCREENING 03/31/2015  Screening Type Initial Screening  Distress experienced in past week (1-10) 7  Practical problem type Food  Emotional problem type Nervousness/Anxiety;Adjusting to illness  Spiritual/Religous concerns type (No Data)  Information Concerns Type Lack of info about maintaining fitness  Physical Problem type Pain;Sleep/insomnia;Bathing/dressing;Loss of appetitie;Constipation/diarrhea  Physician notified of physical symptoms Yes   Polo Riley, MSW, LCSW, OSW-C Clinical Social Worker [facility] [phone]

## 2015-05-14 ENCOUNTER — Other Ambulatory Visit: Payer: Self-pay | Admitting: Oncology

## 2015-05-15 ENCOUNTER — Telehealth: Payer: Self-pay | Admitting: Oncology

## 2015-05-15 ENCOUNTER — Ambulatory Visit (HOSPITAL_BASED_OUTPATIENT_CLINIC_OR_DEPARTMENT_OTHER): Payer: Medicare HMO

## 2015-05-15 ENCOUNTER — Other Ambulatory Visit (HOSPITAL_BASED_OUTPATIENT_CLINIC_OR_DEPARTMENT_OTHER): Payer: Medicare HMO

## 2015-05-15 ENCOUNTER — Ambulatory Visit (HOSPITAL_BASED_OUTPATIENT_CLINIC_OR_DEPARTMENT_OTHER): Payer: Medicare HMO | Admitting: Oncology

## 2015-05-15 ENCOUNTER — Encounter: Payer: Self-pay | Admitting: Oncology

## 2015-05-15 VITALS — BP 148/61 | HR 98 | Temp 97.4°F | Resp 18 | Ht 62.75 in | Wt 283.2 lb

## 2015-05-15 DIAGNOSIS — D6481 Anemia due to antineoplastic chemotherapy: Secondary | ICD-10-CM | POA: Diagnosis not present

## 2015-05-15 DIAGNOSIS — D649 Anemia, unspecified: Secondary | ICD-10-CM

## 2015-05-15 DIAGNOSIS — D6959 Other secondary thrombocytopenia: Secondary | ICD-10-CM

## 2015-05-15 DIAGNOSIS — D701 Agranulocytosis secondary to cancer chemotherapy: Secondary | ICD-10-CM | POA: Diagnosis not present

## 2015-05-15 DIAGNOSIS — T451X5A Adverse effect of antineoplastic and immunosuppressive drugs, initial encounter: Secondary | ICD-10-CM

## 2015-05-15 DIAGNOSIS — Z5111 Encounter for antineoplastic chemotherapy: Secondary | ICD-10-CM | POA: Diagnosis not present

## 2015-05-15 DIAGNOSIS — C562 Malignant neoplasm of left ovary: Secondary | ICD-10-CM

## 2015-05-15 DIAGNOSIS — Z95828 Presence of other vascular implants and grafts: Secondary | ICD-10-CM

## 2015-05-15 LAB — CBC WITH DIFFERENTIAL/PLATELET
BASO%: 0.2 % (ref 0.0–2.0)
BASOS ABS: 0 10*3/uL (ref 0.0–0.1)
EOS%: 0 % (ref 0.0–7.0)
Eosinophils Absolute: 0 10*3/uL (ref 0.0–0.5)
HCT: 30.1 % — ABNORMAL LOW (ref 34.8–46.6)
HGB: 10 g/dL — ABNORMAL LOW (ref 11.6–15.9)
LYMPH%: 17.9 % (ref 14.0–49.7)
MCH: 28.3 pg (ref 25.1–34.0)
MCHC: 33.2 g/dL (ref 31.5–36.0)
MCV: 85.1 fL (ref 79.5–101.0)
MONO#: 0 10*3/uL — ABNORMAL LOW (ref 0.1–0.9)
MONO%: 1.1 % (ref 0.0–14.0)
NEUT#: 2 10*3/uL (ref 1.5–6.5)
NEUT%: 80.8 % — AB (ref 38.4–76.8)
Platelets: 199 10*3/uL (ref 145–400)
RBC: 3.54 10*6/uL — AB (ref 3.70–5.45)
RDW: 14.8 % — AB (ref 11.2–14.5)
WBC: 2.5 10*3/uL — ABNORMAL LOW (ref 3.9–10.3)
lymph#: 0.4 10*3/uL — ABNORMAL LOW (ref 0.9–3.3)

## 2015-05-15 LAB — COMPREHENSIVE METABOLIC PANEL
ALT: 23 U/L (ref 0–55)
AST: 20 U/L (ref 5–34)
Albumin: 3.7 g/dL (ref 3.5–5.0)
Alkaline Phosphatase: 77 U/L (ref 40–150)
Anion Gap: 14 mEq/L — ABNORMAL HIGH (ref 3–11)
BUN: 23.4 mg/dL (ref 7.0–26.0)
CHLORIDE: 102 meq/L (ref 98–109)
CO2: 20 mEq/L — ABNORMAL LOW (ref 22–29)
Calcium: 9.6 mg/dL (ref 8.4–10.4)
Creatinine: 1 mg/dL (ref 0.6–1.1)
EGFR: 61 mL/min/{1.73_m2} — ABNORMAL LOW (ref >90)
GLUCOSE: 231 mg/dL — AB (ref 70–140)
POTASSIUM: 4.1 meq/L (ref 3.5–5.1)
SODIUM: 137 meq/L (ref 136–145)
Total Bilirubin: 0.56 mg/dL (ref 0.20–1.20)
Total Protein: 8.9 g/dL — ABNORMAL HIGH (ref 6.4–8.3)

## 2015-05-15 LAB — FECAL OCCULT BLOOD, GUAIAC: OCCULT BLOOD: NEGATIVE

## 2015-05-15 MED ORDER — LORAZEPAM 1 MG PO TABS
ORAL_TABLET | ORAL | Status: AC
  Filled 2015-05-15: qty 1

## 2015-05-15 MED ORDER — PROCHLORPERAZINE EDISYLATE 5 MG/ML IJ SOLN
10.0000 mg | Freq: Once | INTRAMUSCULAR | Status: AC
  Administered 2015-05-15: 10 mg via INTRAVENOUS

## 2015-05-15 MED ORDER — PACLITAXEL CHEMO INJECTION 300 MG/50ML
80.0000 mg/m2 | Freq: Once | INTRAVENOUS | Status: AC
  Administered 2015-05-15: 192 mg via INTRAVENOUS
  Filled 2015-05-15: qty 32

## 2015-05-15 MED ORDER — DEXAMETHASONE SODIUM PHOSPHATE 100 MG/10ML IJ SOLN
20.0000 mg | Freq: Once | INTRAMUSCULAR | Status: AC
  Administered 2015-05-15: 20 mg via INTRAVENOUS
  Filled 2015-05-15: qty 2

## 2015-05-15 MED ORDER — FAMOTIDINE IN NACL 20-0.9 MG/50ML-% IV SOLN
20.0000 mg | Freq: Once | INTRAVENOUS | Status: AC
  Administered 2015-05-15: 20 mg via INTRAVENOUS

## 2015-05-15 MED ORDER — DIPHENHYDRAMINE HCL 50 MG/ML IJ SOLN
INTRAMUSCULAR | Status: AC
  Filled 2015-05-15: qty 1

## 2015-05-15 MED ORDER — DIPHENHYDRAMINE HCL 50 MG/ML IJ SOLN
25.0000 mg | Freq: Once | INTRAMUSCULAR | Status: AC
  Administered 2015-05-15: 25 mg via INTRAVENOUS

## 2015-05-15 MED ORDER — SODIUM CHLORIDE 0.9 % IV SOLN
Freq: Once | INTRAVENOUS | Status: AC
  Administered 2015-05-15: 11:00:00 via INTRAVENOUS

## 2015-05-15 MED ORDER — PROCHLORPERAZINE EDISYLATE 5 MG/ML IJ SOLN
INTRAMUSCULAR | Status: AC
  Filled 2015-05-15: qty 2

## 2015-05-15 MED ORDER — LORAZEPAM 1 MG PO TABS
1.0000 mg | ORAL_TABLET | Freq: Once | ORAL | Status: AC | PRN
  Administered 2015-05-15: 1 mg via ORAL

## 2015-05-15 MED ORDER — FAMOTIDINE IN NACL 20-0.9 MG/50ML-% IV SOLN
INTRAVENOUS | Status: AC
  Filled 2015-05-15: qty 50

## 2015-05-15 NOTE — Telephone Encounter (Signed)
Appointments made and avs printed for patient °

## 2015-05-15 NOTE — Patient Instructions (Signed)
[facility] Discharge Instructions for Patients Receiving Chemotherapy  Today you received the following chemotherapy agents Taxol.  To help prevent nausea and vomiting after your treatment, we encourage you to take your nausea medication as prescribed.   If you develop nausea and vomiting that is not controlled by your nausea medication, call the clinic.   BELOW ARE SYMPTOMS THAT SHOULD BE REPORTED IMMEDIATELY:  *FEVER GREATER THAN 100.5 F  *CHILLS WITH OR WITHOUT FEVER  NAUSEA AND VOMITING THAT IS NOT CONTROLLED WITH YOUR NAUSEA MEDICATION  *UNUSUAL SHORTNESS OF BREATH  *UNUSUAL BRUISING OR BLEEDING  TENDERNESS IN MOUTH AND THROAT WITH OR WITHOUT PRESENCE OF ULCERS  *URINARY PROBLEMS  *BOWEL PROBLEMS  UNUSUAL RASH Items with * indicate a potential emergency and should be followed up as soon as possible.  Feel free to call the clinic you have any questions or concerns. The clinic phone number is [phone].  Please show the [place] at check-in to the Emergency Department and triage nurse.  Take Ativan and not Xanax. If you need to take the Xanax make sure it is at least 4 hours apart from taking the Ativan. Be sure to take Zofran in the morning.

## 2015-05-15 NOTE — Progress Notes (Signed)
Spoke w/ pt's husband regarding assistance.  I informed him of the [facility] and [place] so he will bring their proof of income on 05/16/15 to see if she qualifies.  He also completed an application for [employer] which I faxed today.  He has Shauna's card for any questions or concerns they may have in the future.

## 2015-05-15 NOTE — Progress Notes (Signed)
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 She has had a difficult time with chemo neutropenia, chemo anemia, chemo thrombocytopenia and other problems, but counts today ok for delayed day 8 cycle 2, and PAC now in.   Areas of skin irritation scalp much improved and buttocks area resolved. PAC is just a little uncomfortable from recent placement, improving. She and husband both had a few loose stools after eating purchased tacos last week, bowels now formed, no bleeding (Crohn's diagnosis). Appetite generally good. No fever or symptoms of infection. Has needed additional inhaler a few times, breathing today ok. No bleeding. No increased swelling LE.Marland Kitchen No abdominal or pelvic pain. Bladder ok. Patient feels she is not retaining fluid now, is drinking fluids well by report.  Remainder of 10 point Review of Systems unchanged/ negative.    PAC placed by IR 05-06-15 Refused flu vaccine. No genetics testing CA 125 was 10 preop   ONCOLOGIC HISTORY Patient has been followed closely by Dr Corinna Capra of gyn, including hysteroscopy D & C 08-2013 for bleeding, with benign endometrial polyp then ([id number]). She had CT at outside facility 07-6268 because of umbilical hernia, which showed 6 cm left adnexal mass with imaging consistent with ovarian cyst. She had follow up imaging in 12-2014 with cyst 6.9 cm, not complex, and CA 125 normal at 10 on 12-18-14. By repeat US 01-23-15 the area measured 9.7 x 7.0 x 6.4 cm with normal flow to ovary, no flow to cyst and no free fluid, endometrial stripe 3.9 mm; patient was then having some pelvic pressure. She had abdominal hysterectomy BSO by Dr Corinna Capra on 02-11-15, with operative findings of  adhesions from left adnexal mass to bowel and pelvic sidewall, no ascites, normal appearing omentum, normal appendix; there was unavoidable intraoperative rupture during surgery. Pathology [phone]) left clear cell ovarian carcinoma at least 7 cy with capsule rupture, benign left tube, right ovary and tube and uterus. Cytology of washings negative for malignancy ([id number]). Post operative course has been uncomplicated. CT CAP 03-07-15 showed no apparent metastatic disease, with hepatic steatosis, abdominal aortic atherosclerosis and some fluid at paraumbilical hernia. Patient saw Dr Denman George in consultation on 02-24-15, with recommendation for 6 cycles carboplatin taxol adjuvantly. She had day 1 cycle 1 dose dense carbo taxol on12-14-16. Day 8 cycle 1 held with ANC 1.3, granix 480 added day after each treatment. D8C2 delayed with neutropenia and other issues, day 8 cycle 2 given 05-15-15..   Objective:  Vital signs in last 24 hours:  BP 148/61 mmHg  Pulse 98  Temp(Src) 97.4 F (36.3 C) (Oral)  Resp 18  Ht 5' 2.75" (1.594 m)  Wt 283 lb 3.2 oz (128.459 kg)  BMI 50.56 kg/m2  SpO2 97%  weight down from 298 on 05-06-15 Alert, oriented and appropriate. Ambulatory without assistance.  Alopecia  HEENT:PERRL, sclerae not icteric. Oral mucosa moist without lesions, posterior pharynx clear. Folliculitis on scalp drying, much improved, some crusting left lateral, no drainage and no significant erythema now. Neck supple. No JVD.  Lymphatics:no cervical,supraclavicular adenopathy Resp: clear to auscultation bilaterally and normal percussion bilaterally Cardio: regular rate and rhythm. No gallop. GI: abdomen obese, soft, nontender, not distended, no appreciable mass or organomegaly. Normal bowel sounds. Surgical incision  not remarkable. Musculoskeletal/ Extremities: without pitting edema, cords, tenderness Neuro: no peripheral neuropathy. Otherwise nonfocal Skin without rash on upper trunk, no  petechiae Portacath- good position, resolving ecchymoses  Lab Results:  Results for orders placed or performed in visit on 05/15/15  CBC with Differential  Result Value Ref Range   WBC 2.5 (L) 3.9 - 10.3 10e3/uL   NEUT# 2.0 1.5 - 6.5 10e3/uL   HGB 10.0 (L) 11.6 - 15.9 g/dL   HCT 30.1 (L) 34.8 - 46.6 %   Platelets 199 145 - 400 10e3/uL   MCV 85.1 79.5 - 101.0 fL   MCH 28.3 25.1 - 34.0 pg   MCHC 33.2 31.5 - 36.0 g/dL   RBC 3.54 (L) 3.70 - 5.45 10e6/uL   RDW 14.8 (H) 11.2 - 14.5 %   lymph# 0.4 (L) 0.9 - 3.3 10e3/uL   MONO# 0.0 (L) 0.1 - 0.9 10e3/uL   Eosinophils Absolute 0.0 0.0 - 0.5 10e3/uL   Basophils Absolute 0.0 0.0 - 0.1 10e3/uL   NEUT% 80.8 (H) 38.4 - 76.8 %   LYMPH% 17.9 14.0 - 49.7 %   MONO% 1.1 0.0 - 14.0 %   EOS% 0.0 0.0 - 7.0 %   BASO% 0.2 0.0 - 2.0 %  Comprehensive metabolic panel  Result Value Ref Range   Sodium 137 136 - 145 mEq/L   Potassium 4.1 3.5 - 5.1 mEq/L   Chloride 102 98 - 109 mEq/L   CO2 20 (L) 22 - 29 mEq/L   Glucose 231 (H) 70 - 140 mg/dl   BUN 23.4 7.0 - 26.0 mg/dL   Creatinine 1.0 0.6 - 1.1 mg/dL   Total Bilirubin 0.56 0.20 - 1.20 mg/dL   Alkaline Phosphatase 77 40 - 150 U/L   AST 20 5 - 34 U/L   ALT 23 0 - 55 U/L   Total Protein 8.9 (H) 6.4 - 8.3 g/dL   Albumin 3.7 3.5 - 5.0 g/dL   Calcium 9.6 8.4 - 10.4 mg/dL   Anion Gap 14 (H) 3 - 11 mEq/L   EGFR 61 (L) >90 ml/min/1.73 m2  Fecal Occult Blood  Result Value Ref Range   Occult Blood Negative x3      Studies/Results:  No results found.  Medications: I have reviewed the patient's current medications. Patient requests change in antiemetics due to possible constipation with zofran; chemo orders changed to compazine today; if tolerates better can also change subsequent orders.   DISCUSSION Interval history reviewed. Counts have improved to allow "day 8" cycle 2 today, tho she will need granix in 1-2 days after treatment, which I have emphasized to patient and husband now. Since so many  complications and delays thus far, I have decided to give remainder of cycle 2 (single taxol today and next week) prior to further dose reducing carbo for day 1 cycle 3.  Assessment/Plan:  1. Clear cell carcinoma of ovary clinically IC: found incidentally on CT done for unrelated umbilical hernia, normal CA 125, post abdominal hysterectomy BSO by Dr Corinna Capra 02-11-15. Adjuvant carboplatin taxol begun 03-26-15 using dose dense regimen, delays for chemo neutropenia despite gCSF used thus far, and for chemo thrombocytopenia; also required PRBCs on 05-01-15 for chemo anemia. Will need to used gCSF ongoing with chemo.  I will see her again with counts next week. Chemo dosing as above 2 area of cellulitis or pressure at coccyx resolved and folliculitis scalp improved after Rocephin x2. Continue baby shampoo and topical antibiotic for scalp 3. Progressive anemia: seems chemo  related, iron studies low/ low normal in early 2023-04-17, patient has started oral iron, follow. 4.Poor peripheral IV access, PAC rescheduled x 2 before recent placement 5.refuses flu vaccine: discussed Tamiflu, either to have available or prescription at onset of symptoms or if known exposure.5. 6.history of crohn's disease/ ileitis, which has not required intervention in past 10 years. Known to [facility]. Apparent food related gastroenteritis in last week, resolved. Change zofran to compazine today to see if this is better for GI function 7.history of melanoma right posterior thigh 2003 with 2 sentinel nodes negative. Followed by dermatology 8.umbilical hernia since 0034, stable 9.multiple drug intolerances. EMR notes is able to take Rocephin  10.HTN x ~ 10 years followed now by PCP 11.GERD longstanding and unchanged 12.hepatic steatosis by CT 13. Thoracic and abdominal aortic atherosclerosis by CT 14. History of chronic anxiety and panic attacks 15.morbid obesity, BMI >50 16.multiple drug and antibiotic intolerances. 17.asthma: on regular  treatment, exacerbations generally with respiratory illnesses. Has had wheezing after ASA, which is now listed on allergies. 18.mammograms due at [facility], but will wait until present problems are better managed or chemo completed for these.  All questions answered. Chemo and granix orders confirmed. She and husband know to call if needed before next scheduled visit. TIme spent 30 min including >50% counseling and coordination of care. CC Dr Becky Sax, MD   05/17/2015, 10:22 AM

## 2015-05-16 ENCOUNTER — Telehealth: Payer: Self-pay

## 2015-05-16 ENCOUNTER — Telehealth: Payer: Self-pay | Admitting: *Deleted

## 2015-05-16 ENCOUNTER — Ambulatory Visit (HOSPITAL_BASED_OUTPATIENT_CLINIC_OR_DEPARTMENT_OTHER): Payer: Medicare HMO

## 2015-05-16 ENCOUNTER — Other Ambulatory Visit: Payer: Self-pay | Admitting: Oncology

## 2015-05-16 ENCOUNTER — Ambulatory Visit: Payer: Medicare HMO

## 2015-05-16 ENCOUNTER — Encounter: Payer: Self-pay | Admitting: Oncology

## 2015-05-16 VITALS — BP 127/66 | HR 78 | Temp 98.2°F

## 2015-05-16 DIAGNOSIS — D701 Agranulocytosis secondary to cancer chemotherapy: Secondary | ICD-10-CM

## 2015-05-16 DIAGNOSIS — J45909 Unspecified asthma, uncomplicated: Secondary | ICD-10-CM

## 2015-05-16 DIAGNOSIS — C562 Malignant neoplasm of left ovary: Secondary | ICD-10-CM | POA: Diagnosis not present

## 2015-05-16 DIAGNOSIS — C569 Malignant neoplasm of unspecified ovary: Secondary | ICD-10-CM

## 2015-05-16 MED ORDER — TBO-FILGRASTIM 480 MCG/0.8ML ~~LOC~~ SOSY
480.0000 ug | PREFILLED_SYRINGE | Freq: Once | SUBCUTANEOUS | Status: AC
  Administered 2015-05-16: 480 ug via SUBCUTANEOUS
  Filled 2015-05-16: qty 0.8

## 2015-05-16 MED ORDER — BUDESONIDE-FORMOTEROL FUMARATE 160-4.5 MCG/ACT IN AERO: 2.0000 | INHALATION_SPRAY | Freq: Two times a day (BID) | RESPIRATORY_TRACT | Status: DC

## 2015-05-16 NOTE — Telephone Encounter (Signed)
Confirmed with injection nurse there is no time limit on neupogen and granix.  Future administrations can be next day

## 2015-05-16 NOTE — Telephone Encounter (Signed)
[name] received assistance funds for prescriptions at [place].  She is having a difficult time with bills.  She is requesting to see if Dr. Marko Plume would send in a prescription for her Symbicort to help with cost. Dr. Marko Plume approved prescription with a refill to [facility]. Prescription called in to [facility].

## 2015-05-16 NOTE — Progress Notes (Signed)
Pt is approved for the $400 [facility] and $400 [employer] grants.

## 2015-05-16 NOTE — Telephone Encounter (Signed)
Previously only the neulasta had the >24 hour time limit, not granix. Mrs. Meints gets granix. Please confirm the administration time for granix and let me know, as this may continue to be a question for her  thanks

## 2015-05-16 NOTE — Telephone Encounter (Signed)
FYI Call received from patient: "injection appointment was changed.  I live in [place] and do not have transporation tomorrow.  Yesterday, Dr. Marko Plume said she would see me tomorrow and I know I am supposed to come in today as I normally do." Insurance mandates time for injection must be 24.01 hours after chemotherapy has changed this process.  Observed disconnect was at 1245 pm with discharge at 1249.  Could be scheduled after 12:50 pm today.  P.O.F. Generated for a 2:15 pm injection.  She is aware of time and will be here unless scheduling must change time.  Marland Kitchen

## 2015-05-17 ENCOUNTER — Ambulatory Visit: Payer: Medicare HMO

## 2015-05-17 ENCOUNTER — Other Ambulatory Visit: Payer: Self-pay | Admitting: Oncology

## 2015-05-17 DIAGNOSIS — T451X5A Adverse effect of antineoplastic and immunosuppressive drugs, initial encounter: Secondary | ICD-10-CM

## 2015-05-17 DIAGNOSIS — D701 Agranulocytosis secondary to cancer chemotherapy: Secondary | ICD-10-CM | POA: Insufficient documentation

## 2015-05-19 ENCOUNTER — Other Ambulatory Visit: Payer: Self-pay

## 2015-05-19 ENCOUNTER — Encounter: Payer: Self-pay | Admitting: Oncology

## 2015-05-19 DIAGNOSIS — J45909 Unspecified asthma, uncomplicated: Secondary | ICD-10-CM

## 2015-05-19 MED ORDER — ALBUTEROL SULFATE HFA 108 (90 BASE) MCG/ACT IN AERS: 2.0000 | INHALATION_SPRAY | RESPIRATORY_TRACT | Status: DC | PRN

## 2015-05-19 MED ORDER — BUDESONIDE-FORMOTEROL FUMARATE 160-4.5 MCG/ACT IN AERO: 2.0000 | INHALATION_SPRAY | Freq: Two times a day (BID) | RESPIRATORY_TRACT | Status: DC

## 2015-05-19 NOTE — Progress Notes (Signed)
Enrolled pt in the [employer] [place]. Pt is approved for drugs associated w/ her Dx for 12 months from 05/19/15 for $5,000. $1,650 is her guaranteed award, the $3,350 is accessible on a [employer] basis as long as funding remains available. I will send copy of approval letter and POE to [facility] in billing.

## 2015-05-20 ENCOUNTER — Telehealth: Payer: Self-pay | Admitting: Oncology

## 2015-05-20 NOTE — Telephone Encounter (Signed)
Injection for 2/11, 2/18 and 2/25 per 2/4 pof already on schedule and patient already aware. Per pof can be Saturday or Friday. No changes made - no other orders per pof,

## 2015-05-21 ENCOUNTER — Other Ambulatory Visit: Payer: Self-pay | Admitting: Oncology

## 2015-05-21 MED FILL — SYMBICORT 160-4.5 MCG INH: 160-4.5 | 30 days supply | Qty: 10 | Fill #0

## 2015-05-22 ENCOUNTER — Other Ambulatory Visit: Payer: Medicare HMO

## 2015-05-22 ENCOUNTER — Telehealth: Payer: Self-pay

## 2015-05-22 ENCOUNTER — Telehealth: Payer: Self-pay | Admitting: Oncology

## 2015-05-22 ENCOUNTER — Ambulatory Visit: Payer: Medicare HMO

## 2015-05-22 ENCOUNTER — Ambulatory Visit: Payer: Medicare HMO | Admitting: Oncology

## 2015-05-22 NOTE — Telephone Encounter (Signed)
Pt called stating she will not make today's appt. She has been sick since last Thursday. She describes being weak, barely making it to the bathroom, was finally able to take a bath 2 days ago. She was having joint pain in her knees for about 2 days. She was a bit dizzy. She states she is drinking more water than ever. She is eating 2 meals /day, basically unchanged. She denies constipation. She denies fever. Also her husband is not feeling well to drive her to [facility] today. She is wanting to r/s with Dr Edwyna Shell. She denied need for [facility]. She is questioning continuing the chemo treatment.

## 2015-05-22 NOTE — Telephone Encounter (Signed)
Medical Oncology  MD called patient directly after she let office know that she would not keep scheduled appointment this afternoon due to feeling badly and weak. Told patient that very best would be for her to keep appointment today, as she is on active chemo. Patient will try to get ride for today.  L.Lennette Fader,MD

## 2015-05-23 ENCOUNTER — Ambulatory Visit: Payer: Medicare HMO

## 2015-05-27 ENCOUNTER — Telehealth: Payer: Self-pay

## 2015-05-27 ENCOUNTER — Other Ambulatory Visit: Payer: Self-pay | Admitting: Oncology

## 2015-05-27 NOTE — Telephone Encounter (Signed)
Patient Demographics     Patient Name Sex DOB SSN Address Phone    [name], [name] Female December 01, 1960 [id number] [place] [id number] [phone] (Home) [phone] (Mobile)      Message  Received: Today    Gordy Levan, MD  Baruch Merl, RN           Patient cancelled MD apt last week even after speaking with RN and MD.  Not presently scheduled to MD. Has chemo scheduled this week and next.  Please follow up with her by phone. If problems she will need to be seen prior to more treatment.  Needs to be scheduled back to MD even if no problems now.   I need to confirm chemo and granix if trying to continue treatment.   thanks

## 2015-05-27 NOTE — Telephone Encounter (Signed)
LM requesting that [name] call back tomorrow to discuss her current condition,  further treatment, and follow up as noted below by Dr. Marko Plume.

## 2015-05-28 ENCOUNTER — Telehealth: Payer: Self-pay | Admitting: *Deleted

## 2015-05-28 MED FILL — VENTOLIN HFA 90 MCG INHALER: 108 (90 BAS | 16 days supply | Qty: 18 | Fill #0

## 2015-05-28 NOTE — Telephone Encounter (Signed)
[name] will come to [facility] tomorrow at 1145 for lab and then see Dr. Marko Plume at 1230  prior to getting treatment at 1400. Ms. Sterbenz knows to take the 5 decadron 4 mg tabs at 0200  05-29-15 with some food for Taxol premed.

## 2015-05-28 NOTE — Telephone Encounter (Signed)
Per desk RN I have moved appts and desk RN to call patient

## 2015-05-29 ENCOUNTER — Telehealth: Payer: Self-pay | Admitting: Oncology

## 2015-05-29 ENCOUNTER — Ambulatory Visit (HOSPITAL_BASED_OUTPATIENT_CLINIC_OR_DEPARTMENT_OTHER): Payer: Medicare HMO | Admitting: Oncology

## 2015-05-29 ENCOUNTER — Ambulatory Visit (HOSPITAL_BASED_OUTPATIENT_CLINIC_OR_DEPARTMENT_OTHER): Payer: Medicare HMO

## 2015-05-29 ENCOUNTER — Other Ambulatory Visit (HOSPITAL_BASED_OUTPATIENT_CLINIC_OR_DEPARTMENT_OTHER): Payer: Medicare HMO

## 2015-05-29 ENCOUNTER — Ambulatory Visit: Payer: Medicare HMO

## 2015-05-29 ENCOUNTER — Encounter: Payer: Self-pay | Admitting: Oncology

## 2015-05-29 VITALS — BP 127/72 | HR 85 | Temp 97.7°F | Resp 18 | Ht 62.75 in | Wt 287.4 lb

## 2015-05-29 DIAGNOSIS — C562 Malignant neoplasm of left ovary: Secondary | ICD-10-CM

## 2015-05-29 DIAGNOSIS — I10 Essential (primary) hypertension: Secondary | ICD-10-CM | POA: Diagnosis not present

## 2015-05-29 DIAGNOSIS — Z91199 Patient's noncompliance with other medical treatment and regimen due to unspecified reason: Secondary | ICD-10-CM

## 2015-05-29 DIAGNOSIS — D6481 Anemia due to antineoplastic chemotherapy: Secondary | ICD-10-CM | POA: Diagnosis not present

## 2015-05-29 DIAGNOSIS — Z95828 Presence of other vascular implants and grafts: Secondary | ICD-10-CM

## 2015-05-29 DIAGNOSIS — D709 Neutropenia, unspecified: Secondary | ICD-10-CM | POA: Diagnosis not present

## 2015-05-29 DIAGNOSIS — Z5111 Encounter for antineoplastic chemotherapy: Secondary | ICD-10-CM

## 2015-05-29 DIAGNOSIS — Z9111 Patient's noncompliance with dietary regimen: Secondary | ICD-10-CM

## 2015-05-29 DIAGNOSIS — T451X5A Adverse effect of antineoplastic and immunosuppressive drugs, initial encounter: Secondary | ICD-10-CM

## 2015-05-29 DIAGNOSIS — D6959 Other secondary thrombocytopenia: Secondary | ICD-10-CM

## 2015-05-29 DIAGNOSIS — D701 Agranulocytosis secondary to cancer chemotherapy: Secondary | ICD-10-CM

## 2015-05-29 LAB — CBC WITH DIFFERENTIAL/PLATELET
BASO%: 0.2 % (ref 0.0–2.0)
BASOS ABS: 0 10*3/uL (ref 0.0–0.1)
EOS ABS: 0 10*3/uL (ref 0.0–0.5)
EOS%: 0.1 % (ref 0.0–7.0)
HCT: 29.1 % — ABNORMAL LOW (ref 34.8–46.6)
HGB: 9.3 g/dL — ABNORMAL LOW (ref 11.6–15.9)
LYMPH%: 23.8 % (ref 14.0–49.7)
MCH: 28.2 pg (ref 25.1–34.0)
MCHC: 32 g/dL (ref 31.5–36.0)
MCV: 88.1 fL (ref 79.5–101.0)
MONO#: 0 10*3/uL — ABNORMAL LOW (ref 0.1–0.9)
MONO%: 1.9 % (ref 0.0–14.0)
NEUT#: 1.8 10*3/uL (ref 1.5–6.5)
NEUT%: 74 % (ref 38.4–76.8)
Platelets: 337 10*3/uL (ref 145–400)
RBC: 3.3 10*6/uL — AB (ref 3.70–5.45)
RDW: 20.6 % — ABNORMAL HIGH (ref 11.2–14.5)
WBC: 2.4 10*3/uL — AB (ref 3.9–10.3)
lymph#: 0.6 10*3/uL — ABNORMAL LOW (ref 0.9–3.3)

## 2015-05-29 LAB — COMPREHENSIVE METABOLIC PANEL
ALK PHOS: 80 U/L (ref 40–150)
ALT: 22 U/L (ref 0–55)
AST: 19 U/L (ref 5–34)
Albumin: 3.4 g/dL — ABNORMAL LOW (ref 3.5–5.0)
Anion Gap: 14 mEq/L — ABNORMAL HIGH (ref 3–11)
BUN: 19.9 mg/dL (ref 7.0–26.0)
CO2: 22 meq/L (ref 22–29)
Calcium: 9.3 mg/dL (ref 8.4–10.4)
Chloride: 102 mEq/L (ref 98–109)
Creatinine: 1 mg/dL (ref 0.6–1.1)
EGFR: 61 mL/min/{1.73_m2} — AB (ref >90)
GLUCOSE: 236 mg/dL — AB (ref 70–140)
POTASSIUM: 3.6 meq/L (ref 3.5–5.1)
SODIUM: 137 meq/L (ref 136–145)
Total Bilirubin: 0.46 mg/dL (ref 0.20–1.20)
Total Protein: 8.2 g/dL (ref 6.4–8.3)

## 2015-05-29 MED ORDER — SODIUM CHLORIDE 0.9 % IV SOLN
Freq: Once | INTRAVENOUS | Status: AC
  Administered 2015-05-29: 15:00:00 via INTRAVENOUS
  Filled 2015-05-29: qty 8

## 2015-05-29 MED ORDER — DIPHENHYDRAMINE HCL 50 MG/ML IJ SOLN
INTRAMUSCULAR | Status: AC
  Filled 2015-05-29: qty 1

## 2015-05-29 MED ORDER — FAMOTIDINE IN NACL 20-0.9 MG/50ML-% IV SOLN
20.0000 mg | Freq: Once | INTRAVENOUS | Status: AC
  Administered 2015-05-29: 20 mg via INTRAVENOUS

## 2015-05-29 MED ORDER — SODIUM CHLORIDE 0.9 % IJ SOLN
10.0000 mL | INTRAMUSCULAR | Status: DC | PRN
  Administered 2015-05-29: 10 mL
  Filled 2015-05-29: qty 10

## 2015-05-29 MED ORDER — SODIUM CHLORIDE 0.9 % IV SOLN
80.0000 mg/m2 | Freq: Once | INTRAVENOUS | Status: AC
  Administered 2015-05-29: 192 mg via INTRAVENOUS
  Filled 2015-05-29: qty 32

## 2015-05-29 MED ORDER — FAMOTIDINE IN NACL 20-0.9 MG/50ML-% IV SOLN
INTRAVENOUS | Status: AC
Start: 2015-05-29 — End: 2015-05-29
  Filled 2015-05-29: qty 50

## 2015-05-29 MED ORDER — DIPHENHYDRAMINE HCL 50 MG/ML IJ SOLN
25.0000 mg | Freq: Once | INTRAMUSCULAR | Status: AC
  Administered 2015-05-29: 25 mg via INTRAVENOUS

## 2015-05-29 MED ORDER — HEPARIN SOD (PORK) LOCK FLUSH 100 UNIT/ML IV SOLN
500.0000 [IU] | Freq: Once | INTRAVENOUS | Status: AC | PRN
  Administered 2015-05-29: 500 [IU]
  Filled 2015-05-29: qty 5

## 2015-05-29 MED ORDER — LORAZEPAM 1 MG PO TABS
1.0000 mg | ORAL_TABLET | Freq: Once | ORAL | Status: AC | PRN
  Administered 2015-05-29: 1 mg via ORAL

## 2015-05-29 MED ORDER — SODIUM CHLORIDE 0.9 % IV SOLN
600.0000 mg | Freq: Once | INTRAVENOUS | Status: AC
  Administered 2015-05-29: 600 mg via INTRAVENOUS
  Filled 2015-05-29: qty 60

## 2015-05-29 MED ORDER — SODIUM CHLORIDE 0.9% FLUSH
10.0000 mL | INTRAVENOUS | Status: DC | PRN
Start: 2015-05-29 — End: 2015-05-29
  Administered 2015-05-29: 10 mL via INTRAVENOUS
  Filled 2015-05-29: qty 10

## 2015-05-29 MED ORDER — LORAZEPAM 1 MG PO TABS
ORAL_TABLET | ORAL | Status: AC
  Filled 2015-05-29: qty 1

## 2015-05-29 MED ORDER — SODIUM CHLORIDE 0.9 % IV SOLN
Freq: Once | INTRAVENOUS | Status: AC
  Administered 2015-05-29: 14:00:00 via INTRAVENOUS

## 2015-05-29 NOTE — Patient Instructions (Signed)
[facility] Discharge Instructions for Patients Receiving Chemotherapy  Today you received the following chemotherapy agents Taxol/Carboplatin.  To help prevent nausea and vomiting after your treatment, we encourage you to take your nausea medication as prescribed.   If you develop nausea and vomiting that is not controlled by your nausea medication, call the clinic.   BELOW ARE SYMPTOMS THAT SHOULD BE REPORTED IMMEDIATELY:  *FEVER GREATER THAN 100.5 F  *CHILLS WITH OR WITHOUT FEVER  NAUSEA AND VOMITING THAT IS NOT CONTROLLED WITH YOUR NAUSEA MEDICATION  *UNUSUAL SHORTNESS OF BREATH  *UNUSUAL BRUISING OR BLEEDING  TENDERNESS IN MOUTH AND THROAT WITH OR WITHOUT PRESENCE OF ULCERS  *URINARY PROBLEMS  *BOWEL PROBLEMS  UNUSUAL RASH Items with * indicate a potential emergency and should be followed up as soon as possible.  Feel free to call the clinic you have any questions or concerns. The clinic phone number is [phone].  Please show the [facility] at check-in to the Emergency Department and triage nurse.  Take Ativan and not Xanax. If you need to take the Xanax make sure it is at least 4 hours apart from taking the Ativan. Be sure to take Zofran in the morning.

## 2015-05-29 NOTE — Progress Notes (Signed)
Pt questioning if she is due Granix injection on 05/30/15 and 05/31/15.  Pt states that Dr. Marko Plume stated that she is to receive Granix x2 days when she receives both chemos.  Phone message left for Louise RN at Dr. Mariana Kaufman desk.  Pt will return on 05/30/15 for injection as scheduled.

## 2015-05-29 NOTE — Telephone Encounter (Signed)
Appointments made and avs printed °

## 2015-05-29 NOTE — Progress Notes (Signed)
OFFICE PROGRESS NOTE   May 29, 2015   Physicians:Emma Gloris Manchester, MD (PCP), Madie Reno (general surgery [facility] Nearing), Wilhemina Bonito, Delfin Edis)  INTERVAL HISTORY:   Patient is seen, together with husband, for rescheduled visit today after she missed granix injection 2-4, and cancelled MD visit/ lab/ day 15 cycle 2 chemo on 05-22-15.  She is willing to continue treatment with day 1 cycle 3 carbo taxol today, and understands that she needs granix on 2-17 and 05-31-15.   Patient reports that she has felt much better overall this week. Folliculitis/ acne on upper back and scalp is much better. Appetite has improved, no diarrhea and bowels are moving, no abdominal or pelvic pain, no fever (Tmax 99 at hs) or symptoms of infection. No bleeding. No problems with PAC. Slight numbness in left foot only, no peripheral neuropathy otherwise. She has not taken oral iron since prior to 05-15-15 chemotherapy.  Remainder of 10 point Review of Systems unchanged/ negative.   PAC placed by IR 05-06-15 Refused flu vaccine. No genetics testing CA 125 was 10 preop    ONCOLOGIC HISTORY Patient has been followed closely by Dr Corinna Capra of gyn, including hysteroscopy D & C 08-2013 for bleeding, with benign endometrial polyp then ([id number]). She had CT at outside facility [phone] because of umbilical hernia, which showed 6 cm left adnexal mass with imaging consistent with ovarian cyst. She had follow up imaging in 12-2014 with cyst 6.9 cm, not complex, and CA 125 normal at 10 on 12-18-14. By repeat US 01-23-15 the area measured 9.7 x 7.0 x 6.4 cm with normal flow to ovary, no flow to cyst and no free fluid, endometrial stripe 3.9 mm; patient was then having some pelvic pressure. She had abdominal hysterectomy BSO by Dr Corinna Capra on 02-11-15, with operative findings of adhesions from left adnexal mass to bowel and pelvic sidewall, no ascites, normal appearing omentum, normal appendix; there was unavoidable  intraoperative rupture during surgery. Pathology [id number]) left clear cell ovarian carcinoma at least 7 cy with capsule rupture, benign left tube, right ovary and tube and uterus. Cytology of washings negative for malignancy ([id number]). Post operative course has been uncomplicated. CT CAP 03-07-15 showed no apparent metastatic disease, with hepatic steatosis, abdominal aortic atherosclerosis and some fluid at paraumbilical hernia. Patient saw Dr Denman George in consultation on 02-24-15, with recommendation for 6 cycles carboplatin taxol adjuvantly. She had day 1 cycle 1 dose dense carbo taxol on12-14-16. Day 8 cycle 1 held with ANC 1.3, granix 480 added day after each treatment. D8C2 delayed with neutropenia and other issues, day 8 cycle 2 given 05-15-15.. .  Objective:  Vital signs in last 24 hours:  BP 127/72 mmHg  Pulse 85  Temp(Src) 97.7 F (36.5 C) (Oral)  Resp 18  Ht 5' 2.75" (1.594 m)  Wt 287 lb 6.4 oz (130.364 kg)  BMI 51.31 kg/m2  SpO2 100% Weight up 4 lbs. Alert, oriented and appropriate. Ambulatory without assistance. Talkative, looks comfortable, respirations not labored RA Complete alopecia  HEENT:PERRL, sclerae not icteric. Oral mucosa moist without lesions, posterior pharynx clear. Scalp folliculitis almost completely resolved, no areas of concern. Neck supple. No JVD.  Lymphatics:no cervical,supraclavicular adenopathy Resp: clear to auscultation bilaterally and normal percussion bilaterally Cardio: regular rate and rhythm. No gallop. WN:UUVOZDG obese,  soft, nontender, not distended, no appreciable mass or organomegaly. Normally active bowel sounds.  Musculoskeletal/ Extremities: without pitting edema, cords, tenderness Neuro: no significant peripheral neuropathy. Otherwise nonfocal. PSYCH appropriate mood and affect Skin otherwise  without rash, ecchymosis, petechiae Portacath-without erythema or tenderness  Lab Results:  Results for orders placed or performed in visit on  05/29/15  CBC with Differential  Result Value Ref Range   WBC 2.4 (L) 3.9 - 10.3 10e3/uL   NEUT# 1.8 1.5 - 6.5 10e3/uL   HGB 9.3 (L) 11.6 - 15.9 g/dL   HCT 29.1 (L) 34.8 - 46.6 %   Platelets 337 145 - 400 10e3/uL   MCV 88.1 79.5 - 101.0 fL   MCH 28.2 25.1 - 34.0 pg   MCHC 32.0 31.5 - 36.0 g/dL   RBC 3.30 (L) 3.70 - 5.45 10e6/uL   RDW 20.6 (H) 11.2 - 14.5 %   lymph# 0.6 (L) 0.9 - 3.3 10e3/uL   MONO# 0.0 (L) 0.1 - 0.9 10e3/uL   Eosinophils Absolute 0.0 0.0 - 0.5 10e3/uL   Basophils Absolute 0.0 0.0 - 0.1 10e3/uL   NEUT% 74.0 38.4 - 76.8 %   LYMPH% 23.8 14.0 - 49.7 %   MONO% 1.9 0.0 - 14.0 %   EOS% 0.1 0.0 - 7.0 %   BASO% 0.2 0.0 - 2.0 %  Comprehensive metabolic panel  Result Value Ref Range   Sodium 137 136 - 145 mEq/L   Potassium 3.6 3.5 - 5.1 mEq/L   Chloride 102 98 - 109 mEq/L   CO2 22 22 - 29 mEq/L   Glucose 236 (H) 70 - 140 mg/dl   BUN 19.9 7.0 - 26.0 mg/dL   Creatinine 1.0 0.6 - 1.1 mg/dL   Total Bilirubin 0.46 0.20 - 1.20 mg/dL   Alkaline Phosphatase 80 40 - 150 U/L   AST 19 5 - 34 U/L   ALT 22 0 - 55 U/L   Total Protein 8.2 6.4 - 8.3 g/dL   Albumin 3.4 (L) 3.5 - 5.0 g/dL   Calcium 9.3 8.4 - 10.4 mg/dL   Anion Gap 14 (H) 3 - 11 mEq/L   EGFR 61 (L) >90 ml/min/1.73 m2     Studies/Results:  No results found.  Medications: I have reviewed the patient's current medications. She prefers not to use premedication oral decadron for taxol, understands that not taking this could increase risk for allergic reactions.   DISCUSSION I have told patient very clearly that this chemotherapy is recommended to try to prevent recurrence of high risk ovarian cancer (clear cell). Skipping and delaying chemo treatments will not allow it to be as effective as otherwise, even tho certainly treatments and supportive interventions are never convenient or free of side effects. I have offered that she can stop treatment now or at any point if she does not want to continue, however she tells  me that she would like to continue. She does not want to change regimen to every 3 week treatment even with additional appointments on the weekly regimen.  Assessment/Plan:   1. Clear cell carcinoma of ovary clinically IC: found incidentally on CT done for unrelated umbilical hernia, normal CA 125, post abdominal hysterectomy BSO (incomplete staging) by Dr Corinna Capra 02-11-15. Adjuvant carboplatin taxol begun 03-26-15 using dose dense regimen, delays for chemo neutropenia despite gCSF used thus far, and for chemo thrombocytopenia; also required PRBCs on 05-01-15 for chemo anemia, other appointments cancelled by patient. Will need to used gCSF ongoing with chemo, at least days 2,3,9, 16. She will be treated weekly if ANC >=1.5 and plt >=100k otherwise ask MD. If treatment held due to low ANC, she should have granix while at office that day as transportation is an issue.  Patient prefers not to take oral premedication decadron for taxol. 2 skin areas all improved 3. Progressive anemia: seems chemo related, iron studies low/ low normal in early April 22, 2023, patient encouraged to take oral iron, follow. 4.Poor peripheral IV access, PAC rescheduled x 2 before placement 5.refuses flu vaccine: discussed Tamiflu, either to have available or prescription at onset of symptoms or if known exposure.5. 6.history of crohn's disease/ ileitis, which has not required intervention in past 10 years. Known to [facility].  7.history of melanoma right posterior thigh 2003 with 2 sentinel nodes negative. Followed by dermatology 8.umbilical hernia since 4961, stable 9.multiple drug intolerances. EMR notes is able to take Rocephin  10.HTN x ~ 10 years followed now by PCP 11.GERD longstanding and unchanged 12.hepatic steatosis by CT 13. Thoracic and abdominal aortic atherosclerosis by CT 14. History of chronic anxiety and panic attacks 15.morbid obesity, BMI >50 16.asthma: on regular treatment, exacerbations generally with respiratory  illnesses. Intolerant to ASA 17.mammograms due at [facility], but will wait until present problems are better managed or chemo completed for these.   All questions addressed and patient/ husband seem to understand conversation. Chemo and granix orders confirmed. Time spent 30 min including >50% counseling and coordination of care. Cc Dr Becky Sax, MD   05/29/2015, 2:00 PM

## 2015-05-30 ENCOUNTER — Ambulatory Visit (HOSPITAL_BASED_OUTPATIENT_CLINIC_OR_DEPARTMENT_OTHER): Payer: Medicare HMO

## 2015-05-30 VITALS — BP 140/90 | HR 62 | Temp 98.3°F

## 2015-05-30 DIAGNOSIS — C562 Malignant neoplasm of left ovary: Secondary | ICD-10-CM | POA: Diagnosis not present

## 2015-05-30 DIAGNOSIS — Z5189 Encounter for other specified aftercare: Secondary | ICD-10-CM | POA: Diagnosis not present

## 2015-05-30 DIAGNOSIS — C569 Malignant neoplasm of unspecified ovary: Secondary | ICD-10-CM

## 2015-05-30 MED ORDER — TBO-FILGRASTIM 480 MCG/0.8ML ~~LOC~~ SOSY
480.0000 ug | PREFILLED_SYRINGE | Freq: Once | SUBCUTANEOUS | Status: AC
  Administered 2015-05-30: 480 ug via SUBCUTANEOUS
  Filled 2015-05-30: qty 0.8

## 2015-05-31 ENCOUNTER — Ambulatory Visit (HOSPITAL_BASED_OUTPATIENT_CLINIC_OR_DEPARTMENT_OTHER): Payer: Medicare HMO

## 2015-05-31 ENCOUNTER — Ambulatory Visit: Payer: Medicare HMO

## 2015-05-31 VITALS — BP 110/41 | HR 81 | Temp 98.2°F | Resp 18 | Ht 62.75 in

## 2015-05-31 DIAGNOSIS — C569 Malignant neoplasm of unspecified ovary: Secondary | ICD-10-CM

## 2015-05-31 DIAGNOSIS — Z5189 Encounter for other specified aftercare: Secondary | ICD-10-CM

## 2015-05-31 DIAGNOSIS — C562 Malignant neoplasm of left ovary: Secondary | ICD-10-CM

## 2015-05-31 MED ORDER — TBO-FILGRASTIM 480 MCG/0.8ML ~~LOC~~ SOSY
480.0000 ug | PREFILLED_SYRINGE | Freq: Once | SUBCUTANEOUS | Status: AC
  Administered 2015-05-31: 480 ug via SUBCUTANEOUS

## 2015-05-31 NOTE — Patient Instructions (Signed)

## 2015-06-02 NOTE — Telephone Encounter (Signed)
err

## 2015-06-05 ENCOUNTER — Ambulatory Visit (HOSPITAL_COMMUNITY)
Admission: RE | Admit: 2015-06-05 | Discharge: 2015-06-05 | Disposition: A | Discharge status: Home / Self Care | Payer: Medicare HMO | Source: Ambulatory Visit | Attending: Oncology | Admitting: Oncology

## 2015-06-05 ENCOUNTER — Ambulatory Visit (HOSPITAL_BASED_OUTPATIENT_CLINIC_OR_DEPARTMENT_OTHER): Payer: Medicare HMO

## 2015-06-05 ENCOUNTER — Encounter: Payer: Self-pay | Admitting: Oncology

## 2015-06-05 ENCOUNTER — Other Ambulatory Visit: Payer: Self-pay | Admitting: Oncology

## 2015-06-05 ENCOUNTER — Other Ambulatory Visit (HOSPITAL_BASED_OUTPATIENT_CLINIC_OR_DEPARTMENT_OTHER): Payer: Medicare HMO

## 2015-06-05 VITALS — BP 106/58 | HR 68 | Temp 98.4°F | Resp 16

## 2015-06-05 DIAGNOSIS — T451X5A Adverse effect of antineoplastic and immunosuppressive drugs, initial encounter: Secondary | ICD-10-CM | POA: Diagnosis not present

## 2015-06-05 DIAGNOSIS — D6481 Anemia due to antineoplastic chemotherapy: Secondary | ICD-10-CM

## 2015-06-05 DIAGNOSIS — C562 Malignant neoplasm of left ovary: Secondary | ICD-10-CM

## 2015-06-05 DIAGNOSIS — Z5189 Encounter for other specified aftercare: Secondary | ICD-10-CM

## 2015-06-05 DIAGNOSIS — C569 Malignant neoplasm of unspecified ovary: Secondary | ICD-10-CM

## 2015-06-05 LAB — CBC WITH DIFFERENTIAL/PLATELET
BASO%: 1 % (ref 0.0–2.0)
BASOS ABS: 0 10*3/uL (ref 0.0–0.1)
EOS%: 0.6 % (ref 0.0–7.0)
Eosinophils Absolute: 0 10*3/uL (ref 0.0–0.5)
HEMATOCRIT: 24.7 % — AB (ref 34.8–46.6)
HGB: 8 g/dL — ABNORMAL LOW (ref 11.6–15.9)
LYMPH#: 0.9 10*3/uL (ref 0.9–3.3)
LYMPH%: 44.6 % (ref 14.0–49.7)
MCH: 28.4 pg (ref 25.1–34.0)
MCHC: 32.4 g/dL (ref 31.5–36.0)
MCV: 87.9 fL (ref 79.5–101.0)
MONO#: 0.2 10*3/uL (ref 0.1–0.9)
MONO%: 12.5 % (ref 0.0–14.0)
NEUT#: 0.8 10*3/uL — ABNORMAL LOW (ref 1.5–6.5)
NEUT%: 41.3 % (ref 38.4–76.8)
Platelets: 239 10*3/uL (ref 145–400)
RBC: 2.82 10*6/uL — ABNORMAL LOW (ref 3.70–5.45)
RDW: 21.3 % — ABNORMAL HIGH (ref 11.2–14.5)
WBC: 2 10*3/uL — ABNORMAL LOW (ref 3.9–10.3)

## 2015-06-05 LAB — COMPREHENSIVE METABOLIC PANEL
ALBUMIN: 3.4 g/dL — AB (ref 3.5–5.0)
ALK PHOS: 82 U/L (ref 40–150)
ALT: 29 U/L (ref 0–55)
AST: 24 U/L (ref 5–34)
Anion Gap: 9 mEq/L (ref 3–11)
BUN: 32.8 mg/dL — AB (ref 7.0–26.0)
CO2: 27 meq/L (ref 22–29)
Calcium: 8.7 mg/dL (ref 8.4–10.4)
Chloride: 103 mEq/L (ref 98–109)
Creatinine: 1 mg/dL (ref 0.6–1.1)
EGFR: 60 mL/min/{1.73_m2} — AB (ref >90)
GLUCOSE: 95 mg/dL (ref 70–140)
POTASSIUM: 3.5 meq/L (ref 3.5–5.1)
SODIUM: 139 meq/L (ref 136–145)
TOTAL PROTEIN: 7.4 g/dL (ref 6.4–8.3)
Total Bilirubin: 0.58 mg/dL (ref 0.20–1.20)

## 2015-06-05 LAB — PREPARE RBC (CROSSMATCH)

## 2015-06-05 MED ORDER — SODIUM CHLORIDE 0.9 % IV SOLN
Freq: Once | INTRAVENOUS | Status: AC
  Administered 2015-06-05: 11:00:00 via INTRAVENOUS

## 2015-06-05 MED ORDER — ACETAMINOPHEN 325 MG PO TABS
325.0000 mg | ORAL_TABLET | Freq: Once | ORAL | Status: AC
  Administered 2015-06-05: 325 mg via ORAL

## 2015-06-05 MED ORDER — ACETAMINOPHEN 325 MG PO TABS
ORAL_TABLET | ORAL | Status: AC
  Filled 2015-06-05: qty 1

## 2015-06-05 MED ORDER — TBO-FILGRASTIM 480 MCG/0.8ML ~~LOC~~ SOSY
480.0000 ug | PREFILLED_SYRINGE | Freq: Once | SUBCUTANEOUS | Status: AC
  Administered 2015-06-05: 480 ug via SUBCUTANEOUS
  Filled 2015-06-05: qty 0.8

## 2015-06-05 MED ORDER — HEPARIN SOD (PORK) LOCK FLUSH 100 UNIT/ML IV SOLN
500.0000 [IU] | Freq: Once | INTRAVENOUS | Status: AC
  Administered 2015-06-05: 500 [IU] via INTRAVENOUS
  Filled 2015-06-05: qty 5

## 2015-06-05 MED ORDER — SODIUM CHLORIDE 0.9% FLUSH
10.0000 mL | INTRAVENOUS | Status: DC | PRN
  Administered 2015-06-05: 10 mL via INTRAVENOUS
  Filled 2015-06-05: qty 10

## 2015-06-05 NOTE — Progress Notes (Signed)
Patient's WBC 2.0 and ANC of 0.8 and Hgb 8.0 .  Per Dr Marko Plume will transfuse one unit PRBC and give granix today and tomorrow and hold treatment.

## 2015-06-05 NOTE — Progress Notes (Signed)
Medical Oncology  Neutropenic and more anemic today. Chemo held, will give granix today and 2-24. One unit PRBCs today.  Godfrey Pick, MD

## 2015-06-06 ENCOUNTER — Ambulatory Visit (HOSPITAL_BASED_OUTPATIENT_CLINIC_OR_DEPARTMENT_OTHER): Payer: Medicare HMO

## 2015-06-06 VITALS — BP 120/57 | HR 92 | Temp 98.7°F

## 2015-06-06 DIAGNOSIS — C562 Malignant neoplasm of left ovary: Secondary | ICD-10-CM | POA: Diagnosis not present

## 2015-06-06 DIAGNOSIS — Z5189 Encounter for other specified aftercare: Secondary | ICD-10-CM | POA: Diagnosis not present

## 2015-06-06 DIAGNOSIS — C569 Malignant neoplasm of unspecified ovary: Secondary | ICD-10-CM

## 2015-06-06 LAB — TYPE AND SCREEN
ABO/RH(D): O POS
Antibody Screen: NEGATIVE
UNIT DIVISION: 0

## 2015-06-06 MED ORDER — TBO-FILGRASTIM 480 MCG/0.8ML ~~LOC~~ SOSY
480.0000 ug | PREFILLED_SYRINGE | Freq: Once | SUBCUTANEOUS | Status: AC
  Administered 2015-06-06: 480 ug via SUBCUTANEOUS
  Filled 2015-06-06: qty 0.8

## 2015-06-08 ENCOUNTER — Other Ambulatory Visit: Payer: Self-pay | Admitting: Oncology

## 2015-06-11 MED FILL — VENTOLIN HFA 90 MCG INHALER: 108 (90 BAS | 16 days supply | Qty: 18 | Fill #1

## 2015-06-12 ENCOUNTER — Encounter: Payer: Self-pay | Admitting: Oncology

## 2015-06-12 ENCOUNTER — Ambulatory Visit: Payer: Medicare HMO

## 2015-06-12 ENCOUNTER — Ambulatory Visit (HOSPITAL_BASED_OUTPATIENT_CLINIC_OR_DEPARTMENT_OTHER): Payer: Medicare HMO | Admitting: Oncology

## 2015-06-12 ENCOUNTER — Other Ambulatory Visit (HOSPITAL_BASED_OUTPATIENT_CLINIC_OR_DEPARTMENT_OTHER): Payer: Medicare HMO

## 2015-06-12 ENCOUNTER — Telehealth: Payer: Self-pay | Admitting: Oncology

## 2015-06-12 VITALS — BP 134/54 | HR 73 | Temp 98.2°F | Resp 18 | Ht 62.75 in | Wt 291.3 lb

## 2015-06-12 DIAGNOSIS — C569 Malignant neoplasm of unspecified ovary: Secondary | ICD-10-CM

## 2015-06-12 DIAGNOSIS — D649 Anemia, unspecified: Secondary | ICD-10-CM | POA: Diagnosis not present

## 2015-06-12 DIAGNOSIS — D701 Agranulocytosis secondary to cancer chemotherapy: Secondary | ICD-10-CM | POA: Diagnosis not present

## 2015-06-12 DIAGNOSIS — Z9111 Patient's noncompliance with dietary regimen: Secondary | ICD-10-CM

## 2015-06-12 DIAGNOSIS — D6481 Anemia due to antineoplastic chemotherapy: Secondary | ICD-10-CM

## 2015-06-12 DIAGNOSIS — Z6841 Body Mass Index (BMI) 40.0 and over, adult: Secondary | ICD-10-CM

## 2015-06-12 DIAGNOSIS — Z95828 Presence of other vascular implants and grafts: Secondary | ICD-10-CM

## 2015-06-12 DIAGNOSIS — Z452 Encounter for adjustment and management of vascular access device: Secondary | ICD-10-CM | POA: Diagnosis not present

## 2015-06-12 DIAGNOSIS — D6959 Other secondary thrombocytopenia: Secondary | ICD-10-CM

## 2015-06-12 DIAGNOSIS — Z91199 Patient's noncompliance with other medical treatment and regimen due to unspecified reason: Secondary | ICD-10-CM

## 2015-06-12 DIAGNOSIS — C562 Malignant neoplasm of left ovary: Secondary | ICD-10-CM

## 2015-06-12 DIAGNOSIS — T451X5A Adverse effect of antineoplastic and immunosuppressive drugs, initial encounter: Secondary | ICD-10-CM

## 2015-06-12 LAB — CBC WITH DIFFERENTIAL/PLATELET
BASO%: 0.2 % (ref 0.0–2.0)
Basophils Absolute: 0 10*3/uL (ref 0.0–0.1)
EOS ABS: 0 10*3/uL (ref 0.0–0.5)
EOS%: 0.2 % (ref 0.0–7.0)
HEMATOCRIT: 25.9 % — AB (ref 34.8–46.6)
HGB: 8.4 g/dL — ABNORMAL LOW (ref 11.6–15.9)
LYMPH%: 30.3 % (ref 14.0–49.7)
MCH: 29.6 pg (ref 25.1–34.0)
MCHC: 32.4 g/dL (ref 31.5–36.0)
MCV: 91.2 fL (ref 79.5–101.0)
MONO#: 1.1 10*3/uL — AB (ref 0.1–0.9)
MONO%: 27 % — ABNORMAL HIGH (ref 0.0–14.0)
NEUT#: 1.7 10*3/uL (ref 1.5–6.5)
NEUT%: 42.3 % (ref 38.4–76.8)
PLATELETS: 112 10*3/uL — AB (ref 145–400)
RBC: 2.84 10*6/uL — AB (ref 3.70–5.45)
RDW: 19.7 % — ABNORMAL HIGH (ref 11.2–14.5)
WBC: 4 10*3/uL (ref 3.9–10.3)
lymph#: 1.2 10*3/uL (ref 0.9–3.3)

## 2015-06-12 LAB — COMPREHENSIVE METABOLIC PANEL
ALBUMIN: 3.3 g/dL — AB (ref 3.5–5.0)
ALK PHOS: 80 U/L (ref 40–150)
ALT: 21 U/L (ref 0–55)
AST: 20 U/L (ref 5–34)
Anion Gap: 9 mEq/L (ref 3–11)
BUN: 29.9 mg/dL — AB (ref 7.0–26.0)
CHLORIDE: 104 meq/L (ref 98–109)
CO2: 27 meq/L (ref 22–29)
Calcium: 8.7 mg/dL (ref 8.4–10.4)
Creatinine: 1.2 mg/dL — ABNORMAL HIGH (ref 0.6–1.1)
EGFR: 51 mL/min/{1.73_m2} — AB (ref >90)
GLUCOSE: 91 mg/dL (ref 70–140)
POTASSIUM: 3.4 meq/L — AB (ref 3.5–5.1)
SODIUM: 140 meq/L (ref 136–145)
Total Bilirubin: 0.42 mg/dL (ref 0.20–1.20)
Total Protein: 7.3 g/dL (ref 6.4–8.3)

## 2015-06-12 MED ORDER — HEPARIN SOD (PORK) LOCK FLUSH 100 UNIT/ML IV SOLN
500.0000 [IU] | Freq: Once | INTRAVENOUS | Status: AC
  Administered 2015-06-12: 500 [IU] via INTRAVENOUS
  Filled 2015-06-12: qty 5

## 2015-06-12 MED ORDER — TBO-FILGRASTIM 480 MCG/0.8ML ~~LOC~~ SOSY
480.0000 ug | PREFILLED_SYRINGE | Freq: Once | SUBCUTANEOUS | Status: AC
  Administered 2015-06-12: 480 ug via SUBCUTANEOUS
  Filled 2015-06-12: qty 0.8

## 2015-06-12 MED ORDER — SODIUM CHLORIDE 0.9% FLUSH
10.0000 mL | INTRAVENOUS | Status: DC | PRN
  Administered 2015-06-12: 10 mL via INTRAVENOUS
  Filled 2015-06-12: qty 10

## 2015-06-12 NOTE — Telephone Encounter (Signed)
Per 3/2 pof cxd 3/2 and 3/16 chemo - cx 3/3, 3/10 and 3/11 injection. Per pof keep 3/16 LL. All other appointments remain the same. Patient given avs report and updated appointments for March.

## 2015-06-12 NOTE — Patient Instructions (Signed)

## 2015-06-12 NOTE — Progress Notes (Signed)
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 She had day 1 cycle 1 dose dense carbo taxol on 03-26-15; she has had multiple missed and delayed treatments due to cytopenias, noncompliance and other complications and obstacles to treatment. Most recent chemotherapy was day 1 cycle 3 on 05-29-15, with neutropenia requiring granix x4 and PRBCs given 2-23 for symptomatic hgb 8.0.   Patient has a number of complaints today, tho overall seems not to have acute problems. She did feel some better after PRBCs; she has not been taking oral iron on any regular basis, discussed again. She is eating, bowels are moving, no fever or symptoms of infection. She denies abdominal or pelvic pain. She denies increased SOB. No problems with PAC. "Scalp gets hot", some increased acne more upper back than scalp now. Some nausea, no vomiting. No new or different pain. No bleeding. No increased LE swelling. Slight numbness left foot unchanged.  Remainder of 10 point Review of Systems negative / unchanged.     PAC placed by IR 05-06-15 Refused flu vaccine. No genetics testing, tho would be appropriate CA 125 was 10 preop    ONCOLOGIC HISTORY Patient has been followed closely by Dr Corinna Capra of gyn, including hysteroscopy D & C 08-2013 for bleeding, with benign endometrial polyp then ([id number]). She had CT at outside facility [id number] because of umbilical hernia, which showed 6 cm left adnexal mass with imaging consistent with ovarian cyst. She had follow up imaging in 12-2014 with cyst 6.9 cm, not complex, and CA 125 normal at 10 on 12-18-14. By repeat US 01-23-15 the area measured 9.7 x 7.0 x 6.4 cm with normal flow to ovary, no flow to cyst and no free fluid,  endometrial stripe 3.9 mm; patient was then having some pelvic pressure. She had abdominal hysterectomy BSO by Dr Corinna Capra on 02-11-15, with operative findings of adhesions from left adnexal mass to bowel and pelvic sidewall, no ascites, normal appearing omentum, normal appendix; there was unavoidable intraoperative rupture during surgery. Pathology [phone]) left clear cell ovarian carcinoma at least 7 cy with capsule rupture, benign left tube, right ovary and tube and uterus. Cytology of washings negative for malignancy ([id number]). Post operative course has been uncomplicated. CT CAP 03-07-15 showed no apparent metastatic disease, with hepatic steatosis, abdominal aortic atherosclerosis and some fluid at paraumbilical hernia. Patient saw Dr Denman George in consultation on 02-24-15, with recommendation for 6 cycles carboplatin taxol adjuvantly. She had day 1 cycle 1 dose dense carbo taxol on12-14-16. Day 8 cycle 1 held with ANC 1.3, granix 480 added day after each treatment. D8C2 delayed with neutropenia and other issues, day 8 cycle 2 given 05-15-15. Dose dense regimen was continued thru day 1 cycle 3 on 05-29-15, poorly tolerated.    Objective:  Vital signs in last 24 hours:  BP 134/54 mmHg  Pulse 73  Temp(Src) 98.2 F (36.8 C) (Oral)  Resp 18  Ht 5' 2.75" (1.594 m)  Wt 291 lb 4.8 oz (132.133 kg)  BMI 52.00 kg/m2  SpO2 100% Weight up 4 lbs, morbidly obese. Alert, oriented and talkative. Ambulatory at home but uses WC for office.  Alopecia  HEENT:PERRL, sclerae not icteric. Oral mucosa moist without lesions, posterior pharynx clear.  Neck supple. No JVD.  Lymphatics:no  cervical,supraclavicular adenopathy Resp: clear to auscultation bilaterally and normal percussion bilaterally Cardio: regular rate and rhythm. No gallop. GI: abdomen obese, soft, nontender, not distended, no appreciable mass or organomegaly. Small reducible nontender umbilical hernia unchanged. Normally active bowel sounds. Surgical  incision not remarkable. Musculoskeletal/ Extremities: trace - 1+ pedal edema without cords, tenderness Neuro: no increased peripheral neuropathy. Otherwise nonfocal. PSYCH appropriate mood and affect, cooperative  Skin acneiform lesions upper back, without rash, ecchymosis, petechiae Portacath-without erythema or tenderness  Lab Results:  Results for orders placed or performed in visit on 06/12/15  CBC with Differential  Result Value Ref Range   WBC 4.0 3.9 - 10.3 10e3/uL   NEUT# 1.7 1.5 - 6.5 10e3/uL   HGB 8.4 (L) 11.6 - 15.9 g/dL   HCT 25.9 (L) 34.8 - 46.6 %   Platelets 112 (L) 145 - 400 10e3/uL   MCV 91.2 79.5 - 101.0 fL   MCH 29.6 25.1 - 34.0 pg   MCHC 32.4 31.5 - 36.0 g/dL   RBC 2.84 (L) 3.70 - 5.45 10e6/uL   RDW 19.7 (H) 11.2 - 14.5 %   lymph# 1.2 0.9 - 3.3 10e3/uL   MONO# 1.1 (H) 0.1 - 0.9 10e3/uL   Eosinophils Absolute 0.0 0.0 - 0.5 10e3/uL   Basophils Absolute 0.0 0.0 - 0.1 10e3/uL   NEUT% 42.3 38.4 - 76.8 %   LYMPH% 30.3 14.0 - 49.7 %   MONO% 27.0 (H) 0.0 - 14.0 %   EOS% 0.2 0.0 - 7.0 %   BASO% 0.2 0.0 - 2.0 %  Comprehensive metabolic panel  Result Value Ref Range   Sodium 140 136 - 145 mEq/L   Potassium 3.4 (L) 3.5 - 5.1 mEq/L   Chloride 104 98 - 109 mEq/L   CO2 27 22 - 29 mEq/L   Glucose 91 70 - 140 mg/dl   BUN 29.9 (H) 7.0 - 26.0 mg/dL   Creatinine 1.2 (H) 0.6 - 1.1 mg/dL   Total Bilirubin 0.42 0.20 - 1.20 mg/dL   Alkaline Phosphatase 80 40 - 150 U/L   AST 20 5 - 34 U/L   ALT 21 0 - 55 U/L   Total Protein 7.3 6.4 - 8.3 g/dL   Albumin 3.3 (L) 3.5 - 5.0 g/dL   Calcium 8.7 8.4 - 10.4 mg/dL   Anion Gap 9 3 - 11 mEq/L   EGFR 51 (L) >90 ml/min/1.73 m2    CA 125 was not marker preop  Studies/Results:  No results found.  Medications: I have reviewed the patient's current medications. Encouraged po iron  DISCUSSION I have again explained rationale for adjuvant chemotherapy for this high risk ovarian cancer, however it is clear that present regimen is not  manageable despite all efforts from treating staff. I have recommended that we change to every 3 week dosing with neulasta by on pro injector, as office visits/ transportation seem still to be a major obstacle. I would be surprised if full chemo doses will be tolerated from multiple standpoints, tho we can certainly work towards this. With counts only just meeting parameters for single agent low dose taxol today, will hold treatment today to allow some further recovery, then begin every 3 week schedule on 06-19-15 as already set up. Patient and husband have had detailed education about the on pro neulasta injection by RN at time of this visit.  She has given verbal consent for this treatment.     Assessment/Plan:  1. Clear cell carcinoma of ovary clinically IC: found incidentally on  CT done for unrelated umbilical hernia, normal CA 125, incomplete staging with surgery abdominal hysterectomy BSO by Dr Corinna Capra 02-11-15. Adjuvant carboplatin taxol begun 03-26-15 using dose dense regimen, delays for chemo neutropenia despite gCSF used thus far, and for chemo thrombocytopenia; also required PRBCs for chemo anemia and other factors causing multiple missed treatments and delays. Have decided to try every 3 week schedule with on pro neulasta, at least trying to accomplish adjuvant treatment as best possible.  2 area of cellulitis or pressure at coccyx resolved and folliculitis scalp resolved. 3. Progressive anemia: seems chemo related, iron studies low/ low normal in early 2023/04/13, poorly compliant with oral iron. Transfused 1 unit PRBCs on 06-05-15 for Hgb 8.0 and symptomatic 4.Poor peripheral IV access, PAC rescheduled x 2 before placement 5.refuses flu vaccine: discussed Tamiflu, either to have available or prescription at onset of symptoms or if known exposure.5. 6.history of crohn's disease/ ileitis, which has not required intervention in past 10 years. Known to [facility]. Apparent food related gastroenteritis  resolved. Changed zofran to compazine due to GI issues 7.history of melanoma right posterior thigh 2003 with 2 sentinel nodes negative. Followed by dermatology 8.umbilical hernia since 6168, stable 9.multiple drug intolerances. EMR notes is able to take Rocephin  10.HTN x ~ 10 years followed now by PCP 11.GERD longstanding and unchanged 12.hepatic steatosis by CT 13. Thoracic and abdominal aortic atherosclerosis by CT 14. History of chronic anxiety and panic attacks 15.morbid obesity, BMI >50 16.multiple drug and antibiotic intolerances. 17.asthma: on regular treatment, exacerbations generally with respiratory illnesses. Has had wheezing after ASA, listed on allergies. 18.[facility], ok to schedule   Chemo orders adjusted, neulasta by on pro requested, message to managed care for neulasta. She will be treated on 06-19-15 as long as ANC >=1.5 and platelets >100k.   All questions answered. Time spent 30 min by MD, additional teaching by RN, >50% counseling and coordination of care. CC Dr Ilene Qua, Dr Denman George for update  Gordy Levan, MD   06/12/2015, 12:40 PM

## 2015-06-13 ENCOUNTER — Ambulatory Visit: Payer: Medicare HMO

## 2015-06-13 MED FILL — SYMBICORT 160-4.5 MCG INH: 160-4.5 | 30 days supply | Qty: 10 | Fill #1

## 2015-06-15 ENCOUNTER — Other Ambulatory Visit: Payer: Self-pay | Admitting: Oncology

## 2015-06-15 DIAGNOSIS — Z91199 Patient's noncompliance with other medical treatment and regimen due to unspecified reason: Secondary | ICD-10-CM | POA: Insufficient documentation

## 2015-06-15 DIAGNOSIS — Z9111 Patient's noncompliance with dietary regimen: Secondary | ICD-10-CM | POA: Insufficient documentation

## 2015-06-17 ENCOUNTER — Other Ambulatory Visit: Payer: Self-pay

## 2015-06-19 ENCOUNTER — Ambulatory Visit (HOSPITAL_COMMUNITY)
Admission: RE | Admit: 2015-06-19 | Discharge: 2015-06-19 | Disposition: A | Discharge status: Home / Self Care | Payer: Medicare HMO | Source: Ambulatory Visit | Attending: Oncology | Admitting: Oncology

## 2015-06-19 ENCOUNTER — Ambulatory Visit: Payer: Medicare HMO

## 2015-06-19 ENCOUNTER — Encounter: Payer: Self-pay | Admitting: Nurse Practitioner

## 2015-06-19 ENCOUNTER — Other Ambulatory Visit: Payer: Self-pay | Admitting: Oncology

## 2015-06-19 ENCOUNTER — Encounter: Payer: Self-pay | Admitting: Oncology

## 2015-06-19 ENCOUNTER — Ambulatory Visit (HOSPITAL_BASED_OUTPATIENT_CLINIC_OR_DEPARTMENT_OTHER): Payer: Medicare HMO

## 2015-06-19 ENCOUNTER — Ambulatory Visit (HOSPITAL_BASED_OUTPATIENT_CLINIC_OR_DEPARTMENT_OTHER): Payer: Medicare HMO | Admitting: Nurse Practitioner

## 2015-06-19 VITALS — BP 116/70 | HR 62 | Temp 97.2°F | Resp 18

## 2015-06-19 DIAGNOSIS — L02214 Cutaneous abscess of groin: Secondary | ICD-10-CM | POA: Diagnosis not present

## 2015-06-19 DIAGNOSIS — Z95828 Presence of other vascular implants and grafts: Secondary | ICD-10-CM

## 2015-06-19 DIAGNOSIS — T451X5A Adverse effect of antineoplastic and immunosuppressive drugs, initial encounter: Principal | ICD-10-CM

## 2015-06-19 DIAGNOSIS — D6481 Anemia due to antineoplastic chemotherapy: Secondary | ICD-10-CM

## 2015-06-19 DIAGNOSIS — C562 Malignant neoplasm of left ovary: Secondary | ICD-10-CM

## 2015-06-19 DIAGNOSIS — Z452 Encounter for adjustment and management of vascular access device: Secondary | ICD-10-CM

## 2015-06-19 DIAGNOSIS — C569 Malignant neoplasm of unspecified ovary: Secondary | ICD-10-CM

## 2015-06-19 LAB — COMPREHENSIVE METABOLIC PANEL
ALBUMIN: 3.4 g/dL — AB (ref 3.5–5.0)
ALT: 20 U/L (ref 0–55)
AST: 20 U/L (ref 5–34)
Alkaline Phosphatase: 78 U/L (ref 40–150)
Anion Gap: 8 mEq/L (ref 3–11)
BUN: 31 mg/dL — AB (ref 7.0–26.0)
CO2: 27 meq/L (ref 22–29)
Calcium: 8.7 mg/dL (ref 8.4–10.4)
Chloride: 103 mEq/L (ref 98–109)
Creatinine: 1.1 mg/dL (ref 0.6–1.1)
EGFR: 55 mL/min/{1.73_m2} — AB (ref >90)
GLUCOSE: 101 mg/dL (ref 70–140)
Potassium: 3.6 mEq/L (ref 3.5–5.1)
SODIUM: 139 meq/L (ref 136–145)
Total Bilirubin: 0.56 mg/dL (ref 0.20–1.20)
Total Protein: 7.5 g/dL (ref 6.4–8.3)

## 2015-06-19 LAB — CBC WITH DIFFERENTIAL/PLATELET
BASO%: 0.5 % (ref 0.0–2.0)
Basophils Absolute: 0 10*3/uL (ref 0.0–0.1)
EOS ABS: 0 10*3/uL (ref 0.0–0.5)
EOS%: 0.3 % (ref 0.0–7.0)
HCT: 23.8 % — ABNORMAL LOW (ref 34.8–46.6)
HEMOGLOBIN: 7.7 g/dL — AB (ref 11.6–15.9)
LYMPH%: 32.4 % (ref 14.0–49.7)
MCH: 29.3 pg (ref 25.1–34.0)
MCHC: 32.5 g/dL (ref 31.5–36.0)
MCV: 90.2 fL (ref 79.5–101.0)
MONO#: 0.4 10*3/uL (ref 0.1–0.9)
MONO%: 13.2 % (ref 0.0–14.0)
NEUT%: 53.6 % (ref 38.4–76.8)
NEUTROS ABS: 1.7 10*3/uL (ref 1.5–6.5)
Platelets: 84 10*3/uL — ABNORMAL LOW (ref 145–400)
RBC: 2.64 10*6/uL — AB (ref 3.70–5.45)
RDW: 22 % — AB (ref 11.2–14.5)
WBC: 3.2 10*3/uL — AB (ref 3.9–10.3)
lymph#: 1 10*3/uL (ref 0.9–3.3)

## 2015-06-19 LAB — PREPARE RBC (CROSSMATCH)

## 2015-06-19 MED ORDER — ACETAMINOPHEN 325 MG PO TABS
ORAL_TABLET | ORAL | Status: AC
  Filled 2015-06-19: qty 2

## 2015-06-19 MED ORDER — HEPARIN SOD (PORK) LOCK FLUSH 100 UNIT/ML IV SOLN
250.0000 [IU] | INTRAVENOUS | Status: AC | PRN
  Administered 2015-06-19: 250 [IU]
  Filled 2015-06-19: qty 5

## 2015-06-19 MED ORDER — SODIUM CHLORIDE 0.9% FLUSH
10.0000 mL | INTRAVENOUS | Status: AC | PRN
  Administered 2015-06-19: 10 mL
  Filled 2015-06-19: qty 10

## 2015-06-19 MED ORDER — ACETAMINOPHEN 325 MG PO TABS
650.0000 mg | ORAL_TABLET | Freq: Once | ORAL | Status: AC
  Administered 2015-06-19: 650 mg via ORAL

## 2015-06-19 MED ORDER — SODIUM CHLORIDE 0.9% FLUSH
10.0000 mL | INTRAVENOUS | Status: DC | PRN
  Administered 2015-06-19: 10 mL via INTRAVENOUS
  Filled 2015-06-19: qty 10

## 2015-06-19 NOTE — Progress Notes (Signed)
At time of discharge, pt stated she had a place she wanted someone to look at located on her groin.  She states it is a spot like she has had before but "normally when it pops it just has blood and this time it had brown discharge."  Pt states it also has a foul odor.  Pt discharged from infusion room and ambulated over to see Selena Lesser, NP for further evaluation.

## 2015-06-19 NOTE — Progress Notes (Signed)
MEDICAL ONCOLOGY  Chemo held 06-19-15 with Hgb 7.7 and plt 84K, ANC ok at 1.7  Will be transfused 1 or 2 units PRBCs today depending on infusion time availability  POF sent to move chemo to 3-16, already set up to [facility] with lab that day. WIll use on pro neulasta with chemo  Note FOB negative x3 Feb.  Godfrey Pick, MD

## 2015-06-19 NOTE — Assessment & Plan Note (Signed)
Patient states that she has a past history of boils/abscesses.  She states she's had no abscesses since initiating chemotherapy.  However, patient states she did develop a abscesses to the right groin region within this past week; that was very tender with any movement.  She states that the abscess ruptured approximately 2 days ago; and drained some foul-smelling brown fluid.  She states it is feeling much better now.  She denies any recent fevers or chills.  Exam of the right groin region revealed a healing / resolving abscess to the right groin area.  There was no induration, no edema, no erythema, no warmth, and no red streaks.  Patient was advised to continue to keep a close eye on the site.  She was informed to call/return or directly to the emergency department for any worsening symptoms whatsoever.

## 2015-06-19 NOTE — Assessment & Plan Note (Addendum)
Patient received cycle 3 of her carboplatin/Taxol chemotherapy on 05/29/2015.  Chemotherapy was held today due to chemotherapy-induced pancytopenia.  WBC was 3.2, ANC is 1.7, hemoglobin of 7.7, and platelet count was 84.  Patient has received 5 days of Granix 480 mcg injections.  She also received a blood transfusion today secondary to her anemia.  Per notes-the plan is for the patient to receive on pro-Neulasta with her next cycle of chemotherapy.  The plan is for the patient to return on 06/26/2015 for labs, flush, visit, and chemotherapy.

## 2015-06-19 NOTE — Patient Instructions (Signed)

## 2015-06-19 NOTE — Patient Instructions (Signed)

## 2015-06-19 NOTE — Progress Notes (Signed)
SYMPTOM MANAGEMENT CLINIC   HPI: [name] 55 y.o. female diagnosed with ovarian cancer.  Currently undergoing carboplatin/Taxol chemotherapy regimen.  Patient states that she has a past history of boils/abscesses.  She states she's had no abscesses since initiating chemotherapy.  However, patient states she did develop a abscesses to the right groin region within this past week; that was very tender with any movement.  She states that the abscess ruptured approximately 2 days ago; and drained some foul-smelling brown fluid.  She states it is feeling much better now.  She denies any recent fevers or chills.  Exam of the right groin region revealed a healing / resolving abscess to the right groin area.  There was no induration, no edema, no erythema, no warmth, and no red streaks.  Patient was advised to continue to keep a close eye on the site.  She was informed to call/return or directly to the emergency department for any worsening symptoms whatsoever.  HPI  ROS  Past Medical History  Diagnosis Date  . Pyoderma gangrenosum   . Anxiety   . Arthritis   . Hypertension   . GERD (gastroesophageal reflux disease)   . Crohn disease ([place])   . Insomnia   . Asthma   . Melanoma ([place])     rt. dorsal leg    Past Surgical History  Procedure Laterality Date  . Melanoma excision  06/2001    right leg; also removed 2 lymph nodes  . Dilatation & curettage/hysteroscopy with trueclear N/A 08/17/2013    Procedure: DILATATION & CURETTAGE/HYSTEROSCOPY WITH TRUCLEAR;  Surgeon: Luz Lex, MD;  Location: [facility];  Service: Gynecology;  Laterality: N/A;  . Dilation and curettage of uterus    . Abdominal hysterectomy Bilateral 02/11/2015    Procedure: HYSTERECTOMY ABDOMINAL, bilateral salpingo-oophorectomy;  Surgeon: Louretta Shorten, MD;  Location: [facility];  Service: Gynecology;  Laterality: Bilateral;  . Omentectomy N/A 02/11/2015    Procedure: OMENTECTOMY;  Surgeon: Louretta Shorten, MD;  Location: [facility];  Service: Gynecology;  Laterality: N/A;    has CANDIDIASIS OF VULVA AND VAGINA; MORBID OBESITY; Anxiety state; HYPERTENSION; Asthma; GASTROESOPHAGEAL REFLUX DISEASE; CROHN'S DISEASE, SMALL INTESTINE; RECTAL BLEEDING; CELLULITIS AND ABSCESS OF LEG EXCEPT FOOT; PYODERMA GANGRENOSUM; CONTACT DERMATITIS&OTHER ECZEMA DUE UNSPEC CAUSE; ARTHRITIS; EDEMA; OPEN WOUND OF KNEE LEG AND ANKLE COMPLICATED; MALIGNANT MELANOMA, HX OF; Ovarian ca ([place]); Morbid obesity with BMI of 50.0-59.9, adult ([place]); History of melanoma excision; Asthma exacerbation attacks; Crohn's disease without complication ([place]); Umbilical hernia without obstruction and without gangrene; Essential hypertension; Anemia due to antineoplastic chemotherapy; Folliculitis; Leukopenia due to antineoplastic chemotherapy; Hypokalemia; Antineoplastic chemotherapy induced pancytopenia ([place]); Fever; Rash; Diarrhea; Portacath in place; Chemotherapy induced thrombocytopenia; Chemotherapy induced neutropenia ([place]); Noncompliance with therapeutic plan; and Abscess of groin, right on her problem list.    is allergic to rocephin; amoxicillin; asa; butorphanol tartrate; cephalosporins; ciprofloxacin hcl; clindamycin/lincomycin; codeine; effexor; levofloxacin; losartan; moxifloxacin; paxil; latex; and tape.    Medication List       This list is accurate as of: 06/19/15  6:21 PM.  Always use your most recent med list.               ACIDOPHILUS PO  Take 2 capsules by mouth daily.     albuterol 108 (90 Base) MCG/ACT inhaler  Commonly known as:  PROVENTIL HFA;VENTOLIN HFA  Inhale 2 puffs into the lungs every 4 (four) hours as needed for wheezing or shortness of breath.     alprazolam 2 MG tablet  Commonly known as:  XANAX  Take 2 mg by mouth 2 (two) times daily. 1/2 tab AM and 1 tab PM     amLODipine 2.5 MG tablet  Commonly known as:  NORVASC  Take 2.5 mg by mouth daily.     budesonide-formoterol 160-4.5 MCG/ACT inhaler  Commonly known as:   SYMBICORT  Inhale 2 puffs into the lungs 2 (two) times daily.     dexamethasone 4 MG tablet  Commonly known as:  DECADRON  Take 5 tablets with food 12 hours prior to Taxol chemotherapy     diphenhydrAMINE 25 MG tablet  Commonly known as:  BENADRYL  Take 25 mg by mouth every 8 (eight) hours as needed. Reported on 06/12/2015     diphenhydrAMINE 50 MG tablet  Commonly known as:  BENADRYL  Take 0.5 tablets (25 mg total) by mouth at bedtime as needed for itching.     famotidine 20 MG tablet  Commonly known as:  PEPCID  Take 1 tablet (20 mg total) by mouth 2 (two) times daily.     Ferrous Fumarate 324 (106 Fe) MG Tabs tablet  Commonly known as:  HEMOCYTE  Take 1 tablet (106 mg of iron total) by mouth daily.     Fish Oil 1000 MG Caps  Take 2 capsules by mouth daily. Reported on 05/08/2015     fluconazole 150 MG tablet  Commonly known as:  DIFLUCAN  Take 150 mg by mouth as needed. Reported on 05/08/2015     glucosamine-chondroitin 500-400 MG tablet  Take 2 tablets by mouth 2 (two) times daily. Reported on 05/08/2015     hydroxypropyl methylcellulose / hypromellose 2.5 % ophthalmic solution  Commonly known as:  ISOPTO TEARS / GONIOVISC  Place 2 drops into both eyes as needed for dry eyes. Reported on 05/08/2015     ibuprofen 600 MG tablet  Commonly known as:  ADVIL,MOTRIN  Take 1 tablet (600 mg total) by mouth daily as needed (mild pain).     ketoconazole 2 % cream  Commonly known as:  NIZORAL  Apply 1 application topically daily as needed for irritation. Reported on 05/08/2015     levocetirizine 5 MG tablet  Commonly known as:  XYZAL  Take 5 mg by mouth every morning.     lidocaine-prilocaine cream  Commonly known as:  EMLA  Apply to Porta-cath site 1-2 hours prior to access as directed.     LORazepam 0.5 MG tablet  Commonly known as:  ATIVAN  One SL or po every 6 hours as needed for nausea. Will make drowsy. Do not use xanax within 4 hours of lorazepam     montelukast 10 MG  tablet  Commonly known as:  SINGULAIR  Take 10 mg by mouth at bedtime.     nystatin 100000 UNIT/GM Powd  Apply powder to groin twice a day.     ondansetron 8 MG tablet  Commonly known as:  ZOFRAN  Take 1 tablet (8 mg total) by mouth every 8 (eight) hours as needed for nausea or vomiting. Will not make drowsy     orphenadrine 100 MG tablet  Commonly known as:  NORFLEX  Take 100 mg by mouth 2 (two) times daily as needed for mild pain. Reported on 05/08/2015     PAMPRIN MAX PO  Take 2 tablets by mouth daily as needed (pain). Reported on 05/08/2015     potassium chloride SA 20 MEQ tablet  Commonly known as:  K-DUR,KLOR-CON  Take 1 tablet (20 mEq  total) by mouth 2 (two) times daily.     promethazine 25 MG tablet  Commonly known as:  PHENERGAN  Take 25 mg by mouth every 6 (six) hours as needed for nausea. Reported on 05/08/2015     ranitidine 150 MG tablet  Commonly known as:  ZANTAC  Take 150 mg by mouth 2 (two) times daily. Reported on 05/08/2015     sertraline 50 MG tablet  Commonly known as:  ZOLOFT  Take 50 mg by mouth daily.     triamcinolone cream 0.1 %  Commonly known as:  KENALOG  Apply 1 application topically 2 (two) times daily as needed. Reported on 06/12/2015     triamterene-hydrochlorothiazide 75-50 MG tablet  Commonly known as:  MAXZIDE  Take 1 tablet by mouth daily. Reported on 06/12/2015     Vitamin D3 5000 units Tabs  Take 1 tablet by mouth daily. Reported on 06/12/2015     vitamin E 400 UNIT capsule  Take 400 Units by mouth daily. Reported on 06/12/2015         PHYSICAL EXAMINATION  Oncology Vitals 06/19/2015 06/19/2015  Temp 97.2 97.8  Pulse 62 66  Resp 18 18  SpO2 100 100   BP Readings from Last 2 Encounters:  06/19/15 116/70  06/12/15 134/54    Physical Exam  Constitutional: She is oriented to person, place, and time and well-developed, well-nourished, and in no distress.  HENT:  Head: Normocephalic and atraumatic.  Eyes: Conjunctivae and EOM are  normal. Pupils are equal, round, and reactive to light. Right eye exhibits no discharge. Left eye exhibits no discharge. No scleral icterus.  Neck: Normal range of motion.  Pulmonary/Chest: Effort normal. No respiratory distress.  Musculoskeletal: Normal range of motion. She exhibits no edema or tenderness.  Neurological: She is alert and oriented to person, place, and time. Gait normal.  Skin: Skin is warm and dry. Rash noted. No erythema. No pallor.  Scattered, faint rash to face.  Patient feels that this rash is secondary to recent Neupogen injections.  Resolving abscess to the right groin region; with no erythema, edema, warmth, tenderness, or red streaks.  Psychiatric: Affect normal.  Nursing note and vitals reviewed.   LABORATORY DATA:. Clinical Support on 06/19/2015  Component Date Value Ref Range Status  . WBC 06/19/2015 3.2* 3.9 - 10.3 10e3/uL Final  . NEUT# 06/19/2015 1.7  1.5 - 6.5 10e3/uL Final  . HGB 06/19/2015 7.7* 11.6 - 15.9 g/dL Final  . HCT 06/19/2015 23.8* 34.8 - 46.6 % Final  . Platelets 06/19/2015 84* 145 - 400 10e3/uL Final  . MCV 06/19/2015 90.2  79.5 - 101.0 fL Final  . MCH 06/19/2015 29.3  25.1 - 34.0 pg Final  . MCHC 06/19/2015 32.5  31.5 - 36.0 g/dL Final  . RBC 06/19/2015 2.64* 3.70 - 5.45 10e6/uL Final  . RDW 06/19/2015 22.0* 11.2 - 14.5 % Final  . lymph# 06/19/2015 1.0  0.9 - 3.3 10e3/uL Final  . MONO# 06/19/2015 0.4  0.1 - 0.9 10e3/uL Final  . Eosinophils Absolute 06/19/2015 0.0  0.0 - 0.5 10e3/uL Final  . Basophils Absolute 06/19/2015 0.0  0.0 - 0.1 10e3/uL Final  . NEUT% 06/19/2015 53.6  38.4 - 76.8 % Final  . LYMPH% 06/19/2015 32.4  14.0 - 49.7 % Final  . MONO% 06/19/2015 13.2  0.0 - 14.0 % Final  . EOS% 06/19/2015 0.3  0.0 - 7.0 % Final  . BASO% 06/19/2015 0.5  0.0 - 2.0 % Final  . Sodium 06/19/2015  139  136 - 145 mEq/L Final  . Potassium 06/19/2015 3.6  3.5 - 5.1 mEq/L Final  . Chloride 06/19/2015 103  98 - 109 mEq/L Final  . CO2 06/19/2015 27   22 - 29 mEq/L Final  . Glucose 06/19/2015 101  70 - 140 mg/dl Final   Glucose reference range is for nonfasting patients. Fasting glucose reference range is 70- 100.  Marland Kitchen BUN 06/19/2015 31.0* 7.0 - 26.0 mg/dL Final  . Creatinine 06/19/2015 1.1  0.6 - 1.1 mg/dL Final  . Total Bilirubin 06/19/2015 0.56  0.20 - 1.20 mg/dL Final  . Alkaline Phosphatase 06/19/2015 78  40 - 150 U/L Final  . AST 06/19/2015 20  5 - 34 U/L Final  . ALT 06/19/2015 20  0 - 55 U/L Final  . Total Protein 06/19/2015 7.5  6.4 - 8.3 g/dL Final  . Albumin 06/19/2015 3.4* 3.5 - 5.0 g/dL Final  . Calcium 06/19/2015 8.7  8.4 - 10.4 mg/dL Final  . Anion Gap 06/19/2015 8  3 - 11 mEq/L Final  . EGFR 06/19/2015 55* >90 ml/min/1.73 m2 Final   eGFR is calculated using the CKD-EPI Creatinine Equation (2009)  Hospital Outpatient Visit on 06/19/2015  Component Date Value Ref Range Status  . Order Confirmation 06/19/2015 ORDER PROCESSED BY BLOOD BANK   Final  . ABO/RH(D) 06/19/2015 O POS   Final  . Antibody Screen 06/19/2015 NEG   Final  . Sample Expiration 06/19/2015 06/22/2015   Final  . Unit Number 06/19/2015 [id number]   Final  . Blood Component Type 06/19/2015 RED CELLS,LR   Final  . Unit division 06/19/2015 00   Final  . Status of Unit 06/19/2015 ISSUED   Final  . Transfusion Status 06/19/2015 OK TO TRANSFUSE   Final  . Crossmatch Result 06/19/2015 Compatible   Final  . Unit Number 06/19/2015 [id number]   Final  . Blood Component Type 06/19/2015 RED CELLS,LR   Final  . Unit division 06/19/2015 00   Final  . Status of Unit 06/19/2015 ISSUED   Final  . Transfusion Status 06/19/2015 OK TO TRANSFUSE   Final  . Crossmatch Result 06/19/2015 Compatible   Final     RADIOGRAPHIC STUDIES: No results found.  ASSESSMENT/PLAN:    Ovarian ca [facility]) Patient received cycle 3 of her carboplatin/Taxol chemotherapy on 05/29/2015.  Chemotherapy was held today due to chemotherapy-induced pancytopenia.  WBC was 3.2, ANC is 1.7,  hemoglobin of 7.7, and platelet count was 84.  Patient has received 5 days of Granix 480 mcg injections.  She also received a blood transfusion today secondary to her anemia.  Per notes-the plan is for the patient to receive on pro-Neulasta with her next cycle of chemotherapy.  The plan is for the patient to return on 06/26/2015 for labs, flush, visit, and chemotherapy.  Abscess of groin, right Patient states that she has a past history of boils/abscesses.  She states she's had no abscesses since initiating chemotherapy.  However, patient states she did develop a abscesses to the right groin region within this past week; that was very tender with any movement.  She states that the abscess ruptured approximately 2 days ago; and drained some foul-smelling brown fluid.  She states it is feeling much better now.  She denies any recent fevers or chills.  Exam of the right groin region revealed a healing / resolving abscess to the right groin area.  There was no induration, no edema, no erythema, no warmth, and no red streaks.  Patient  was advised to continue to keep a close eye on the site.  She was informed to call/return or directly to the emergency department for any worsening symptoms whatsoever.  Patient stated understanding of all instructions; and was in agreement with this plan of care. The patient knows to call the clinic with any problems, questions or concerns.   Review/collaboration with Dr. Marko Plume regarding all aspects of patient's visit today.   Total time spent with patient was 15 minutes;  with greater than 75 percent of that time spent in face to face counseling regarding patient's symptoms,  and coordination of care and follow up.  Disclaimer:This dictation was prepared with Dragon/digital dictation along with [employer]. Any transcriptional errors that result from this process are unintentional.  Drue Second, NP 06/19/2015

## 2015-06-20 ENCOUNTER — Ambulatory Visit: Payer: Medicare HMO

## 2015-06-20 LAB — TYPE AND SCREEN
ABO/RH(D): O POS
Antibody Screen: NEGATIVE
Unit division: 0
Unit division: 0

## 2015-06-21 ENCOUNTER — Ambulatory Visit: Payer: Medicare HMO

## 2015-06-22 ENCOUNTER — Other Ambulatory Visit: Payer: Self-pay | Admitting: Oncology

## 2015-06-22 DIAGNOSIS — C562 Malignant neoplasm of left ovary: Secondary | ICD-10-CM

## 2015-06-26 ENCOUNTER — Ambulatory Visit: Payer: Medicare HMO

## 2015-06-26 ENCOUNTER — Other Ambulatory Visit (HOSPITAL_BASED_OUTPATIENT_CLINIC_OR_DEPARTMENT_OTHER): Payer: Medicare HMO

## 2015-06-26 ENCOUNTER — Encounter: Payer: Self-pay | Admitting: Oncology

## 2015-06-26 ENCOUNTER — Telehealth: Payer: Self-pay | Admitting: Oncology

## 2015-06-26 ENCOUNTER — Ambulatory Visit (HOSPITAL_BASED_OUTPATIENT_CLINIC_OR_DEPARTMENT_OTHER): Payer: Medicare HMO | Admitting: Oncology

## 2015-06-26 VITALS — BP 143/63 | HR 77 | Temp 97.9°F | Resp 18 | Ht 62.75 in | Wt 288.0 lb

## 2015-06-26 DIAGNOSIS — Z452 Encounter for adjustment and management of vascular access device: Secondary | ICD-10-CM

## 2015-06-26 DIAGNOSIS — D6481 Anemia due to antineoplastic chemotherapy: Secondary | ICD-10-CM | POA: Diagnosis not present

## 2015-06-26 DIAGNOSIS — R1032 Left lower quadrant pain: Secondary | ICD-10-CM

## 2015-06-26 DIAGNOSIS — T451X5A Adverse effect of antineoplastic and immunosuppressive drugs, initial encounter: Secondary | ICD-10-CM

## 2015-06-26 DIAGNOSIS — D701 Agranulocytosis secondary to cancer chemotherapy: Secondary | ICD-10-CM

## 2015-06-26 DIAGNOSIS — D6959 Other secondary thrombocytopenia: Secondary | ICD-10-CM

## 2015-06-26 DIAGNOSIS — C562 Malignant neoplasm of left ovary: Secondary | ICD-10-CM | POA: Diagnosis not present

## 2015-06-26 DIAGNOSIS — Z95828 Presence of other vascular implants and grafts: Secondary | ICD-10-CM

## 2015-06-26 LAB — CBC WITH DIFFERENTIAL/PLATELET
BASO%: 0.3 % (ref 0.0–2.0)
Basophils Absolute: 0 10*3/uL (ref 0.0–0.1)
EOS%: 0.3 % (ref 0.0–7.0)
Eosinophils Absolute: 0 10*3/uL (ref 0.0–0.5)
HCT: 28.2 % — ABNORMAL LOW (ref 34.8–46.6)
HGB: 9.2 g/dL — ABNORMAL LOW (ref 11.6–15.9)
LYMPH%: 34 % (ref 14.0–49.7)
MCH: 29.6 pg (ref 25.1–34.0)
MCHC: 32.6 g/dL (ref 31.5–36.0)
MCV: 90.7 fL (ref 79.5–101.0)
MONO#: 0.4 10*3/uL (ref 0.1–0.9)
MONO%: 12.6 % (ref 0.0–14.0)
NEUT%: 52.8 % (ref 38.4–76.8)
NEUTROS ABS: 1.8 10*3/uL (ref 1.5–6.5)
Platelets: 104 10*3/uL — ABNORMAL LOW (ref 145–400)
RBC: 3.11 10*6/uL — AB (ref 3.70–5.45)
RDW: 20 % — ABNORMAL HIGH (ref 11.2–14.5)
WBC: 3.4 10*3/uL — AB (ref 3.9–10.3)
lymph#: 1.2 10*3/uL (ref 0.9–3.3)

## 2015-06-26 LAB — COMPREHENSIVE METABOLIC PANEL
ALT: 22 U/L (ref 0–55)
ANION GAP: 10 meq/L (ref 3–11)
AST: 21 U/L (ref 5–34)
Albumin: 3.5 g/dL (ref 3.5–5.0)
Alkaline Phosphatase: 69 U/L (ref 40–150)
BUN: 27.3 mg/dL — AB (ref 7.0–26.0)
CHLORIDE: 103 meq/L (ref 98–109)
CO2: 27 meq/L (ref 22–29)
CREATININE: 1.1 mg/dL (ref 0.6–1.1)
Calcium: 9.2 mg/dL (ref 8.4–10.4)
EGFR: 58 mL/min/{1.73_m2} — ABNORMAL LOW (ref >90)
GLUCOSE: 104 mg/dL (ref 70–140)
Potassium: 3.4 mEq/L — ABNORMAL LOW (ref 3.5–5.1)
SODIUM: 140 meq/L (ref 136–145)
Total Bilirubin: 0.76 mg/dL (ref 0.20–1.20)
Total Protein: 8 g/dL (ref 6.4–8.3)

## 2015-06-26 LAB — IRON AND TIBC
%SAT: 35 % (ref 21–57)
IRON: 93 ug/dL (ref 41–142)
TIBC: 264 ug/dL (ref 236–444)
UIBC: 171 ug/dL (ref 120–384)

## 2015-06-26 MED ORDER — SODIUM CHLORIDE 0.9% FLUSH
10.0000 mL | INTRAVENOUS | Status: DC | PRN
  Administered 2015-06-26: 10 mL via INTRAVENOUS
  Filled 2015-06-26: qty 10

## 2015-06-26 MED ORDER — HEPARIN SOD (PORK) LOCK FLUSH 100 UNIT/ML IV SOLN
500.0000 [IU] | Freq: Once | INTRAVENOUS | Status: AC
  Administered 2015-06-26: 500 [IU] via INTRAVENOUS
  Filled 2015-06-26: qty 5

## 2015-06-26 NOTE — Progress Notes (Signed)
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 Patient has had numerous delays and complications including cytopenias since beginning dose dense carbo taxol on 03-26-15. Most recent treatment was  day 1 cycle 3 carbo taxol on 05-29-15 using AUC = 4, delays since then due to neutropenia despite gCSF, anemia requiring PRBCs x2 units on 06-19-15 for hgb 7.7, and thrombocytopenia (84k on day 22). Plan had been to try dose adjusted carbo taxol every 3 weeks with neulasta from here, however counts today are barely into adequate range and she is complaining of intermittent LLQ pain.   Patient describes LLQ pain x3 in last 24 hours, lasting ~ 5 min, no clear aggravating factors, began ~ 2 weeks ago and seems "more sharp" now. She denies associated back pain tho she has chronic back problems. Pain does not seem positional or related to bowels or bladder. She has had no skin rash. She feels soreness with pressure to the area. She has had no fever, tho Tmax occasionally 99 late in day. She has occasional nausea, no recent vomiting. Last BM was last night, normal stool. She had asthma exacerbation last week resolved with meds,  Breathing good now. She has had some facial acne. She denies bleeding. She has some minimal peripheral neuropathy tips of fingers. She denies other abdominal or pelvic pain. No LE swelling. No bleeding or unusual bruising. No problems with PAC. Remainder of 10 point Review of Systems negative.   PAC placed by IR 05-06-15 Refused flu vaccine. No genetics testing, tho would be appropriate CA 125 was 10 preop  ONCOLOGIC HISTORY Patient has been followed closely by Dr Corinna Capra of gyn, including  hysteroscopy D & C 08-2013 for bleeding, with benign endometrial polyp then ([id number]). She had CT at outside facility [phone] because of umbilical hernia, which showed 6 cm left adnexal mass with imaging consistent with ovarian cyst. She had follow up imaging in 12-2014 with cyst 6.9 cm, not complex, and CA 125 normal at 10 on 12-18-14. By repeat US 01-23-15 the area measured 9.7 x 7.0 x 6.4 cm with normal flow to ovary, no flow to cyst and no free fluid, endometrial stripe 3.9 mm; patient was then having some pelvic pressure. She had abdominal hysterectomy BSO by Dr Corinna Capra on 02-11-15, with operative findings of adhesions from left adnexal mass to bowel and pelvic sidewall, no ascites, normal appearing omentum, normal appendix; there was unavoidable intraoperative rupture during surgery. Pathology [id number]) left clear cell ovarian carcinoma at least 7 cy with capsule rupture, benign left tube, right ovary and tube and uterus. Cytology of washings negative for malignancy ([id number]). Post operative course has been uncomplicated. CT CAP 03-07-15 showed no apparent metastatic disease, with hepatic steatosis, abdominal aortic atherosclerosis and some fluid at paraumbilical hernia. Patient saw Dr Denman George in consultation on 02-24-15, with recommendation for 6 cycles carboplatin taxol adjuvantly. She had day 1 cycle 1 dose dense carbo taxol on12-14-16. Day 8 cycle 1 held with ANC 1.3, granix 480 added day after each treatment. D8C2 delayed with neutropenia and other issues, day 8 cycle 2 given 05-15-15. Dose dense regimen was continued thru day 1 cycle 3 on 05-29-15, poorly tolerated including cytopenias. .  Objective:  Vital signs in  last 24 hours:  BP 143/63 mmHg  Pulse 77  Temp(Src) 97.9 F (36.6 C) (Oral)  Resp 18  Ht 5' 2.75" (1.594 m)  Wt 288 lb (130.636 kg)  BMI 51.41 kg/m2  SpO2 98% Weight down 3 lbs Alert, oriented and appropriate. Ambulatory without assistance.  Alopecia  HEENT:PERRL, sclerae not  icteric. Oral mucosa moist without lesions, posterior pharynx clear.  Neck supple. No JVD.  Lymphatics:no cervical,suraclavicular, axillary or inguinal adenopathy Resp: clear to auscultation bilaterally and normal percussion bilaterally Cardio: regular rate and rhythm. No gallop. GI: abdomen obese, soft, somewhat tender in localized area LLQ without any palpable findings, not distended, no appreciable mass or organomegaly. Paraumbilical hernia unchanged. A few bowel sounds. Surgical incision not remarkable. Musculoskeletal/ Extremities: without pitting edema, cords, tenderness Neuro: no significant peripheral neuropathy. Otherwise nonfocal. PSYCH appropriate mood and affect Skin without ecchymosis, petechiae. Residual discolored areas on scalp from previous folliculitis with hair loss. Acne scattered on face.  Portacath-without erythema or tenderness  Lab Results:  Results for orders placed or performed in visit on 06/26/15  CBC with Differential  Result Value Ref Range   WBC 3.4 (L) 3.9 - 10.3 10e3/uL   NEUT# 1.8 1.5 - 6.5 10e3/uL   HGB 9.2 (L) 11.6 - 15.9 g/dL   HCT 28.2 (L) 34.8 - 46.6 %   Platelets 104 (L) 145 - 400 10e3/uL   MCV 90.7 79.5 - 101.0 fL   MCH 29.6 25.1 - 34.0 pg   MCHC 32.6 31.5 - 36.0 g/dL   RBC 3.11 (L) 3.70 - 5.45 10e6/uL   RDW 20.0 (H) 11.2 - 14.5 %   lymph# 1.2 0.9 - 3.3 10e3/uL   MONO# 0.4 0.1 - 0.9 10e3/uL   Eosinophils Absolute 0.0 0.0 - 0.5 10e3/uL   Basophils Absolute 0.0 0.0 - 0.1 10e3/uL   NEUT% 52.8 38.4 - 76.8 %   LYMPH% 34.0 14.0 - 49.7 %   MONO% 12.6 0.0 - 14.0 %   EOS% 0.3 0.0 - 7.0 %   BASO% 0.3 0.0 - 2.0 %  Comprehensive metabolic panel  Result Value Ref Range   Sodium 140 136 - 145 mEq/L   Potassium 3.4 (L) 3.5 - 5.1 mEq/L   Chloride 103 98 - 109 mEq/L   CO2 27 22 - 29 mEq/L   Glucose 104 70 - 140 mg/dl   BUN 27.3 (H) 7.0 - 26.0 mg/dL   Creatinine 1.1 0.6 - 1.1 mg/dL   Total Bilirubin 0.76 0.20 - 1.20 mg/dL   Alkaline Phosphatase 69  40 - 150 U/L   AST 21 5 - 34 U/L   ALT 22 0 - 55 U/L   Total Protein 8.0 6.4 - 8.3 g/dL   Albumin 3.5 3.5 - 5.0 g/dL   Calcium 9.2 8.4 - 10.4 mg/dL   Anion Gap 10 3 - 11 mEq/L   EGFR 58 (L) >90 ml/min/1.73 m2    Hemoccult negative 05-15-15 CA 125 not marker initially Iron 03-20-15 serum iron 41, %sat 14  Studies/Results:  No results found.  Medications: I have reviewed the patient's current medications. Encouraged her to take oral iron as directed.  DISCUSSION Counts barely into range yet to allow treatment, and would certainly drop significantly if this is done. Patient anxious about the LLQ discomfort, etiology not clear, including several possible etiologies that would not be cancer related. We have discussed again that goal of this adjuvant therapy for clear cell ovarian cancer is to decrease chance of recurrence, tho it has been  very difficult for her to tolerate chemotherapy.  At conclusion of discussion, chemo will be held again today and she will have CT AP prior to follow up with labs, next available MD 2 weeks.  Assessment/Plan:   1. 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 Adjuvant carboplatin taxol begun 03-26-15 using dose dense regimen, delays for chemo neutropenia despite gCSF used thus far, and for chemo thrombocytopenia; also required PRBCs for chemo anemia and other factors causing multiple missed treatments and delays. Decided to try every 3 week schedule with on pro neulasta starting cycle 4, at least trying to accomplish adjuvant treatment as best possible, however counts only barely adequate today and new LLQ pain. Hold chemo, CT AP. I will update gyn oncology by this note.  2  Progressive anemia: chemo related + somewhat low iron, poorly compliant with oral iron. Transfused 1 unit PRBCs on 06-05-15 for Hgb 8.0 and  2 units on 06-19-15 for hgb 7.7 3.Chemo neutropenia and chemo thrombocytopenia: despite dose reductions and granix. Platelets only 104 today. 4.Poor peripheral IV access, PAC in (rescheduled x 2 before placement) 5.refused flu vaccine 6.history of crohn's disease/ ileitis, which has not required intervention in past 10 years. Known to [facility]. Changed zofran to compazine due to GI issues 7.history of melanoma right posterior thigh 2003 with 2 sentinel nodes negative. Followed by dermatology 8.umbilical hernia since 2725, stable 9.multiple drug intolerances. EMR notes is able to take Rocephin  10.HTN x ~ 10 years followed now by PCP 11.GERD longstanding and unchanged 12.hepatic steatosis by CT 13. Thoracic and abdominal aortic atherosclerosis by CT 14. History of chronic anxiety and panic attacks 15.morbid obesity, BMI >50 16.multiple drug and antibiotic intolerances. 17.asthma: on regular treatment, exacerbations generally with respiratory illnesses. Has had wheezing after ASA, listed on allergies. 18.mammograms due at [facility] this month, ok to schedule   All questions answered. Time spent 30 min including >50% counseling and coordination of care. CC  PCP and Dr Bartholomew Boards, MD   06/26/2015, 10:13 AM

## 2015-06-26 NOTE — Telephone Encounter (Signed)
cxl chemo for today. Other appt remained the same per 3/16 pof

## 2015-06-27 ENCOUNTER — Other Ambulatory Visit: Payer: Self-pay | Admitting: Oncology

## 2015-06-27 ENCOUNTER — Ambulatory Visit: Payer: Medicare HMO

## 2015-06-27 DIAGNOSIS — J45901 Unspecified asthma with (acute) exacerbation: Secondary | ICD-10-CM

## 2015-06-27 LAB — VITAMIN B12: Vitamin B12: 719 pg/mL (ref 211–946)

## 2015-06-29 ENCOUNTER — Other Ambulatory Visit: Payer: Self-pay | Admitting: Oncology

## 2015-07-03 ENCOUNTER — Other Ambulatory Visit: Payer: Medicare HMO

## 2015-07-03 ENCOUNTER — Ambulatory Visit: Payer: Medicare HMO

## 2015-07-04 ENCOUNTER — Encounter (HOSPITAL_COMMUNITY): Payer: Self-pay

## 2015-07-04 ENCOUNTER — Ambulatory Visit: Payer: Medicare HMO

## 2015-07-04 ENCOUNTER — Ambulatory Visit (HOSPITAL_COMMUNITY)
Admission: RE | Admit: 2015-07-04 | Discharge: 2015-07-04 | Disposition: A | Discharge status: Home / Self Care | Payer: Medicare HMO | Source: Ambulatory Visit | Attending: Oncology | Admitting: Oncology

## 2015-07-04 DIAGNOSIS — C562 Malignant neoplasm of left ovary: Secondary | ICD-10-CM | POA: Insufficient documentation

## 2015-07-04 MED ORDER — IOHEXOL 300 MG/ML  SOLN
50.0000 mL | Freq: Once | INTRAMUSCULAR | Status: AC | PRN
  Administered 2015-07-04: 50 mL via ORAL

## 2015-07-04 MED ORDER — IOPAMIDOL (ISOVUE-300) INJECTION 61%
100.0000 mL | Freq: Once | INTRAVENOUS | Status: AC | PRN
  Administered 2015-07-04: 100 mL via INTRAVENOUS

## 2015-07-06 ENCOUNTER — Other Ambulatory Visit: Payer: Self-pay | Admitting: Oncology

## 2015-07-08 ENCOUNTER — Telehealth: Payer: Self-pay

## 2015-07-08 NOTE — Telephone Encounter (Signed)
-----   Message from Gordy Levan, MD sent at 07/06/2015  9:20 PM EDT ----- Prior to 3-30, please let her know that CT did not show any problems that would be causing the left abdominal pain, and otherwise no cancer that we can tell. If her blood counts are ok when I see her on  3-30, we will do chemo that day as planned.    thanks

## 2015-07-08 NOTE — Telephone Encounter (Signed)
lvm per Dr Edwyna Shell attached message. Did review appt times.

## 2015-07-10 ENCOUNTER — Other Ambulatory Visit: Payer: Medicare HMO

## 2015-07-10 ENCOUNTER — Ambulatory Visit: Payer: Medicare HMO | Admitting: Oncology

## 2015-07-10 ENCOUNTER — Encounter: Payer: Self-pay | Admitting: Oncology

## 2015-07-10 ENCOUNTER — Ambulatory Visit: Payer: Medicare HMO

## 2015-07-10 NOTE — Progress Notes (Signed)
Opened in error. FTKA

## 2015-07-11 ENCOUNTER — Telehealth: Payer: Self-pay

## 2015-07-11 NOTE — Telephone Encounter (Signed)
-----   Message from Gordy Levan, MD sent at 07/10/2015  5:45 PM EDT ----- Kaylee Maples MD + chemo on 3-30  If we do not hear from her in next few days, RN please check on her by phone. I doubt we will be able to get any more chemo in, but I will need to see her again and gyn oncology will need a follow up visit.    Will need CBC CMET with next visit, ok to draw from [facility] if she prefers. It would not be wrong for me to see her in next 2-3 weeks with labs, tho if she prefers to see gyn onc instead, we can do it that way  Thank you

## 2015-07-11 NOTE — Telephone Encounter (Signed)
[name] called to scheduling at 57 today stating that she does not have a working phone.  That is why is could not communicate and reschedule appointment yesterday 07-11-15. She is at [facility] using phone but no number left. Will need to call her Monday 4-3 or Tuesday 4-4 to discuss appointment reschedule as noted below. [name] to get a new phone 07-12-15 and keep same phone number.

## 2015-07-15 ENCOUNTER — Telehealth: Payer: Self-pay

## 2015-07-15 NOTE — Telephone Encounter (Signed)
-----   Message from Gordy Levan, MD sent at 07/10/2015  5:45 PM EDT ----- Sherrine Maples MD + chemo on 3-30  If we do not hear from her in next few days, RN please check on her by phone. I doubt we will be able to get any more chemo in, but I will need to see her again and gyn oncology will need a follow up visit.    Will need CBC CMET with next visit, ok to draw from [facility] if she prefers. It would not be wrong for me to see her in next 2-3 weeks with labs, tho if she prefers to see gyn onc instead, we can do it that way  Thank you

## 2015-07-15 NOTE — Telephone Encounter (Signed)
S/w pt and she is still having a phone issue. She is able to come to [facility] on 13th at 330 or 17th at 1130. Phone call transferred to scheduler.

## 2015-07-15 NOTE — Telephone Encounter (Signed)
lvm per Dr Edwyna Shell attached message.

## 2015-07-23 ENCOUNTER — Other Ambulatory Visit: Payer: Self-pay | Admitting: Oncology

## 2015-07-28 ENCOUNTER — Telehealth: Payer: Self-pay | Admitting: Oncology

## 2015-07-28 ENCOUNTER — Telehealth: Payer: Self-pay

## 2015-07-28 ENCOUNTER — Encounter: Payer: Self-pay | Admitting: Oncology

## 2015-07-28 ENCOUNTER — Ambulatory Visit (HOSPITAL_BASED_OUTPATIENT_CLINIC_OR_DEPARTMENT_OTHER): Payer: Medicare HMO

## 2015-07-28 ENCOUNTER — Ambulatory Visit (HOSPITAL_BASED_OUTPATIENT_CLINIC_OR_DEPARTMENT_OTHER): Payer: Medicare HMO | Admitting: Oncology

## 2015-07-28 ENCOUNTER — Other Ambulatory Visit (HOSPITAL_BASED_OUTPATIENT_CLINIC_OR_DEPARTMENT_OTHER): Payer: Medicare HMO

## 2015-07-28 VITALS — BP 129/58 | HR 82 | Temp 98.1°F | Resp 18 | Ht 62.75 in | Wt 284.1 lb

## 2015-07-28 DIAGNOSIS — Z452 Encounter for adjustment and management of vascular access device: Secondary | ICD-10-CM | POA: Diagnosis not present

## 2015-07-28 DIAGNOSIS — C562 Malignant neoplasm of left ovary: Secondary | ICD-10-CM | POA: Diagnosis not present

## 2015-07-28 DIAGNOSIS — Z95828 Presence of other vascular implants and grafts: Secondary | ICD-10-CM

## 2015-07-28 DIAGNOSIS — D701 Agranulocytosis secondary to cancer chemotherapy: Secondary | ICD-10-CM | POA: Diagnosis not present

## 2015-07-28 DIAGNOSIS — D6959 Other secondary thrombocytopenia: Secondary | ICD-10-CM | POA: Diagnosis not present

## 2015-07-28 DIAGNOSIS — D6481 Anemia due to antineoplastic chemotherapy: Secondary | ICD-10-CM | POA: Diagnosis not present

## 2015-07-28 DIAGNOSIS — T451X5A Adverse effect of antineoplastic and immunosuppressive drugs, initial encounter: Secondary | ICD-10-CM

## 2015-07-28 LAB — COMPREHENSIVE METABOLIC PANEL
ALK PHOS: 68 U/L (ref 40–150)
ALT: 18 U/L (ref 0–55)
ANION GAP: 6 meq/L (ref 3–11)
AST: 22 U/L (ref 5–34)
Albumin: 3.3 g/dL — ABNORMAL LOW (ref 3.5–5.0)
BILIRUBIN TOTAL: 0.52 mg/dL (ref 0.20–1.20)
BUN: 26 mg/dL (ref 7.0–26.0)
CO2: 28 mEq/L (ref 22–29)
CREATININE: 1.1 mg/dL (ref 0.6–1.1)
Calcium: 9.2 mg/dL (ref 8.4–10.4)
Chloride: 103 mEq/L (ref 98–109)
EGFR: 58 mL/min/{1.73_m2} — ABNORMAL LOW (ref >90)
Glucose: 100 mg/dl (ref 70–140)
Potassium: 3.6 mEq/L (ref 3.5–5.1)
SODIUM: 138 meq/L (ref 136–145)
TOTAL PROTEIN: 8.7 g/dL — AB (ref 6.4–8.3)

## 2015-07-28 LAB — CBC WITH DIFFERENTIAL/PLATELET
BASO%: 0.6 % (ref 0.0–2.0)
Basophils Absolute: 0 10*3/uL (ref 0.0–0.1)
EOS%: 0.6 % (ref 0.0–7.0)
Eosinophils Absolute: 0 10*3/uL (ref 0.0–0.5)
HCT: 30.1 % — ABNORMAL LOW (ref 34.8–46.6)
HGB: 9.8 g/dL — ABNORMAL LOW (ref 11.6–15.9)
LYMPH%: 26.2 % (ref 14.0–49.7)
MCH: 30.9 pg (ref 25.1–34.0)
MCHC: 32.4 g/dL (ref 31.5–36.0)
MCV: 95.2 fL (ref 79.5–101.0)
MONO#: 0.5 10*3/uL (ref 0.1–0.9)
MONO%: 11.6 % (ref 0.0–14.0)
NEUT%: 61 % (ref 38.4–76.8)
NEUTROS ABS: 2.7 10*3/uL (ref 1.5–6.5)
Platelets: 279 10*3/uL (ref 145–400)
RBC: 3.16 10*6/uL — AB (ref 3.70–5.45)
RDW: 19.8 % — ABNORMAL HIGH (ref 11.2–14.5)
WBC: 4.4 10*3/uL (ref 3.9–10.3)
lymph#: 1.1 10*3/uL (ref 0.9–3.3)

## 2015-07-28 MED ORDER — SODIUM CHLORIDE 0.9% FLUSH
10.0000 mL | INTRAVENOUS | Status: DC | PRN
  Administered 2015-07-28: 10 mL via INTRAVENOUS
  Filled 2015-07-28: qty 10

## 2015-07-28 MED ORDER — HEPARIN SOD (PORK) LOCK FLUSH 100 UNIT/ML IV SOLN
500.0000 [IU] | Freq: Once | INTRAVENOUS | Status: AC
  Administered 2015-07-28: 500 [IU] via INTRAVENOUS
  Filled 2015-07-28: qty 5

## 2015-07-28 MED FILL — SYMBICORT 160-4.5 MCG INH: 160-4.5 | 30 days supply | Qty: 10 | Fill #0

## 2015-07-28 NOTE — Progress Notes (Signed)
OFFICE PROGRESS NOTE   July 30, 2015   Physicians: Everitt Amber, Louretta Shorten, Arlyss Repress, MD (PCP), Madie Reno (general surgery Boykin Nearing), Wilhemina Bonito, Delfin Edis)  INTERVAL HISTORY:  Patient is seen, for first time back at this office since 06-26-15, having [facility] appointment in interim. 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  She had CT AP on 07-04-15 due to complaints of LLQ pain, with no obvious reason for the pain (which has resolved) and no apparent residual or recurrent ovarian cancer.   Patient states that she has felt better gradually in past month, with resolution of the LLQ complaints, some improvement in energy, no nausea and bowels moving regularly. She complains of pain in knees, which she relates to granix in March, but also known degenerative arthritis. She has not tried medication recently prescribed from PCP for knees, does not know what that is. She denies bleeding, increased SOB, active skin problems, fever or symptoms of infection now. She has some LE swelling intermittently. No new or different pain otherwise. No problems with PAC. Remainder of 10 point Review of Systems negative/ unchanged.      PAC placed by IR 05-06-15 Refused flu vaccine. No genetics testing, tho would be appropriate CA 125 was 10 preop  ONCOLOGIC HISTORY Patient has been followed closely by Dr Corinna Capra of gyn, including hysteroscopy D & C 08-2013 for bleeding, with benign endometrial polyp then ([id number]). She had CT at outside facility [id number] because of umbilical hernia, which showed 6 cm left adnexal mass with imaging consistent with ovarian cyst. She had follow up imaging in 12-2014 with cyst 6.9 cm, not complex, and CA 125 normal at 10 on 12-18-14. By repeat US 01-23-15 the  area measured 9.7 x 7.0 x 6.4 cm with normal flow to ovary, no flow to cyst and no free fluid, endometrial stripe 3.9 mm; patient was then having some pelvic pressure. She had abdominal hysterectomy BSO by Dr Corinna Capra on 02-11-15, with operative findings of adhesions from left adnexal mass to bowel and pelvic sidewall, no ascites, normal appearing omentum, normal appendix; there was unavoidable intraoperative rupture during surgery. Pathology [phone]) left clear cell ovarian carcinoma at least 7 cy with capsule rupture, benign left tube, right ovary and tube and uterus. Cytology of washings negative for malignancy ([id number]). Post operative course has been uncomplicated. CT CAP 03-07-15 showed no apparent metastatic disease, with hepatic steatosis, abdominal aortic atherosclerosis and some fluid at paraumbilical hernia. Patient saw Dr Denman George in consultation on 02-24-15, with recommendation for 6 cycles carboplatin taxol adjuvantly. She had day 1 cycle 1 dose dense carbo taxol on12-14-16. Day 8 cycle 1 held with ANC 1.3, granix 480 added day after each treatment. D8C2 delayed with neutropenia and other issues, day 8 cycle 2 given 05-15-15. Dose dense regimen was continued thru day 1 cycle 3 on 05-29-15, poorly tolerated including cytopenias, with multiple delays. CT AP 07-04-15, done with LLQ pain, showed no apparent residual or recurrent gyn malignancy.   Objective:  Vital signs in last 24 hours:  BP 129/58 mmHg  Pulse 82  Temp(Src) 98.1 F (36.7 C) (Oral)  Resp 18  Ht 5' 2.75" (1.594 m)  Wt 284 lb 1.6 oz (128.867 kg)  BMI 50.72 kg/m2  SpO2 95%  LMP  (LMP Unknown) Weight down  4 lbs Alert, oriented and appropriate. Ambulatory slowly with cane.  Alopecia  HEENT:PERRL, sclerae not icteric. Oral mucosa moist without lesions, posterior pharynx clear.  Neck supple. No JVD.  Lymphatics:no cervical,supraclavicular, axillary or appreciable inguinal adenopathy Resp: clear to auscultation  bilaterally Cardio: regular rate and rhythm. No gallop. GI: abdomen obese, soft, nontender, not distended, no appreciable mass or organomegaly. Some bowel sounds. Surgical incision not remarkable. Musculoskeletal/ Extremities: without pitting edema, cords, tenderness Neuro: no significant peripheral neuropathy. Otherwise nonfocal. PSYCH appropriate mood and affect Skin  No active folliculitis on scalp, no rash on face or upper trunk, otherwise without ecchymosis, petechiae Portacath-without erythema or tenderness  Lab Results:  Results for orders placed or performed in visit on 07/28/15  CBC with Differential  Result Value Ref Range   WBC 4.4 3.9 - 10.3 10e3/uL   NEUT# 2.7 1.5 - 6.5 10e3/uL   HGB 9.8 (L) 11.6 - 15.9 g/dL   HCT 30.1 (L) 34.8 - 46.6 %   Platelets 279 145 - 400 10e3/uL   MCV 95.2 79.5 - 101.0 fL   MCH 30.9 25.1 - 34.0 pg   MCHC 32.4 31.5 - 36.0 g/dL   RBC 3.16 (L) 3.70 - 5.45 10e6/uL   RDW 19.8 (H) 11.2 - 14.5 %   lymph# 1.1 0.9 - 3.3 10e3/uL   MONO# 0.5 0.1 - 0.9 10e3/uL   Eosinophils Absolute 0.0 0.0 - 0.5 10e3/uL   Basophils Absolute 0.0 0.0 - 0.1 10e3/uL   NEUT% 61.0 38.4 - 76.8 %   LYMPH% 26.2 14.0 - 49.7 %   MONO% 11.6 0.0 - 14.0 %   EOS% 0.6 0.0 - 7.0 %   BASO% 0.6 0.0 - 2.0 %  Comprehensive metabolic panel  Result Value Ref Range   Sodium 138 136 - 145 mEq/L   Potassium 3.6 3.5 - 5.1 mEq/L   Chloride 103 98 - 109 mEq/L   CO2 28 22 - 29 mEq/L   Glucose 100 70 - 140 mg/dl   BUN 26.0 7.0 - 26.0 mg/dL   Creatinine 1.1 0.6 - 1.1 mg/dL   Total Bilirubin 0.52 0.20 - 1.20 mg/dL   Alkaline Phosphatase 68 40 - 150 U/L   AST 22 5 - 34 U/L   ALT 18 0 - 55 U/L   Total Protein 8.7 (H) 6.4 - 8.3 g/dL   Albumin 3.3 (L) 3.5 - 5.0 g/dL   Calcium 9.2 8.4 - 10.4 mg/dL   Anion Gap 6 3 - 11 mEq/L   EGFR 58 (L) >90 ml/min/1.73 m2    (CA 125 not felt to be useful marker)  Studies/Results: EXAM: CT ABDOMEN AND PELVIS WITH CONTRAST  TECHNIQUE: Multidetector CT  imaging of the abdomen and pelvis was performed using the standard protocol following bolus administration of intravenous contrast.  CONTRAST: 153m ISOVUE-300 IOPAMIDOL (ISOVUE-300) INJECTION 61%  COMPARISON: CT scan 03/07/2015  FINDINGS: Lower chest: The lung bases are clear. No pulmonary nodules or pleural effusion. The heart is normal in size. No pericardial effusion. Moderate-sized hiatal hernia.  Hepatobiliary: No focal hepatic lesions or intrahepatic biliary dilatation. The gallbladder is normal. No common bile duct dilatation.  Pancreas: No mass, inflammation or ductal dilatation.  Spleen: Normal size. No focal lesions.  Adrenals/Urinary Tract: The adrenal glands and kidneys are unremarkable and stable.  Stomach/Bowel: The stomach, duodenum, small bowel and colon are unremarkable. No inflammatory changes, mass lesions or obstructive findings. The terminal ileum is normal. The appendix is normal.  Vascular/Lymphatic: No mesenteric or retroperitoneal mass or  adenopathy. Small scattered lymph nodes are stable. Stable interstitial change in the root of the small bowel mesentery. No evidence of omental caking or peritoneal surface disease.  The aorta and branch vessels are patent. The major venous structures are patent.  Other: The bladder is unremarkable. No pelvic mass or adenopathy. No free pelvic fluid collections. The small fluid collection near the left vaginal cuff has resolved. No inguinal mass or adenopathy. Stable simple fluid collection in the subcutaneous fat near the umbilicus.  Musculoskeletal: No significant bony findings.  IMPRESSION: 1. No CT findings for metastatic disease involving the abdomen/pelvis. 2. No acute abdominal/pelvic findings.  Medications: I have reviewed the patient's current medications. OK to try the arthritis medication from PCP  DISCUSSION   Reviewed information from CT done since she was here last. Reviewed  rationale for adjuvant chemotherapy in this high risk malignancy, however she has no interest in attempting any further chemotherapy as adjuvant treatment.  I have recommended genetics evaluation for this primary ovarian cancer, as that information could allow other options for follow up and directed treatment if needed in future, as well as potentially helpful for other family members. She and husband are in agreement with this referral.   We have discussed keeping PAC for now, as peripheral IV access is extremely difficult. They understand that the [facility] needs to be flushed every 6-8 weeks when not otherwise used.  Discussed benefits of weight loss to ideal from standpoint of gyn and other cancers.   Assessment/Plan:  1. 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 Adjuvant carboplatin taxol begun 03-26-15 using dose dense regimen, delays for chemo neutropenia despite gCSF and for chemo thrombocytopenia; also required PRBCs for chemo anemia and other factors causing multiple missed treatments and delays. She does not want to continue adjuvant chemotherapy, last treatment day 1 cycle 3 on 05-29-15. CT AP 07-04-15 without findings of concern from standpoint of the ovarian cancer. Follow up requested with Dr Denman George, then I will see her back coordinating with PAC flush. Genetics counseling requested.  2 Progressive anemia: chemo related + somewhat low iron, poorly compliant with oral iron. Transfused 1 unit PRBCs on 06-05-15 for Hgb 8.0 and 2 units on 06-19-15 for hgb 7.7. Hgb some better since off chemo, follow 3.Chemo neutropenia and chemo thrombocytopenia: despite dose reductions and granix. Also some better off chemo now, follow 4.Poor peripheral IV access, PAC in (rescheduled x 2 before placement) 5.refused flu vaccine 6.history of crohn's disease/  ileitis, which has not required intervention in past 10 years. Known to [facility]. Changed zofran to compazine due to GI issues 7.history of melanoma right posterior thigh 2003 with 2 sentinel nodes negative. Followed by dermatology 8.umbilical hernia since 6712, stable 9.multiple drug intolerances. EMR notes is able to take Rocephin  10.HTN x ~ 10 years followed now by PCP 11.GERD longstanding and unchanged 12.hepatic steatosis by CT 13. Thoracic and abdominal aortic atherosclerosis by CT 14. History of chronic anxiety and panic attacks 15.morbid obesity, BMI >50. Discussed importance of weight loss to ideal for oncologic concerns 16.multiple drug and antibiotic intolerances. 17.asthma: on regular treatment, exacerbations generally with respiratory illnesses. Has had wheezing after ASA, listed on allergies. 18.mammograms due at [facility] which she agrees to schedule  All questions answered. Time spent 25 min including >50% counseling and coordination of care. Route note to PCP, cc Dr  Bartholomew Boards, MD   07/30/2015, 4:13 PM

## 2015-07-28 NOTE — Telephone Encounter (Signed)
lvm asking if pt is in route to her appt at [facility]. appt at 1130, now 1125.

## 2015-07-28 NOTE — Patient Instructions (Signed)

## 2015-07-28 NOTE — Telephone Encounter (Signed)
appt made and avs printed. Staff message sent for Dr. Denman George appt

## 2015-07-30 DIAGNOSIS — Z95828 Presence of other vascular implants and grafts: Secondary | ICD-10-CM | POA: Insufficient documentation

## 2015-08-01 ENCOUNTER — Telehealth: Payer: Self-pay

## 2015-08-01 NOTE — Telephone Encounter (Signed)
Gave her the appointment time for Sep 01, 2015 for 2:15 pm.  Check in at 1:45 pm Gave [name]'s office number as she wants to try to get a morning appointment with Dr. Denman George.

## 2015-08-07 ENCOUNTER — Telehealth: Payer: Self-pay

## 2015-08-07 NOTE — Telephone Encounter (Signed)
[name] left a message staing that she would like Dr. Marko Plume to make a referral for a  Bone Density Scan and Mammogram.

## 2015-08-13 ENCOUNTER — Telehealth: Payer: Self-pay | Admitting: *Deleted

## 2015-08-13 NOTE — Telephone Encounter (Signed)
25 minute call with patient who reports "I have received great care but with my finances, I can't afford to come to [facility] anymore.  I want to see Dr. Wendee Beavers in [facility] my friend recommended.   I do not receive any chemotherapy or anything.  What do I need to do to obtain my records, do I still need to come there for port-a-cath flushes?  What do I do about appointment with Dr. Denman George?  Insurance may require a referral from my medical doctor.  Do you have the phone number for Dr. Wendee Beavers?   Advised she sign for records through [facility] Which she will do tomorrow. Call transferred to GYN/ONC ext [phone].  She will call Dr. Algis Greenhouse office to determine if she can be followed there and establish before canceling F/U at [facility].  Awaiting to hear from patient with further instructions.

## 2015-08-19 ENCOUNTER — Telehealth: Payer: Self-pay

## 2015-08-19 NOTE — Telephone Encounter (Signed)
Called pt to answer question from 4/27 about why we are not following tumor marker. Dr Edwyna Shell said there was not tumor marker elevated when she presented with her cancer.   Pt is requesting transfer of care to Dr Wendee Beavers for reasons of travel and distance. She wants to wait until after f/u with Dr Denman George on 5/15 for this to take place.  She is still wanting mammogram and bone density scan at [facility]. The mammogram is annual and will not need orders. A bone density would need an order. Would Dr Marko Plume write an order?  lvm at Dr Wendee Beavers asking how to best facilitate this transfer of care. They returned call and stated they would need to receive the referral request and copy of medical records. Their fax is [phone].

## 2015-08-20 ENCOUNTER — Telehealth: Payer: Self-pay | Admitting: *Deleted

## 2015-08-20 NOTE — Telephone Encounter (Signed)
"  I haven't had my teeth cleaned in a long time.  I've completed my chemotherapy.  I have a teeth cleaning scheduling scheduled tomorrow morning.  Can I have my teeth cleaned?"   Last chemotherapy was May 29, 2015.  Labs checked three weeks ago WNL.  Patient denies any changes in status at this time.  Informed her she may have teeth cleaned.  Answered her question as to why teeth cleaning is okay at this time.

## 2015-08-22 ENCOUNTER — Other Ambulatory Visit: Payer: Self-pay

## 2015-08-25 ENCOUNTER — Telehealth: Payer: Self-pay | Admitting: Oncology

## 2015-08-25 ENCOUNTER — Ambulatory Visit: Payer: Medicare HMO

## 2015-08-25 ENCOUNTER — Encounter: Payer: Self-pay | Admitting: Gynecologic Oncology

## 2015-08-25 ENCOUNTER — Ambulatory Visit: Payer: Medicare HMO | Attending: Gynecologic Oncology | Admitting: Gynecologic Oncology

## 2015-08-25 VITALS — BP 115/66 | HR 87 | Temp 98.2°F | Resp 19 | Ht 62.75 in | Wt 280.6 lb

## 2015-08-25 DIAGNOSIS — M1990 Unspecified osteoarthritis, unspecified site: Secondary | ICD-10-CM | POA: Diagnosis not present

## 2015-08-25 DIAGNOSIS — J45909 Unspecified asthma, uncomplicated: Secondary | ICD-10-CM | POA: Insufficient documentation

## 2015-08-25 DIAGNOSIS — F419 Anxiety disorder, unspecified: Secondary | ICD-10-CM | POA: Diagnosis not present

## 2015-08-25 DIAGNOSIS — C569 Malignant neoplasm of unspecified ovary: Secondary | ICD-10-CM

## 2015-08-25 DIAGNOSIS — Z6841 Body Mass Index (BMI) 40.0 and over, adult: Secondary | ICD-10-CM | POA: Diagnosis not present

## 2015-08-25 DIAGNOSIS — K219 Gastro-esophageal reflux disease without esophagitis: Secondary | ICD-10-CM | POA: Diagnosis not present

## 2015-08-25 DIAGNOSIS — Z880 Allergy status to penicillin: Secondary | ICD-10-CM | POA: Insufficient documentation

## 2015-08-25 DIAGNOSIS — I10 Essential (primary) hypertension: Secondary | ICD-10-CM | POA: Insufficient documentation

## 2015-08-25 DIAGNOSIS — Z79899 Other long term (current) drug therapy: Secondary | ICD-10-CM | POA: Diagnosis not present

## 2015-08-25 DIAGNOSIS — D649 Anemia, unspecified: Secondary | ICD-10-CM | POA: Insufficient documentation

## 2015-08-25 DIAGNOSIS — Z7982 Long term (current) use of aspirin: Secondary | ICD-10-CM | POA: Diagnosis not present

## 2015-08-25 DIAGNOSIS — Z9889 Other specified postprocedural states: Secondary | ICD-10-CM | POA: Diagnosis not present

## 2015-08-25 DIAGNOSIS — C562 Malignant neoplasm of left ovary: Secondary | ICD-10-CM | POA: Diagnosis present

## 2015-08-25 DIAGNOSIS — K5090 Crohn's disease, unspecified, without complications: Secondary | ICD-10-CM | POA: Insufficient documentation

## 2015-08-25 DIAGNOSIS — Z85820 Personal history of malignant melanoma of skin: Secondary | ICD-10-CM | POA: Diagnosis not present

## 2015-08-25 NOTE — Telephone Encounter (Signed)
Pt requested record to be printed for herself and faxed to [place] [phone] release id

## 2015-08-25 NOTE — Patient Instructions (Signed)
Plan to follow up with Dr. Denman George in three months or sooner if needed.  Please call for any questions or concerns.

## 2015-08-25 NOTE — Progress Notes (Signed)
Pt scheduled for flush only today, last flush 4/17, next flush 5/24, spoke with pt and discussed accessing ports for flush only and flush schedules and pt agreed to wait until 5/15 for flush  Pt not accessed today

## 2015-08-25 NOTE — Progress Notes (Signed)
Follow-up Note: Gyn-Onc  Consult was initially requested by Dr. Corinna Capra for the evaluation of [name] 55 y.o. 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  CC:  Chief Complaint  Patient presents with  . Left Ovarian Cancer    Follow up    Assessment/Plan:  [name]  is a 55 y.o.  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 Adjuvant chemotherapy from 03/26/15-05/29/15.  Complete clinical response of post-treatment imaging from 07/04/15.  She completed less than 3 full cycles of adjuvant chemotherapy with carboplatin and paclitaxel, due to extreme bone marrow toxicity refractory to granix injections. This has now resolved with her most recent labs showing normal WBC and platelets and mild anemia (9.57m/dl).   I had a 30 minute discussion with [name] about clear cell ovarian cancers, their overall poor prognosis and and the high probability of recurrence after primary treatment. We discussed that while it was not ideal that she did not complete all cycles of chemotherapy, given the apparent stage I nature of her disease, 3 cycles may have been adequate. She will require close surveillance to monitor for recurrence (including examinations with pelvic examination and CA 125 and symptom assessment) every 3 months for 2 years, then 4 montly in the 3rd year, then 6 monthly for 2 additional years. I discussed that there is not a role for routine CT surveillance in the absence of concerning symptoms or exam findings as the sensitivity and specificity for detecting early recurrence is low and this has not been associated with improved survival. I believe It is reasonable to assess CA-125 levels though should be noted that her CA-125 was normal preoperatively and this may not be a good tumor marker for this patient.  We revisited the course of her treatment from surgical decision making with  Dr LCorinna Capra(I explain I could not provide all of the information about this) and postoperative findings and the limitations of treatment with chemotherapy. I discussed that given that her bone marrow appears to have responded she may not benefit much from seeing an oncology specialist in myelodysplastic disorders.  She is interested in seeking follow-up care closer to home and is exploring options at [facility] I informed her regarding symptoms of recurrence and the importance of being seen every 3 months this year. She was made a follow-up appointment with me for August, but will cancel this if she establishes care elsewhere.  HPI: Kaylee Curbowis a 55year old woman who was seen in consultation at the request of Dr LCorinna Caprafor clear cell ovarian cancer.  The patient has a history of abnormal uterine bleeding/symptomatic fibroids. An [place] pelvis in September 2016 showed a 7cm complex cystic mass seen in the left ovary. A repeat [place] October 2016 showed tha the mass had increased to approximately 10cm and was accompanied with normal blood flow and a normal CA 125 (10U/mL on 12/18/14).  She then underwent an ex lap (via Pfannenstiel) TAH, BSO and [facility] [place] At the time of surgery, the large cystic mass was appreciated to be densely adherent to the pelvic sidewalls and in the process of removing it, unavoidable rupture occurred. Of note, pelvic washings taken from before the cyst rupture were negative for malignancy. According to the operative note there was complete resection of the cyst with no residual tumor adhesions  or tissues in the pelvis and none identified in the peritoneal cavity. The omentum was grossly normal, and at the time of frozen section revealing malignancy, a sample from the infracolic omentum was taken and was noted to be negative for malignancy.  Final pathology from the surgery revealed clear cell carcinoma of the  left ovary. The uterus and contralateral tube and ovary were benign. The omentum was negative for malignancy.  She has done well postoperatively with no major morbidities.  The patient has major medical conditions of morbid obesity (BMI 54 kg/m2), crohn's disease, HTN, osteoporosis.  She was first seen by me on 02/24/2015 and at that consultation we recommended 6 cycles of adjuvant carboplatin and paclitaxel chemotherapy after a pretreatment baseline CT scan.  Interval Hx:  CT scan of the chest abdomen and pelvis on 03/07/2015 showed node apparent metastatic disease however it did demonstrate some signs of postoperative changes and fluid collections. She went on to commence adjuvant chemotherapy with Dr. Marko Plume with day 1 of cycle 1 dose dense, but the paclitaxel on 03/26/2015. She had multiple dose delays and reductions through the course of her treatment due to neutropenia thrombocytopenia and anemia. She required Granite's bone marrow stimulation. Her final chemotherapy dose was day 1 of cycle 3 given on every 16th 2017. As the patient was poorly tolerating treatment the patient and her oncologist myometrial decision to discontinue treatment at that point in time.  A post therapeutic CT of the abdomen and pelvis on 07/04/2015 showed no apparent residual or recurrent GYN malignancy. Lab evaluations on 07/22/2015 showed normalization of her white blood cell count 4.4, hemoglobin was 9.8, and platelet count was 279.  Current Meds:  Outpatient Encounter Prescriptions as of 08/25/2015  Medication Sig  . alprazolam (XANAX) 2 MG tablet Take 2 mg by mouth 2 (two) times daily. 1/2 tab AM and 1 tab PM  . amLODipine (NORVASC) 2.5 MG tablet Take 2.5 mg by mouth daily.  . Ascorbic Acid (VITAMIN C) 1000 MG tablet Take 1,000 mg by mouth daily.  . Cholecalciferol (VITAMIN D3) 5000 UNITS TABS Take 1 tablet by mouth daily. Reported on 06/26/2015  . ibuprofen (ADVIL,MOTRIN) 600 MG tablet Take 1 tablet (600 mg  total) by mouth daily as needed (mild pain).  Marland Kitchen ketoconazole (NIZORAL) 2 % cream Apply 1 application topically daily as needed for irritation. Reported on 05/08/2015  . Lactobacillus (ACIDOPHILUS PO) Take 2 capsules by mouth daily.   Marland Kitchen levocetirizine (XYZAL) 5 MG tablet Take 5 mg by mouth every morning.   Marland Kitchen LORazepam (ATIVAN) 0.5 MG tablet One SL or po every 6 hours as needed for nausea. Will make drowsy. Do not use xanax within 4 hours of lorazepam  . nystatin (MYCOSTATIN/NYSTOP) 100000 UNIT/GM POWD Apply powder to groin twice a day.  . Omega-3 Fatty Acids (FISH OIL) 1000 MG CAPS Take 2 capsules by mouth daily. Reported on 06/26/2015  . promethazine (PHENERGAN) 25 MG tablet Take 25 mg by mouth every 6 (six) hours as needed for nausea. Reported on 05/08/2015  . ranitidine (ZANTAC) 150 MG tablet Take 150 mg by mouth 2 (two) times daily. Reported on 05/08/2015  . sertraline (ZOLOFT) 50 MG tablet Take 50 mg by mouth daily.  . SYMBICORT 160-4.5 MCG/ACT inhaler INHALE 2 PUFFS BY MOUTH TWICE DAILY  . triamterene-hydrochlorothiazide (MAXZIDE) 75-50 MG per tablet Take 1 tablet by mouth daily. Reported on 06/12/2015  . vitamin E 400 UNIT capsule Take 400 Units by mouth daily. Reported on 06/26/2015  . albuterol (  PROVENTIL HFA;VENTOLIN HFA) 108 (90 Base) MCG/ACT inhaler Inhale 2 puffs into the lungs every 4 (four) hours as needed for wheezing or shortness of breath. (Patient not taking: Reported on 08/25/2015)  . diphenhydrAMINE (BENADRYL) 50 MG tablet Take 0.5 tablets (25 mg total) by mouth at bedtime as needed for itching. (Patient not taking: Reported on 06/12/2015)  . famotidine (PEPCID) 20 MG tablet Take 1 tablet (20 mg total) by mouth 2 (two) times daily. (Patient not taking: Reported on 06/26/2015)  . Ferrous Fumarate (HEMOCYTE) 324 (106 Fe) MG TABS Take 1 tablet (106 mg of iron total) by mouth daily. (Patient not taking: Reported on 06/12/2015)  . fluconazole (DIFLUCAN) 150 MG tablet Take 150 mg by mouth as  needed. Reported on 08/25/2015  . glucosamine-chondroitin 500-400 MG tablet Take 2 tablets by mouth 2 (two) times daily. Reported on 08/25/2015  . hydroxypropyl methylcellulose / hypromellose (ISOPTO TEARS / GONIOVISC) 2.5 % ophthalmic solution Place 2 drops into both eyes as needed for dry eyes. Reported on 08/25/2015  . lidocaine-prilocaine (EMLA) cream Apply to Porta-cath site 1-2 hours prior to access as directed. (Patient not taking: Reported on 08/25/2015)  . montelukast (SINGULAIR) 10 MG tablet Take 10 mg by mouth at bedtime. Reported on 08/25/2015  . ondansetron (ZOFRAN) 8 MG tablet Take 1 tablet (8 mg total) by mouth every 8 (eight) hours as needed for nausea or vomiting. Will not make drowsy (Patient not taking: Reported on 06/12/2015)  . orphenadrine (NORFLEX) 100 MG tablet Take 100 mg by mouth 2 (two) times daily as needed for mild pain. Reported on 08/25/2015  . triamcinolone cream (KENALOG) 0.1 % Apply 1 application topically 2 (two) times daily as needed. Reported on 08/25/2015  . [DISCONTINUED] Aspirin-Acetaminophen-Caffeine (PAMPRIN MAX PO) Take 2 tablets by mouth daily as needed (pain). Reported on 06/26/2015   No facility-administered encounter medications on file as of 08/25/2015.    Allergy:  Allergies  Allergen Reactions  . Rocephin [Ceftriaxone] Diarrhea    diarrhea  . Amoxicillin     REACTION: Rash with high doses. Can take lower doses like 540m  . Asa [Aspirin]     Asthma exacerbation  . Butorphanol Tartrate     Gave her the shakes for 6 weeks  . Cephalosporins     REACTION: she has tolerated rocephin and Keflex without difficulty  . Ciprofloxacin Hcl Nausea And Vomiting    Can take lower doses like 5093m  . Clindamycin/Lincomycin Other (See Comments)    Tears stomach up.  . Codeine     REACTION: nausea  . Effexor [Venlafaxine] Other (See Comments)    Panic attacks  . Levofloxacin     REACTION: tachycardia  . Losartan Other (See Comments)    Headache, nausea,  fatigue  . Moxifloxacin     REACTION: tachycardia but tolerated cirpofloxacin just fine  . Paxil [Paroxetine Hcl] Other (See Comments)    fatigue  . Latex Rash  . Tape Rash    Social Hx:   Social History   Social History  . Marital Status: Married    Spouse Name: N/A  . Number of Children: 0  . Years of Education: N/A   Occupational History  . disabled    Social History Main Topics  . Smoking status: Never Smoker   . Smokeless tobacco: Never Used  . Alcohol Use: No  . Drug Use: No  . Sexual Activity: Not on file   Other Topics Concern  . Not on file   Social History Narrative  Past Surgical Hx:  Past Surgical History  Procedure Laterality Date  . Melanoma excision  06/2001    right leg; also removed 2 lymph nodes  . Dilatation & curettage/hysteroscopy with trueclear N/A 08/17/2013    Procedure: DILATATION & CURETTAGE/HYSTEROSCOPY WITH TRUCLEAR;  Surgeon: Luz Lex, MD;  Location: [facility];  [name]: Gynecology;  Laterality: N/A;  . Dilation and curettage of uterus    . Abdominal hysterectomy Bilateral 02/11/2015    Procedure: HYSTERECTOMY ABDOMINAL, bilateral salpingo-oophorectomy;  Surgeon: Louretta Shorten, MD;  Location: [facility];  [name]: Gynecology;  Laterality: Bilateral;  . Omentectomy N/A 02/11/2015    Procedure: OMENTECTOMY;  Surgeon: Louretta Shorten, MD;  Location: [facility];  [name]: Gynecology;  Laterality: N/A;    Past Medical Hx:  Past Medical History  Diagnosis Date  . Pyoderma gangrenosum   . Anxiety   . Arthritis   . Hypertension   . GERD (gastroesophageal reflux disease)   . Crohn disease ([place])   . Insomnia   . Asthma   . Melanoma ([place])     rt. dorsal leg    Past Gynecological History:  See HPI  No LMP recorded (lmp unknown). Patient has had a hysterectomy.  Family Hx:  Family History  Problem Relation Age of Onset  . Colon cancer Neg Hx   . Colon polyps Father   . Diabetes Father   . Heart disease Paternal Uncle     Review of  Systems:  Constitutional  Feels well,    ENT Normal appearing ears and nares bilaterally Skin/Breast  No rash, sores, jaundice, itching, dryness Cardiovascular  No chest pain, shortness of breath, or edema  Pulmonary  No cough or wheeze.  Gastro Intestinal  No nausea, vomitting, or diarrhoea. No bright red blood per rectum, no abdominal pain, change in bowel movement, or constipation.  Genito Urinary  No frequency, urgency, dysuria, see HPI Musculo Skeletal  No myalgia, arthralgia, joint swelling or pain  Neurologic  No weakness, numbness, change in gait,  Psychology  No depression, anxiety, insomnia.   Vitals:  Blood pressure 115/66, pulse 87, temperature 98.2 F (36.8 C), temperature source Oral, resp. rate 19, height 5' 2.75" (1.594 m), weight 280 lb 9.6 oz (127.279 kg), SpO2 99 %.  Physical Exam: WD in NAD Neck  Supple NROM, without any enlargements.  Lymph Node Survey No cervical supraclavicular or inguinal adenopathy Cardiovascular  Pulse normal rate, regularity and rhythm. S1 and S2 normal.  Lungs  Clear to auscultation bilateraly, without wheezes/crackles/rhonchi. Good air movement.  Skin  No rash/lesions/breakdown  Psychiatry  Alert and oriented to person, place, and time  Abdomen  Normoactive bowel sounds, abdomen soft, non-tender and obese. Well healed low transverse incision.  Back No CVA tenderness Genito Urinary  Vulva/vagina: Normal external female genitalia.  No lesions. No discharge or bleeding.  Bladder/urethra:  No lesions or masses, well supported bladder  Vagina: grossly normal with in tact cuff.  Cervix: surgically absent  Uterus: surgically absent    Adnexa: no palpable masses. Rectal  Good tone, no masses no cul de sac nodularity.  Extremities  No bilateral cyanosis, clubbing or edema.  30 minutes of face to face counseling time was spent with the patient.  Donaciano Eva, MD  08/25/2015, 2:07 PM

## 2015-09-01 ENCOUNTER — Ambulatory Visit: Payer: Medicare HMO | Admitting: Gynecologic Oncology

## 2015-09-02 ENCOUNTER — Other Ambulatory Visit: Payer: Self-pay | Admitting: Oncology

## 2015-09-02 ENCOUNTER — Telehealth: Payer: Self-pay | Admitting: *Deleted

## 2015-09-02 NOTE — Telephone Encounter (Signed)
1. "I will not be in tomorrow.  I do not drive and my husband can't get me there.  Last port flush was July 28, 2015 so I'll need a flush scheduled before September 22, 2015 so I do not go longer than eight weeks with port-flush.  2. I called [place] to see Dr. Wendee Beavers but need a referral.  Dr. Denman George says this needs to come from my PCP.  Seems like they would need the [facility] information.  I love my care but it's hard to come to [place] financially."   [place] has a Publishing copy in [employer].  She will consider this and call if this is needed.  She has signed and received her records and scans to take to [facility] if needed.  Will notify Genetic counselors for rescheduling.  P.O.F. Generated.   Marland Kitchen

## 2015-09-03 ENCOUNTER — Encounter: Payer: Medicare HMO | Admitting: Genetic Counselor

## 2015-09-03 ENCOUNTER — Telehealth: Payer: Self-pay | Admitting: Oncology

## 2015-09-03 ENCOUNTER — Other Ambulatory Visit: Payer: Medicare HMO

## 2015-09-03 NOTE — Telephone Encounter (Signed)
Left message to inform patient of new flush appt date/time 6/5 at noon

## 2015-09-11 ENCOUNTER — Telehealth: Payer: Self-pay | Admitting: Oncology

## 2015-09-11 NOTE — Telephone Encounter (Signed)
spoke w/ pt confirmed 6/6 apt

## 2015-09-11 NOTE — Telephone Encounter (Signed)
Patient called requesting Dr. Marko Plume send records to Dr. Wendee Beavers in [place] to change services.  Lives in [place], coming to [place] is difficult.    Called office [phone]) for referral form.  Per [place] they do not have a form but need an order copied and faxed with patient's records with cover sheet to [phone].  Will notify Dr. Marko Plume.    Patient also asked for port-a-cath flush appointment to be changed.  P.O.F. Generated for schedulers.

## 2015-09-12 ENCOUNTER — Other Ambulatory Visit: Payer: Self-pay | Admitting: *Deleted

## 2015-09-12 DIAGNOSIS — Z1231 Encounter for screening mammogram for malignant neoplasm of breast: Secondary | ICD-10-CM

## 2015-09-12 DIAGNOSIS — E2839 Other primary ovarian failure: Secondary | ICD-10-CM

## 2015-09-16 ENCOUNTER — Ambulatory Visit (HOSPITAL_BASED_OUTPATIENT_CLINIC_OR_DEPARTMENT_OTHER): Payer: Medicare HMO

## 2015-09-16 VITALS — BP 118/80 | HR 79 | Temp 98.2°F | Resp 20

## 2015-09-16 DIAGNOSIS — C562 Malignant neoplasm of left ovary: Secondary | ICD-10-CM | POA: Diagnosis not present

## 2015-09-16 DIAGNOSIS — Z452 Encounter for adjustment and management of vascular access device: Secondary | ICD-10-CM

## 2015-09-16 DIAGNOSIS — Z95828 Presence of other vascular implants and grafts: Secondary | ICD-10-CM

## 2015-09-16 MED ORDER — SODIUM CHLORIDE 0.9% FLUSH
10.0000 mL | INTRAVENOUS | Status: DC | PRN
  Administered 2015-09-16: 10 mL via INTRAVENOUS
  Filled 2015-09-16: qty 10

## 2015-09-16 MED ORDER — HEPARIN SOD (PORK) LOCK FLUSH 100 UNIT/ML IV SOLN
500.0000 [IU] | Freq: Once | INTRAVENOUS | Status: AC
  Administered 2015-09-16: 500 [IU] via INTRAVENOUS
  Filled 2015-09-16: qty 5

## 2015-09-18 ENCOUNTER — Encounter: Payer: Self-pay | Admitting: Oncology

## 2015-09-22 ENCOUNTER — Other Ambulatory Visit: Payer: Self-pay | Admitting: Oncology

## 2015-09-26 ENCOUNTER — Ambulatory Visit: Payer: Medicare HMO

## 2015-09-26 ENCOUNTER — Other Ambulatory Visit: Payer: Medicare HMO

## 2015-10-01 ENCOUNTER — Other Ambulatory Visit: Payer: Self-pay | Admitting: *Deleted

## 2015-10-01 DIAGNOSIS — C569 Malignant neoplasm of unspecified ovary: Secondary | ICD-10-CM

## 2015-10-01 MED ORDER — IBUPROFEN 600 MG PO TABS: 600.0000 mg | ORAL_TABLET | Freq: Every day | ORAL | Status: DC | PRN

## 2015-10-10 ENCOUNTER — Telehealth: Payer: Self-pay

## 2015-10-10 NOTE — Telephone Encounter (Addendum)
Pt called and stated that she was turned down as a new patient in [employer]. She currently has an appt 7/10 with Dr Marko Plume. She wants to see "what the game plan is" at that time.

## 2015-10-17 ENCOUNTER — Other Ambulatory Visit: Payer: Self-pay

## 2015-10-17 DIAGNOSIS — C569 Malignant neoplasm of unspecified ovary: Secondary | ICD-10-CM

## 2015-10-19 ENCOUNTER — Other Ambulatory Visit: Payer: Self-pay | Admitting: Oncology

## 2015-10-19 DIAGNOSIS — C562 Malignant neoplasm of left ovary: Secondary | ICD-10-CM

## 2015-10-20 ENCOUNTER — Ambulatory Visit (HOSPITAL_BASED_OUTPATIENT_CLINIC_OR_DEPARTMENT_OTHER): Payer: Medicare HMO

## 2015-10-20 ENCOUNTER — Encounter: Payer: Self-pay | Admitting: Oncology

## 2015-10-20 ENCOUNTER — Other Ambulatory Visit (HOSPITAL_BASED_OUTPATIENT_CLINIC_OR_DEPARTMENT_OTHER): Payer: Medicare HMO

## 2015-10-20 ENCOUNTER — Telehealth: Payer: Self-pay | Admitting: Oncology

## 2015-10-20 ENCOUNTER — Ambulatory Visit (HOSPITAL_BASED_OUTPATIENT_CLINIC_OR_DEPARTMENT_OTHER): Payer: Medicare HMO | Admitting: Oncology

## 2015-10-20 VITALS — BP 125/84 | HR 76 | Temp 98.6°F | Resp 18 | Ht 62.75 in | Wt 270.4 lb

## 2015-10-20 DIAGNOSIS — Z95828 Presence of other vascular implants and grafts: Secondary | ICD-10-CM

## 2015-10-20 DIAGNOSIS — K760 Fatty (change of) liver, not elsewhere classified: Secondary | ICD-10-CM

## 2015-10-20 DIAGNOSIS — I10 Essential (primary) hypertension: Secondary | ICD-10-CM

## 2015-10-20 DIAGNOSIS — C569 Malignant neoplasm of unspecified ovary: Secondary | ICD-10-CM

## 2015-10-20 DIAGNOSIS — Z6841 Body Mass Index (BMI) 40.0 and over, adult: Secondary | ICD-10-CM

## 2015-10-20 DIAGNOSIS — C562 Malignant neoplasm of left ovary: Secondary | ICD-10-CM

## 2015-10-20 DIAGNOSIS — Z452 Encounter for adjustment and management of vascular access device: Secondary | ICD-10-CM | POA: Diagnosis not present

## 2015-10-20 DIAGNOSIS — Z85820 Personal history of malignant melanoma of skin: Secondary | ICD-10-CM

## 2015-10-20 DIAGNOSIS — D6481 Anemia due to antineoplastic chemotherapy: Secondary | ICD-10-CM

## 2015-10-20 LAB — COMPREHENSIVE METABOLIC PANEL
ALBUMIN: 3.5 g/dL (ref 3.5–5.0)
ALK PHOS: 73 U/L (ref 40–150)
ALT: 22 U/L (ref 0–55)
AST: 25 U/L (ref 5–34)
Anion Gap: 8 mEq/L (ref 3–11)
BILIRUBIN TOTAL: 0.45 mg/dL (ref 0.20–1.20)
BUN: 20.7 mg/dL (ref 7.0–26.0)
CO2: 28 mEq/L (ref 22–29)
Calcium: 9.6 mg/dL (ref 8.4–10.4)
Chloride: 102 mEq/L (ref 98–109)
Creatinine: 0.9 mg/dL (ref 0.6–1.1)
EGFR: 71 mL/min/{1.73_m2} — ABNORMAL LOW (ref >90)
GLUCOSE: 89 mg/dL (ref 70–140)
Potassium: 3.9 mEq/L (ref 3.5–5.1)
SODIUM: 138 meq/L (ref 136–145)
TOTAL PROTEIN: 8.8 g/dL — AB (ref 6.4–8.3)

## 2015-10-20 LAB — CBC WITH DIFFERENTIAL/PLATELET
BASO%: 0.4 % (ref 0.0–2.0)
Basophils Absolute: 0 10*3/uL (ref 0.0–0.1)
EOS ABS: 0.1 10*3/uL (ref 0.0–0.5)
EOS%: 1.1 % (ref 0.0–7.0)
HEMATOCRIT: 34.6 % — AB (ref 34.8–46.6)
HEMOGLOBIN: 11.4 g/dL — AB (ref 11.6–15.9)
LYMPH#: 1.1 10*3/uL (ref 0.9–3.3)
LYMPH%: 22.3 % (ref 14.0–49.7)
MCH: 28.8 pg (ref 25.1–34.0)
MCHC: 33 g/dL (ref 31.5–36.0)
MCV: 87.1 fL (ref 79.5–101.0)
MONO#: 0.5 10*3/uL (ref 0.1–0.9)
MONO%: 9.7 % (ref 0.0–14.0)
NEUT%: 66.5 % (ref 38.4–76.8)
NEUTROS ABS: 3.2 10*3/uL (ref 1.5–6.5)
Platelets: 244 10*3/uL (ref 145–400)
RBC: 3.97 10*6/uL (ref 3.70–5.45)
RDW: 14.1 % (ref 11.2–14.5)
WBC: 4.9 10*3/uL (ref 3.9–10.3)

## 2015-10-20 MED ORDER — SODIUM CHLORIDE 0.9% FLUSH
10.0000 mL | INTRAVENOUS | Status: DC | PRN
  Administered 2015-10-20: 10 mL via INTRAVENOUS
  Filled 2015-10-20: qty 10

## 2015-10-20 MED ORDER — SODIUM CHLORIDE 0.9 % IJ SOLN
10.0000 mL | INTRAMUSCULAR | Status: DC | PRN
  Filled 2015-10-20: qty 10

## 2015-10-20 MED ORDER — HEPARIN SOD (PORK) LOCK FLUSH 100 UNIT/ML IV SOLN
500.0000 [IU] | Freq: Once | INTRAVENOUS | Status: DC | PRN
  Filled 2015-10-20: qty 5

## 2015-10-20 MED ORDER — HEPARIN SOD (PORK) LOCK FLUSH 100 UNIT/ML IV SOLN
500.0000 [IU] | Freq: Once | INTRAVENOUS | Status: AC
  Administered 2015-10-20: 500 [IU] via INTRAVENOUS
  Filled 2015-10-20: qty 5

## 2015-10-20 NOTE — Telephone Encounter (Signed)
appt made and avs printed °

## 2015-10-20 NOTE — Progress Notes (Signed)
OFFICE PROGRESS NOTE   October 21, 2015   Physicians: Everitt Amber, Louretta Shorten, Arlyss Repress, MD (PCP), Madie Reno (general surgery Boykin Nearing), Wilhemina Bonito, Delfin Edis)  INTERVAL HISTORY:   Patient is seen, together with husband, now requesting to continue care at this office. She is on observation for IC clear cell carcinoma of left ovary since 3 partial cycles of dose dense adjuvant carboplatin taxol given from 03-26-15 thru day 1 cycle 3 on 05-29-15.  Treatment was complicated by multiple delays and cytopenias. Patient had CT AP 07-04-15 with no apparent residual or progressive malignancy. She saw Dr Denman George on 08-25-15, plan for follow up q 3 mo x 2 years, then q 4 mo during year 3, then q 6 months years 4 and 5.   Patient is feeling gradually better since stopping chemotherapy. She tries to walk x 10 min daily and is watching diet including no carbs; she has had intentional weight loss of 14 lbs with these changes since I saw her in April. (Not eligible for GOG 0225 due to stage IA). She denies abdominal or pelvic pain. She has some constipation, not using medication for this. She notices increased hot flashes at night. Appetite is good and she is trying to drink water. No increased SOB with present activity, no problems with PAC, no bleeding, no significant peripheral neuropathy, minimal LE swelling.  Remainder of 10 point Review of Systems negative.    PAC placed by IR 05-06-15 Refused flu vaccine fall 2016 No genetics testing. Discussed, patient in agreement with referral to genetics counselors, prefers coordinating with another visit at this office CA 125 was 10 preop  ONCOLOGIC HISTORY Patient has been followed closely by Dr Corinna Capra of gyn, including hysteroscopy D & C 08-2013 for bleeding, with benign endometrial polyp then ([id number]). She had CT at outside facility [id number] because of umbilical hernia, which showed 6 cm left adnexal mass with imaging consistent with ovarian cyst. She had  follow up imaging in 12-2014 with cyst 6.9 cm, not complex, and CA 125 normal at 10 on 12-18-14. By repeat US 01-23-15 the area measured 9.7 x 7.0 x 6.4 cm with normal flow to ovary, no flow to cyst and no free fluid, endometrial stripe 3.9 mm; patient was then having some pelvic pressure. She had abdominal hysterectomy BSO by Dr Corinna Capra on 02-11-15, with operative findings of adhesions from left adnexal mass to bowel and pelvic sidewall, no ascites, normal appearing omentum, normal appendix; there was unavoidable intraoperative rupture during surgery. Pathology [phone]) left clear cell ovarian carcinoma at least 7 cy with capsule rupture, benign left tube, right ovary and tube and uterus. Cytology of washings negative for malignancy ([id number]). Post operative course has been uncomplicated. CT CAP 03-07-15 showed no apparent metastatic disease, with hepatic steatosis, abdominal aortic atherosclerosis and some fluid at paraumbilical hernia. Patient saw Dr Denman George in consultation on 02-24-15, with recommendation for 6 cycles carboplatin taxol adjuvantly. She had day 1 cycle 1 dose dense carbo taxol on12-14-16. Day 8 cycle 1 held with ANC 1.3, granix 480 added day after each treatment. D8C2 delayed with neutropenia and other issues, day 8 cycle 2 given 05-15-15. Dose dense regimen was continued thru day 1 cycle 3 on 05-29-15, poorly tolerated including cytopenias, with multiple delays. CT AP 07-04-15, done with LLQ pain, showed no apparent residual or recurrent gyn malignancy.    Objective:  Vital signs in last 24 hours: Weight down 14 lbs from 07-30-15, to 270 now. BMI 48.4. 125/84, 76 regular, 18  not labored 98.6, 97%.  Alert, oriented and appropriate. More easily mobile today, not using WC, able to get on and off exam table with assistance of 1.   HEENT:PERRL, sclerae not icteric. Oral mucosa moist without lesions, posterior pharynx clear.  Neck supple. No JVD.  Lymphatics:no cervical,supraclavicular, axillary  or inguinal adenopathy Resp: clear to auscultation bilaterally and normal percussion bilaterally Cardio: regular rate and rhythm. No gallop. GI: abdomen obese, soft, nontender, not distended, no appreciable mass or organomegaly. Normally active bowel sounds. Surgical incision not remarkable. Musculoskeletal/ Extremities: without pitting edema, cords, tenderness Neuro: no peripheral neuropathy. Otherwise nonfocal. PSYCH appropriate mood and affect Skin acneiform lesions on chin, otherwise without rash, ecchymosis, petechiae Portacath-without erythema or tenderness  Lab Results:  Results for orders placed or performed in visit on 10/20/15  CBC with Differential  Result Value Ref Range   WBC 4.9 3.9 - 10.3 10e3/uL   NEUT# 3.2 1.5 - 6.5 10e3/uL   HGB 11.4 (L) 11.6 - 15.9 g/dL   HCT 34.6 (L) 34.8 - 46.6 %   Platelets 244 145 - 400 10e3/uL   MCV 87.1 79.5 - 101.0 fL   MCH 28.8 25.1 - 34.0 pg   MCHC 33.0 31.5 - 36.0 g/dL   RBC 3.97 3.70 - 5.45 10e6/uL   RDW 14.1 11.2 - 14.5 %   lymph# 1.1 0.9 - 3.3 10e3/uL   MONO# 0.5 0.1 - 0.9 10e3/uL   Eosinophils Absolute 0.1 0.0 - 0.5 10e3/uL   Basophils Absolute 0.0 0.0 - 0.1 10e3/uL   NEUT% 66.5 38.4 - 76.8 %   LYMPH% 22.3 14.0 - 49.7 %   MONO% 9.7 0.0 - 14.0 %   EOS% 1.1 0.0 - 7.0 %   BASO% 0.4 0.0 - 2.0 %  Comprehensive metabolic panel  Result Value Ref Range   Sodium 138 136 - 145 mEq/L   Potassium 3.9 3.5 - 5.1 mEq/L   Chloride 102 98 - 109 mEq/L   CO2 28 22 - 29 mEq/L   Glucose 89 70 - 140 mg/dl   BUN 20.7 7.0 - 26.0 mg/dL   Creatinine 0.9 0.6 - 1.1 mg/dL   Total Bilirubin 0.45 0.20 - 1.20 mg/dL   Alkaline Phosphatase 73 40 - 150 U/L   AST 25 5 - 34 U/L   ALT 22 0 - 55 U/L   Total Protein 8.8 (H) 6.4 - 8.3 g/dL   Albumin 3.5 3.5 - 5.0 g/dL   Calcium 9.6 8.4 - 10.4 mg/dL   Anion Gap 8 3 - 11 mEq/L   EGFR 71 (L) >90 ml/min/1.73 m2  CA 125  Result Value Ref Range   Cancer Antigen (CA) 125 8.8 0.0 - 38.1 U/mL    Improvement in  CBC discussed at time of visit, will let her know CA 125 as this resulted after visit  Studies/Results:  No results found.  Medications: I have reviewed the patient's current medications.  DISCUSSION Patient and husband understand that she may not have had optimal adjuvant chemotherapy, but are hopeful that amount that she was able to take will have given benefit against the cancer.  Clinically no findings of concern for active gyn malignancy, tho exam limited by morbid obesity. Encouraged her to continue weight loss efforts, suggested that she try to walk x 10 min bid now. Will keep PAC for now due to extremely difficult peripheral access.  Discussed usefulness of genetics information for patient herself, and she is in agreement with referral for genetics counseling.  Assessment/Plan:  1. 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 Adjuvant carboplatin taxol begun 03-26-15 using dose dense regimen, delays for chemo neutropenia despite gCSF and for chemo thrombocytopenia; also required PRBCs for chemo anemia and other factors causing multiple missed treatments and delays. Chemotherapy discontinued at patient's request after day 1 cycle 3 on 05-29-15. CT AP 07-04-15 without findings of concern from standpoint of the ovarian cancer. Followup with Dr Denman George, then I will see her back coordinating with PAC flush. Genetics counseling requested.  2 chemo anemia improving 3.Chemo neutropenia and chemo thrombocytopenia: resolved 4.Poor peripheral IV access, PAC in which she prefers to keep for now, needs flush every 6-8 weeks 5.Morbid obesity and severe deconditioning: intentional weight loss with increased activity and diet adjustments, encouraged her to continue.  6.history of crohn's disease/ ileitis, which has not required intervention in past 10  years. Known to [facility].  7.history of melanoma right posterior thigh 2003 with 2 sentinel nodes negative. Followed by dermatology 8.umbilical hernia since 3202, stable 9.multiple drug intolerances. EMR notes is able to take Rocephin  10.HTN x ~ 10 years followed now by PCP 11.GERD longstanding and unchanged 12.hepatic steatosis by CT 13. Thoracic and abdominal aortic atherosclerosis by CT 14. History of chronic anxiety and panic attacks 15.asthma: on regular treatment, exacerbations generally with respiratory illnesses. Has had wheezing after ASA, listed on allergies. 16.mammograms overdue, these + DEXA set up at [facility] for 10-28-15    All questions answered. Time spent 25 min including >50% counseling and coordination of care. Route PCP   Gordy Levan, MD   10/21/2015, 2:50 PM

## 2015-10-21 DIAGNOSIS — Z6841 Body Mass Index (BMI) 40.0 and over, adult: Secondary | ICD-10-CM

## 2015-10-21 LAB — CA 125: Cancer Antigen (CA) 125: 8.8 U/mL (ref 0.0–38.1)

## 2015-10-22 ENCOUNTER — Telehealth: Payer: Self-pay

## 2015-10-22 NOTE — Telephone Encounter (Signed)
LM for [name] stating that her CA-125 was in a good low rang as noted below by Dr. Marko Plume.  Marker = 8.8 on 10-20-15.

## 2015-10-22 NOTE — Telephone Encounter (Signed)
-----   Message from Gordy Levan, MD sent at 10/21/2015  7:56 AM EDT ----- Labs seen and need follow up: please let her know marker is in good low range.

## 2015-10-28 ENCOUNTER — Ambulatory Visit
Admission: RE | Admit: 2015-10-28 | Discharge: 2015-10-28 | Disposition: A | Discharge status: Home / Self Care | Payer: Medicare HMO | Source: Ambulatory Visit | Attending: Internal Medicine | Admitting: Internal Medicine

## 2015-10-28 DIAGNOSIS — Z1231 Encounter for screening mammogram for malignant neoplasm of breast: Secondary | ICD-10-CM

## 2015-10-28 DIAGNOSIS — E2839 Other primary ovarian failure: Secondary | ICD-10-CM

## 2015-11-28 ENCOUNTER — Encounter: Payer: Self-pay | Admitting: Genetic Counselor

## 2015-12-01 ENCOUNTER — Ambulatory Visit: Payer: Medicare HMO | Admitting: Gynecologic Oncology

## 2015-12-01 ENCOUNTER — Encounter: Payer: Medicare HMO | Admitting: Genetic Counselor

## 2015-12-01 ENCOUNTER — Other Ambulatory Visit: Payer: Medicare HMO

## 2015-12-02 ENCOUNTER — Ambulatory Visit (HOSPITAL_BASED_OUTPATIENT_CLINIC_OR_DEPARTMENT_OTHER): Payer: Medicare HMO

## 2015-12-02 DIAGNOSIS — C562 Malignant neoplasm of left ovary: Secondary | ICD-10-CM | POA: Diagnosis not present

## 2015-12-02 DIAGNOSIS — Z452 Encounter for adjustment and management of vascular access device: Secondary | ICD-10-CM

## 2015-12-02 DIAGNOSIS — Z95828 Presence of other vascular implants and grafts: Secondary | ICD-10-CM

## 2015-12-02 MED ORDER — HEPARIN SOD (PORK) LOCK FLUSH 100 UNIT/ML IV SOLN
500.0000 [IU] | Freq: Once | INTRAVENOUS | Status: AC | PRN
  Administered 2015-12-02: 500 [IU] via INTRAVENOUS
  Filled 2015-12-02: qty 5

## 2015-12-02 MED ORDER — SODIUM CHLORIDE 0.9 % IJ SOLN
10.0000 mL | INTRAMUSCULAR | Status: DC | PRN
  Administered 2015-12-02: 10 mL via INTRAVENOUS
  Filled 2015-12-02: qty 10

## 2015-12-02 NOTE — Patient Instructions (Signed)

## 2015-12-03 ENCOUNTER — Telehealth: Payer: Self-pay | Admitting: *Deleted

## 2015-12-03 NOTE — Telephone Encounter (Signed)
Left message on pt's  Voicemail. Pt has a future appointment with Dr. Denman George  12/19/15 at 2:15

## 2015-12-19 ENCOUNTER — Ambulatory Visit: Payer: Medicare HMO | Attending: Gynecologic Oncology | Admitting: Gynecologic Oncology

## 2015-12-19 ENCOUNTER — Encounter: Payer: Self-pay | Admitting: Gynecologic Oncology

## 2015-12-19 ENCOUNTER — Ambulatory Visit (HOSPITAL_BASED_OUTPATIENT_CLINIC_OR_DEPARTMENT_OTHER): Payer: Medicare HMO

## 2015-12-19 ENCOUNTER — Other Ambulatory Visit: Payer: Medicare HMO

## 2015-12-19 VITALS — BP 121/64 | HR 75 | Temp 97.9°F | Resp 18 | Ht 62.75 in | Wt 267.6 lb

## 2015-12-19 DIAGNOSIS — Z880 Allergy status to penicillin: Secondary | ICD-10-CM | POA: Insufficient documentation

## 2015-12-19 DIAGNOSIS — Z452 Encounter for adjustment and management of vascular access device: Secondary | ICD-10-CM

## 2015-12-19 DIAGNOSIS — I10 Essential (primary) hypertension: Secondary | ICD-10-CM | POA: Insufficient documentation

## 2015-12-19 DIAGNOSIS — K5090 Crohn's disease, unspecified, without complications: Secondary | ICD-10-CM | POA: Diagnosis not present

## 2015-12-19 DIAGNOSIS — Z888 Allergy status to other drugs, medicaments and biological substances status: Secondary | ICD-10-CM | POA: Insufficient documentation

## 2015-12-19 DIAGNOSIS — Z8249 Family history of ischemic heart disease and other diseases of the circulatory system: Secondary | ICD-10-CM | POA: Insufficient documentation

## 2015-12-19 DIAGNOSIS — K219 Gastro-esophageal reflux disease without esophagitis: Secondary | ICD-10-CM | POA: Insufficient documentation

## 2015-12-19 DIAGNOSIS — Z885 Allergy status to narcotic agent status: Secondary | ICD-10-CM | POA: Diagnosis not present

## 2015-12-19 DIAGNOSIS — C562 Malignant neoplasm of left ovary: Secondary | ICD-10-CM

## 2015-12-19 DIAGNOSIS — F419 Anxiety disorder, unspecified: Secondary | ICD-10-CM | POA: Diagnosis not present

## 2015-12-19 DIAGNOSIS — L88 Pyoderma gangrenosum: Secondary | ICD-10-CM | POA: Insufficient documentation

## 2015-12-19 DIAGNOSIS — Z8543 Personal history of malignant neoplasm of ovary: Secondary | ICD-10-CM

## 2015-12-19 DIAGNOSIS — Z9109 Other allergy status, other than to drugs and biological substances: Secondary | ICD-10-CM | POA: Diagnosis not present

## 2015-12-19 DIAGNOSIS — Z85820 Personal history of malignant melanoma of skin: Secondary | ICD-10-CM | POA: Diagnosis not present

## 2015-12-19 DIAGNOSIS — Z6841 Body Mass Index (BMI) 40.0 and over, adult: Secondary | ICD-10-CM | POA: Insufficient documentation

## 2015-12-19 DIAGNOSIS — Z91040 Latex allergy status: Secondary | ICD-10-CM | POA: Diagnosis not present

## 2015-12-19 DIAGNOSIS — G4700 Insomnia, unspecified: Secondary | ICD-10-CM | POA: Insufficient documentation

## 2015-12-19 DIAGNOSIS — Z800 Family history of malignant neoplasm of digestive organs: Secondary | ICD-10-CM | POA: Insufficient documentation

## 2015-12-19 DIAGNOSIS — Z833 Family history of diabetes mellitus: Secondary | ICD-10-CM | POA: Insufficient documentation

## 2015-12-19 DIAGNOSIS — K429 Umbilical hernia without obstruction or gangrene: Secondary | ICD-10-CM | POA: Insufficient documentation

## 2015-12-19 DIAGNOSIS — Z90710 Acquired absence of both cervix and uterus: Secondary | ICD-10-CM | POA: Insufficient documentation

## 2015-12-19 DIAGNOSIS — Z90722 Acquired absence of ovaries, bilateral: Secondary | ICD-10-CM | POA: Diagnosis not present

## 2015-12-19 DIAGNOSIS — Z8371 Family history of colonic polyps: Secondary | ICD-10-CM | POA: Insufficient documentation

## 2015-12-19 DIAGNOSIS — Z9221 Personal history of antineoplastic chemotherapy: Secondary | ICD-10-CM | POA: Insufficient documentation

## 2015-12-19 DIAGNOSIS — M1990 Unspecified osteoarthritis, unspecified site: Secondary | ICD-10-CM | POA: Insufficient documentation

## 2015-12-19 DIAGNOSIS — Z881 Allergy status to other antibiotic agents status: Secondary | ICD-10-CM | POA: Diagnosis not present

## 2015-12-19 DIAGNOSIS — Z886 Allergy status to analgesic agent status: Secondary | ICD-10-CM | POA: Diagnosis not present

## 2015-12-19 DIAGNOSIS — C569 Malignant neoplasm of unspecified ovary: Secondary | ICD-10-CM

## 2015-12-19 DIAGNOSIS — Z95828 Presence of other vascular implants and grafts: Secondary | ICD-10-CM

## 2015-12-19 MED ORDER — HEPARIN SOD (PORK) LOCK FLUSH 100 UNIT/ML IV SOLN
500.0000 [IU] | Freq: Once | INTRAVENOUS | Status: AC | PRN
  Administered 2015-12-19: 500 [IU] via INTRAVENOUS
  Filled 2015-12-19: qty 5

## 2015-12-19 MED ORDER — SODIUM CHLORIDE 0.9 % IJ SOLN
10.0000 mL | INTRAMUSCULAR | Status: DC | PRN
  Administered 2015-12-19: 10 mL via INTRAVENOUS
  Filled 2015-12-19: qty 10

## 2015-12-19 NOTE — Patient Instructions (Signed)

## 2015-12-19 NOTE — Progress Notes (Signed)
Follow-up Note: Gyn-Onc  Consult was initially requested by Dr. Corinna Capra for the evaluation of [name] 55 y.o. 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  CC:  No chief complaint on file.   Assessment/Plan:  [name]  is a 55 y.o.  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 Adjuvant chemotherapy from 03/26/15-05/29/15.  Complete clinical response of post-treatment imaging from 07/04/15.  She completed less than 3 full cycles of adjuvant chemotherapy with carboplatin and paclitaxel, due to extreme bone marrow toxicity refractory to granix injections. This has now resolved with her most recent labs showing normal WBC and platelets and mild anemia (9.53m/dl).   She has no evidence of recurrence on exam. CA 125 pending  She will see Dr LMarko Plumein 3 months and myself in 665month  She has an asymptomatic umbilical hernia. I would not recommend repair within 12 months of completing therapy (February 2018).  HPI: [name] a [age]ear old woman who was seen in consultation at the request of Dr LoCorinna Capraor clear cell ovarian cancer.  The patient has a history of abnormal uterine bleeding/symptomatic fibroids. An US[place]ore[place] the pelvis in September 2016 showed a 7cm complex cystic mass seen in the left ovary. A repeat US[place]orean October 2016 showed tha the mass had increased to approximately 10cm and was accompanied with normal blood flow and a normal CA 125 (10U/mL on 12/18/14).  She then underwent an ex lap (via Pfannenstiel) TAH, BSO and partial omentectomy on 02/11/15 with [facility] the time of surgery, the large cystic mass was appreciated to be densely adherent to the pelvic sidewalls and in the process of removing it, unavoidable rupture occurred. Of note, pelvic washings taken from before the cyst rupture were negative for malignancy. According to the operative note  there was complete resection of the cyst with no residual tumor adhesions or tissues in the pelvis and none identified in the peritoneal cavity. The omentum was grossly normal, and at the time of frozen section revealing malignancy, a sample from the infracolic omentum was taken and was noted to be negative for malignancy.  Final pathology from the surgery revealed clear cell carcinoma of the left ovary. The uterus and contralateral tube and ovary were benign. The omentum was negative for malignancy.  She has done well postoperatively with no major morbidities.  The patient has major medical conditions of morbid obesity (BMI 54 kg/m2), crohn's disease, HTN, osteoporosis.  She was first seen by me on 02/24/2015 and at that consultation we recommended 6 cycles of adjuvant carboplatin and paclitaxel chemotherapy after a pretreatment baseline CT scan.  CT scan of the chest abdomen and pelvis on 03/07/2015 showed node apparent metastatic disease however it did demonstrate some signs of postoperative changes and fluid collections. She went on to commence adjuvant chemotherapy with Dr. LiMarko Plumeith day 1 of cycle 1 dose dense, but the paclitaxel on 03/26/2015. She had multiple dose delays and reductions through the course of her treatment due to neutropenia thrombocytopenia and anemia. She required Granix bone marrow stimulation. Her final chemotherapy dose was day 1 of cycle 3 given on every 16th 2017. As the patient was poorly tolerating treatment the patient and her oncologist myometrial decision to discontinue treatment at that point in time.  A post therapeutic CT of the abdomen and pelvis on 07/04/2015 showed no apparent residual or recurrent  GYN malignancy. Lab evaluations on 07/22/2015 showed normalization of her white blood cell count 4.4, hemoglobin was 9.8, and platelet count was 279.  Interval Hx:  CA 125 on 10/17/15 was normal at 8 She has no symptoms concerning for recurrence. 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 She has an asymptomatic umbilical hernia.  Current Meds:  Outpatient Encounter Prescriptions as of 12/19/2015  Medication Sig  . albuterol (PROVENTIL HFA;VENTOLIN HFA) 108 (90 Base) MCG/ACT inhaler Inhale 2 puffs into the lungs every 4 (four) hours as needed for wheezing or shortness of breath.  . alprazolam (XANAX) 2 MG tablet Take 2 mg by mouth 2 (two) times daily. 1/2 tab AM and 1 tab PM  . amLODipine (NORVASC) 2.5 MG tablet Take 2.5 mg by mouth daily.  . Ascorbic Acid (VITAMIN C) 1000 MG tablet Take 1,000 mg by mouth daily.  . Cholecalciferol (VITAMIN D3) 5000 UNITS TABS Take 1 tablet by mouth daily. Reported on 06/26/2015  . diphenhydrAMINE (BENADRYL) 50 MG tablet Take 0.5 tablets (25 mg total) by mouth at bedtime as needed for itching. (Patient not taking: Reported on 06/12/2015)  . famotidine (PEPCID) 20 MG tablet Take 1 tablet (20 mg total) by mouth 2 (two) times daily.  . Ferrous Fumarate (HEMOCYTE) 324 (106 Fe) MG TABS Take 1 tablet (106 mg of iron total) by mouth daily.  . fluconazole (DIFLUCAN) 150 MG tablet Take 150 mg by mouth as needed. Reported on 10/20/2015  . glucosamine-chondroitin 500-400 MG tablet Take 2 tablets by mouth 2 (two) times daily. Reported on 08/25/2015  . hydroxypropyl methylcellulose / hypromellose (ISOPTO TEARS / GONIOVISC) 2.5 % ophthalmic solution Place 2 drops into both eyes as needed for dry eyes. Reported on 08/25/2015  . ibuprofen (ADVIL,MOTRIN) 600 MG tablet Take 1 tablet (600 mg total) by mouth daily as needed (mild pain).  Marland Kitchen ketoconazole (NIZORAL) 2 % cream Apply 1 application topically daily as needed for irritation. Reported on 05/08/2015  . Lactobacillus (ACIDOPHILUS PO) Take 2 capsules by mouth daily.   Marland Kitchen levocetirizine (XYZAL) 5 MG tablet Take 5 mg by mouth every morning.   . lidocaine-prilocaine (EMLA) cream Apply to Porta-cath site 1-2 hours prior to access as directed.  Marland Kitchen LORazepam (ATIVAN) 0.5 MG tablet One SL or  po every 6 hours as needed for nausea. Will make drowsy. Do not use xanax within 4 hours of lorazepam (Patient not taking: Reported on 10/20/2015)  . meloxicam (MOBIC) 7.5 MG tablet Take 7.5 mg by mouth daily.  . montelukast (SINGULAIR) 10 MG tablet Take 10 mg by mouth at bedtime. Reported on 10/20/2015  . nystatin (MYCOSTATIN/NYSTOP) 100000 UNIT/GM POWD Apply powder to groin twice a day.  . Omega-3 Fatty Acids (FISH OIL) 1000 MG CAPS Take 2 capsules by mouth daily. Reported on 06/26/2015  . ondansetron (ZOFRAN) 8 MG tablet Take 1 tablet (8 mg total) by mouth every 8 (eight) hours as needed for nausea or vomiting. Will not make drowsy (Patient not taking: Reported on 06/12/2015)  . orphenadrine (NORFLEX) 100 MG tablet Take 100 mg by mouth 2 (two) times daily as needed for mild pain. Reported on 08/25/2015  . promethazine (PHENERGAN) 25 MG tablet Take 25 mg by mouth every 6 (six) hours as needed for nausea. Reported on 10/20/2015  . ranitidine (ZANTAC) 150 MG tablet Take 150 mg by mouth 2 (two) times daily. Reported on 05/08/2015  . sertraline (ZOLOFT) 50 MG tablet Take 50 mg by mouth daily.  . SYMBICORT 160-4.5 MCG/ACT inhaler INHALE  2 PUFFS BY MOUTH TWICE DAILY  . triamcinolone cream (KENALOG) 0.1 % Apply 1 application topically 2 (two) times daily as needed. Reported on 08/25/2015  . triamterene-hydrochlorothiazide (MAXZIDE) 75-50 MG per tablet Take 1 tablet by mouth daily. Reported on 06/12/2015  . vitamin E 400 UNIT capsule Take 400 Units by mouth daily. Reported on 06/26/2015   No facility-administered encounter medications on file as of 12/19/2015.     Allergy:  Allergies  Allergen Reactions  . Rocephin [Ceftriaxone] Diarrhea    diarrhea  . Amoxicillin     REACTION: Rash with high doses. Can take lower doses like 551m  . Asa [Aspirin]     Asthma exacerbation  . Butorphanol Tartrate     Gave her the shakes for 6 weeks  . Cephalosporins     REACTION: she has tolerated rocephin and Keflex without  difficulty  . Ciprofloxacin Hcl Nausea And Vomiting    Can take lower doses like 5016m  . Clindamycin/Lincomycin Other (See Comments)    Tears stomach up.  . Codeine     REACTION: nausea  . Effexor [Venlafaxine] Other (See Comments)    Panic attacks  . Levofloxacin     REACTION: tachycardia  . Losartan Other (See Comments)    Headache, nausea, fatigue  . Moxifloxacin     REACTION: tachycardia but tolerated cirpofloxacin just fine  . Paxil [Paroxetine Hcl] Other (See Comments)    fatigue  . Latex Rash  . Tape Rash    Social Hx:   Social History   Social History  . Marital status: Married    Spouse name: N/A  . Number of children: 0  . Years of education: N/A   Occupational History  . disabled    Social History Main Topics  . Smoking status: Never Smoker  . Smokeless tobacco: Never Used  . Alcohol use No  . Drug use: No  . Sexual activity: Not on file   Other Topics Concern  . Not on file   Social History Narrative  . No narrative on file    Past Surgical Hx:  Past Surgical History:  Procedure Laterality Date  . ABDOMINAL HYSTERECTOMY Bilateral 02/11/2015   Procedure: HYSTERECTOMY ABDOMINAL, bilateral salpingo-oophorectomy;  Surgeon: DaLouretta ShortenMD;  [facility];  Service: Gynecology;  Laterality: Bilateral;  . DILATATION & CURETTAGE/HYSTEROSCOPY WITH TRUECLEAR N/A 08/17/2013   Procedure: DILATATION & CURETTAGE/HYSTEROSCOPY WITH TRUCLEAR;  Surgeon: DaLuz LexMD;  [facility];  Service: Gynecology;  Laterality: N/A;  . DILATION AND CURETTAGE OF UTERUS    . MELANOMA EXCISION  06/2001   right leg; also removed 2 lymph nodes  . OMENTECTOMY N/A 02/11/2015   Procedure: OMENTECTOMY;  Surgeon: DaLouretta ShortenMD;  [facility];  Service: Gynecology;  Laterality: N/A;    Past Medical Hx:  Past Medical History:  Diagnosis Date  . Anxiety   . Arthritis   . Asthma   . Crohn disease ([place]  . GERD (gastroesophageal reflux disease)   . Hypertension   .  Insomnia   . Melanoma ([place]   rt. dorsal leg  . Pyoderma gangrenosum     Past Gynecological History:  See HPI  No LMP recorded (lmp unknown). Patient has had a hysterectomy.  Family Hx:  Family History  Problem Relation Age of Onset  . Colon polyps Father   . Diabetes Father   . Heart disease Paternal Uncle   . Colon cancer Neg Hx     Review of  Systems:  Constitutional  Feels well,    ENT Normal appearing ears and nares bilaterally Skin/Breast  No rash, sores, jaundice, itching, dryness Cardiovascular  No chest pain, shortness of breath, or edema  Pulmonary  No cough or wheeze.  Gastro Intestinal  No nausea, vomitting, or diarrhoea. No bright red blood per rectum, no abdominal pain, change in bowel movement, or constipation.  Genito Urinary  No frequency, urgency, dysuria, see HPI Musculo Skeletal  No myalgia, arthralgia, joint swelling or pain  Neurologic  No weakness, numbness, change in gait,  Psychology  No depression, anxiety, insomnia.   Vitals:  Blood pressure 121/64, pulse 75, temperature 97.9 F (36.6 C), temperature source Oral, resp. rate 18, height 5' 2.75" (1.594 m), weight 267 lb 9.6 oz (121.4 kg), SpO2 95 %.  Physical Exam: WD in NAD Neck  Supple NROM, without any enlargements.  Lymph Node Survey No cervical supraclavicular or inguinal adenopathy Cardiovascular  Pulse normal rate, regularity and rhythm. S1 and S2 normal.  Lungs  Clear to auscultation bilateraly, without wheezes/crackles/rhonchi. Good air movement.  Skin  No rash/lesions/breakdown  Psychiatry  Alert and oriented to person, place, and time  Abdomen  Normoactive bowel sounds, abdomen soft, non-tender and obese. Well healed low transverse incision.  Soft, reducible umbilical hernia. Back No CVA tenderness Genito Urinary  Vulva/vagina: Normal external female genitalia.  No lesions. No discharge or bleeding.  Bladder/urethra:  No lesions or masses, well supported  bladder  Vagina: grossly normal with in tact cuff.  Cervix: surgically absent  Uterus: surgically absent    Adnexa: no palpable masses. Rectal  Good tone, no masses no cul de sac nodularity.  Extremities  No bilateral cyanosis, clubbing or edema.  30 minutes of face to face counseling time was spent with the patient.  Donaciano Eva, MD  12/19/2015, 2:34 PM

## 2015-12-19 NOTE — Patient Instructions (Signed)
Call our office after your appointment with Dr Marko Plume to schedule your follow up appointment with Dr Everitt Amber . We will call you with the results CA 125 level ,call with any changes , questions or concerns.

## 2015-12-20 ENCOUNTER — Other Ambulatory Visit: Payer: Self-pay | Admitting: Oncology

## 2015-12-20 DIAGNOSIS — C569 Malignant neoplasm of unspecified ovary: Secondary | ICD-10-CM

## 2015-12-20 LAB — CA 125: CANCER ANTIGEN (CA) 125: 9.1 U/mL (ref 0.0–38.1)

## 2015-12-23 ENCOUNTER — Telehealth: Payer: Self-pay

## 2015-12-23 NOTE — Telephone Encounter (Signed)
LM for [name] that Dr. Marko Plume will not refill the Ibuprofen 600 mg tablets.  Kaylee Bawa needs to contack Dr. Louretta Shorten for refills.

## 2015-12-23 NOTE — Telephone Encounter (Signed)
Orders received from [place] to contact the patient to update with results of her CA 125 level of 9.1 ( WNL ). Attempted to contact the patient , no answer , left a detailed message with call back numbers provided if additional questions arise.

## 2016-01-26 ENCOUNTER — Encounter: Payer: Self-pay | Admitting: Genetic Counselor

## 2016-01-26 ENCOUNTER — Other Ambulatory Visit: Payer: Medicare HMO

## 2016-01-26 ENCOUNTER — Ambulatory Visit (HOSPITAL_BASED_OUTPATIENT_CLINIC_OR_DEPARTMENT_OTHER): Payer: Medicare HMO | Admitting: Genetic Counselor

## 2016-01-26 ENCOUNTER — Ambulatory Visit (HOSPITAL_BASED_OUTPATIENT_CLINIC_OR_DEPARTMENT_OTHER): Payer: Medicare HMO

## 2016-01-26 DIAGNOSIS — C562 Malignant neoplasm of left ovary: Secondary | ICD-10-CM | POA: Diagnosis not present

## 2016-01-26 DIAGNOSIS — C4490 Unspecified malignant neoplasm of skin, unspecified: Secondary | ICD-10-CM

## 2016-01-26 DIAGNOSIS — Z95828 Presence of other vascular implants and grafts: Secondary | ICD-10-CM

## 2016-01-26 DIAGNOSIS — Z315 Encounter for genetic counseling: Secondary | ICD-10-CM | POA: Diagnosis not present

## 2016-01-26 MED ORDER — SODIUM CHLORIDE 0.9 % IJ SOLN
10.0000 mL | INTRAMUSCULAR | Status: AC | PRN
  Administered 2016-01-26: 10 mL
  Filled 2016-01-26: qty 10

## 2016-01-26 MED ORDER — HEPARIN SOD (PORK) LOCK FLUSH 100 UNIT/ML IV SOLN
500.0000 [IU] | INTRAVENOUS | Status: AC | PRN
  Administered 2016-01-26: 500 [IU]
  Filled 2016-01-26: qty 5

## 2016-01-26 NOTE — Progress Notes (Signed)
REFERRING PROVIDER: Arlyss Repress, MD No address on file   Evlyn Clines, MD  PRIMARY PROVIDER:  Arlyss Repress, MD  PRIMARY REASON FOR VISIT:  1. Malignant neoplasm of left ovary (HCC)   2. Skin cancer      HISTORY OF PRESENT ILLNESS:   [name], a 55 y.o. female, was seen for a [place] cancer genetics consultation at the request of Dr. Ilene Qua due to a personal history of cancer.  Ms. Borras presents to clinic today to discuss the possibility of a hereditary predisposition to cancer, genetic testing, and to further clarify her future cancer risks, as well as potential cancer risks for family members.   In 2016, at the age of 38, Ms. Moten was diagnosed with clear cell cancer of the left ovary. This was treated with complete hysterectomy and chemotherapy.      CANCER HISTORY:   No history exists.     HORMONAL RISK FACTORS:  Menarche was at age 70.  First live birth at age N/A.  OCP use for approximately 0 years.  Ovaries intact: no.  Hysterectomy: yes.  Menopausal status: postmenopausal.  HRT use: 0 years. Colonoscopy: yes; normal. Mammogram within the last year: yes. Number of breast biopsies: 0. Up to date with pelvic exams:  yes. Any excessive radiation exposure in the past:  no  Past Medical History:  Diagnosis Date  . Anxiety   . Arthritis   . Asthma   . Crohn disease ([place])   . GERD (gastroesophageal reflux disease)   . Hypertension   . Insomnia   . Melanoma ([place])    rt. dorsal leg  . Pyoderma gangrenosum   . Skin cancer 2003   melanoma    Past Surgical History:  Procedure Laterality Date  . ABDOMINAL HYSTERECTOMY Bilateral 02/11/2015   Procedure: HYSTERECTOMY ABDOMINAL, bilateral salpingo-oophorectomy;  Surgeon: Louretta Shorten, MD;  Location: [facility];  Service: Gynecology;  Laterality: Bilateral;  . DILATATION & CURETTAGE/HYSTEROSCOPY WITH TRUECLEAR N/A 08/17/2013   Procedure: DILATATION & CURETTAGE/HYSTEROSCOPY WITH TRUCLEAR;  Surgeon: Luz Lex, MD;  Location: [facility];  Service: Gynecology;  Laterality: N/A;  . DILATION AND CURETTAGE OF UTERUS    . MELANOMA EXCISION  06/2001   right leg; also removed 2 lymph nodes  . OMENTECTOMY N/A 02/11/2015   Procedure: OMENTECTOMY;  Surgeon: Louretta Shorten, MD;  Location: [facility];  Service: Gynecology;  Laterality: N/A;    Social History   Social History  . Marital status: Married    Spouse name: N/A  . Number of children: 0  . Years of education: N/A   Occupational History  . disabled    Social History Main Topics  . Smoking status: Never Smoker  . Smokeless tobacco: Never Used  . Alcohol use No  . Drug use: No  . Sexual activity: Not Asked   Other Topics Concern  . None   Social History Narrative  . None     FAMILY HISTORY:  We obtained a detailed, 4-generation family history.  Significant diagnoses are listed below: Family History  Problem Relation Age of Onset  . Colon polyps Father   . Diabetes Father   . Heart disease Paternal Uncle   . Asthma Mother   . Melanoma Maternal Aunt   . Diabetes Paternal Grandmother   . Alcohol abuse Paternal Grandfather   . Breast cancer Cousin     maternal first cousin  . Colon cancer Neg Hx     The patient does not have children.  She has one sister who is cancer free.  Her mother died of an asthma attack at 84.  She had two brothers and three sisters.  One sister died of skin cancer and one brother had a daughter with breast cancer.  The patient's father had colon polyps and died of complications from an infection.  He had four brothers and one sister, none who had cancer.  Patient's maternal ancestors are of Caucasian descent, and paternal ancestors are of [place] and [place] descent. There is no reported Ashkenazi Jewish ancestry. There is no known consanguinity.  GENETIC COUNSELING ASSESSMENT: [name] is a 55 y.o. female with a personal and family history of cancer which is somewhat suggestive of a hereditary cancer  syndrome and predisposition to cancer. We, therefore, discussed and recommended the following at today's visit.   DISCUSSION: We discussed that about 20% of ovarian cancer is hereditary, most commonly BRCA mutations.  Sometimes we see Lynch syndrome as a cause. Other genes associated with hereditary ovarian cancer include BRIP1, RAD51C, and RAD51D.  We reviewed the characteristics, features and inheritance patterns of hereditary cancer syndromes. There are also genes associated with melanoma, including BAP1, CDKN2A, CDK4 and MITF.  We also discussed genetic testing, including the appropriate family members to test, the process of testing, insurance coverage and turn-around-time for results. We discussed the implications of a negative, positive and/or variant of uncertain significant result. We recommended Ms. Kayleen Memos pursue genetic testing for the Breast/Ovarian cancer panel gene panel and BAP1, CDKN2A, CDK4 and MITF.   Based on [name]'s personal and family history of cancer, she meets medical criteria for genetic testing. Despite that she meets criteria, she may still have an out of pocket cost. We discussed that if her out of pocket cost for testing is over $100, the laboratory will call and confirm whether she wants to proceed with testing.  If the out of pocket cost of testing is less than $100 she will be billed by the genetic testing laboratory.   PLAN: After considering the risks, benefits, and limitations, [name]  provided informed consent to pursue genetic testing and the blood sample was sent to [facility] for analysis of the custom panel looking at the Breast/Ovarian cancer panel and the following melanoma genes: BAP1, CDKN2A, CDK4 and MITF. Results should be available within approximately 2-3 weeks' time, at which point they will be disclosed by telephone to Ms. Kayleen Memos, as will any additional recommendations warranted by these results. [name] will receive a summary of her  genetic counseling visit and a copy of her results once available. This information will also be available in Epic. We encouraged [name] to remain in contact with cancer genetics annually so that we can continuously update the family history and inform her of any changes in cancer genetics and testing that may be of benefit for her family. Ms. Erb questions were answered to her satisfaction today. Our contact information was provided should additional questions or concerns arise.  Lastly, we encouraged Ms. Reyburn to remain in contact with cancer genetics annually so that we can continuously update the family history and inform her of any changes in cancer genetics and testing that may be of benefit for this family.   Ms.  Denio questions were answered to her satisfaction today. Our contact information was provided should additional questions or concerns arise. Thank you for the referral and allowing [place] to share in the care of your patient.   Karen P. Florene Glen,  MS, [facility] Certified Genetic Counselor [email or web address] phone: [phone]  The patient was seen for a total of 60 minutes in face-to-face genetic counseling.  This patient was discussed with Drs. Magrinat, Lindi Adie and/or Burr Medico who agrees with the above.    _______________________________________________________________________ For Office Staff:  Number of people involved in session: 2 Was an Intern/ student involved with case: no

## 2016-02-17 ENCOUNTER — Telehealth: Payer: Self-pay | Admitting: Genetic Counselor

## 2016-02-17 ENCOUNTER — Encounter: Payer: Self-pay | Admitting: Genetic Counselor

## 2016-02-17 DIAGNOSIS — Z1379 Encounter for other screening for genetic and chromosomal anomalies: Secondary | ICD-10-CM

## 2016-02-17 HISTORY — DX: Encounter for other screening for genetic and chromosomal anomalies: Z13.79

## 2016-02-17 NOTE — Telephone Encounter (Signed)
LM on VM with good news on test results.  Asked that she call back.

## 2016-02-20 NOTE — Telephone Encounter (Signed)
Patient and I have been playing phone tag.  I tried calling again, and got VM.  Left good news message on VM and indicated that I would be available between 1:45-3:30 today.

## 2016-02-23 ENCOUNTER — Ambulatory Visit: Payer: Self-pay | Admitting: Genetic Counselor

## 2016-02-23 ENCOUNTER — Encounter: Payer: Self-pay | Admitting: Genetic Counselor

## 2016-02-23 ENCOUNTER — Telehealth: Payer: Self-pay | Admitting: Genetic Counselor

## 2016-02-23 DIAGNOSIS — Z1379 Encounter for other screening for genetic and chromosomal anomalies: Secondary | ICD-10-CM

## 2016-02-23 DIAGNOSIS — C562 Malignant neoplasm of left ovary: Secondary | ICD-10-CM

## 2016-02-23 DIAGNOSIS — C4490 Unspecified malignant neoplasm of skin, unspecified: Secondary | ICD-10-CM

## 2016-02-23 NOTE — Progress Notes (Signed)
HPI: Ms. Benning was previously seen in the [place] clinic due to a personal and family history of cancer and concerns regarding a hereditary predisposition to cancer. Please refer to our prior cancer genetics clinic note for more information regarding Ms. Woon's medical, social and family histories, and our assessment and recommendations, at the time. Ms. Salatino recent genetic test results were disclosed to her, as were recommendations warranted by these results. These results and recommendations are discussed in more detail below.  FAMILY HISTORY:  We obtained a detailed, 4-generation family history.  Significant diagnoses are listed below: Family History  Problem Relation Age of Onset  . Colon polyps Father   . Diabetes Father   . Heart disease Paternal Uncle   . Asthma Mother   . Melanoma Maternal Aunt   . Diabetes Paternal Grandmother   . Alcohol abuse Paternal Grandfather   . Breast cancer Cousin     maternal first cousin  . Colon cancer Neg Hx     The patient does not have children.  She has one sister who is cancer free.  Her mother died of an asthma attack at 50.  She had two brothers and three sisters.  One sister died of skin cancer and one brother had a daughter with breast cancer.  The patient's father had colon polyps and died of complications from an infection.  He had four brothers and one sister, none who had cancer.  Patient's maternal ancestors are of Caucasian descent, and paternal ancestors are of [place] and [place] descent. There is no reported Ashkenazi Jewish ancestry. There is no known consanguinity.  GENETIC TEST RESULTS: At the time of Ms. Whitaker's visit, we recommended she pursue genetic testing of the custom gene panel that looked at melanoma and breast/ovarian cancer genes. The Custom gene panel offered by [facility] includes sequencing and rearrangement analysis for the following 24 genes:  ATM, BAP1, BARD1, BRCA1, BRCA2, BRIP1, CDH1,  CDK4, CDKN2A, CHEK2, EPCAM, FANCC, MITF, MLH1, MSH2, MSH6, NBN, PALB2, PMS2, PTEN, RAD51C, RAD51D, TP53, and XRCC2.  The report date is February 13, 2016.  Genetic testing was normal, and did not reveal a deleterious mutation in these genes. The test report has been scanned into EPIC and is located under the Molecular Pathology section of the Results Review tab.   We discussed with Ms. Griffy that since the current genetic testing is not perfect, it is possible there may be a gene mutation in one of these genes that current testing cannot detect, but that chance is small. We also discussed, that it is possible that another gene that has not yet been discovered, or that we have not yet tested, is responsible for the cancer diagnoses in the family, and it is, therefore, important to remain in touch with cancer genetics in the future so that we can continue to offer Ms. Steinmiller the most up to date genetic testing.   Genetic testing did detect a Variant of Unknown Significance in the BAP1 gene called [id number]. At this time, it is unknown if this variant is associated with increased cancer risk or if this is a normal finding, but most variants such as this get reclassified to being inconsequential. It should not be used to make medical management decisions. With time, we suspect the lab will determine the significance of this variant, if any. If we do learn more about it, we will try to contact Ms. Huguley to discuss it further. However, it is important to stay in  touch with [place] periodically and keep the address and phone number up to date.   CANCER SCREENING RECOMMENDATIONS:  This result is reassuring and indicates that Ms. Oriol likely does not have an increased risk for a future cancer due to a mutation in one of these genes. This normal test also suggests that Ms. Boran's cancer was most likely not due to an inherited predisposition associated with one of these genes.  Most cancers happen by chance and  this negative test suggests that her cancer falls into this category.  We, therefore, recommended she continue to follow the cancer management and screening guidelines provided by her oncology and primary healthcare provider.   RECOMMENDATIONS FOR FAMILY MEMBERS: Women in this family might be at some increased risk of developing cancer, over the general population risk, simply due to the family history of cancer. We recommended women in this family have a yearly mammogram beginning at age 76, or 16 years younger than the earliest onset of cancer, an annual clinical breast exam, and perform monthly breast self-exams. Women in this family should also have a gynecological exam as recommended by their primary provider. All family members should have a colonoscopy by age 26.  FOLLOW-UP: Lastly, we discussed with Ms. Waterfield that cancer genetics is a rapidly advancing field and it is possible that new genetic tests will be appropriate for her and/or her family members in the future. We encouraged her to remain in contact with cancer genetics on an annual basis so we can update her personal and family histories and let her know of advances in cancer genetics that may benefit this family.   Our contact number was provided. Ms. Mcilrath questions were answered to her satisfaction, and she knows she is welcome to call us at anytime with additional questions or concerns.   Roma Kayser, MS, [facility] Certified Genetic Counselor [email or web address]

## 2016-02-23 NOTE — Telephone Encounter (Signed)
Revealed negative genetic testing on a custom panel that involved the breast/[place] cancer genes and melanoma genes.  She did have a BAP1 VUS.  We discussed that we will not change her medical management based on this VUS.

## 2016-03-21 ENCOUNTER — Other Ambulatory Visit: Payer: Self-pay | Admitting: Oncology

## 2016-03-21 DIAGNOSIS — C562 Malignant neoplasm of left ovary: Secondary | ICD-10-CM

## 2016-03-22 ENCOUNTER — Ambulatory Visit (HOSPITAL_BASED_OUTPATIENT_CLINIC_OR_DEPARTMENT_OTHER): Payer: Medicare HMO

## 2016-03-22 ENCOUNTER — Ambulatory Visit (HOSPITAL_BASED_OUTPATIENT_CLINIC_OR_DEPARTMENT_OTHER): Payer: Medicare HMO | Admitting: Oncology

## 2016-03-22 VITALS — BP 120/77 | HR 60 | Temp 97.9°F | Resp 18 | Ht 62.75 in | Wt 278.2 lb

## 2016-03-22 DIAGNOSIS — Z95828 Presence of other vascular implants and grafts: Secondary | ICD-10-CM

## 2016-03-22 DIAGNOSIS — Z85820 Personal history of malignant melanoma of skin: Secondary | ICD-10-CM

## 2016-03-22 DIAGNOSIS — Z452 Encounter for adjustment and management of vascular access device: Secondary | ICD-10-CM

## 2016-03-22 DIAGNOSIS — Z6841 Body Mass Index (BMI) 40.0 and over, adult: Secondary | ICD-10-CM

## 2016-03-22 DIAGNOSIS — C562 Malignant neoplasm of left ovary: Secondary | ICD-10-CM

## 2016-03-22 DIAGNOSIS — Z9889 Other specified postprocedural states: Secondary | ICD-10-CM

## 2016-03-22 LAB — COMPREHENSIVE METABOLIC PANEL
ALBUMIN: 3.3 g/dL — AB (ref 3.5–5.0)
ALK PHOS: 77 U/L (ref 40–150)
ALT: 27 U/L (ref 0–55)
AST: 23 U/L (ref 5–34)
Anion Gap: 7 mEq/L (ref 3–11)
BUN: 28.1 mg/dL — AB (ref 7.0–26.0)
CALCIUM: 9.3 mg/dL (ref 8.4–10.4)
CO2: 28 mEq/L (ref 22–29)
CREATININE: 0.9 mg/dL (ref 0.6–1.1)
Chloride: 102 mEq/L (ref 98–109)
EGFR: 74 mL/min/{1.73_m2} — ABNORMAL LOW (ref >90)
Glucose: 93 mg/dl (ref 70–140)
POTASSIUM: 3.7 meq/L (ref 3.5–5.1)
Sodium: 138 mEq/L (ref 136–145)
Total Bilirubin: 0.57 mg/dL (ref 0.20–1.20)
Total Protein: 8.5 g/dL — ABNORMAL HIGH (ref 6.4–8.3)

## 2016-03-22 LAB — CBC WITH DIFFERENTIAL/PLATELET
BASO%: 0.6 % (ref 0.0–2.0)
BASOS ABS: 0 10*3/uL (ref 0.0–0.1)
EOS%: 1 % (ref 0.0–7.0)
Eosinophils Absolute: 0 10*3/uL (ref 0.0–0.5)
HEMATOCRIT: 35.2 % (ref 34.8–46.6)
HEMOGLOBIN: 11.2 g/dL — AB (ref 11.6–15.9)
LYMPH#: 1.1 10*3/uL (ref 0.9–3.3)
LYMPH%: 21.7 % (ref 14.0–49.7)
MCH: 27.9 pg (ref 25.1–34.0)
MCHC: 31.9 g/dL (ref 31.5–36.0)
MCV: 87.4 fL (ref 79.5–101.0)
MONO#: 0.6 10*3/uL (ref 0.1–0.9)
MONO%: 11 % (ref 0.0–14.0)
NEUT#: 3.3 10*3/uL (ref 1.5–6.5)
NEUT%: 65.7 % (ref 38.4–76.8)
PLATELETS: 233 10*3/uL (ref 145–400)
RBC: 4.03 10*6/uL (ref 3.70–5.45)
RDW: 14.2 % (ref 11.2–14.5)
WBC: 5.1 10*3/uL (ref 3.9–10.3)

## 2016-03-22 MED ORDER — HEPARIN SOD (PORK) LOCK FLUSH 100 UNIT/ML IV SOLN
500.0000 [IU] | Freq: Once | INTRAVENOUS | Status: AC | PRN
  Administered 2016-03-22: 500 [IU] via INTRAVENOUS
  Filled 2016-03-22: qty 5

## 2016-03-22 MED ORDER — SODIUM CHLORIDE 0.9 % IJ SOLN
10.0000 mL | INTRAMUSCULAR | Status: DC | PRN
  Administered 2016-03-22: 10 mL via INTRAVENOUS
  Filled 2016-03-22: qty 10

## 2016-03-22 NOTE — Progress Notes (Signed)
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 She completed 3 partial cycles of adjuvant dose dense carbo taxol from 03-26-15 thru 05-29-15, tolerated extremely poorly with multiple delays and cytopenias, and discontinued at that time. Last imaging was CT AP 07-04-15. She saw Dr Denman George on 12-19-15 and will see her again 3 months from this visit.  She has PAC in. We have discussed removing this, however patient prefers to wait at least until she sees Dr Denman George next.   She had exam by Dr Wilhemina Bonito recently, follows every 6 mo for melanoma history.  Patient seems to have been at usual baseline since she was last here. She has had improvement in low back pain with interventions by chiropractor in last month. Appetite is good, bowels move regularly, no abdominal or pelvic pain, no nausea, no LE swelling, no bleeding, no SOB. No problems with PAC. No fefer or symptoms of infection. Remainder of 10 point Review of Systems negative.    PAC placed by IR 05-06-15, flushed 03-22-16. Refused flu vaccine fall 2016 Genetics testing 01-26-16: breast ovarian panel and melanoma gene panel by [facility] negative, tho has BAP1 VUS  CA 125 was 10 preop   ONCOLOGIC HISTORY Patient has been followed closely by Dr Corinna Capra of gyn, including hysteroscopy D & C 08-2013 for bleeding, with benign endometrial polyp then ([id number]). She had CT at outside facility 12-381 because of umbilical hernia, which showed 6 cm left adnexal mass with imaging consistent with ovarian cyst. She had follow up imaging in 12-2014 with cyst 6.9 cm, not complex, and CA 125 normal at 10 on 12-18-14. By repeat US 01-23-15 the area measured 9.7 x 7.0 x 6.4 cm with normal flow to ovary, no flow to cyst and no  free fluid, endometrial stripe 3.9 mm; patient was then having some pelvic pressure. She had abdominal hysterectomy BSO by Dr Corinna Capra on 02-11-15, with operative findings of adhesions from left adnexal mass to bowel and pelvic sidewall, no ascites, normal appearing omentum, normal appendix; there was unavoidable intraoperative rupture during surgery. Pathology [phone]) left clear cell ovarian carcinoma at least 7 cy with capsule rupture, benign left tube, right ovary and tube and uterus. Cytology of washings negative for malignancy ([id number]). Post operative course has been uncomplicated. CT CAP 03-07-15 showed no apparent metastatic disease, with hepatic steatosis, abdominal aortic atherosclerosis and some fluid at paraumbilical hernia. Patient saw Dr Denman George in consultation on 02-24-15, with recommendation for 6 cycles carboplatin taxol adjuvantly. She had day 1 cycle 1 dose dense carbo taxol on12-14-16. Day 8 cycle 1 held with ANC 1.3, granix 480 added day after each treatment. D8C2 delayed with neutropenia and other issues, day 8 cycle 2 given 05-15-15. Dose dense regimen was continued thru day 1 cycle 3 on 05-29-15, poorly tolerated including cytopenias, with multiple delays. CT AP 07-04-15, done with LLQ pain, showed no apparent residual or recurrent gyn malignancy. Genetics testing 01-2016 breast/ ovarian/ melanoma panels by [facility] negative with exception of BAP1 VUS.  History of melanoma right posterior thigh 2003, negative sentinel nodes, followed every 6 months by Dr Wilhemina Bonito.  Objective:  Vital signs in last 24 hours:  BP 120/77 (BP Location: Left Arm, Patient Position: Sitting)   Pulse 60   Temp 97.9 F (  36.6 C) (Oral)   Resp 18   Ht 5' 2.75" (1.594 m)   Wt 278 lb 3.2 oz (126.2 kg)   LMP  (LMP Unknown)   SpO2 98%   BMI 49.67 kg/m  Weight up 11 lbs. Alert, oriented and appropriate. Ambulatory with effort. Very talkative, pleasant. Able to get on and off exam table with assistance.    HEENT:PERRL, sclerae not icteric. Oral mucosa moist without lesions, posterior pharynx clear.  Neck supple. No JVD.  Lymphatics:no cervical,supaclavicular, axillary or inguinal adenopathy Resp: clear to auscultation bilaterally and normal percussion bilaterally Cardio: regular rate and rhythm. No gallop. GI: abdomen obese, soft, nontender, not obviously distended, no appreciable mass or organomegaly. Normally active bowel sounds. Surgical incision not remarkable. Musculoskeletal/ Extremities: Back not tender to palpation.  LE without pitting edema, cords, tenderness Neuro: no peripheral neuropathy. Otherwise nonfocal. PSYCH appropriate mood and affect Skin without rash, ecchymosis, petechiae. Scars upper back stable. No concerning lesions seen in areas examined. Portacath-without erythema or tenderness, flushed with good blood return today  Lab Results:  Results for orders placed or performed in visit on 03/22/16  CBC with Differential  Result Value Ref Range   WBC 5.1 3.9 - 10.3 10e3/uL   NEUT# 3.3 1.5 - 6.5 10e3/uL   HGB 11.2 (L) 11.6 - 15.9 g/dL   HCT 35.2 34.8 - 46.6 %   Platelets 233 145 - 400 10e3/uL   MCV 87.4 79.5 - 101.0 fL   MCH 27.9 25.1 - 34.0 pg   MCHC 31.9 31.5 - 36.0 g/dL   RBC 4.03 3.70 - 5.45 10e6/uL   RDW 14.2 11.2 - 14.5 %   lymph# 1.1 0.9 - 3.3 10e3/uL   MONO# 0.6 0.1 - 0.9 10e3/uL   Eosinophils Absolute 0.0 0.0 - 0.5 10e3/uL   Basophils Absolute 0.0 0.0 - 0.1 10e3/uL   NEUT% 65.7 38.4 - 76.8 %   LYMPH% 21.7 14.0 - 49.7 %   MONO% 11.0 0.0 - 14.0 %   EOS% 1.0 0.0 - 7.0 %   BASO% 0.6 0.0 - 2.0 %  Comprehensive metabolic panel  Result Value Ref Range   Sodium 138 136 - 145 mEq/L   Potassium 3.7 3.5 - 5.1 mEq/L   Chloride 102 98 - 109 mEq/L   CO2 28 22 - 29 mEq/L   Glucose 93 70 - 140 mg/dl   BUN 28.1 (H) 7.0 - 26.0 mg/dL   Creatinine 0.9 0.6 - 1.1 mg/dL   Total Bilirubin 0.57 0.20 - 1.20 mg/dL   Alkaline Phosphatase 77 40 - 150 U/L   AST 23 5 - 34  U/L   ALT 27 0 - 55 U/L   Total Protein 8.5 (H) 6.4 - 8.3 g/dL   Albumin 3.3 (L) 3.5 - 5.0 g/dL   Calcium 9.3 8.4 - 10.4 mg/dL   Anion Gap 7 3 - 11 mEq/L   EGFR 74 (L) >90 ml/min/1.73 m2     Studies/Results: 2D DIGITAL SCREENING BILATERAL MAMMOGRAM WITH CAD AND ADJUNCT TOMO 10-28-15  COMPARISON:  Previous exam(s).  ACR Breast Density Category b: There are scattered areas of fibroglandular density.  FINDINGS: There are no findings suspicious for malignancy. Images were processed with CAD.  IMPRESSION: No mammographic evidence of malignancy. A result letter of this screening mammogram will be mailed directly to the patient.  RECOMMENDATION: Screening mammogram in one year. (Code:SM-B-01Y)  BI-RADS CATEGORY  1: Negative.  Bone Density scan at [facility] 10[facility]28-15 by PCP NORMAL   Medications: I have  reviewed the patient's current medications. Patient agrees to flu vaccine today.  DISCUSSION Discussed possible removal of PAC. Peripheral IV access is difficult, tho ok for blood draws, and certainly always risks associated with intravascular foreign body, and transportation to this office can be difficult for them. She will think about removal and may want that if doing well at next visit with Dr Denman George. PAC was placed by IR.  Patient wonders about repeat colonoscopy, which she has had done by general surgeon previously. She will call that office to discuss .     Close friend now under Hospice care for metastatic cancer, which is understandably upsetting for Mrs Mccollister. I suggested she call that Hospice to inquire about grief counseling services.   Patient is aware that another medical oncologist will be working with gyn oncology after first of year. Next appointments with gyn onc +/- med onc to be set up from Dr Serita Grit next visit. Will need PAC maintained if not removed.   Assessment/Plan:  1. Clear cell carcinoma of ovary clinically IC: on observation since stopping  attempted adjuvant chemo 05-29-15.  Ovarian cancer found incidentally on CT for unrelated umbilical hernia, normal CA 125, incomplete staging with abdominal hysterectomy BSO by Dr Corinna Capra 02-11-15. Adjuvant carboplatin taxol begun 03-26-15 using dose dense regimen, delays for chemo neutropenia despite gCSF and for chemo thrombocytopenia; also required PRBCs for chemo anemia and other factors causing multiple missed treatments and delays. Chemotherapy discontinued at patient's request after day 1 cycle 3 on 05-29-15. CT AP 07-04-15 without findings of concern from standpoint of the ovarian cancer.  Genetics testing 01-2016 negative with VUS as noted.  2 Mild anemia stable. Iron studies good 06-2015. 3.Chemo neutropenia and chemo thrombocytopenia: resolved 4.Poor peripheral IV access, PAC in which she prefers to keep for now, needs flush every 6-8 weeks. She may agree to removal if doing well at next visit to Dr Denman George 5.Morbid obesity and severe deconditioning:encouraged weight loss and increased activity/ exercise..  6.history of crohn's disease/ ileitis, which has not required intervention in past 10 years. Known to [facility].  7.history of melanoma right posterior thigh 2003 with 2 sentinel nodes negative. Followed by dermatology 8.umbilical hernia since 9741, stable 9.multiple drug intolerances. EMR notes is able to take Rocephin  10.HTN x ~ 10 years followed now by PCP 11.GERD longstanding and unchanged 12.hepatic steatosis by CT 13. Thoracic and abdominal aortic atherosclerosis by CT 14. History of chronic anxiety and panic attacks 15.asthma: on regular treatment, exacerbations generally with respiratory illnesses. Has had wheezing after ASA, listed on allergies. 16.mammograms + DEXA done at [facility] 10-28-15, no mammographic findings of concern and normal bone density 17.flu vaccine 03-22-16   All questions answered and she knows to call if concerns prior to next scheduled visit. Route PCP,  cc Dr Wilhemina Bonito and Dr Madie Reno. Time spent 25 min including >50% counseling and coordination of care.   Evlyn Clines, MD   03/24/2016, 2:53 PM

## 2016-03-24 ENCOUNTER — Encounter: Payer: Self-pay | Admitting: Oncology

## 2016-03-26 ENCOUNTER — Telehealth: Payer: Self-pay | Admitting: *Deleted

## 2016-03-26 NOTE — Telephone Encounter (Signed)
LMOVM for pt regarding appt on 3/14 with Dr. Denman George at 1:30pm. Request call back to confirm message was received.

## 2016-05-04 ENCOUNTER — Ambulatory Visit (HOSPITAL_BASED_OUTPATIENT_CLINIC_OR_DEPARTMENT_OTHER): Payer: Medicare HMO

## 2016-05-04 VITALS — BP 137/83 | HR 74 | Temp 99.1°F | Resp 18

## 2016-05-04 DIAGNOSIS — Z452 Encounter for adjustment and management of vascular access device: Secondary | ICD-10-CM

## 2016-05-04 DIAGNOSIS — C562 Malignant neoplasm of left ovary: Secondary | ICD-10-CM | POA: Diagnosis not present

## 2016-05-04 DIAGNOSIS — Z95828 Presence of other vascular implants and grafts: Secondary | ICD-10-CM

## 2016-05-04 MED ORDER — HEPARIN SOD (PORK) LOCK FLUSH 100 UNIT/ML IV SOLN
500.0000 [IU] | Freq: Once | INTRAVENOUS | Status: AC | PRN
  Administered 2016-05-04: 500 [IU] via INTRAVENOUS
  Filled 2016-05-04: qty 5

## 2016-05-04 MED ORDER — SODIUM CHLORIDE 0.9 % IJ SOLN
10.0000 mL | INTRAMUSCULAR | Status: DC | PRN
  Administered 2016-05-04: 10 mL via INTRAVENOUS
  Filled 2016-05-04: qty 10

## 2016-05-24 ENCOUNTER — Encounter (HOSPITAL_COMMUNITY): Payer: Self-pay

## 2016-05-25 ENCOUNTER — Encounter (HOSPITAL_COMMUNITY): Payer: Self-pay

## 2016-06-22 ENCOUNTER — Telehealth: Payer: Self-pay | Admitting: *Deleted

## 2016-06-22 NOTE — Telephone Encounter (Signed)
Pt requesting to have labs drawn tomorrow with flush appt. Sees Dr Denman George as well. States she is feeling more tired.  Has not been scheduled with another oncologist since Dr Marko Plume retired.

## 2016-06-23 ENCOUNTER — Encounter: Payer: Self-pay | Admitting: Gynecologic Oncology

## 2016-06-23 ENCOUNTER — Telehealth: Payer: Self-pay | Admitting: *Deleted

## 2016-06-23 ENCOUNTER — Ambulatory Visit: Payer: Medicare HMO | Attending: Gynecologic Oncology | Admitting: Gynecologic Oncology

## 2016-06-23 ENCOUNTER — Ambulatory Visit (HOSPITAL_BASED_OUTPATIENT_CLINIC_OR_DEPARTMENT_OTHER): Payer: Medicare HMO

## 2016-06-23 VITALS — BP 136/75 | HR 74 | Temp 97.9°F | Resp 20 | Wt 275.4 lb

## 2016-06-23 DIAGNOSIS — Z8371 Family history of colonic polyps: Secondary | ICD-10-CM | POA: Insufficient documentation

## 2016-06-23 DIAGNOSIS — Z90710 Acquired absence of both cervix and uterus: Secondary | ICD-10-CM | POA: Insufficient documentation

## 2016-06-23 DIAGNOSIS — Z833 Family history of diabetes mellitus: Secondary | ICD-10-CM | POA: Insufficient documentation

## 2016-06-23 DIAGNOSIS — J45909 Unspecified asthma, uncomplicated: Secondary | ICD-10-CM | POA: Diagnosis not present

## 2016-06-23 DIAGNOSIS — R109 Unspecified abdominal pain: Secondary | ICD-10-CM

## 2016-06-23 DIAGNOSIS — Z8543 Personal history of malignant neoplasm of ovary: Secondary | ICD-10-CM | POA: Diagnosis not present

## 2016-06-23 DIAGNOSIS — Z91040 Latex allergy status: Secondary | ICD-10-CM | POA: Insufficient documentation

## 2016-06-23 DIAGNOSIS — Z886 Allergy status to analgesic agent status: Secondary | ICD-10-CM | POA: Insufficient documentation

## 2016-06-23 DIAGNOSIS — Z880 Allergy status to penicillin: Secondary | ICD-10-CM | POA: Insufficient documentation

## 2016-06-23 DIAGNOSIS — I10 Essential (primary) hypertension: Secondary | ICD-10-CM | POA: Insufficient documentation

## 2016-06-23 DIAGNOSIS — R5383 Other fatigue: Secondary | ICD-10-CM | POA: Diagnosis not present

## 2016-06-23 DIAGNOSIS — D649 Anemia, unspecified: Secondary | ICD-10-CM | POA: Insufficient documentation

## 2016-06-23 DIAGNOSIS — J4540 Moderate persistent asthma, uncomplicated: Secondary | ICD-10-CM

## 2016-06-23 DIAGNOSIS — Z9109 Other allergy status, other than to drugs and biological substances: Secondary | ICD-10-CM | POA: Diagnosis not present

## 2016-06-23 DIAGNOSIS — Z811 Family history of alcohol abuse and dependence: Secondary | ICD-10-CM | POA: Insufficient documentation

## 2016-06-23 DIAGNOSIS — K5090 Crohn's disease, unspecified, without complications: Secondary | ICD-10-CM | POA: Diagnosis not present

## 2016-06-23 DIAGNOSIS — Z85820 Personal history of malignant melanoma of skin: Secondary | ICD-10-CM | POA: Insufficient documentation

## 2016-06-23 DIAGNOSIS — Z90722 Acquired absence of ovaries, bilateral: Secondary | ICD-10-CM | POA: Insufficient documentation

## 2016-06-23 DIAGNOSIS — K219 Gastro-esophageal reflux disease without esophagitis: Secondary | ICD-10-CM | POA: Insufficient documentation

## 2016-06-23 DIAGNOSIS — J4541 Moderate persistent asthma with (acute) exacerbation: Secondary | ICD-10-CM

## 2016-06-23 DIAGNOSIS — F419 Anxiety disorder, unspecified: Secondary | ICD-10-CM | POA: Diagnosis not present

## 2016-06-23 DIAGNOSIS — C562 Malignant neoplasm of left ovary: Secondary | ICD-10-CM | POA: Insufficient documentation

## 2016-06-23 DIAGNOSIS — Z881 Allergy status to other antibiotic agents status: Secondary | ICD-10-CM | POA: Insufficient documentation

## 2016-06-23 DIAGNOSIS — Z885 Allergy status to narcotic agent status: Secondary | ICD-10-CM | POA: Insufficient documentation

## 2016-06-23 DIAGNOSIS — Z888 Allergy status to other drugs, medicaments and biological substances status: Secondary | ICD-10-CM | POA: Insufficient documentation

## 2016-06-23 DIAGNOSIS — Z803 Family history of malignant neoplasm of breast: Secondary | ICD-10-CM | POA: Insufficient documentation

## 2016-06-23 DIAGNOSIS — G4700 Insomnia, unspecified: Secondary | ICD-10-CM | POA: Insufficient documentation

## 2016-06-23 DIAGNOSIS — Z6841 Body Mass Index (BMI) 40.0 and over, adult: Secondary | ICD-10-CM | POA: Insufficient documentation

## 2016-06-23 DIAGNOSIS — Z95828 Presence of other vascular implants and grafts: Secondary | ICD-10-CM

## 2016-06-23 DIAGNOSIS — Z9889 Other specified postprocedural states: Secondary | ICD-10-CM | POA: Insufficient documentation

## 2016-06-23 DIAGNOSIS — Z808 Family history of malignant neoplasm of other organs or systems: Secondary | ICD-10-CM | POA: Insufficient documentation

## 2016-06-23 DIAGNOSIS — Z8249 Family history of ischemic heart disease and other diseases of the circulatory system: Secondary | ICD-10-CM | POA: Insufficient documentation

## 2016-06-23 LAB — CBC WITH DIFFERENTIAL/PLATELET
BASO%: 0.2 % (ref 0.0–2.0)
BASOS ABS: 0 10*3/uL (ref 0.0–0.1)
EOS ABS: 0.1 10*3/uL (ref 0.0–0.5)
EOS%: 1.2 % (ref 0.0–7.0)
HEMATOCRIT: 35.5 % (ref 34.8–46.6)
HGB: 11.5 g/dL — ABNORMAL LOW (ref 11.6–15.9)
LYMPH%: 24.4 % (ref 14.0–49.7)
MCH: 28.4 pg (ref 25.1–34.0)
MCHC: 32.4 g/dL (ref 31.5–36.0)
MCV: 87.7 fL (ref 79.5–101.0)
MONO#: 0.5 10*3/uL (ref 0.1–0.9)
MONO%: 10.3 % (ref 0.0–14.0)
NEUT%: 63.9 % (ref 38.4–76.8)
NEUTROS ABS: 3.2 10*3/uL (ref 1.5–6.5)
PLATELETS: 202 10*3/uL (ref 145–400)
RBC: 4.05 10*6/uL (ref 3.70–5.45)
RDW: 14.2 % (ref 11.2–14.5)
WBC: 5 10*3/uL (ref 3.9–10.3)
lymph#: 1.2 10*3/uL (ref 0.9–3.3)
nRBC: 0 % (ref 0–0)

## 2016-06-23 LAB — COMPREHENSIVE METABOLIC PANEL
ALT: 26 U/L (ref 0–55)
ANION GAP: 8 meq/L (ref 3–11)
AST: 26 U/L (ref 5–34)
Albumin: 3.6 g/dL (ref 3.5–5.0)
Alkaline Phosphatase: 85 U/L (ref 40–150)
BILIRUBIN TOTAL: 0.55 mg/dL (ref 0.20–1.20)
BUN: 30.6 mg/dL — ABNORMAL HIGH (ref 7.0–26.0)
CALCIUM: 9.4 mg/dL (ref 8.4–10.4)
CO2: 28 mEq/L (ref 22–29)
Chloride: 102 mEq/L (ref 98–109)
Creatinine: 1 mg/dL (ref 0.6–1.1)
EGFR: 65 mL/min/{1.73_m2} — ABNORMAL LOW (ref >90)
Glucose: 90 mg/dl (ref 70–140)
Potassium: 3.7 mEq/L (ref 3.5–5.1)
Sodium: 138 mEq/L (ref 136–145)
TOTAL PROTEIN: 8.6 g/dL — AB (ref 6.4–8.3)

## 2016-06-23 MED ORDER — BUDESONIDE-FORMOTEROL FUMARATE 160-4.5 MCG/ACT IN AERO: 2.0000 | INHALATION_SPRAY | Freq: Two times a day (BID) | RESPIRATORY_TRACT | 3 refills | Status: DC

## 2016-06-23 MED ORDER — ALBUTEROL SULFATE HFA 108 (90 BASE) MCG/ACT IN AERS: 2.0000 | INHALATION_SPRAY | RESPIRATORY_TRACT | 2 refills | Status: DC | PRN

## 2016-06-23 MED ORDER — SODIUM CHLORIDE 0.9% FLUSH
10.0000 mL | INTRAVENOUS | Status: DC | PRN
  Administered 2016-06-23: 10 mL via INTRAVENOUS
  Filled 2016-06-23: qty 10

## 2016-06-23 MED ORDER — HEPARIN SOD (PORK) LOCK FLUSH 100 UNIT/ML IV SOLN
500.0000 [IU] | Freq: Once | INTRAVENOUS | Status: AC
  Administered 2016-06-23: 500 [IU] via INTRAVENOUS
  Filled 2016-06-23: qty 5

## 2016-06-23 NOTE — Patient Instructions (Signed)
Dr. Denman George has ordered a CT Scan for you, once pre authorization has been obtained our office will contact you to schedule the CT Scan appointment.

## 2016-06-23 NOTE — Telephone Encounter (Signed)
Per clinic RN I have scheduled appt for lab, called and left a message for the patient to call the office

## 2016-06-23 NOTE — Progress Notes (Signed)
Follow-up Note: Gyn-Onc  Consult was initially requested by Dr. Corinna Capra for the evaluation of [name] 56 y.o. 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  CC:  Chief Complaint  Patient presents with  . Ovarian Cancer    Assessment/Plan:  [name]  is a 56 y.o.  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 Adjuvant chemotherapy from 03/26/15-05/29/15.  Complete clinical response of post-treatment imaging from 07/04/15.  She completed less than 3 full cycles of adjuvant chemotherapy with carboplatin and paclitaxel, due to extreme bone marrow toxicity. Normal Hb of 11+ now.  She has no evidence of recurrence on exam. CA 125 pending  1/ abdominal pains - evaluate with CT abdo/pelvis to rule out recurrence. If none, will have her see [facility] for evaluation of hernia for possible repair. 2/ Profound fatigue - unlikely to be secondary to her chemo at this point (too far out). Recommend she follow-up with Rosalyn Gess for sleep study.  She will see me in 3 months.  HPI: [name] is a 56 year old woman who was seen in consultation at the request of Dr Corinna Capra for clear cell ovarian cancer.  The patient has a history of abnormal uterine bleeding/symptomatic fibroids. An US of the pelvis in September 2016 showed a 7cm complex cystic mass seen in the left ovary. A repeat US in October 2016 showed tha the mass had increased to approximately 10cm and was accompanied with normal blood flow and a normal CA 125 (10U/mL on 12/18/14).  She then underwent an ex lap (via Pfannenstiel) TAH, BSO and partial omentectomy on 02/11/15 with Dr Corinna Capra at [facility] in [place]. At the time of surgery, the large cystic mass was appreciated to be densely adherent to the pelvic sidewalls and in the process of removing it, unavoidable rupture occurred. Of note, pelvic washings taken from before the cyst rupture were  negative for malignancy. According to the operative note there was complete resection of the cyst with no residual tumor adhesions or tissues in the pelvis and none identified in the peritoneal cavity. The omentum was grossly normal, and at the time of frozen section revealing malignancy, a sample from the infracolic omentum was taken and was noted to be negative for malignancy.  Final pathology from the surgery revealed clear cell carcinoma of the left ovary. The uterus and contralateral tube and ovary were benign. The omentum was negative for malignancy.  She has done well postoperatively with no major morbidities.  The patient has major medical conditions of morbid obesity (BMI 54 kg/m2), crohn's disease, HTN, osteoporosis.  She was first seen by me on 02/24/2015 and at that consultation we recommended 6 cycles of adjuvant carboplatin and paclitaxel chemotherapy after a pretreatment baseline CT scan.  CT scan of the chest abdomen and pelvis on 03/07/2015 showed node apparent metastatic disease however it did demonstrate some signs of postoperative changes and fluid collections. She went on to commence adjuvant chemotherapy with Dr. Marko Plume with day 1 of cycle 1 dose dense, but the paclitaxel on 03/26/2015. She had multiple dose delays and reductions through the course of her treatment due to neutropenia thrombocytopenia and anemia. She required Granix bone marrow stimulation. Her final chemotherapy dose was day 1 of cycle 3 given on every 16th 2017. As the patient was poorly tolerating treatment the patient and her oncologist myometrial decision to discontinue treatment at that point in time.  A  post therapeutic CT of the abdomen and pelvis on 07/04/2015 showed no apparent residual or recurrent GYN malignancy. Lab evaluations on 07/22/2015 showed normalization of her white blood cell count 4.4, hemoglobin was 9.8, and platelet count was 279.  CA 125 on 10/17/15 was normal at 8  Interval Hx:   Profound fatigue and daytime sleepiness - has never been evaluated for sleep apnea. Intermittent upper abdominal pains and left side to groin pains.  Current Meds:  Outpatient Encounter Prescriptions as of 06/23/2016  Medication Sig  . amLODipine (NORVASC) 2.5 MG tablet Take 2.5 mg by mouth.  . budesonide-formoterol (SYMBICORT) 160-4.5 MCG/ACT inhaler Inhale 2 puffs into the lungs 2 (two) times daily.  . [DISCONTINUED] budesonide-formoterol (SYMBICORT) 160-4.5 MCG/ACT inhaler INHALE 2 PUFFS BY MOUTH TWICE DAILY  . albuterol (PROVENTIL HFA;VENTOLIN HFA) 108 (90 Base) MCG/ACT inhaler Inhale 2 puffs into the lungs every 4 (four) hours as needed for wheezing or shortness of breath.  . alprazolam (XANAX) 2 MG tablet Take 2 mg by mouth 2 (two) times daily. 1/2 tab AM and 1 tab PM  . Ascorbic Acid (VITAMIN C) 1000 MG tablet Take 1,000 mg by mouth daily.  . Cholecalciferol (VITAMIN D3) 5000 UNITS TABS Take 1 tablet by mouth daily. Reported on 06/26/2015  . hydroxypropyl methylcellulose / hypromellose (ISOPTO TEARS / GONIOVISC) 2.5 % ophthalmic solution Place 2 drops into both eyes as needed for dry eyes. Reported on 08/25/2015  . ibuprofen (ADVIL,MOTRIN) 600 MG tablet Take 1 tablet (600 mg total) by mouth daily as needed (mild pain).  Marland Kitchen ketoconazole (NIZORAL) 2 % cream Apply 1 application topically daily as needed for irritation. Reported on 05/08/2015  . Lactobacillus (ACIDOPHILUS PO) Take 2 capsules by mouth daily.   Marland Kitchen levocetirizine (XYZAL) 5 MG tablet Take 5 mg by mouth every morning.   . lidocaine-prilocaine (EMLA) cream Apply to Porta-cath site 1-2 hours prior to access as directed.  . methocarbamol (ROBAXIN) 500 MG tablet TK 1 T PO Q 8 H PRF MUSCLE PAIN / RELAXATION  . montelukast (SINGULAIR) 10 MG tablet Take 10 mg by mouth at bedtime. Reported on 10/20/2015  . nystatin (MYCOSTATIN/NYSTOP) 100000 UNIT/GM POWD Apply powder to groin twice a day. (Patient not taking: Reported on 03/22/2016)  .  Omega-3 Fatty Acids (FISH OIL) 1000 MG CAPS Take 2 capsules by mouth daily. Reported on 06/26/2015  . promethazine (PHENERGAN) 25 MG tablet Take 25 mg by mouth every 6 (six) hours as needed for nausea. Reported on 10/20/2015  . ranitidine (ZANTAC) 150 MG tablet Take 150 mg by mouth 2 (two) times daily. Reported on 05/08/2015  . sertraline (ZOLOFT) 50 MG tablet Take 50 mg by mouth daily.  . SYMBICORT 160-4.5 MCG/ACT inhaler INHALE 2 PUFFS BY MOUTH TWICE DAILY  . triamcinolone cream (KENALOG) 0.1 % Apply 1 application topically 2 (two) times daily as needed. Reported on 08/25/2015  . triamterene-hydrochlorothiazide (MAXZIDE) 75-50 MG per tablet Take 1 tablet by mouth daily. Reported on 06/12/2015  . vitamin E 400 UNIT capsule Take 400 Units by mouth daily. Reported on 06/26/2015  . [DISCONTINUED] albuterol (PROVENTIL HFA;VENTOLIN HFA) 108 (90 Base) MCG/ACT inhaler Inhale 2 puffs into the lungs every 4 (four) hours as needed for wheezing or shortness of breath. (Patient not taking: Reported on 03/22/2016)  . [DISCONTINUED] alprazolam (XANAX) 2 MG tablet TAKE 1/2 TABLET BY MOUTH EVERY MORNING AND 1 TABLET BY MOUTH AT NIGHT  . [DISCONTINUED] amLODipine (NORVASC) 2.5 MG tablet Take 2.5 mg by mouth daily.  . [DISCONTINUED] methocarbamol (  ROBAXIN) 500 MG tablet Take 1 tablet by mouth every 8 hrs as needed for muscle pain and relaxation  . [DISCONTINUED] orphenadrine (NORFLEX) 100 MG tablet Take 100 mg by mouth 2 (two) times daily as needed for mild pain. Reported on 08/25/2015  . [DISCONTINUED] triamterene-hydrochlorothiazide (MAXZIDE) 75-50 MG tablet Take by mouth.  . [DISCONTINUED] sodium chloride flush (NS) 0.9 % injection 10 mL    No facility-administered encounter medications on file as of 06/23/2016.     Allergy:  Allergies  Allergen Reactions  . Levofloxacin Palpitations    REACTION: tachycardia  . Moxifloxacin Palpitations    REACTION: tachycardia but tolerated cirpofloxacin just fine  . Stadol  [Butorphanol] Other (See Comments)    Caused shakes for 6 weeks "shaking for weeks"  . Amoxicillin Rash    Can take 500 mg - amounts greater causes yeast infection REACTION: Rash with high doses. Can take lower doses like 586m  . Asa [Aspirin] Other (See Comments)    Pt stated the 81 mg caused Exacerbation of her asthma  - unsure about other asprins Asthma exacerbation  . Ciprofloxacin Hcl Nausea And Vomiting    Can take lower doses like 5043m Other causes yeast infection Can take lower doses like 50048m . Codeine Nausea Only and Nausea And Vomiting    REACTION: nausea  . Effexor [Venlafaxine] Other (See Comments)    hyperactivity Panic attacks  . Erythromycin Diarrhea and Nausea And Vomiting  . Latex Rash  . Losartan Potassium Nausea Only and Other (See Comments)    headache  . Paroxetine Other (See Comments)    Made nerves worse and caused fatigue  . Rocephin [Ceftriaxone] Diarrhea    diarrhea  . Butorphanol Tartrate     Gave her the shakes for 6 weeks  . Cephalosporins     REACTION: she has tolerated rocephin and Keflex without difficulty  . Clindamycin/Lincomycin Other (See Comments)    Tears stomach up.  . Losartan Other (See Comments)    Headache, nausea, fatigue  . Paxil [Paroxetine Hcl] Other (See Comments)    fatigue  . Clarithromycin Diarrhea  . Clindamycin Diarrhea  . Tape Rash    Social Hx:   Social History   Social History  . Marital status: Married    Spouse name: N/A  . Number of children: 0  . Years of education: N/A   Occupational History  . disabled    Social History Main Topics  . Smoking status: Never Smoker  . Smokeless tobacco: Never Used  . Alcohol use No  . Drug use: No  . Sexual activity: Not on file   Other Topics Concern  . Not on file   Social History Narrative  . No narrative on file    Past Surgical Hx:  Past Surgical History:  Procedure Laterality Date  . ABDOMINAL HYSTERECTOMY Bilateral 02/11/2015   Procedure:  HYSTERECTOMY ABDOMINAL, bilateral salpingo-oophorectomy;  Surgeon: DavLouretta ShortenD;  Location[facility];  Service: Gynecology;  Laterality: Bilateral;  . DILATATION & CURETTAGE/HYSTEROSCOPY WITH TRUECLEAR N/A 08/17/2013   Procedure: DILATATION & CURETTAGE/HYSTEROSCOPY WITH TRUCLEAR;  Surgeon: DavLuz LexD;  Location: [facility];  Service: Gynecology;  Laterality: N/A;  . DILATION AND CURETTAGE OF UTERUS    . MELANOMA EXCISION  06/2001   right leg; also removed 2 lymph nodes  . OMENTECTOMY N/A 02/11/2015   Procedure: OMENTECTOMY;  Surgeon: DavLouretta ShortenD;  [facility];  Service: Gynecology;  Laterality: N/A;    Past Medical Hx:  Past Medical History:  Diagnosis Date  . Anxiety   . Arthritis   . Asthma   . Crohn disease ([place])   . GERD (gastroesophageal reflux disease)   . Hypertension   . Insomnia   . Melanoma ([place])    rt. dorsal leg  . Pyoderma gangrenosum   . Skin cancer 2003   melanoma    Past Gynecological History:  See HPI  No LMP recorded (lmp unknown). Patient has had a hysterectomy.  Family Hx:  Family History  Problem Relation Age of Onset  . Colon polyps Father   . Diabetes Father   . Heart disease Paternal Uncle   . Asthma Mother   . Melanoma Maternal Aunt   . Diabetes Paternal Grandmother   . Alcohol abuse Paternal Grandfather   . Breast cancer Cousin     maternal first cousin  . Colon cancer Neg Hx     Review of Systems:  Constitutional  Feels well,    ENT Normal appearing ears and nares bilaterally Skin/Breast  No rash, sores, jaundice, itching, dryness Cardiovascular  No chest pain, shortness of breath, or edema  Pulmonary  No cough or wheeze.  Gastro Intestinal  No nausea, vomitting, or diarrhoea. No bright red blood per rectum, no abdominal pain, change in bowel movement, or constipation.  Genito Urinary  No frequency, urgency, dysuria, see HPI Musculo Skeletal  No myalgia, arthralgia, joint swelling or pain  Neurologic  No weakness,  numbness, change in gait,  Psychology  No depression, anxiety, insomnia.   Vitals:  Blood pressure 136/75, pulse 74, temperature 97.9 F (36.6 C), temperature source Oral, resp. rate 20, weight 275 lb 6.4 oz (124.9 kg), SpO2 97 %.  Physical Exam: WD in NAD Neck  Supple NROM, without any enlargements.  Lymph Node Survey No cervical supraclavicular or inguinal adenopathy Cardiovascular  Pulse normal rate, regularity and rhythm. S1 and S2 normal.  Lungs  Clear to auscultation bilateraly, without wheezes/crackles/rhonchi. Good air movement.  Skin  No rash/lesions/breakdown  Psychiatry  Alert and oriented to person, place, and time  Abdomen  Normoactive bowel sounds, abdomen soft, non-tender and obese. Well healed low transverse incision.  Soft, reducible umbilical hernia. Back No CVA tenderness Genito Urinary  Vulva/vagina: Normal external female genitalia.  No lesions. No discharge or bleeding.  Bladder/urethra:  No lesions or masses, well supported bladder  Vagina: grossly normal with in tact cuff.  Cervix: surgically absent  Uterus: surgically absent    Adnexa: no palpable masses. Rectal  Good tone, no masses no cul de sac nodularity.  Extremities  No bilateral cyanosis, clubbing or edema.  30 minutes of face to face counseling time was spent with the patient.  Donaciano Eva, MD  06/23/2016, 2:20 PM

## 2016-06-24 ENCOUNTER — Telehealth: Payer: Self-pay

## 2016-06-24 LAB — CA 125: Cancer Antigen (CA) 125: 9.3 U/mL (ref 0.0–38.1)

## 2016-06-24 NOTE — Telephone Encounter (Signed)
Message left for pt about CT scan appointment for 06/30/16 at 1 pm, arrival at 11 am due to pt request for water based contrast.

## 2016-06-30 ENCOUNTER — Ambulatory Visit (HOSPITAL_COMMUNITY): Payer: Medicare HMO

## 2016-07-05 ENCOUNTER — Ambulatory Visit (HOSPITAL_COMMUNITY)
Admission: RE | Admit: 2016-07-05 | Discharge: 2016-07-05 | Disposition: A | Discharge status: Home / Self Care | Payer: Medicare HMO | Source: Ambulatory Visit | Attending: Gynecologic Oncology | Admitting: Gynecologic Oncology

## 2016-07-05 ENCOUNTER — Encounter (HOSPITAL_COMMUNITY): Payer: Self-pay

## 2016-07-05 DIAGNOSIS — C562 Malignant neoplasm of left ovary: Secondary | ICD-10-CM

## 2016-07-05 DIAGNOSIS — K429 Umbilical hernia without obstruction or gangrene: Secondary | ICD-10-CM | POA: Diagnosis not present

## 2016-07-05 DIAGNOSIS — K449 Diaphragmatic hernia without obstruction or gangrene: Secondary | ICD-10-CM | POA: Diagnosis not present

## 2016-07-05 MED ORDER — IOPAMIDOL (ISOVUE-300) INJECTION 61%
INTRAVENOUS | Status: AC
  Filled 2016-07-05: qty 100

## 2016-07-05 MED ORDER — IOPAMIDOL (ISOVUE-300) INJECTION 61%
30.0000 mL | Freq: Once | INTRAVENOUS | Status: AC | PRN
  Administered 2016-07-05: 30 mL via ORAL

## 2016-07-05 MED ORDER — HEPARIN SOD (PORK) LOCK FLUSH 100 UNIT/ML IV SOLN
INTRAVENOUS | Status: AC
  Filled 2016-07-05: qty 5

## 2016-07-05 MED ORDER — IOPAMIDOL (ISOVUE-300) INJECTION 61%
INTRAVENOUS | Status: AC
  Administered 2016-07-05: 30 mL via ORAL
  Filled 2016-07-05: qty 30

## 2016-07-05 MED ORDER — HEPARIN SOD (PORK) LOCK FLUSH 100 UNIT/ML IV SOLN
500.0000 [IU] | Freq: Once | INTRAVENOUS | Status: AC
  Administered 2016-07-05: 500 [IU] via INTRAVENOUS

## 2016-07-05 MED ORDER — IOPAMIDOL (ISOVUE-300) INJECTION 61%
100.0000 mL | Freq: Once | INTRAVENOUS | Status: AC | PRN
  Administered 2016-07-05: 100 mL via INTRAVENOUS

## 2016-07-15 ENCOUNTER — Telehealth: Payer: Self-pay

## 2016-07-15 NOTE — Telephone Encounter (Signed)
Pt notified of CT results. Encouraged to call back with any questions.

## 2016-08-11 ENCOUNTER — Telehealth: Payer: Self-pay | Admitting: *Deleted

## 2016-08-11 NOTE — Telephone Encounter (Signed)
I have returned the patient's call regarding an appt. I have left a message to call the office back.

## 2016-08-11 NOTE — Telephone Encounter (Signed)
Patient called and moved appts from June 13th to June 20th. Patient aware.

## 2016-08-19 ENCOUNTER — Ambulatory Visit (HOSPITAL_BASED_OUTPATIENT_CLINIC_OR_DEPARTMENT_OTHER): Payer: Medicare HMO

## 2016-08-19 ENCOUNTER — Telehealth: Payer: Self-pay | Admitting: *Deleted

## 2016-08-19 DIAGNOSIS — Z95828 Presence of other vascular implants and grafts: Secondary | ICD-10-CM

## 2016-08-19 DIAGNOSIS — C562 Malignant neoplasm of left ovary: Secondary | ICD-10-CM | POA: Diagnosis not present

## 2016-08-19 DIAGNOSIS — Z452 Encounter for adjustment and management of vascular access device: Secondary | ICD-10-CM

## 2016-08-19 MED ORDER — HEPARIN SOD (PORK) LOCK FLUSH 100 UNIT/ML IV SOLN
500.0000 [IU] | INTRAVENOUS | Status: DC | PRN
  Administered 2016-08-19: 500 [IU]
  Filled 2016-08-19: qty 5

## 2016-08-19 MED ORDER — SODIUM CHLORIDE 0.9 % IJ SOLN
10.0000 mL | INTRAMUSCULAR | Status: AC | PRN
  Administered 2016-08-19: 10 mL
  Filled 2016-08-19: qty 10

## 2016-08-19 NOTE — Telephone Encounter (Signed)
Patient called and moved hr flush appt up to earlier time

## 2016-09-22 ENCOUNTER — Other Ambulatory Visit: Payer: Medicare HMO

## 2016-09-22 ENCOUNTER — Ambulatory Visit: Payer: Medicare HMO | Admitting: Gynecologic Oncology

## 2016-09-28 ENCOUNTER — Other Ambulatory Visit: Payer: Self-pay | Admitting: Gynecologic Oncology

## 2016-09-28 DIAGNOSIS — Z1231 Encounter for screening mammogram for malignant neoplasm of breast: Secondary | ICD-10-CM

## 2016-09-29 ENCOUNTER — Ambulatory Visit: Payer: Medicare HMO | Admitting: Gynecologic Oncology

## 2016-09-29 ENCOUNTER — Other Ambulatory Visit: Payer: Medicare HMO

## 2016-10-21 ENCOUNTER — Other Ambulatory Visit: Payer: Self-pay

## 2016-10-21 DIAGNOSIS — C562 Malignant neoplasm of left ovary: Secondary | ICD-10-CM

## 2016-10-27 ENCOUNTER — Encounter: Payer: Self-pay | Admitting: Gynecologic Oncology

## 2016-10-27 ENCOUNTER — Ambulatory Visit: Payer: Medicare HMO

## 2016-10-27 ENCOUNTER — Ambulatory Visit: Payer: Medicare HMO | Attending: Gynecologic Oncology | Admitting: Gynecologic Oncology

## 2016-10-27 VITALS — BP 138/80 | HR 55 | Temp 98.2°F | Resp 18 | Wt 278.0 lb

## 2016-10-27 DIAGNOSIS — Z881 Allergy status to other antibiotic agents status: Secondary | ICD-10-CM | POA: Insufficient documentation

## 2016-10-27 DIAGNOSIS — I10 Essential (primary) hypertension: Secondary | ICD-10-CM | POA: Insufficient documentation

## 2016-10-27 DIAGNOSIS — K219 Gastro-esophageal reflux disease without esophagitis: Secondary | ICD-10-CM | POA: Insufficient documentation

## 2016-10-27 DIAGNOSIS — M810 Age-related osteoporosis without current pathological fracture: Secondary | ICD-10-CM | POA: Diagnosis not present

## 2016-10-27 DIAGNOSIS — R109 Unspecified abdominal pain: Secondary | ICD-10-CM

## 2016-10-27 DIAGNOSIS — Z8543 Personal history of malignant neoplasm of ovary: Secondary | ICD-10-CM

## 2016-10-27 DIAGNOSIS — K5090 Crohn's disease, unspecified, without complications: Secondary | ICD-10-CM | POA: Diagnosis not present

## 2016-10-27 DIAGNOSIS — C562 Malignant neoplasm of left ovary: Secondary | ICD-10-CM | POA: Diagnosis not present

## 2016-10-27 DIAGNOSIS — C569 Malignant neoplasm of unspecified ovary: Secondary | ICD-10-CM | POA: Diagnosis present

## 2016-10-27 DIAGNOSIS — D259 Leiomyoma of uterus, unspecified: Secondary | ICD-10-CM | POA: Insufficient documentation

## 2016-10-27 DIAGNOSIS — N393 Stress incontinence (female) (male): Secondary | ICD-10-CM | POA: Insufficient documentation

## 2016-10-27 DIAGNOSIS — Z886 Allergy status to analgesic agent status: Secondary | ICD-10-CM | POA: Diagnosis not present

## 2016-10-27 DIAGNOSIS — Z6841 Body Mass Index (BMI) 40.0 and over, adult: Secondary | ICD-10-CM | POA: Insufficient documentation

## 2016-10-27 DIAGNOSIS — F419 Anxiety disorder, unspecified: Secondary | ICD-10-CM | POA: Insufficient documentation

## 2016-10-27 DIAGNOSIS — Z95828 Presence of other vascular implants and grafts: Secondary | ICD-10-CM

## 2016-10-27 DIAGNOSIS — Z880 Allergy status to penicillin: Secondary | ICD-10-CM | POA: Diagnosis not present

## 2016-10-27 DIAGNOSIS — Z85820 Personal history of malignant melanoma of skin: Secondary | ICD-10-CM | POA: Diagnosis not present

## 2016-10-27 DIAGNOSIS — J45909 Unspecified asthma, uncomplicated: Secondary | ICD-10-CM | POA: Insufficient documentation

## 2016-10-27 DIAGNOSIS — C7982 Secondary malignant neoplasm of genital organs: Secondary | ICD-10-CM

## 2016-10-27 DIAGNOSIS — Z79899 Other long term (current) drug therapy: Secondary | ICD-10-CM | POA: Diagnosis not present

## 2016-10-27 HISTORY — DX: Stress incontinence (female) (male): N39.3

## 2016-10-27 MED ORDER — HEPARIN SOD (PORK) LOCK FLUSH 100 UNIT/ML IV SOLN
500.0000 [IU] | Freq: Once | INTRAVENOUS | Status: AC
  Administered 2016-10-27: 500 [IU] via INTRAVENOUS
  Filled 2016-10-27: qty 5

## 2016-10-27 MED ORDER — SODIUM CHLORIDE 0.9% FLUSH
10.0000 mL | INTRAVENOUS | Status: DC | PRN
Start: 2016-10-27 — End: 2016-10-27
  Administered 2016-10-27: 10 mL via INTRAVENOUS
  Filled 2016-10-27: qty 10

## 2016-10-27 NOTE — Progress Notes (Signed)
Follow-up Note: Gyn-Onc  Consult was initially requested by Dr. Corinna Capra for the evaluation of [name] 57 y.o. 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  CC:  Chief Complaint  Patient presents with  . Ovarian Cancer    Assessment/Plan:  [name]  is a 56 y.o.  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 Adjuvant chemotherapy from 03/26/15-05/29/15.  Complete clinical response of post-treatment imaging from 07/04/15.  She completed less than 3 full cycles of adjuvant chemotherapy with carboplatin and paclitaxel, due to extreme bone marrow toxicity.   She has no evidence of recurrence on exam. CA 125 pending  1/ abdominal pains - chronic, stable, no evidence of recurrence on imaging, labs or exam. 2/ stress urinary incontinence - will refer to Dr McDiarmid at [facility]  She will see me in 3 months.  HPI: [name] is a 56 year old woman who was seen in consultation at the request of Dr Corinna Capra for clear cell ovarian cancer.  The patient has a history of abnormal uterine bleeding/symptomatic fibroids. An US of the pelvis in September 2016 showed a 7cm complex cystic mass seen in the left ovary. A repeat US in October 2016 showed tha the mass had increased to approximately 10cm and was accompanied with normal blood flow and a normal CA 125 (10U/mL on 12/18/14).  She then underwent an ex lap (via Pfannenstiel) TAH, BSO and partial omentectomy on 02/11/15 with Dr Corinna Capra at [facility] in [place]. At the time of surgery, the large cystic mass was appreciated to be densely adherent to the pelvic sidewalls and in the process of removing it, unavoidable rupture occurred. Of note, pelvic washings taken from before the cyst rupture were negative for malignancy. According to the operative note there was complete resection of the cyst with no residual tumor adhesions or tissues in the  pelvis and none identified in the peritoneal cavity. The omentum was grossly normal, and at the time of frozen section revealing malignancy, a sample from the infracolic omentum was taken and was noted to be negative for malignancy.  Final pathology from the surgery revealed clear cell carcinoma of the left ovary. The uterus and contralateral tube and ovary were benign. The omentum was negative for malignancy.  She has done well postoperatively with no major morbidities.  The patient has major medical conditions of morbid obesity (BMI 54 kg/m2), crohn's disease, HTN, osteoporosis.  She was first seen by me on 02/24/2015 and at that consultation we recommended 6 cycles of adjuvant carboplatin and paclitaxel chemotherapy after a pretreatment baseline CT scan.  CT scan of the chest abdomen and pelvis on 03/07/2015 showed node apparent metastatic disease however it did demonstrate some signs of postoperative changes and fluid collections. She went on to commence adjuvant chemotherapy with Dr. Marko Plume with day 1 of cycle 1 dose dense, but the paclitaxel on 03/26/2015. She had multiple dose delays and reductions through the course of her treatment due to neutropenia thrombocytopenia and anemia. She required Granix bone marrow stimulation. Her final chemotherapy dose was day 1 of cycle 3 given on every 16th 2017. As the patient was poorly tolerating treatment the patient and her oncologist myometrial decision to discontinue treatment at that point in time.  A post therapeutic CT of the abdomen and pelvis on 07/04/2015 showed no apparent residual or recurrent GYN malignancy. Lab evaluations on 07/22/2015 showed normalization of her white blood  cell count 4.4, hemoglobin was 9.8, and platelet count was 279.  CA 125 on 10/17/15 was normal at 8 and normal at 9.3 on 06/23/16. CT abdo/pelvis on 07/05/16 ordered for persistent left sided abdominal pain showed a small umbilical hernia but no evidence of recurrence and no  apparent explanation for her left sided abdominal pain.  Interval Hx:  She has stress urinary incontinence symptoms and has persistent abdominal pains on left.  Current Meds:  Outpatient Encounter Prescriptions as of 10/27/2016  Medication Sig  . albuterol (PROVENTIL HFA;VENTOLIN HFA) 108 (90 Base) MCG/ACT inhaler Inhale 2 puffs into the lungs every 4 (four) hours as needed for wheezing or shortness of breath.  . alprazolam (XANAX) 2 MG tablet Take 2 mg by mouth 2 (two) times daily. 1/2 tab AM and 1 tab PM  . amLODipine (NORVASC) 2.5 MG tablet Take 2.5 mg by mouth.  . Ascorbic Acid (VITAMIN C) 1000 MG tablet Take 1,000 mg by mouth daily.  . budesonide-formoterol (SYMBICORT) 160-4.5 MCG/ACT inhaler Inhale 2 puffs into the lungs 2 (two) times daily.  . Cholecalciferol (VITAMIN D3) 5000 UNITS TABS Take 1 tablet by mouth daily. Reported on 06/26/2015  . hydroxypropyl methylcellulose / hypromellose (ISOPTO TEARS / GONIOVISC) 2.5 % ophthalmic solution Place 2 drops into both eyes as needed for dry eyes. Reported on 08/25/2015  . ibuprofen (ADVIL,MOTRIN) 600 MG tablet Take 1 tablet (600 mg total) by mouth daily as needed (mild pain).  Marland Kitchen ketoconazole (NIZORAL) 2 % cream Apply 1 application topically daily as needed for irritation. Reported on 05/08/2015  . Lactobacillus (ACIDOPHILUS PO) Take 2 capsules by mouth daily.   Marland Kitchen levocetirizine (XYZAL) 5 MG tablet Take 5 mg by mouth every morning.   . lidocaine-prilocaine (EMLA) cream Apply to Porta-cath site 1-2 hours prior to access as directed.  . methocarbamol (ROBAXIN) 500 MG tablet TK 1 T PO Q 8 H PRF MUSCLE PAIN / RELAXATION  . montelukast (SINGULAIR) 10 MG tablet Take 10 mg by mouth at bedtime. Reported on 10/20/2015  . nystatin (MYCOSTATIN/NYSTOP) 100000 UNIT/GM POWD Apply powder to groin twice a day.  . Omega-3 Fatty Acids (FISH OIL) 1000 MG CAPS Take 2 capsules by mouth daily. Reported on 06/26/2015  . promethazine (PHENERGAN) 25 MG tablet Take 25 mg by  mouth every 6 (six) hours as needed for nausea. Reported on 10/20/2015  . ranitidine (ZANTAC) 150 MG tablet Take 150 mg by mouth 2 (two) times daily. Reported on 05/08/2015  . sertraline (ZOLOFT) 50 MG tablet Take 50 mg by mouth daily.  . SYMBICORT 160-4.5 MCG/ACT inhaler INHALE 2 PUFFS BY MOUTH TWICE DAILY  . triamcinolone cream (KENALOG) 0.1 % Apply 1 application topically 2 (two) times daily as needed. Reported on 08/25/2015  . triamterene-hydrochlorothiazide (MAXZIDE) 75-50 MG per tablet Take 1 tablet by mouth daily. Reported on 06/12/2015  . vitamin E 400 UNIT capsule Take 400 Units by mouth daily. Reported on 06/26/2015  . [DISCONTINUED] sodium chloride flush (NS) 0.9 % injection 10 mL    No facility-administered encounter medications on file as of 10/27/2016.     Allergy:  Allergies  Allergen Reactions  . Levofloxacin Palpitations    REACTION: tachycardia  . Moxifloxacin Palpitations    REACTION: tachycardia but tolerated cirpofloxacin just fine  . Stadol [Butorphanol] Other (See Comments)    Caused shakes for 6 weeks "shaking for weeks"  . Amoxicillin Rash    Can take 500 mg - amounts greater causes yeast infection REACTION: Rash with high doses. Can  take lower doses like 559m  . Asa [Aspirin] Other (See Comments)    Pt stated the 81 mg caused Exacerbation of her asthma  - unsure about other asprins Asthma exacerbation  . Ciprofloxacin Hcl Nausea And Vomiting    Can take lower doses like 5019m Other causes yeast infection Can take lower doses like 50075m . Codeine Nausea Only and Nausea And Vomiting    REACTION: nausea  . Effexor [Venlafaxine] Other (See Comments)    hyperactivity Panic attacks  . Erythromycin Diarrhea and Nausea And Vomiting  . Latex Rash  . Losartan Potassium Nausea Only and Other (See Comments)    headache  . Paroxetine Other (See Comments)    Made nerves worse and caused fatigue  . Rocephin [Ceftriaxone] Diarrhea    diarrhea  . Butorphanol Tartrate      Gave her the shakes for 6 weeks  . Cephalosporins     REACTION: she has tolerated rocephin and Keflex without difficulty  . Clindamycin/Lincomycin Other (See Comments)    Tears stomach up.  . Influenza Vaccines     Pt states that she got really sick from flu vaccine was sick a total of 6 weeks  . Losartan Other (See Comments)    Headache, nausea, fatigue  . Paxil [Paroxetine Hcl] Other (See Comments)    fatigue  . Clarithromycin Diarrhea  . Clindamycin Diarrhea  . Tape Rash    Social Hx:   Social History   Social History  . Marital status: Married    Spouse name: N/A  . Number of children: 0  . Years of education: N/A   Occupational History  . disabled    Social History Main Topics  . Smoking status: Never Smoker  . Smokeless tobacco: Never Used  . Alcohol use No  . Drug use: No  . Sexual activity: Not on file   Other Topics Concern  . Not on file   Social History Narrative  . No narrative on file    Past Surgical Hx:  Past Surgical History:  Procedure Laterality Date  . ABDOMINAL HYSTERECTOMY Bilateral 02/11/2015   Procedure: HYSTERECTOMY ABDOMINAL, bilateral salpingo-oophorectomy;  Surgeon: DavLouretta ShortenD;  Location: [facility];  Service: Gynecology;  Laterality: Bilateral;  . DILATATION & CURETTAGE/HYSTEROSCOPY WITH TRUECLEAR N/A 08/17/2013   Procedure: DILATATION & CURETTAGE/HYSTEROSCOPY WITH TRUCLEAR;  Surgeon: DavLuz LexD;  Location: [facility];  Service: Gynecology;  Laterality: N/A;  . DILATION AND CURETTAGE OF UTERUS    . MELANOMA EXCISION  06/2001   right leg; also removed 2 lymph nodes  . OMENTECTOMY N/A 02/11/2015   Procedure: OMENTECTOMY;  Surgeon: DavLouretta ShortenD;  Location: [facility];  Service: Gynecology;  Laterality: N/A;    Past Medical Hx:  Past Medical History:  Diagnosis Date  . Anxiety   . Arthritis   . Asthma   . Crohn disease ([place] . GERD (gastroesophageal reflux disease)   . Hypertension   . Insomnia   . [place].  dorsal leg  . Pyoderma gangrenosum   . Skin cancer 2003   melanoma    Past Gynecological History:  See HPI  No LMP recorded (lmp unknown). Patient has had a hysterectomy.  Family Hx:  Family History  Problem Relation Age of Onset  . Colon polyps Father   . Diabetes Father   . Heart disease Paternal Uncle   . Asthma Mother   . Melanoma Maternal Aunt   . Diabetes Paternal Grandmother   .  Alcohol abuse Paternal Grandfather   . Breast cancer Cousin        maternal first cousin  . Colon cancer Neg Hx     Review of Systems:  Constitutional  Feels well,    ENT Normal appearing ears and nares bilaterally Skin/Breast  No rash, sores, jaundice, itching, dryness Cardiovascular  No chest pain, shortness of breath, or edema  Pulmonary  No cough or wheeze.  Gastro Intestinal  No nausea, vomitting, or diarrhoea. No bright red blood per rectum, no abdominal pain, change in bowel movement, or constipation.  Genito Urinary  No frequency, urgency, dysuria, see HPI Musculo Skeletal  No myalgia, arthralgia, joint swelling or pain  Neurologic  No weakness, numbness, change in gait,  Psychology  No depression, anxiety, insomnia.   Vitals:  Blood pressure 138/80, pulse (!) 55, temperature 98.2 F (36.8 C), temperature source Oral, resp. rate 18, weight 278 lb (126.1 kg), SpO2 99 %.  Physical Exam: WD in NAD Neck  Supple NROM, without any enlargements.  Lymph Node Survey No cervical supraclavicular or inguinal adenopathy Cardiovascular  Pulse normal rate, regularity and rhythm. S1 and S2 normal.  Lungs  Clear to auscultation bilateraly, without wheezes/crackles/rhonchi. Good air movement.  Skin  No rash/lesions/breakdown  Psychiatry  Alert and oriented to person, place, and time  Abdomen  Normoactive bowel sounds, abdomen soft, non-tender and obese. Well healed low transverse incision.  Soft, reducible umbilical hernia. Back No CVA tenderness Genito Urinary  Vulva/vagina:  Normal external female genitalia.  No lesions. No discharge or bleeding.  Bladder/urethra:  No lesions or masses, well supported bladder  Vagina: grossly normal with in tact cuff.  Cervix: surgically absent  Uterus: surgically absent    Adnexa: no palpable masses. Rectal  Good tone, no masses no cul de sac nodularity.  Extremities  No bilateral cyanosis, clubbing or edema.  20 minutes of face to face counseling time was spent with the patient.  Donaciano Eva, MD  10/27/2016, 2:32 PM

## 2016-10-27 NOTE — Patient Instructions (Addendum)
We will make a referral for you to meet with a urologist at [facility].  Plan to follow up in three months or sooner if needed with a CA 125 before.  Please call for any questions or concerns.

## 2016-10-27 NOTE — Patient Instructions (Signed)

## 2016-10-28 ENCOUNTER — Telehealth: Payer: Self-pay

## 2016-10-28 ENCOUNTER — Encounter: Payer: Self-pay | Admitting: Gynecologic Oncology

## 2016-10-28 LAB — CA 125: CANCER ANTIGEN (CA) 125: 8.1 U/mL (ref 0.0–38.1)

## 2016-10-28 NOTE — Telephone Encounter (Signed)
-----   Message from Dorothyann Gibbs, NP sent at 10/28/2016  7:52 AM EDT ----- Please let her know that her CA 125 is normal and stable.   Thank you Melissa ----- Message ----- From: Interface, Lab In Three Zero One Sent: 10/28/2016   7:40 AM To: Dorothyann Gibbs, NP

## 2016-10-28 NOTE — Progress Notes (Signed)
Referral form along with patient's records faxed to [facility] for new patient referral.  Fax confirmation obtained.

## 2016-10-28 NOTE — Telephone Encounter (Signed)
LM for [name] stating that her CA-125 was normal at 8.1 yesterday 10-27-17. She can call back to [phone] she has any questions or concerns.

## 2016-11-05 ENCOUNTER — Telehealth: Payer: Self-pay

## 2016-11-05 NOTE — Telephone Encounter (Signed)
LM in pt's  vm stating that her flush appointment on 12-15-16 was r/s to 12 noon as 11 am not available. [facility] has not been able to get in touch with her to schedule an appointment for her incontinence to be evaluated.  Requested that she call [facility] at [phone].

## 2016-11-08 ENCOUNTER — Ambulatory Visit: Payer: Medicare HMO

## 2016-11-09 ENCOUNTER — Other Ambulatory Visit: Payer: Self-pay | Admitting: Gynecologic Oncology

## 2016-11-09 DIAGNOSIS — N644 Mastodynia: Secondary | ICD-10-CM

## 2016-11-15 ENCOUNTER — Ambulatory Visit
Admission: RE | Admit: 2016-11-15 | Discharge: 2016-11-15 | Disposition: A | Discharge status: Home / Self Care | Payer: Medicare HMO | Source: Ambulatory Visit | Attending: Gynecologic Oncology | Admitting: Gynecologic Oncology

## 2016-11-15 ENCOUNTER — Ambulatory Visit: Payer: Medicare HMO

## 2016-11-15 DIAGNOSIS — N644 Mastodynia: Secondary | ICD-10-CM

## 2016-11-16 ENCOUNTER — Telehealth: Payer: Self-pay | Admitting: *Deleted

## 2016-11-16 NOTE — Telephone Encounter (Signed)
Attempted to contact the patient regarding the referral to [facility]. Left a voicemail message for the patient to call [facility] at [phone], or to call our office if she had any concerns [phone].

## 2016-11-17 ENCOUNTER — Ambulatory Visit: Payer: Medicare HMO

## 2016-11-17 ENCOUNTER — Other Ambulatory Visit: Payer: Medicare HMO

## 2016-12-02 ENCOUNTER — Telehealth: Payer: Self-pay | Admitting: *Deleted

## 2016-12-02 NOTE — Telephone Encounter (Signed)
Patient called and moved her flush appt from September 5th to August 29th.

## 2016-12-08 ENCOUNTER — Telehealth: Payer: Self-pay | Admitting: *Deleted

## 2016-12-08 NOTE — Telephone Encounter (Signed)
Patient called and moved her flush appt from today to Friday.

## 2016-12-10 ENCOUNTER — Ambulatory Visit (HOSPITAL_BASED_OUTPATIENT_CLINIC_OR_DEPARTMENT_OTHER): Payer: Medicare HMO

## 2016-12-10 DIAGNOSIS — Z452 Encounter for adjustment and management of vascular access device: Secondary | ICD-10-CM | POA: Diagnosis not present

## 2016-12-10 DIAGNOSIS — C562 Malignant neoplasm of left ovary: Secondary | ICD-10-CM

## 2016-12-10 MED ORDER — HEPARIN SOD (PORK) LOCK FLUSH 100 UNIT/ML IV SOLN
500.0000 [IU] | INTRAVENOUS | Status: DC | PRN
  Administered 2016-12-10: 500 [IU]
  Filled 2016-12-10: qty 5

## 2016-12-10 MED ORDER — SODIUM CHLORIDE 0.9 % IJ SOLN
10.0000 mL | INTRAMUSCULAR | Status: AC | PRN
  Administered 2016-12-10: 10 mL
  Filled 2016-12-10: qty 10

## 2016-12-17 ENCOUNTER — Telehealth: Payer: Self-pay | Admitting: *Deleted

## 2016-12-17 NOTE — Telephone Encounter (Signed)
Patient called back and moved her appt from October 17th to October 11th.

## 2017-01-18 ENCOUNTER — Telehealth: Payer: Self-pay | Admitting: *Deleted

## 2017-01-18 NOTE — Telephone Encounter (Signed)
Patient called and left a message that she "needed to move her appts from Thursday to today due to the weather that is coming." Called the patient back and explained "We have no available today for appts, to keep your apprt for Thursday and call if the weather is bad." Patient was agreeable.

## 2017-01-20 ENCOUNTER — Other Ambulatory Visit: Payer: Medicare HMO

## 2017-01-20 ENCOUNTER — Telehealth: Payer: Self-pay | Admitting: *Deleted

## 2017-01-20 ENCOUNTER — Ambulatory Visit: Payer: Medicare HMO | Admitting: Gynecologic Oncology

## 2017-01-20 NOTE — Telephone Encounter (Signed)
Attempted to contact the patient regarding her canceled appts. LMOM to the office back to reschedule.

## 2017-01-21 ENCOUNTER — Telehealth: Payer: Self-pay | Admitting: *Deleted

## 2017-01-21 NOTE — Telephone Encounter (Signed)
Return patient's call and rescheduled appts from yesterday to November 7th.

## 2017-01-26 ENCOUNTER — Other Ambulatory Visit: Payer: Medicare HMO

## 2017-01-26 ENCOUNTER — Ambulatory Visit: Payer: Medicare HMO | Admitting: Gynecologic Oncology

## 2017-02-14 ENCOUNTER — Telehealth: Payer: Self-pay | Admitting: *Deleted

## 2017-02-14 NOTE — Telephone Encounter (Signed)
Returned patient's call and left message. Advised the patient "Per Melissa APP, we feel you need to call your PCP/primary doctor and have them follow you for this issue of being weak/fatigue and sleeping all the time. They need to give you a well look over."

## 2017-02-16 ENCOUNTER — Ambulatory Visit (HOSPITAL_BASED_OUTPATIENT_CLINIC_OR_DEPARTMENT_OTHER): Payer: Medicare HMO

## 2017-02-16 ENCOUNTER — Ambulatory Visit: Payer: Medicare HMO | Attending: Gynecologic Oncology | Admitting: Gynecologic Oncology

## 2017-02-16 ENCOUNTER — Encounter: Payer: Self-pay | Admitting: Gynecologic Oncology

## 2017-02-16 ENCOUNTER — Other Ambulatory Visit: Payer: Medicare HMO

## 2017-02-16 VITALS — BP 150/84 | HR 85 | Temp 98.8°F | Resp 20 | Wt 281.3 lb

## 2017-02-16 DIAGNOSIS — Z85820 Personal history of malignant melanoma of skin: Secondary | ICD-10-CM | POA: Diagnosis not present

## 2017-02-16 DIAGNOSIS — Z880 Allergy status to penicillin: Secondary | ICD-10-CM | POA: Diagnosis not present

## 2017-02-16 DIAGNOSIS — Z9221 Personal history of antineoplastic chemotherapy: Secondary | ICD-10-CM

## 2017-02-16 DIAGNOSIS — Z8543 Personal history of malignant neoplasm of ovary: Secondary | ICD-10-CM

## 2017-02-16 DIAGNOSIS — K219 Gastro-esophageal reflux disease without esophagitis: Secondary | ICD-10-CM | POA: Insufficient documentation

## 2017-02-16 DIAGNOSIS — K429 Umbilical hernia without obstruction or gangrene: Secondary | ICD-10-CM | POA: Insufficient documentation

## 2017-02-16 DIAGNOSIS — Z6841 Body Mass Index (BMI) 40.0 and over, adult: Secondary | ICD-10-CM

## 2017-02-16 DIAGNOSIS — N393 Stress incontinence (female) (male): Secondary | ICD-10-CM

## 2017-02-16 DIAGNOSIS — G4700 Insomnia, unspecified: Secondary | ICD-10-CM | POA: Insufficient documentation

## 2017-02-16 DIAGNOSIS — C562 Malignant neoplasm of left ovary: Secondary | ICD-10-CM | POA: Diagnosis not present

## 2017-02-16 DIAGNOSIS — M810 Age-related osteoporosis without current pathological fracture: Secondary | ICD-10-CM | POA: Insufficient documentation

## 2017-02-16 DIAGNOSIS — T451X5A Adverse effect of antineoplastic and immunosuppressive drugs, initial encounter: Secondary | ICD-10-CM

## 2017-02-16 DIAGNOSIS — Z90710 Acquired absence of both cervix and uterus: Secondary | ICD-10-CM | POA: Diagnosis not present

## 2017-02-16 DIAGNOSIS — K5090 Crohn's disease, unspecified, without complications: Secondary | ICD-10-CM | POA: Diagnosis not present

## 2017-02-16 DIAGNOSIS — Z886 Allergy status to analgesic agent status: Secondary | ICD-10-CM | POA: Insufficient documentation

## 2017-02-16 DIAGNOSIS — Z452 Encounter for adjustment and management of vascular access device: Secondary | ICD-10-CM | POA: Diagnosis not present

## 2017-02-16 DIAGNOSIS — Z95828 Presence of other vascular implants and grafts: Secondary | ICD-10-CM

## 2017-02-16 DIAGNOSIS — F419 Anxiety disorder, unspecified: Secondary | ICD-10-CM | POA: Diagnosis not present

## 2017-02-16 DIAGNOSIS — Z79899 Other long term (current) drug therapy: Secondary | ICD-10-CM | POA: Diagnosis not present

## 2017-02-16 DIAGNOSIS — Z881 Allergy status to other antibiotic agents status: Secondary | ICD-10-CM | POA: Diagnosis not present

## 2017-02-16 DIAGNOSIS — J45909 Unspecified asthma, uncomplicated: Secondary | ICD-10-CM | POA: Insufficient documentation

## 2017-02-16 DIAGNOSIS — I10 Essential (primary) hypertension: Secondary | ICD-10-CM | POA: Diagnosis not present

## 2017-02-16 DIAGNOSIS — R109 Unspecified abdominal pain: Secondary | ICD-10-CM | POA: Diagnosis not present

## 2017-02-16 DIAGNOSIS — D701 Agranulocytosis secondary to cancer chemotherapy: Secondary | ICD-10-CM

## 2017-02-16 MED ORDER — SODIUM CHLORIDE 0.9 % IJ SOLN
10.0000 mL | INTRAMUSCULAR | Status: DC | PRN
  Administered 2017-02-16: 10 mL via INTRAVENOUS
  Filled 2017-02-16: qty 10

## 2017-02-16 MED ORDER — HEPARIN SOD (PORK) LOCK FLUSH 100 UNIT/ML IV SOLN
500.0000 [IU] | Freq: Once | INTRAVENOUS | Status: AC | PRN
  Administered 2017-02-16: 500 [IU] via INTRAVENOUS
  Filled 2017-02-16: qty 5

## 2017-02-16 NOTE — Patient Instructions (Signed)
Please notify Dr Denman George at phone number [phone] if you notice vaginal bleeding, new pelvic or abdominal pains, bloating, feeling full easy, or a change in bladder or bowel function.   Please return to see Dr Denman George in February, 2019.

## 2017-02-16 NOTE — Patient Instructions (Signed)
Implanted Port Home Guide An implanted port is a type of central line that is placed under the skin. Central lines are used to provide IV access when treatment or nutrition needs to be given through a person's veins. Implanted ports are used for long-term IV access. An implanted port may be placed because:  You need IV medicine that would be irritating to the small veins in your hands or arms.  You need long-term IV medicines, such as antibiotics.  You need IV nutrition for a long period.  You need frequent blood draws for lab tests.  You need dialysis.  Implanted ports are usually placed in the chest area, but they can also be placed in the upper arm, the abdomen, or the leg. An implanted port has two main parts:  Reservoir. The reservoir is round and will appear as a small, raised area under your skin. The reservoir is the part where a needle is inserted to give medicines or draw blood.  Catheter. The catheter is a thin, flexible tube that extends from the reservoir. The catheter is placed into a large vein. Medicine that is inserted into the reservoir goes into the catheter and then into the vein.  How will I care for my incision site? Do not get the incision site wet. Bathe or shower as directed by your health care provider. How is my port accessed? Special steps must be taken to access the port:  Before the port is accessed, a numbing cream can be placed on the skin. This helps numb the skin over the port site.  Your health care provider uses a sterile technique to access the port. ? Your health care provider must put on a mask and sterile gloves. ? The skin over your port is cleaned carefully with an antiseptic and allowed to dry. ? The port is gently pinched between sterile gloves, and a needle is inserted into the port.  Only "non-coring" port needles should be used to access the port. Once the port is accessed, a blood return should be checked. This helps ensure that the port  is in the vein and is not clogged.  If your port needs to remain accessed for a constant infusion, a clear (transparent) bandage will be placed over the needle site. The bandage and needle will need to be changed every week, or as directed by your health care provider.  Keep the bandage covering the needle clean and dry. Do not get it wet. Follow your health care provider's instructions on how to take a shower or bath while the port is accessed.  If your port does not need to stay accessed, no bandage is needed over the port.  What is flushing? Flushing helps keep the port from getting clogged. Follow your health care provider's instructions on how and when to flush the port. Ports are usually flushed with saline solution or a medicine called heparin. The need for flushing will depend on how the port is used.  If the port is used for intermittent medicines or blood draws, the port will need to be flushed: ? After medicines have been given. ? After blood has been drawn. ? As part of routine maintenance.  If a constant infusion is running, the port may not need to be flushed.  How long will my port stay implanted? The port can stay in for as long as your health care provider thinks it is needed. When it is time for the port to come out, surgery will be   done to remove it. The procedure is similar to the one performed when the port was put in. When should I seek immediate medical care? When you have an implanted port, you should seek immediate medical care if:  You notice a bad smell coming from the incision site.  You have swelling, redness, or drainage at the incision site.  You have more swelling or pain at the port site or the surrounding area.  You have a fever that is not controlled with medicine.  This information is not intended to replace advice given to you by your health care provider. Make sure you discuss any questions you have with your health care provider. Document  Released: 03/29/2005 Document Revised: 09/04/2015 Document Reviewed: 12/04/2012 Elsevier Interactive Patient Education  2017 [employer]  

## 2017-02-16 NOTE — Progress Notes (Signed)
Follow-up Note: Gyn-Onc  Consult was initially requested by Dr. Corinna Capra for the evaluation of [name] 56 y.o. 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  CC:  Chief Complaint  Patient presents with  . Ovarian CA, left [facility])    Assessment/Plan:  [name]  is a 56 y.o.  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 Adjuvant chemotherapy from 03/26/15-05/29/15.  Complete clinical response of post-treatment imaging from 07/04/15.  She completed less than 3 full cycles of adjuvant chemotherapy with carboplatin and paclitaxel, due to extreme bone marrow toxicity.   She has no evidence of recurrence on exam. CA 125 pending  1/ abdominal pains - chronic, stable, no evidence of recurrence on imaging, labs or exam. 2/ stress urinary incontinence - Dr McDiarmid at [facility]  She will see me in 3 months after which we will transition to 6 monthly evaluations if she remains disease free.  HPI: [name] is a 56 year old woman who was seen in consultation at the request of Dr Corinna Capra for clear cell ovarian cancer.  The patient has a history of abnormal uterine bleeding/symptomatic fibroids. An US of the pelvis in September 2016 showed a 7cm complex cystic mass seen in the left ovary. A repeat US in October 2016 showed tha the mass had increased to approximately 10cm and was accompanied with normal blood flow and a normal CA 125 (10U/mL on 12/18/14).  She then underwent an ex lap (via Pfannenstiel) TAH, BSO and partial omentectomy on 02/11/15 with Dr Corinna Capra at [facility] in [place]. At the time of surgery, the large cystic mass was appreciated to be densely adherent to the pelvic sidewalls and in the process of removing it, unavoidable rupture occurred. Of note, pelvic washings taken from before the cyst rupture were negative for malignancy. According to the operative note there was complete  resection of the cyst with no residual tumor adhesions or tissues in the pelvis and none identified in the peritoneal cavity. The omentum was grossly normal, and at the time of frozen section revealing malignancy, a sample from the infracolic omentum was taken and was noted to be negative for malignancy.  Final pathology from the surgery revealed clear cell carcinoma of the left ovary. The uterus and contralateral tube and ovary were benign. The omentum was negative for malignancy.  She has done well postoperatively with no major morbidities.  The patient has major medical conditions of morbid obesity (BMI 54 kg/m2), crohn's disease, HTN, osteoporosis.  She was first seen by me on 02/24/2015 and at that consultation we recommended 6 cycles of adjuvant carboplatin and paclitaxel chemotherapy after a pretreatment baseline CT scan.  CT scan of the chest abdomen and pelvis on 03/07/2015 showed node apparent metastatic disease however it did demonstrate some signs of postoperative changes and fluid collections. She went on to commence adjuvant chemotherapy with Dr. Marko Plume with day 1 of cycle 1 dose dense, but the paclitaxel on 03/26/2015. She had multiple dose delays and reductions through the course of her treatment due to neutropenia thrombocytopenia and anemia. She required Granix bone marrow stimulation. Her final chemotherapy dose was day 1 of cycle 3 given on every 16th 2017. As the patient was poorly tolerating treatment the patient and her oncologist myometrial decision to discontinue treatment at that point in time.  A post therapeutic CT of the abdomen and pelvis on 07/04/2015 showed no apparent residual or  recurrent GYN malignancy. Lab evaluations on 07/22/2015 showed normalization of her white blood cell count 4.4, hemoglobin was 9.8, and platelet count was 279.  CA 125 on 10/17/15 was normal at 8 and normal at 9.3 on 06/23/16. CT abdo/pelvis on 07/05/16 ordered for persistent left sided abdominal  pain showed a "tiny" umbilical hernia but no evidence of recurrence and no apparent explanation for her left sided abdominal pain.  Interval Hx:  She has stress urinary incontinence symptoms and has persistent abdominal pains on left.   Current Meds:  Outpatient Encounter Medications as of 02/16/2017  Medication Sig  . albuterol (PROVENTIL HFA;VENTOLIN HFA) 108 (90 Base) MCG/ACT inhaler Inhale 2 puffs into the lungs every 4 (four) hours as needed for wheezing or shortness of breath.  . alprazolam (XANAX) 2 MG tablet Take 2 mg by mouth 2 (two) times daily. 1/2 tab AM and 1 tab PM  . amLODipine (NORVASC) 2.5 MG tablet Take 2.5 mg by mouth.  . Ascorbic Acid (VITAMIN C) 1000 MG tablet Take 1,000 mg by mouth daily.  . budesonide-formoterol (SYMBICORT) 160-4.5 MCG/ACT inhaler Inhale 2 puffs into the lungs 2 (two) times daily.  . Cholecalciferol (VITAMIN D3) 5000 UNITS TABS Take 1 tablet by mouth daily. Reported on 06/26/2015  . hydroxypropyl methylcellulose / hypromellose (ISOPTO TEARS / GONIOVISC) 2.5 % ophthalmic solution Place 2 drops into both eyes as needed for dry eyes. Reported on 08/25/2015  . ibuprofen (ADVIL,MOTRIN) 600 MG tablet Take 1 tablet (600 mg total) by mouth daily as needed (mild pain).  Marland Kitchen ketoconazole (NIZORAL) 2 % cream Apply 1 application topically daily as needed for irritation. Reported on 05/08/2015  . Lactobacillus (ACIDOPHILUS PO) Take 2 capsules by mouth daily.   Marland Kitchen levocetirizine (XYZAL) 5 MG tablet Take 5 mg by mouth every morning.   . lidocaine-prilocaine (EMLA) cream Apply to Porta-cath site 1-2 hours prior to access as directed.  . methocarbamol (ROBAXIN) 500 MG tablet TK 1 T PO Q 8 H PRF MUSCLE PAIN / RELAXATION  . montelukast (SINGULAIR) 10 MG tablet Take 10 mg by mouth at bedtime. Reported on 10/20/2015  . nystatin (MYCOSTATIN/NYSTOP) 100000 UNIT/GM POWD Apply powder to groin twice a day.  . Omega-3 Fatty Acids (FISH OIL) 1000 MG CAPS Take 2 capsules by mouth daily.  Reported on 06/26/2015  . promethazine (PHENERGAN) 25 MG tablet Take 25 mg by mouth every 6 (six) hours as needed for nausea. Reported on 10/20/2015  . ranitidine (ZANTAC) 150 MG tablet Take 150 mg by mouth 2 (two) times daily. Reported on 05/08/2015  . sertraline (ZOLOFT) 50 MG tablet Take 50 mg by mouth daily.  . SYMBICORT 160-4.5 MCG/ACT inhaler INHALE 2 PUFFS BY MOUTH TWICE DAILY  . triamcinolone cream (KENALOG) 0.1 % Apply 1 application topically 2 (two) times daily as needed. Reported on 08/25/2015  . triamterene-hydrochlorothiazide (MAXZIDE) 75-50 MG per tablet Take 1 tablet by mouth daily. Reported on 06/12/2015  . vitamin E 400 UNIT capsule Take 400 Units by mouth daily. Reported on 06/26/2015  . [DISCONTINUED] sodium chloride 0.9 % injection 10 mL    No facility-administered encounter medications on file as of 02/16/2017.     Allergy:  Allergies  Allergen Reactions  . Levofloxacin Palpitations    REACTION: tachycardia  . Moxifloxacin Palpitations    REACTION: tachycardia but tolerated cirpofloxacin just fine  . Stadol [Butorphanol] Other (See Comments)    Caused shakes for 6 weeks "shaking for weeks"  . Amoxicillin Rash    Can take 500 mg -  amounts greater causes yeast infection REACTION: Rash with high doses. Can take lower doses like 581m  . Asa [Aspirin] Other (See Comments)    Pt stated the 81 mg caused Exacerbation of her asthma  - unsure about other asprins Asthma exacerbation  . Ciprofloxacin Hcl Nausea And Vomiting    Can take lower doses like 5070m Other causes yeast infection Can take lower doses like 50045m . Codeine Nausea Only and Nausea And Vomiting    REACTION: nausea  . Effexor [Venlafaxine] Other (See Comments)    hyperactivity Panic attacks  . Erythromycin Diarrhea and Nausea And Vomiting  . Latex Rash  . Losartan Potassium Nausea Only and Other (See Comments)    headache  . Paroxetine Other (See Comments)    Made nerves worse and caused fatigue  .  Rocephin [Ceftriaxone] Diarrhea    diarrhea  . Butorphanol Tartrate     Gave her the shakes for 6 weeks  . Cephalosporins     REACTION: she has tolerated rocephin and Keflex without difficulty  . Clindamycin/Lincomycin Other (See Comments)    Tears stomach up.  . Influenza Vaccines     Pt states that she got really sick from flu vaccine was sick a total of 6 weeks  . Losartan Other (See Comments)    Headache, nausea, fatigue  . Paxil [Paroxetine Hcl] Other (See Comments)    fatigue  . Clarithromycin Diarrhea  . Clindamycin Diarrhea  . Tape Rash    Social Hx:   Social History   Socioeconomic History  . Marital status: Married    Spouse name: Not on file  . Number of children: 0  . Years of education: Not on file  . Highest education level: Not on file  Social Needs  . Financial resource strain: Not on file  . Food insecurity - worry: Not on file  . Food insecurity - inability: Not on file  . Transportation needs - medical: Not on file  . Transportation needs - non-medical: Not on file  Occupational History  . Occupation: disabled  Tobacco Use  . Smoking status: Never Smoker  . Smokeless tobacco: Never Used  Substance and Sexual Activity  . Alcohol use: No  . Drug use: No  . Sexual activity: Not on file  Other Topics Concern  . Not on file  Social History Narrative  . Not on file    Past Surgical Hx:  Past Surgical History:  Procedure Laterality Date  . DILATION AND CURETTAGE OF UTERUS    . MELANOMA EXCISION  06/2001   right leg; also removed 2 lymph nodes    Past Medical Hx:  Past Medical History:  Diagnosis Date  . Anxiety   . Arthritis   . Asthma   . Cro[place] disease [place] GERD (gastroesophageal reflux disease)   . Hypertension   . Insomnia   . [place]anoma ([place]  rt. dorsal leg  . Pyoderma gangrenosum   . Skin cancer 2003   melanoma    Past Gynecological History:  See HPI  No LMP recorded (lmp unknown). Patient has had a  hysterectomy.  Family Hx:  Family History  Problem Relation Age of Onset  . Colon polyps Father   . Diabetes Father   . Heart disease Paternal Uncle   . Asthma Mother   . Melanoma Maternal Aunt   . Diabetes Paternal Grandmother   . Alcohol abuse Paternal Grandfather   . Breast cancer Cousin  maternal first cousin  . Colon cancer Neg Hx     Review of Systems:  Constitutional  Feels well,    ENT Normal appearing ears and nares bilaterally Skin/Breast  No rash, sores, jaundice, itching, dryness Cardiovascular  No chest pain, shortness of breath, or edema  Pulmonary  No cough or wheeze.  Gastro Intestinal  No nausea, vomitting, or diarrhoea. No bright red blood per rectum, no abdominal pain, change in bowel movement, or constipation.  Genito Urinary  No frequency, urgency, dysuria, see HPI Musculo Skeletal  No myalgia, arthralgia, joint swelling or pain  Neurologic  No weakness, numbness, change in gait,  Psychology  No depression, anxiety, insomnia.   Vitals:  Blood pressure (!) 150/84, pulse 85, temperature 98.8 F (37.1 C), resp. rate 20, weight 281 lb 4.8 oz (127.6 kg), SpO2 96 %.  Physical Exam: WD in NAD Neck  Supple NROM, without any enlargements.  Lymph Node Survey No cervical supraclavicular or inguinal adenopathy Cardiovascular  Pulse normal rate, regularity and rhythm. S1 and S2 normal.  Lungs  Clear to auscultation bilateraly, without wheezes/crackles/rhonchi. Good air movement.  Skin  No rash/lesions/breakdown  Psychiatry  Alert and oriented to person, place, and time  Abdomen  Normoactive bowel sounds, abdomen soft, non-tender and obese. Well healed low transverse incision.  Soft, reducible umbilical hernia. Back No CVA tenderness Genito Urinary  Vulva/vagina: Normal external female genitalia.  No lesions. No discharge or bleeding.  Bladder/urethra:  No lesions or masses, well supported bladder  Vagina: grossly normal with in tact  cuff.  Cervix: surgically absent  Uterus: surgically absent    Adnexa: no palpable masses. Rectal  Good tone, no masses no cul de sac nodularity.  Extremities  No bilateral cyanosis, clubbing or edema.  20 minutes of face to face counseling time was spent with the patient.  Donaciano Eva, MD  02/16/2017, 3:15 PM

## 2017-02-17 LAB — CA 125: Cancer Antigen (CA) 125: 9.2 U/mL (ref 0.0–38.1)

## 2017-02-18 ENCOUNTER — Telehealth: Payer: Self-pay

## 2017-02-18 NOTE — Telephone Encounter (Signed)
LM in VM with results of CA-125 level at 9.2 which is stable and in good normal range per Joylene John, NP.

## 2017-03-30 ENCOUNTER — Ambulatory Visit (HOSPITAL_BASED_OUTPATIENT_CLINIC_OR_DEPARTMENT_OTHER): Payer: Medicare HMO

## 2017-03-30 DIAGNOSIS — Z8543 Personal history of malignant neoplasm of ovary: Secondary | ICD-10-CM

## 2017-03-30 DIAGNOSIS — Z452 Encounter for adjustment and management of vascular access device: Secondary | ICD-10-CM

## 2017-03-30 DIAGNOSIS — Z95828 Presence of other vascular implants and grafts: Secondary | ICD-10-CM

## 2017-03-30 MED ORDER — HEPARIN SOD (PORK) LOCK FLUSH 100 UNIT/ML IV SOLN
500.0000 [IU] | Freq: Once | INTRAVENOUS | Status: AC | PRN
  Administered 2017-03-30: 500 [IU] via INTRAVENOUS
  Filled 2017-03-30: qty 5

## 2017-03-30 MED ORDER — SODIUM CHLORIDE 0.9 % IJ SOLN
10.0000 mL | INTRAMUSCULAR | Status: DC | PRN
  Administered 2017-03-30: 10 mL via INTRAVENOUS
  Filled 2017-03-30: qty 10

## 2017-05-18 ENCOUNTER — Inpatient Hospital Stay: Payer: Medicare HMO | Attending: Gynecologic Oncology

## 2017-05-18 ENCOUNTER — Encounter: Payer: Self-pay | Admitting: Gynecologic Oncology

## 2017-05-18 ENCOUNTER — Inpatient Hospital Stay: Payer: Medicare HMO

## 2017-05-18 ENCOUNTER — Inpatient Hospital Stay (HOSPITAL_BASED_OUTPATIENT_CLINIC_OR_DEPARTMENT_OTHER): Payer: Medicare HMO | Admitting: Gynecologic Oncology

## 2017-05-18 VITALS — BP 127/68 | HR 60 | Temp 97.6°F | Resp 18 | Ht 64.0 in | Wt 275.6 lb

## 2017-05-18 DIAGNOSIS — R059 Cough, unspecified: Secondary | ICD-10-CM

## 2017-05-18 DIAGNOSIS — C562 Malignant neoplasm of left ovary: Secondary | ICD-10-CM

## 2017-05-18 DIAGNOSIS — I10 Essential (primary) hypertension: Secondary | ICD-10-CM

## 2017-05-18 DIAGNOSIS — R05 Cough: Secondary | ICD-10-CM

## 2017-05-18 DIAGNOSIS — K219 Gastro-esophageal reflux disease without esophagitis: Secondary | ICD-10-CM | POA: Diagnosis not present

## 2017-05-18 DIAGNOSIS — Z90722 Acquired absence of ovaries, bilateral: Secondary | ICD-10-CM

## 2017-05-18 DIAGNOSIS — Z90710 Acquired absence of both cervix and uterus: Secondary | ICD-10-CM | POA: Diagnosis not present

## 2017-05-18 DIAGNOSIS — Z9221 Personal history of antineoplastic chemotherapy: Secondary | ICD-10-CM

## 2017-05-18 DIAGNOSIS — K5090 Crohn's disease, unspecified, without complications: Secondary | ICD-10-CM

## 2017-05-18 DIAGNOSIS — Z79899 Other long term (current) drug therapy: Secondary | ICD-10-CM | POA: Diagnosis not present

## 2017-05-18 DIAGNOSIS — Z6841 Body Mass Index (BMI) 40.0 and over, adult: Secondary | ICD-10-CM

## 2017-05-18 DIAGNOSIS — Z95828 Presence of other vascular implants and grafts: Secondary | ICD-10-CM

## 2017-05-18 MED ORDER — SODIUM CHLORIDE 0.9 % IJ SOLN
10.0000 mL | INTRAMUSCULAR | Status: DC | PRN
  Administered 2017-05-18: 10 mL via INTRAVENOUS
  Filled 2017-05-18: qty 10

## 2017-05-18 MED ORDER — HEPARIN SOD (PORK) LOCK FLUSH 100 UNIT/ML IV SOLN
500.0000 [IU] | Freq: Once | INTRAVENOUS | Status: AC | PRN
  Administered 2017-05-18: 500 [IU] via INTRAVENOUS
  Filled 2017-05-18: qty 5

## 2017-05-18 NOTE — Progress Notes (Signed)
Follow-up Note: Gyn-Onc  Consult was initially requested by Dr. Corinna Capra for the evaluation of LAILONI BAQUERA 57 y.o. 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  CC:  Chief Complaint  Patient presents with  . Ovarian CA, left [facility]    Assessment/Plan:  Ms. LASHAUN POCH  is a 57 y.o.  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 Adjuvant chemotherapy from 03/26/15-05/29/15.  Complete clinical response of post-treatment imaging from 07/04/15.  She completed less than 3 full cycles of adjuvant chemotherapy with carboplatin and paclitaxel, due to extreme bone marrow toxicity.   She has no evidence of recurrence on exam. CA 125 pending, if normal, she will have her port removed.  Provided with names and contact information for [facility] for cough/CHF work-up.   She will see me in 6 months after which we will transition to 6 monthly evaluations if she remains disease free.  HPI: Siarah Deleo is a 57 year old woman who was seen in consultation at the request of Dr Corinna Capra for clear cell ovarian cancer.  The patient has a history of abnormal uterine bleeding/symptomatic fibroids. An US of the pelvis in September 2016 showed a 7cm complex cystic mass seen in the left ovary. A repeat US in October 2016 showed tha the mass had increased to approximately 10cm and was accompanied with normal blood flow and a normal CA 125 (10U/mL on 12/18/14).  She then underwent an ex lap (via Pfannenstiel) TAH, BSO and partial omentectomy on 02/11/15 with Dr Corinna Capra at [facility] in [place]. At the time of surgery, the large cystic mass was appreciated to be densely adherent to the pelvic sidewalls and in the process of removing it, unavoidable rupture occurred. Of note, pelvic washings taken from before the cyst rupture were negative for malignancy. According to the operative note there was complete resection of the cyst  with no residual tumor adhesions or tissues in the pelvis and none identified in the peritoneal cavity. The omentum was grossly normal, and at the time of frozen section revealing malignancy, a sample from the infracolic omentum was taken and was noted to be negative for malignancy.  Final pathology from the surgery revealed clear cell carcinoma of the left ovary. The uterus and contralateral tube and ovary were benign. The omentum was negative for malignancy.  She has done well postoperatively with no major morbidities.  The patient has major medical conditions of morbid obesity (BMI 54 kg/m2), crohn's disease, HTN, osteoporosis.  She was first seen by me on 02/24/2015 and at that consultation we recommended 6 cycles of adjuvant carboplatin and paclitaxel chemotherapy after a pretreatment baseline CT scan.  CT scan of the chest abdomen and pelvis on 03/07/2015 showed node apparent metastatic disease however it did demonstrate some signs of postoperative changes and fluid collections. She went on to commence adjuvant chemotherapy with Dr. Marko Plume with day 1 of cycle 1 dose dense, but the paclitaxel on 03/26/2015. She had multiple dose delays and reductions through the course of her treatment due to neutropenia thrombocytopenia and anemia. She required Granix bone marrow stimulation. Her final chemotherapy dose was day 1 of cycle 3 given on every 16th 2017. As the patient was poorly tolerating treatment the patient and her oncologist myometrial decision to discontinue treatment at that point in time.  A post therapeutic CT of the abdomen and pelvis on 07/04/2015 showed no apparent residual or recurrent GYN malignancy. Lab  evaluations on 07/22/2015 showed normalization of her white blood cell count 4.4, hemoglobin was 9.8, and platelet count was 279.  CA 125 on 10/17/15 was normal at 8 and normal at 9.3 on 06/23/16. CT abdo/pelvis on 07/05/16 ordered for persistent left sided abdominal pain showed a "tiny"  umbilical hernia but no evidence of recurrence and no apparent explanation for her left sided abdominal pain. CA 125 on 9.2 on 02/16/17.  Interval Hx:  She has had bronchitis, pneumonia and is now being worked up for possible CHF.  Current Meds:  Outpatient Encounter Medications as of 05/18/2017  Medication Sig  . albuterol (PROVENTIL HFA;VENTOLIN HFA) 108 (90 Base) MCG/ACT inhaler Inhale 2 puffs into the lungs every 4 (four) hours as needed for wheezing or shortness of breath.  . alprazolam (XANAX) 2 MG tablet Take 2 mg by mouth 2 (two) times daily. 1/2 tab AM and 1 tab PM  . amLODipine (NORVASC) 2.5 MG tablet Take 2.5 mg by mouth.  . Ascorbic Acid (VITAMIN C) 1000 MG tablet Take 1,000 mg by mouth daily.  . budesonide-formoterol (SYMBICORT) 160-4.5 MCG/ACT inhaler Inhale 2 puffs into the lungs 2 (two) times daily.  . Cholecalciferol (VITAMIN D3) 5000 UNITS TABS Take 1 tablet by mouth daily. Reported on 06/26/2015  . hydroxypropyl methylcellulose / hypromellose (ISOPTO TEARS / GONIOVISC) 2.5 % ophthalmic solution Place 2 drops into both eyes as needed for dry eyes. Reported on 08/25/2015  . ibuprofen (ADVIL,MOTRIN) 600 MG tablet Take 1 tablet (600 mg total) by mouth daily as needed (mild pain).  Marland Kitchen ketoconazole (NIZORAL) 2 % cream Apply 1 application topically daily as needed for irritation. Reported on 05/08/2015  . Lactobacillus (ACIDOPHILUS PO) Take 2 capsules by mouth daily.   Marland Kitchen levocetirizine (XYZAL) 5 MG tablet Take 5 mg by mouth every morning.   . lidocaine-prilocaine (EMLA) cream Apply to Porta-cath site 1-2 hours prior to access as directed.  . methocarbamol (ROBAXIN) 500 MG tablet TK 1 T PO Q 8 H PRF MUSCLE PAIN / RELAXATION  . montelukast (SINGULAIR) 10 MG tablet Take 10 mg by mouth at bedtime. Reported on 10/20/2015  . nystatin (MYCOSTATIN/NYSTOP) 100000 UNIT/GM POWD Apply powder to groin twice a day.  . Omega-3 Fatty Acids (FISH OIL) 1000 MG CAPS Take 2 capsules by mouth daily. Reported  on 06/26/2015  . promethazine (PHENERGAN) 25 MG tablet Take 25 mg by mouth every 6 (six) hours as needed for nausea. Reported on 10/20/2015  . ranitidine (ZANTAC) 150 MG tablet Take 150 mg by mouth 2 (two) times daily. Reported on 05/08/2015  . sertraline (ZOLOFT) 50 MG tablet Take 50 mg by mouth daily.  . SYMBICORT 160-4.5 MCG/ACT inhaler INHALE 2 PUFFS BY MOUTH TWICE DAILY  . triamcinolone cream (KENALOG) 0.1 % Apply 1 application topically 2 (two) times daily as needed. Reported on 08/25/2015  . triamterene-hydrochlorothiazide (MAXZIDE) 75-50 MG per tablet Take 1 tablet by mouth daily. Reported on 06/12/2015  . vitamin E 400 UNIT capsule Take 400 Units by mouth daily. Reported on 06/26/2015  . [DISCONTINUED] sodium chloride 0.9 % injection 10 mL    No facility-administered encounter medications on file as of 05/18/2017.     Allergy:  Allergies  Allergen Reactions  . Levofloxacin Palpitations    REACTION: tachycardia  . Moxifloxacin Palpitations    REACTION: tachycardia but tolerated cirpofloxacin just fine  . Stadol [Butorphanol] Other (See Comments)    Caused shakes for 6 weeks "shaking for weeks"  . Amoxicillin Rash    Can take  500 mg - amounts greater causes yeast infection REACTION: Rash with high doses. Can take lower doses like 581m  . Asa [Aspirin] Other (See Comments)    Pt stated the 81 mg caused Exacerbation of her asthma  - unsure about other asprins Asthma exacerbation  . Ciprofloxacin Hcl Nausea And Vomiting    Can take lower doses like 5017m Other causes yeast infection Can take lower doses like 50054m . Codeine Nausea Only and Nausea And Vomiting    REACTION: nausea  . Effexor [Venlafaxine] Other (See Comments)    hyperactivity Panic attacks  . Erythromycin Diarrhea and Nausea And Vomiting  . Latex Rash  . Losartan Potassium Nausea Only and Other (See Comments)    headache  . Paroxetine Other (See Comments)    Made nerves worse and caused fatigue  . Rocephin  [Ceftriaxone] Diarrhea    diarrhea  . Butorphanol Tartrate     Gave her the shakes for 6 weeks  . Cephalosporins     REACTION: she has tolerated rocephin and Keflex without difficulty  . Clindamycin/Lincomycin Other (See Comments)    Tears stomach up.  . Influenza Vaccines     Pt states that she got really sick from flu vaccine was sick a total of 6 weeks  . Losartan Other (See Comments)    Headache, nausea, fatigue  . Paxil [Paroxetine Hcl] Other (See Comments)    fatigue  . Clarithromycin Diarrhea  . Clindamycin Diarrhea  . Tape Rash    Social Hx:   Social History   Socioeconomic History  . Marital status: Married    Spouse name: Not on file  . Number of children: 0  . Years of education: Not on file  . Highest education level: Not on file  Social Needs  . Financial resource strain: Not on file  . Food insecurity - worry: Not on file  . Food insecurity - inability: Not on file  . Transportation needs - medical: Not on file  . Transportation needs - non-medical: Not on file  Occupational History  . Occupation: disabled  Tobacco Use  . Smoking status: Never Smoker  . Smokeless tobacco: Never Used  Substance and Sexual Activity  . Alcohol use: No  . Drug use: No  . Sexual activity: Not on file  Other Topics Concern  . Not on file  Social History Narrative  . Not on file    Past Surgical Hx:  Past Surgical History:  Procedure Laterality Date  . ABDOMINAL HYSTERECTOMY Bilateral 02/11/2015   Procedure: HYSTERECTOMY ABDOMINAL, bilateral salpingo-oophorectomy;  Surgeon: DavLouretta ShortenD;  [facility];  Service: Gynecology;  Laterality: Bilateral;  . DILATATION & CURETTAGE/HYSTEROSCOPY WITH TRUECLEAR N/A 08/17/2013   Procedure: DILATATION & CURETTAGE/HYSTEROSCOPY WITH TRUCLEAR;  Surgeon: DavLuz LexD;  Location[facility];  Service: Gynecology;  Laterality: N/A;  . DILATION AND CURETTAGE OF UTERUS    . MELANOMA EXCISION  06/2001   right leg; also removed 2 lymph  nodes  . OMENTECTOMY N/A 02/11/2015   Procedure: OMENTECTOMY;  Surgeon: DavLouretta ShortenD;  [facility];  Service: Gynecology;  Laterality: N/A;    Past Medical Hx:  Past Medical History:  Diagnosis Date  . Anxiety   . Arthritis   . Asthma   . Crohn disease ([place] . GERD (gastroesophageal reflux disease)   . Hypertension   . Insomnia   . Melanoma ([facility]  rt. dorsal leg  . Pyoderma gangrenosum   . Skin  cancer 2003   melanoma    Past Gynecological History:  See HPI  No LMP recorded (lmp unknown). Patient has had a hysterectomy.  Family Hx:  Family History  Problem Relation Age of Onset  . Colon polyps Father   . Diabetes Father   . Heart disease Paternal Uncle   . Asthma Mother   . Melanoma Maternal Aunt   . Diabetes Paternal Grandmother   . Alcohol abuse Paternal Grandfather   . Breast cancer Cousin        maternal first cousin  . Colon cancer Neg Hx     Review of Systems:  Constitutional  Feels well,    ENT Normal appearing ears and nares bilaterally Skin/Breast  No rash, sores, jaundice, itching, dryness Cardiovascular  + SOB and edema Pulmonary  +cough  Gastro Intestinal  No nausea, vomitting, or diarrhoea. No bright red blood per rectum, no abdominal pain, change in bowel movement, or constipation.  Genito Urinary  No frequency, urgency, dysuria, see HPI Musculo Skeletal  No myalgia, arthralgia, joint swelling or pain  Neurologic  No weakness, numbness, change in gait,  Psychology  No depression, anxiety, insomnia.   Vitals:  Blood pressure 127/68, pulse 60, temperature 97.6 F (36.4 C), temperature source Oral, resp. rate 18, height 5' 4"  (1.626 m), weight 275 lb 9.6 oz (125 kg), SpO2 98 %.  Physical Exam: WD in NAD Neck  Supple NROM, without any enlargements.  Lymph Node Survey No cervical supraclavicular or inguinal adenopathy Cardiovascular  Pulse normal rate, regularity and rhythm. S1 and S2 normal.  Lungs  Clear to auscultation  bilateraly, without wheezes/crackles/rhonchi. Good air movement.  Skin  No rash/lesions/breakdown  Psychiatry  Alert and oriented to person, place, and time  Abdomen  Normoactive bowel sounds, abdomen soft, non-tender and obese. Well healed low transverse incision.  Soft, reducible umbilical hernia. Back No CVA tenderness Genito Urinary  Vulva/vagina: Normal external female genitalia.  No lesions. No discharge or bleeding.  Bladder/urethra:  No lesions or masses, well supported bladder  Vagina: grossly normal with in tact cuff.  Cervix: surgically absent  Uterus: surgically absent    Adnexa: no palpable masses. Rectal  Good tone, no masses no cul de sac nodularity.  Extremities  No bilateral cyanosis, clubbing or edema.  20 minutes of face to face counseling time was spent with the patient.  Donaciano Eva, MD  05/18/2017, 2:22 PM

## 2017-05-18 NOTE — Patient Instructions (Addendum)
Please follow-up with Dr Denman George in 6 months. Please contact Dr Serita Grit office in April to make this appointment.   Please notify Dr Denman George at phone number [phone] if you notice vaginal bleeding, new pelvic or abdominal pains, bloating, feeling full easy, or a change in bladder or bowel function.   If your CA 125 measure is normal, Dr Serita Grit office will facilitate an appointment for port removal.   Recommended cardiologists are Dr Lacie Draft and Dr Acie Fredrickson. [facility] [address], [place]  [place], [place] [address]

## 2017-05-19 ENCOUNTER — Telehealth: Payer: Self-pay

## 2017-05-19 LAB — CA 125: CANCER ANTIGEN (CA) 125: 8.8 U/mL (ref 0.0–38.1)

## 2017-05-19 NOTE — Telephone Encounter (Signed)
Outgoing call to pt regarding her recent CA 125 results, no answer, Left VM for her to return our call, left contact info.

## 2017-05-20 NOTE — Telephone Encounter (Signed)
LM for [name] that her CA-125 was WNL at 8.8 on 05-18-17. She can call the office at [phone] if she has any questions or concerns.

## 2017-05-24 ENCOUNTER — Telehealth: Payer: Self-pay

## 2017-05-24 NOTE — Telephone Encounter (Signed)
SENT REFERRAL TO SCHEDULING

## 2017-05-31 ENCOUNTER — Telehealth (HOSPITAL_COMMUNITY): Payer: Self-pay | Admitting: Nurse Practitioner

## 2017-06-01 NOTE — Telephone Encounter (Signed)
User: Cherie Dark A Date/time: 05/31/17 12:12 PM  Comment: Called pt(went straight to VM) and lmsg for her to CB to sch echo.  Context:  Outcome: Left Message  Phone number: [phone] Phone Type: Home Phone  Comm. type: Telephone Call type: Outgoing  Contact: Verlan Friends C Relation to patient: Self

## 2017-06-02 ENCOUNTER — Telehealth (HOSPITAL_COMMUNITY): Payer: Self-pay | Admitting: Nurse Practitioner

## 2017-06-07 ENCOUNTER — Other Ambulatory Visit: Payer: Self-pay | Admitting: Nurse Practitioner

## 2017-06-07 ENCOUNTER — Other Ambulatory Visit (HOSPITAL_COMMUNITY): Payer: Medicare HMO

## 2017-06-07 DIAGNOSIS — R0602 Shortness of breath: Secondary | ICD-10-CM

## 2017-06-08 ENCOUNTER — Other Ambulatory Visit (HOSPITAL_COMMUNITY): Payer: Medicare HMO

## 2017-06-09 NOTE — Telephone Encounter (Signed)
User: Cherie Dark A Date/time: 06/02/17 11:28 AM  Comment: Called pt back and lmsg for her to CB to change appt date for echo.   Context:  Outcome: Left Message  Phone number: [phone] Phone Type: Home Phone  Comm. type: Telephone Call type: Outgoing  Contact: Verlan Friends C Relation to patient: Self

## 2017-06-13 ENCOUNTER — Ambulatory Visit (HOSPITAL_COMMUNITY): Payer: Medicare HMO | Attending: Cardiology

## 2017-06-13 ENCOUNTER — Other Ambulatory Visit: Payer: Self-pay

## 2017-06-13 DIAGNOSIS — Z8249 Family history of ischemic heart disease and other diseases of the circulatory system: Secondary | ICD-10-CM | POA: Diagnosis not present

## 2017-06-13 DIAGNOSIS — Z9221 Personal history of antineoplastic chemotherapy: Secondary | ICD-10-CM | POA: Insufficient documentation

## 2017-06-13 DIAGNOSIS — I119 Hypertensive heart disease without heart failure: Secondary | ICD-10-CM | POA: Diagnosis not present

## 2017-06-13 DIAGNOSIS — K5090 Crohn's disease, unspecified, without complications: Secondary | ICD-10-CM | POA: Diagnosis not present

## 2017-06-13 DIAGNOSIS — J45909 Unspecified asthma, uncomplicated: Secondary | ICD-10-CM | POA: Insufficient documentation

## 2017-06-13 DIAGNOSIS — Z8543 Personal history of malignant neoplasm of ovary: Secondary | ICD-10-CM | POA: Insufficient documentation

## 2017-06-13 DIAGNOSIS — R0602 Shortness of breath: Secondary | ICD-10-CM | POA: Insufficient documentation

## 2017-06-15 NOTE — Progress Notes (Deleted)
Cardiology Office Note   Date:  06/15/2017   ID:  [name], DOB Jan 21, 1961, MRN [id number]  PCP:  Arlyss Repress, MD  Cardiologist:   Jenkins Rouge, MD   No chief complaint on file.     History of Present Illness: [name] is a 57 y.o. female who presents for consultation regarding LE edema and dyspnea Referred by Dr Ilene Qua and Elijio Miles PA-C  Morbidly obese with ? History of asthma On Symbicort and chronic cough. CXR with borderline CE and she describes a vague history of cardiac valve issues. Reviewed office note from 05/17/17 an dRx doxycycline for bronchitis.   Echo reviewed from 06/13/17 Normal EF 44-31% Grade 2 diastolic  Mild RVE moderate LVH estimated PA 45 mmHg  Primary note 04/21/17 nostes history of asthma on Symbicort but not compliant due to cost Given samples of Breo Ellipta that isn't as effective Rx two rounds of doxycyline an steroids Gets better for a well than exertional dyspnea worsens. CXR normal heart size upper normal  Was supposed to be referred to pulmonary     Past Medical History:  Diagnosis Date  . Anxiety   . Arthritis   . Asthma   . Crohn disease ([place])   . GERD (gastroesophageal reflux disease)   . Hypertension   . Insomnia   . Melanoma ([place])    rt. dorsal leg  . Pyoderma gangrenosum   . Shortness of breath   . Skin cancer 2003   melanoma    Past Surgical History:  Procedure Laterality Date  . ABDOMINAL HYSTERECTOMY Bilateral 02/11/2015   Procedure: HYSTERECTOMY ABDOMINAL, bilateral salpingo-oophorectomy;  Surgeon: Louretta Shorten, MD;  Location: [facility];  Service: Gynecology;  Laterality: Bilateral;  . DILATATION & CURETTAGE/HYSTEROSCOPY WITH TRUECLEAR N/A 08/17/2013   Procedure: DILATATION & CURETTAGE/HYSTEROSCOPY WITH TRUCLEAR;  Surgeon: Luz Lex, MD;  Location: [facility];  Service: Gynecology;  Laterality: N/A;  . DILATION AND CURETTAGE OF UTERUS    . MELANOMA EXCISION  06/2001   right leg; also removed 2 lymph nodes  .  OMENTECTOMY N/A 02/11/2015   Procedure: OMENTECTOMY;  Surgeon: Louretta Shorten, MD;  Location: [facility];  Service: Gynecology;  Laterality: N/A;     Current Outpatient Medications  Medication Sig Dispense Refill  . albuterol (PROVENTIL HFA;VENTOLIN HFA) 108 (90 Base) MCG/ACT inhaler Inhale 2 puffs into the lungs every 4 (four) hours as needed for wheezing or shortness of breath. 1 Inhaler 2  . alprazolam (XANAX) 2 MG tablet Take 2 mg by mouth 2 (two) times daily. 1/2 tab AM and 1 tab PM    . amLODipine (NORVASC) 2.5 MG tablet Take 2.5 mg by mouth.    . Ascorbic Acid (VITAMIN C) 1000 MG tablet Take 1,000 mg by mouth daily.    . budesonide-formoterol (SYMBICORT) 160-4.5 MCG/ACT inhaler Inhale 2 puffs into the lungs 2 (two) times daily. 1 Inhaler 3  . Cholecalciferol (VITAMIN D3) 5000 UNITS TABS Take 1 tablet by mouth daily. Reported on 06/26/2015    . hydroxypropyl methylcellulose / hypromellose (ISOPTO TEARS / GONIOVISC) 2.5 % ophthalmic solution Place 2 drops into both eyes as needed for dry eyes. Reported on 08/25/2015    . ibuprofen (ADVIL,MOTRIN) 600 MG tablet Take 1 tablet (600 mg total) by mouth daily as needed (mild pain). 20 tablet 0  . ketoconazole (NIZORAL) 2 % cream Apply 1 application topically daily as needed for irritation. Reported on 05/08/2015    . Lactobacillus (ACIDOPHILUS PO) Take 2 capsules  by mouth daily.     Marland Kitchen levocetirizine (XYZAL) 5 MG tablet Take 5 mg by mouth every morning.     . lidocaine-prilocaine (EMLA) cream Apply to Porta-cath site 1-2 hours prior to access as directed. 30 g 1  . methocarbamol (ROBAXIN) 500 MG tablet TK 1 T PO Q 8 H PRF MUSCLE PAIN / RELAXATION  1  . montelukast (SINGULAIR) 10 MG tablet Take 10 mg by mouth at bedtime. Reported on 10/20/2015    . nystatin (MYCOSTATIN/NYSTOP) 100000 UNIT/GM POWD Apply powder to groin twice a day. 30 g 3  . Omega-3 Fatty Acids (FISH OIL) 1000 MG CAPS Take 2 capsules by mouth daily. Reported on 06/26/2015    . promethazine  (PHENERGAN) 25 MG tablet Take 25 mg by mouth every 6 (six) hours as needed for nausea. Reported on 10/20/2015    . ranitidine (ZANTAC) 150 MG tablet Take 150 mg by mouth 2 (two) times daily. Reported on 05/08/2015    . sertraline (ZOLOFT) 50 MG tablet Take 50 mg by mouth daily.    . SYMBICORT 160-4.5 MCG/ACT inhaler INHALE 2 PUFFS BY MOUTH TWICE DAILY 10.2 g 1  . triamcinolone cream (KENALOG) 0.1 % Apply 1 application topically 2 (two) times daily as needed. Reported on 08/25/2015    . triamterene-hydrochlorothiazide (MAXZIDE) 75-50 MG per tablet Take 1 tablet by mouth daily. Reported on 06/12/2015    . vitamin E 400 UNIT capsule Take 400 Units by mouth daily. Reported on 06/26/2015     No current facility-administered medications for this visit.     Allergies:   Levofloxacin; Moxifloxacin; Stadol [butorphanol]; Amoxicillin; Asa [aspirin]; Ciprofloxacin hcl; Codeine; Effexor [venlafaxine]; Erythromycin; Latex; Losartan potassium; Paroxetine; Rocephin [ceftriaxone]; Butorphanol tartrate; Cephalosporins; Clindamycin/lincomycin; Influenza vaccines; Losartan; Paxil [paroxetine hcl]; Clarithromycin; Clindamycin; and Tape    Social History:  The patient  reports that  has never smoked. she has never used smokeless tobacco. She reports that she does not drink alcohol or use drugs.   Family History:  The patient's family history includes Alcohol abuse in her paternal grandfather; Asthma in her mother; Breast cancer in her cousin; Colon polyps in her father; Diabetes in her father and paternal grandmother; Heart disease in her paternal uncle; Melanoma in her maternal aunt.    ROS:  Please see the history of present illness.   Otherwise, review of systems are positive for none.   All other systems are reviewed and negative.    PHYSICAL EXAM: VS:  LMP  (LMP Unknown)  , BMI There is no height or weight on file to calculate BMI. Affect appropriate Healthy:  appears stated age 67: normal Neck supple with  no adenopathy JVP normal no bruits no thyromegaly Lungs clear with no wheezing and good diaphragmatic motion Heart:  S1/S2 no murmur, no rub, gallop or click PMI normal Abdomen: benighn, BS positve, no tenderness, no AAA no bruit.  No HSM or HJR Distal pulses intact with no bruits No edema Neuro non-focal Skin warm and dry No muscular weakness    EKG:  02/06/15 SR rate 70 normal    Recent Labs: 06/23/2016: ALT 26; BUN 30.6; Creatinine 1.0; HGB 11.5; Platelets 202; Potassium 3.7; Sodium 138    Lipid Panel No results found for: CHOL, TRIG, HDL, CHOLHDL, VLDL, LDLCALC, LDLDIRECT    Wt Readings from Last 3 Encounters:  05/18/17 275 lb 9.6 oz (125 kg)  02/16/17 281 lb 4.8 oz (127.6 kg)  10/27/16 278 lb (126.1 kg)      Other studies  Reviewed: Additional studies/ records that were reviewed today include: Primary care notes echo .    ASSESSMENT AND PLAN:  1.  Dyspnea 2. Edema 3. Asthma 4. Obesity    Current medicines are reviewed at length with the patient today.  The patient does not have concerns regarding medicines.  The following changes have been made:  ***  Labs/ tests ordered today include: *** No orders of the defined types were placed in this encounter.    Disposition:   FU with cardiology PRN      Signed, Jenkins Rouge, MD  06/15/2017 4:25 PM    [facility] [place], [place], [place]  [address] Phone: [phone]; Fax: [phone]

## 2017-06-21 ENCOUNTER — Ambulatory Visit: Payer: Medicare HMO | Admitting: Cardiovascular Disease

## 2017-06-23 ENCOUNTER — Other Ambulatory Visit: Payer: Self-pay | Admitting: Gynecologic Oncology

## 2017-06-23 ENCOUNTER — Telehealth: Payer: Self-pay | Admitting: *Deleted

## 2017-06-23 DIAGNOSIS — Z95828 Presence of other vascular implants and grafts: Secondary | ICD-10-CM

## 2017-06-23 NOTE — Progress Notes (Signed)
Patient called wanting her port a cath removed.  Order placed.

## 2017-06-23 NOTE — Telephone Encounter (Signed)
Patient called on Tuesday " I wanted to iqured about having her port removed, and if I have to have it flushed before removal?" Advised the patient I would check on that procedure for her and call her back.   Spoke with Melissa APP and she will place an order to have the port removed. Also spoke with a Paramedic and the port doesn't have to be flushed before removal.  Called and left the patient a message with the above information

## 2017-07-04 ENCOUNTER — Other Ambulatory Visit: Payer: Self-pay | Admitting: Radiology

## 2017-07-05 ENCOUNTER — Ambulatory Visit (HOSPITAL_COMMUNITY)
Admission: RE | Admit: 2017-07-05 | Discharge: 2017-07-05 | Disposition: A | Discharge status: Home / Self Care | Payer: Medicare HMO | Source: Ambulatory Visit | Attending: Gynecologic Oncology | Admitting: Gynecologic Oncology

## 2017-07-05 ENCOUNTER — Encounter (HOSPITAL_COMMUNITY): Payer: Self-pay

## 2017-07-05 DIAGNOSIS — Z91040 Latex allergy status: Secondary | ICD-10-CM | POA: Insufficient documentation

## 2017-07-05 DIAGNOSIS — Z452 Encounter for adjustment and management of vascular access device: Secondary | ICD-10-CM | POA: Diagnosis present

## 2017-07-05 DIAGNOSIS — Z880 Allergy status to penicillin: Secondary | ICD-10-CM | POA: Insufficient documentation

## 2017-07-05 DIAGNOSIS — Z885 Allergy status to narcotic agent status: Secondary | ICD-10-CM | POA: Diagnosis not present

## 2017-07-05 DIAGNOSIS — Z9221 Personal history of antineoplastic chemotherapy: Secondary | ICD-10-CM | POA: Insufficient documentation

## 2017-07-05 DIAGNOSIS — K219 Gastro-esophageal reflux disease without esophagitis: Secondary | ICD-10-CM | POA: Diagnosis not present

## 2017-07-05 DIAGNOSIS — G4700 Insomnia, unspecified: Secondary | ICD-10-CM | POA: Insufficient documentation

## 2017-07-05 DIAGNOSIS — J45909 Unspecified asthma, uncomplicated: Secondary | ICD-10-CM | POA: Insufficient documentation

## 2017-07-05 DIAGNOSIS — Z95828 Presence of other vascular implants and grafts: Secondary | ICD-10-CM

## 2017-07-05 DIAGNOSIS — Z8543 Personal history of malignant neoplasm of ovary: Secondary | ICD-10-CM | POA: Diagnosis not present

## 2017-07-05 DIAGNOSIS — K5090 Crohn's disease, unspecified, without complications: Secondary | ICD-10-CM | POA: Insufficient documentation

## 2017-07-05 DIAGNOSIS — I10 Essential (primary) hypertension: Secondary | ICD-10-CM | POA: Diagnosis not present

## 2017-07-05 DIAGNOSIS — F419 Anxiety disorder, unspecified: Secondary | ICD-10-CM | POA: Diagnosis not present

## 2017-07-05 DIAGNOSIS — M1990 Unspecified osteoarthritis, unspecified site: Secondary | ICD-10-CM | POA: Insufficient documentation

## 2017-07-05 DIAGNOSIS — Z7951 Long term (current) use of inhaled steroids: Secondary | ICD-10-CM | POA: Diagnosis not present

## 2017-07-05 HISTORY — PX: IR REMOVAL TUN ACCESS W/ PORT W/O FL MOD SED: IMG2290

## 2017-07-05 LAB — PROTIME-INR
INR: 0.93
Prothrombin Time: 12.4 seconds (ref 11.4–15.2)

## 2017-07-05 LAB — BASIC METABOLIC PANEL
ANION GAP: 10 (ref 5–15)
BUN: 25 mg/dL — AB (ref 6–20)
CALCIUM: 9 mg/dL (ref 8.9–10.3)
CO2: 26 mmol/L (ref 22–32)
Chloride: 103 mmol/L (ref 101–111)
Creatinine, Ser: 0.84 mg/dL (ref 0.44–1.00)
GFR calc Af Amer: >60 mL/min (ref >60)
GLUCOSE: 88 mg/dL (ref 65–99)
Potassium: 4.4 mmol/L (ref 3.5–5.1)
Sodium: 139 mmol/L (ref 135–145)

## 2017-07-05 LAB — CBC WITH DIFFERENTIAL/PLATELET
BASOS ABS: 0 10*3/uL (ref 0.0–0.1)
BASOS PCT: 0 %
EOS ABS: 0.1 10*3/uL (ref 0.0–0.7)
EOS PCT: 1 %
HEMATOCRIT: 34.4 % — AB (ref 36.0–46.0)
Hemoglobin: 11.1 g/dL — ABNORMAL LOW (ref 12.0–15.0)
Lymphocytes Relative: 21 %
Lymphs Abs: 1.1 10*3/uL (ref 0.7–4.0)
MCH: 28.9 pg (ref 26.0–34.0)
MCHC: 32.3 g/dL (ref 30.0–36.0)
MCV: 89.6 fL (ref 78.0–100.0)
MONO ABS: 0.5 10*3/uL (ref 0.1–1.0)
MONOS PCT: 10 %
Neutro Abs: 3.6 10*3/uL (ref 1.7–7.7)
Neutrophils Relative %: 68 %
PLATELETS: 243 10*3/uL (ref 150–400)
RBC: 3.84 MIL/uL — ABNORMAL LOW (ref 3.87–5.11)
RDW: 14.3 % (ref 11.5–15.5)
WBC: 5.3 10*3/uL (ref 4.0–10.5)

## 2017-07-05 MED ORDER — FENTANYL CITRATE (PF) 100 MCG/2ML IJ SOLN
INTRAMUSCULAR | Status: AC | PRN
  Administered 2017-07-05 (×2): 50 ug via INTRAVENOUS

## 2017-07-05 MED ORDER — LIDOCAINE HCL 1 % IJ SOLN
INTRAMUSCULAR | Status: AC
  Filled 2017-07-05: qty 20

## 2017-07-05 MED ORDER — VANCOMYCIN HCL 10 G IV SOLR
1500.0000 mg | INTRAVENOUS | Status: AC
  Administered 2017-07-05: 1500 mg via INTRAVENOUS
  Filled 2017-07-05: qty 1500

## 2017-07-05 MED ORDER — SODIUM CHLORIDE 0.9 % IV SOLN
INTRAVENOUS | Status: DC
  Administered 2017-07-05: 12:00:00 via INTRAVENOUS

## 2017-07-05 MED ORDER — MIDAZOLAM HCL 2 MG/2ML IJ SOLN
INTRAMUSCULAR | Status: AC | PRN
  Administered 2017-07-05 (×2): 1 mg via INTRAVENOUS

## 2017-07-05 MED ORDER — MIDAZOLAM HCL 2 MG/2ML IJ SOLN
INTRAMUSCULAR | Status: AC
  Filled 2017-07-05: qty 2

## 2017-07-05 MED ORDER — FENTANYL CITRATE (PF) 100 MCG/2ML IJ SOLN
INTRAMUSCULAR | Status: AC
  Filled 2017-07-05: qty 2

## 2017-07-05 MED ORDER — LIDOCAINE HCL (PF) 1 % IJ SOLN
INTRAMUSCULAR | Status: AC | PRN
  Administered 2017-07-05: 20 mL

## 2017-07-05 NOTE — H&P (Addendum)
Referring Physician(s): Cross,Melissa D/Rossi,E  Supervising Physician: Corrie Mckusick  Patient Status:  [facility]  Chief Complaint:  "I'm getting my port out"  Subjective: Patient familiar to IR service from prior right chest wall Port-A-Cath placement on 05/06/15.  She has a history of ovarian cancer, status post surgery and chemotherapy.  She has completed treatment and has no evidence of recurrence on exam with normal CA 125 as well.  She presents today for Port-A-Cath removal.  He currently denies fever, chest pain, worsening dyspnea, cough, abdominal pain, nausea, vomiting or bleeding.  She does have occasional headaches and some intermittent back pain. Past Medical History:  Diagnosis Date  . Anxiety   . Arthritis   . Asthma   . Crohn disease ([place])   . GERD (gastroesophageal reflux disease)   . Hypertension   . Insomnia   . Melanoma ([place])    rt. dorsal leg  . Pyoderma gangrenosum   . Shortness of breath   . Skin cancer 2003   melanoma   Past Surgical History:  Procedure Laterality Date  . ABDOMINAL HYSTERECTOMY Bilateral 02/11/2015   Procedure: HYSTERECTOMY ABDOMINAL, bilateral salpingo-oophorectomy;  Surgeon: Louretta Shorten, MD;  Location: [facility];  Service: Gynecology;  Laterality: Bilateral;  . DILATATION & CURETTAGE/HYSTEROSCOPY WITH TRUECLEAR N/A 08/17/2013   Procedure: DILATATION & CURETTAGE/HYSTEROSCOPY WITH TRUCLEAR;  Surgeon: Luz Lex, MD;  Location: [facility];  Service: Gynecology;  Laterality: N/A;  . DILATION AND CURETTAGE OF UTERUS    . MELANOMA EXCISION  06/2001   right leg; also removed 2 lymph nodes  . OMENTECTOMY N/A 02/11/2015   Procedure: OMENTECTOMY;  Surgeon: Louretta Shorten, MD;  Location: [facility];  Service: Gynecology;  Laterality: N/A;       Allergies: Levofloxacin; Moxifloxacin; Stadol [butorphanol]; Amoxicillin; Asa [aspirin]; Ciprofloxacin hcl; Codeine; Effexor [venlafaxine]; Erythromycin; Latex; Losartan potassium; Paroxetine; Rocephin [ceftriaxone];  Butorphanol tartrate; Cephalosporins; Clindamycin/lincomycin; Influenza vaccines; Losartan; Paxil [paroxetine hcl]; Clarithromycin; Clindamycin; and Tape  Medications: Prior to Admission medications   Medication Sig Start Date End Date Taking? Authorizing Provider  alprazolam Duanne Moron) 2 MG tablet Take 2 mg by mouth 2 (two) times daily. 1/2 tab AM and 1 tab PM   Yes [provider]  Ascorbic Acid (VITAMIN C) 1000 MG tablet Take 1,000 mg by mouth daily.   Yes [provider]  Cholecalciferol (VITAMIN D3) 5000 UNITS TABS Take 1 tablet by mouth daily. Reported on 06/26/2015   Yes [provider]  ibuprofen (ADVIL,MOTRIN) 600 MG tablet Take 1 tablet (600 mg total) by mouth daily as needed (mild pain). 10/01/15  Yes Livesay, Lennis P, MD  Lactobacillus (ACIDOPHILUS PO) Take 2 capsules by mouth daily.    Yes [provider]  methocarbamol (ROBAXIN) 500 MG tablet TK 1 T PO Q 8 H PRF MUSCLE PAIN / RELAXATION 03/01/16  Yes [provider]  montelukast (SINGULAIR) 10 MG tablet Take 10 mg by mouth at bedtime. Reported on 10/20/2015   Yes [provider]  Omega-3 Fatty Acids (FISH OIL) 1000 MG CAPS Take 2 capsules by mouth daily. Reported on 06/26/2015   Yes [provider]  promethazine (PHENERGAN) 25 MG tablet Take 25 mg by mouth every 6 (six) hours as needed for nausea. Reported on 10/20/2015   Yes [provider]  ranitidine (ZANTAC) 150 MG tablet Take 150 mg by mouth 2 (two) times daily. Reported on 05/08/2015 05/14/11  Yes [provider]  sertraline (ZOLOFT) 50 MG tablet Take 50 mg by mouth daily.  Yes [provider]  SYMBICORT 160-4.5 MCG/ACT inhaler INHALE 2 PUFFS BY MOUTH TWICE DAILY 06/27/15  Yes Livesay, Lennis P, MD  triamterene-hydrochlorothiazide (MAXZIDE) 75-50 MG per tablet Take 1 tablet by mouth daily. Reported on 06/12/2015   Yes [provider]  vitamin E 400 UNIT capsule Take 400 Units by mouth daily.  Reported on 06/26/2015   Yes [provider]  albuterol (PROVENTIL HFA;VENTOLIN HFA) 108 (90 Base) MCG/ACT inhaler Inhale 2 puffs into the lungs every 4 (four) hours as needed for wheezing or shortness of breath. 06/23/16   Everitt Amber, MD  amLODipine (NORVASC) 2.5 MG tablet Take 2.5 mg by mouth. 04/26/16 04/26/17  [provider]  budesonide-formoterol (SYMBICORT) 160-4.5 MCG/ACT inhaler Inhale 2 puffs into the lungs 2 (two) times daily. 06/23/16 06/23/17  Everitt Amber, MD  hydroxypropyl methylcellulose / hypromellose (ISOPTO TEARS / GONIOVISC) 2.5 % ophthalmic solution Place 2 drops into both eyes as needed for dry eyes. Reported on 08/25/2015    [provider]  ketoconazole (NIZORAL) 2 % cream Apply 1 application topically daily as needed for irritation. Reported on 05/08/2015    [provider]  levocetirizine (XYZAL) 5 MG tablet Take 5 mg by mouth every morning.     [provider]  lidocaine-prilocaine (EMLA) cream Apply to Porta-cath site 1-2 hours prior to access as directed. 04/25/15   Livesay, Tamala Julian, MD  nystatin (MYCOSTATIN/NYSTOP) 100000 UNIT/GM POWD Apply powder to groin twice a day. 04/10/15   Livesay, Tamala Julian, MD  triamcinolone cream (KENALOG) 0.1 % Apply 1 application topically 2 (two) times daily as needed. Reported on 08/25/2015    [provider]     Vital Signs: BP (!) 151/71   Pulse 61   Temp 98.3 F (36.8 C) (Oral)   Resp 18   Wt 264 lb (119.7 kg)   LMP  (LMP Unknown)   SpO2 99%   BMI 45.32 kg/m   Physical Exam awake, alert.  Chest clear to auscultation bilaterally. Clean, intact right chest wall Port-A-Cath.  Heart with regular rate and rhythm.  abd-obese, soft, positive bowel sounds, currently nontender.  Extremities with full range of motion.  Imaging: No results found.  Labs:  CBC: Recent Labs    07/05/17 1146  WBC 5.3  HGB 11.1*  HCT 34.4*  PLT 243    COAGS: No results for input(s): INR, APTT in the  last 8760 hours.  BMP: No results for input(s): NA, K, CL, CO2, GLUCOSE, BUN, CALCIUM, CREATININE, GFRNONAA, GFRAA in the last 8760 hours.  Invalid input(s): CMP  LIVER FUNCTION TESTS: No results for input(s): BILITOT, AST, ALT, ALKPHOS, PROT, ALBUMIN in the last 8760 hours.  Assessment and Plan: Patient with history of ovarian cancer, status post surgery and chemotherapy; s/p right chest wall Port-A-Cath placement on 05/06/15.  She has completed treatment and has no evidence of recurrence on exam.  She presents today for Port-A-Cath removal.  Details/risks of procedure, including but not limited to, internal bleeding, infection, injury to adjacent structures discussed with patient and husband with their understanding and consent.   Electronically Signed: D. Rowe Robert, PA-C 07/05/2017, 12:50 PM   I spent a total of 20 minutes at the the patient's bedside AND on the patient's hospital floor or unit, greater than 50% of which was counseling/coordinating care for Port-A-Cath removal

## 2017-07-05 NOTE — Procedures (Signed)
Interventional Radiology Procedure Note  Procedure: removal of a right IJ approach single lumen PowerPort.   Complications: None Recommendations:  - Ok to shower tomorrow - Do not submerge for 7 days - Routine wound care   Signed,  Dulcy Fanny. Earleen Newport, DO

## 2017-07-05 NOTE — Discharge Instructions (Signed)
Moderate Conscious Sedation, Adult, Care After These instructions provide you with information about caring for yourself after your procedure. Your health care provider may also give you more specific instructions. Your treatment has been planned according to current medical practices, but problems sometimes occur. Call your health care provider if you have any problems or questions after your procedure. What can I expect after the procedure? After your procedure, it is common:  To feel sleepy for several hours.  To feel clumsy and have poor balance for several hours.  To have poor judgment for several hours.  To vomit if you eat too soon.  Follow these instructions at home: For at least 24 hours after the procedure:   Do not: ? Participate in activities where you could fall or become injured. ? Drive. ? Use heavy machinery. ? Drink alcohol. ? Take sleeping pills or medicines that cause drowsiness. ? Make important decisions or sign legal documents. ? Take care of children on your own.  Rest. Eating and drinking  Follow the diet recommended by your health care provider.  If you vomit: ? Drink water, juice, or soup when you can drink without vomiting. ? Make sure you have little or no nausea before eating solid foods. General instructions  Have a responsible adult stay with you until you are awake and alert.  Take over-the-counter and prescription medicines only as told by your health care provider.  If you smoke, do not smoke without supervision.  Keep all follow-up visits as told by your health care provider. This is important. Contact a health care provider if:  You keep feeling nauseous or you keep vomiting.  You feel light-headed.  You develop a rash.  You have a fever. Get help right away if:  You have trouble breathing. This information is not intended to replace advice given to you by your health care provider. Make sure you discuss any questions you have  with your health care provider. Document Released: 01/17/2013 Document Revised: 09/01/2015 Document Reviewed: 07/19/2015 Elsevier Interactive Patient Education  2018 [place] Removal, Care After Refer to this sheet in the next few weeks. These instructions provide you with information about caring for yourself after your procedure. Your health care provider may also give you more specific instructions. Your treatment has been planned according to current medical practices, but problems sometimes occur. Call your health care provider if you have any problems or questions after your procedure. What can I expect after the procedure? After the procedure, it is common to have:  Soreness or pain near your incision.  Some swelling or bruising near your incision.  Follow these instructions at home: Medicines  Take over-the-counter and prescription medicines only as told by your health care provider.  If you were prescribed an antibiotic medicine, take it as told by your health care provider. Do not stop taking the antibiotic even if you start to feel better. Bathing  Do not take baths, swim, or use a hot tub until your health care provider approves. Ask your health care provider if you can take showers. You may only be allowed to take sponge baths for bathing.  You may shower tomorrow. Incision care  Follow instructions from your health care provider about how to take care of your incision. Make sure you: ? Wash your hands with soap and water before you change your bandage (dressing). If soap and water are not available, use hand sanitizer. ? Change your dressing as told by your  health care provider.  You may remove your dressing tomorrow. ? Keep your dressing dry. ? Leave stitches (sutures), skin glue, or adhesive strips in place. These skin closures may need to stay in place for 2 weeks or longer. If adhesive strip edges start to loosen and curl up, you may trim the  loose edges. Do not remove adhesive strips completely unless your health care provider tells you to do that.  Check your incision area every day for signs of infection. Check for: ? More redness, swelling, or pain. ? More fluid or blood. ? Warmth. ? Pus or a bad smell. Driving  If you received a sedative, do not drive for 24 hours after the procedure.  If you did not receive a sedative, ask your health care provider when it is safe to drive. Activity  Return to your normal activities as told by your health care provider. Ask your health care provider what activities are safe for you.  Until your health care provider says it is safe: ? Do not lift anything that is heavier than 10 lb (4.5 kg). ? Do not do activities that involve lifting your arms over your head. General instructions  Do not use any tobacco products, such as cigarettes, chewing tobacco, and e-cigarettes. Tobacco can delay healing. If you need help quitting, ask your health care provider.  Keep all follow-up visits as told by your health care provider. This is important. Contact a health care provider if:  You have more redness, swelling, or pain around your incision.  You have more fluid or blood coming from your incision.  Your incision feels warm to the touch.  You have pus or a bad smell coming from your incision.  You have a fever.  You have pain that is not relieved by your pain medicine. Get help right away if:  You have chest pain.  You have difficulty breathing. This information is not intended to replace advice given to you by your health care provider. Make sure you discuss any questions you have with your health care provider. Document Released: 03/10/2015 Document Revised: 09/04/2015 Document Reviewed: 01/01/2015 Elsevier Interactive Patient Education  [employer].

## 2017-08-01 ENCOUNTER — Ambulatory Visit: Payer: Medicare HMO | Admitting: Cardiovascular Disease

## 2017-09-15 ENCOUNTER — Telehealth: Payer: Self-pay | Admitting: *Deleted

## 2017-09-15 NOTE — Telephone Encounter (Signed)
Patient called and requested an appt for a check up. Patient stated "I had an umbilical hernia surgery about a month and half ago. I'm having a different type of pain now similar to like before with my cancer. I would like to be seen and checked out." Explained to he patient that I will talk with [facility] and call her tomorrow."

## 2017-09-16 ENCOUNTER — Other Ambulatory Visit: Payer: Self-pay | Admitting: Gynecologic Oncology

## 2017-09-16 ENCOUNTER — Telehealth: Payer: Self-pay | Admitting: *Deleted

## 2017-09-16 DIAGNOSIS — C562 Malignant neoplasm of left ovary: Secondary | ICD-10-CM

## 2017-09-16 DIAGNOSIS — R1030 Lower abdominal pain, unspecified: Secondary | ICD-10-CM

## 2017-09-16 NOTE — Progress Notes (Signed)
Patient called the office and stated she is having new abdominal pain symptoms similar to what she experienced when she first found out she had cancer.  She had a hernia repair a month and a half ago.  CT scan ordered along with CA 125 to evaluate for recurrence.

## 2017-09-16 NOTE — Telephone Encounter (Signed)
Called and left the patient a message with the date/time of the appts for June 11th. Also left the instructions on the voicemail

## 2017-09-19 ENCOUNTER — Telehealth: Payer: Self-pay

## 2017-09-19 ENCOUNTER — Telehealth: Payer: Self-pay | Admitting: *Deleted

## 2017-09-19 NOTE — Telephone Encounter (Signed)
Returned pt's call - she left a VM asking for [facility].  Per Sharyn Lull, CT has been approved.  No answer, left pt VM to let her know CT was approved and reminder of appts tomorrow lab/ CT.  Left our contact info if has questions.

## 2017-09-19 NOTE — Telephone Encounter (Signed)
Called and left the patient a message that her CT scan was approved for tomorrow

## 2017-09-19 NOTE — Telephone Encounter (Signed)
Patient called and cancelled her appt for tomorrow. Patient has no transportation. Patient will call and reschedule her scan once she has transportation.

## 2017-09-20 ENCOUNTER — Other Ambulatory Visit: Payer: Medicare HMO

## 2017-09-20 ENCOUNTER — Encounter (HOSPITAL_COMMUNITY): Payer: Self-pay

## 2017-09-20 ENCOUNTER — Ambulatory Visit (HOSPITAL_COMMUNITY): Payer: Medicare HMO

## 2017-10-24 MED ORDER — FUROSEMIDE 40 MG PO TABS
40.00 | ORAL_TABLET | ORAL | Status: DC
Start: 2017-10-23 — End: 2017-10-24

## 2017-10-24 MED ORDER — APIXABAN 5 MG PO TABS
10.00 | ORAL_TABLET | ORAL | Status: DC
Start: 2017-10-23 — End: 2017-10-24

## 2017-10-24 MED ORDER — POLYETHYLENE GLYCOL 3350 17 G PO PACK
17.00 | PACK | ORAL | Status: DC
Start: 2017-10-24 — End: 2017-10-24

## 2017-10-24 MED ORDER — ALPRAZOLAM 0.5 MG PO TABS
0.50 | ORAL_TABLET | ORAL | Status: DC
Start: 2017-10-23 — End: 2017-10-24

## 2017-10-24 MED ORDER — NITROGLYCERIN 0.4 MG SL SUBL
0.40 | SUBLINGUAL_TABLET | SUBLINGUAL | Status: DC
Start: ? — End: 2017-10-24

## 2017-10-24 MED ORDER — GENERIC EXTERNAL MEDICATION
Status: DC
Start: ? — End: 2017-10-24

## 2017-10-24 MED ORDER — MELATONIN 1 MG PO TABS
2.00 | ORAL_TABLET | ORAL | Status: DC
Start: 2017-10-23 — End: 2017-10-24

## 2017-10-24 MED ORDER — SODIUM CHLORIDE 0.9 % IV SOLN
10.00 | INTRAVENOUS | Status: DC
Start: ? — End: 2017-10-24

## 2017-10-24 MED ORDER — ACETAMINOPHEN 325 MG PO TABS
650.00 | ORAL_TABLET | ORAL | Status: DC
Start: ? — End: 2017-10-24

## 2017-10-24 MED ORDER — RISPERIDONE 1 MG PO TBDP
1.00 | ORAL_TABLET | ORAL | Status: DC
Start: 2017-10-23 — End: 2017-10-24

## 2017-10-24 MED ORDER — DOXYCYCLINE MONOHYDRATE 100 MG PO CAPS
100.00 | ORAL_CAPSULE | ORAL | Status: DC
Start: 2017-10-23 — End: 2017-10-24

## 2017-10-24 MED ORDER — LABETALOL HCL 5 MG/ML IV SOLN
20.00 | INTRAVENOUS | Status: DC
Start: ? — End: 2017-10-24

## 2017-10-24 MED ORDER — MONTELUKAST SODIUM 10 MG PO TABS
10.00 | ORAL_TABLET | ORAL | Status: DC
Start: 2017-10-23 — End: 2017-10-24

## 2017-10-24 MED ORDER — SENNA-DOCUSATE SODIUM 8.6-50 MG PO TABS
1.00 | ORAL_TABLET | ORAL | Status: DC
Start: ? — End: 2017-10-24

## 2017-10-24 MED ORDER — ALBUTEROL SULFATE (2.5 MG/3ML) 0.083% IN NEBU
2.50 | INHALATION_SOLUTION | RESPIRATORY_TRACT | Status: DC
Start: ? — End: 2017-10-24

## 2017-10-24 MED ORDER — SERTRALINE HCL 100 MG PO TABS
100.00 | ORAL_TABLET | ORAL | Status: DC
Start: 2017-10-24 — End: 2017-10-24

## 2017-10-24 MED ORDER — ALUM & MAG HYDROXIDE-SIMETH 200-200-20 MG/5ML PO SUSP
30.00 | ORAL | Status: DC
Start: ? — End: 2017-10-24

## 2017-10-24 MED ORDER — GUAIFENESIN 100 MG/5ML PO SYRP
200.00 | ORAL_SOLUTION | ORAL | Status: DC
Start: ? — End: 2017-10-24

## 2017-10-24 MED ORDER — BUDESONIDE-FORMOTEROL FUMARATE 160-4.5 MCG/ACT IN AERO
2.00 | INHALATION_SPRAY | RESPIRATORY_TRACT | Status: DC
Start: 2017-10-23 — End: 2017-10-24

## 2017-10-25 ENCOUNTER — Telehealth: Payer: Self-pay

## 2017-10-25 NOTE — Telephone Encounter (Signed)
Since lower abdominal pain is continuing pt will have a CT scan of her A/P to evaluate for recurrent disease. Pt wanted to have appointment in ~2 weeks. Told Pt. That her CT is scheduled at [place] at 0900 on 11-09-17. NPO after 0500 on 11-09-08. She needs to arrive at 0700  to drink the water based contrast and have an I Stat creatine. Pt verbalized understanding.

## 2017-11-07 ENCOUNTER — Telehealth: Payer: Self-pay | Admitting: *Deleted

## 2017-11-08 ENCOUNTER — Telehealth: Payer: Self-pay | Admitting: *Deleted

## 2017-11-08 NOTE — Telephone Encounter (Signed)
Patient called last night and left a message. Patient stated "I still am in a lot of pain, and very SOB. I don't know why, maybe stress. I really need that CT scan. I don't want to go to any of local hospitals here in [place]. Didn't know if you could send an ambulance to get me. If I don't have a breathing doctor there will they still see me. Please call me first thing in the morning." Called patient back and left a message to call us back. Left message "Yes any of the ED will see you without a breathing doctor.

## 2017-11-08 NOTE — Telephone Encounter (Signed)
Called and spoke with the patient. She stated that "I am still some short of breath but stuffy too. I'm also constipated." Encouraged the patient to go to the ER for help. Patient given both the names of [facility] and [facility]. Patient stated "she is going to try and keep her CT scan tomorrow." Patient given the number to scheduling in case she needs to reschedule.

## 2017-11-09 ENCOUNTER — Ambulatory Visit (HOSPITAL_COMMUNITY): Admission: RE | Admit: 2017-11-09 | Payer: Medicare HMO | Source: Ambulatory Visit

## 2017-11-09 NOTE — Telephone Encounter (Signed)
Open by mistake

## 2017-11-11 ENCOUNTER — Telehealth: Payer: Self-pay | Admitting: *Deleted

## 2017-11-11 NOTE — Telephone Encounter (Signed)
Patient called to schedule and appointment to see Dr. Denman George for a six month follow-up. Patient states " I have a scan on August 8th and 10am and I wanted to see if I could see Dr. Denman George on that day".  I scheduled patient for her follow up appointment with Dr. Denman George on August 8th at 11:15am.  I told the patient that the CT scan results may not be available immediately, but we would call her with the results if they are not ready when she comes for the visit. Patient also had some concerns about the CT scan. Patient ask for the number to radiology so that she could speak with someone to see what the procedure will consist of. Patient states : " I am funny when it comes to test like this".  I gave patient the main number to radiology [phone]. Patient was very appreciative and verbalized understanding.

## 2017-11-12 ENCOUNTER — Encounter (HOSPITAL_COMMUNITY): Payer: Self-pay | Admitting: *Deleted

## 2017-11-12 ENCOUNTER — Other Ambulatory Visit: Payer: Self-pay

## 2017-11-12 ENCOUNTER — Emergency Department (HOSPITAL_COMMUNITY)
Admission: EM | Admit: 2017-11-12 | Discharge: 2017-11-12 | Disposition: A | Discharge status: Home / Self Care | Payer: Medicare HMO | Attending: Emergency Medicine | Admitting: Emergency Medicine

## 2017-11-12 DIAGNOSIS — Z5321 Procedure and treatment not carried out due to patient leaving prior to being seen by health care provider: Secondary | ICD-10-CM | POA: Diagnosis not present

## 2017-11-12 DIAGNOSIS — R05 Cough: Secondary | ICD-10-CM | POA: Insufficient documentation

## 2017-11-12 DIAGNOSIS — R0602 Shortness of breath: Secondary | ICD-10-CM | POA: Diagnosis present

## 2017-11-12 NOTE — ED Triage Notes (Signed)
Pt reports shortness of breath off and on for several weeks. SOB worse at night. Has had a cough, no fevers/chest pain. She has been using her inhalers and nebs at home. Lungs clear bilaterally.

## 2017-11-14 NOTE — ED Notes (Signed)
Follow up call made  Pt states feeling better  Will return to ed if needed  11/14/17  1217  s Yaden Seith rn

## 2017-11-16 NOTE — Progress Notes (Deleted)
Follow-up Note: Gyn-Onc  Consult was initially requested by Dr. Corinna Capra for the evaluation of GWENDA HEINER 57 y.o. 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  CC:  No chief complaint on file.   Assessment/Plan:  Ms. KYNDLE SCHLENDER  is a 57 y.o.  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 Adjuvant chemotherapy from 03/26/15-05/29/15.  Complete clinical response of post-treatment imaging from 07/04/15.  She completed less than 3 full cycles of adjuvant chemotherapy with carboplatin and paclitaxel, due to extreme bone marrow toxicity.   She has *** no evidence of recurrence on exam.  She will see me in 6 months for surveillance and continue this until February, 2022.  HPI: Tyja Gortney is a 57 year old woman who was seen in consultation at the request of Dr Corinna Capra for clear cell ovarian cancer.  The patient has a history of abnormal uterine bleeding/symptomatic fibroids. An US of the pelvis in September 2016 showed a 7cm complex cystic mass seen in the left ovary. A repeat US in October 2016 showed tha the mass had increased to approximately 10cm and was accompanied with normal blood flow and a normal CA 125 (10U/mL on 12/18/14).  She then underwent an ex lap (via Pfannenstiel) TAH, BSO and partial omentectomy on 02/11/15 with Dr Corinna Capra at [facility] in [place]. At the time of surgery, the large cystic mass was appreciated to be densely adherent to the pelvic sidewalls and in the process of removing it, unavoidable rupture occurred. Of note, pelvic washings taken from before the cyst rupture were negative for malignancy. According to the operative note there was complete resection of the cyst with no residual tumor adhesions or tissues in the pelvis and none identified in the peritoneal cavity. The omentum was grossly normal, and at the time of frozen section revealing malignancy, a sample from the  infracolic omentum was taken and was noted to be negative for malignancy.  Final pathology from the surgery revealed clear cell carcinoma of the left ovary. The uterus and contralateral tube and ovary were benign. The omentum was negative for malignancy.  She has done well postoperatively with no major morbidities.  The patient has major medical conditions of morbid obesity (BMI 54 kg/m2), crohn's disease, HTN, osteoporosis.  She was first seen by me on 02/24/2015 and at that consultation we recommended 6 cycles of adjuvant carboplatin and paclitaxel chemotherapy after a pretreatment baseline CT scan.  CT scan of the chest abdomen and pelvis on 03/07/2015 showed node apparent metastatic disease however it did demonstrate some signs of postoperative changes and fluid collections. She went on to commence adjuvant chemotherapy with Dr. Marko Plume with day 1 of cycle 1 dose dense, but the paclitaxel on 03/26/2015. She had multiple dose delays and reductions through the course of her treatment due to neutropenia thrombocytopenia and anemia. She required Granix bone marrow stimulation. Her final chemotherapy dose was day 1 of cycle 3 given on every 16th 2017. As the patient was poorly tolerating treatment the patient and her oncologist myometrial decision to discontinue treatment at that point in time.  A post therapeutic CT of the abdomen and pelvis on 07/04/2015 showed no apparent residual or recurrent GYN malignancy. Lab evaluations on 07/22/2015 showed normalization of her white blood cell count 4.4, hemoglobin was 9.8, and platelet count was 279.  CA 125 on 10/17/15 was normal at 8 and normal at 9.3 on 06/23/16. CT abdo/pelvis on  07/05/16 ordered for persistent left sided abdominal pain showed a "tiny" umbilical hernia but no evidence of recurrence and no apparent explanation for her left sided abdominal pain. CA 125 on 9.2 on 02/16/17.  Interval Hx:  CA 125 on 05/18/17 was 8.8. Port was removed on ***.  She  underwent abdominal hernia repair in April, 2019. She began experiencing abdominal pain in June, 2019.  On *** she underwent CT scan of the abdomen and pelvis. It showed ***.   Current Meds:  Outpatient Encounter Medications as of 11/17/2017  Medication Sig  . albuterol (PROVENTIL HFA;VENTOLIN HFA) 108 (90 Base) MCG/ACT inhaler Inhale 2 puffs into the lungs every 4 (four) hours as needed for wheezing or shortness of breath.  . alprazolam (XANAX) 2 MG tablet Take 2 mg by mouth 2 (two) times daily. 1/2 tab AM and 1 tab PM  . amLODipine (NORVASC) 2.5 MG tablet Take 2.5 mg by mouth.  . Ascorbic Acid (VITAMIN C) 1000 MG tablet Take 1,000 mg by mouth daily.  . budesonide-formoterol (SYMBICORT) 160-4.5 MCG/ACT inhaler Inhale 2 puffs into the lungs 2 (two) times daily.  . Cholecalciferol (VITAMIN D3) 5000 UNITS TABS Take 1 tablet by mouth daily. Reported on 06/26/2015  . hydroxypropyl methylcellulose / hypromellose (ISOPTO TEARS / GONIOVISC) 2.5 % ophthalmic solution Place 2 drops into both eyes as needed for dry eyes. Reported on 08/25/2015  . ibuprofen (ADVIL,MOTRIN) 600 MG tablet Take 1 tablet (600 mg total) by mouth daily as needed (mild pain).  Marland Kitchen ketoconazole (NIZORAL) 2 % cream Apply 1 application topically daily as needed for irritation. Reported on 05/08/2015  . Lactobacillus (ACIDOPHILUS PO) Take 2 capsules by mouth daily.   Marland Kitchen levocetirizine (XYZAL) 5 MG tablet Take 5 mg by mouth every morning.   . lidocaine-prilocaine (EMLA) cream Apply to Porta-cath site 1-2 hours prior to access as directed.  . methocarbamol (ROBAXIN) 500 MG tablet TK 1 T PO Q 8 H PRF MUSCLE PAIN / RELAXATION  . montelukast (SINGULAIR) 10 MG tablet Take 10 mg by mouth at bedtime. Reported on 10/20/2015  . nystatin (MYCOSTATIN/NYSTOP) 100000 UNIT/GM POWD Apply powder to groin twice a day.  . Omega-3 Fatty Acids (FISH OIL) 1000 MG CAPS Take 2 capsules by mouth daily. Reported on 06/26/2015  . promethazine (PHENERGAN) 25 MG tablet  Take 25 mg by mouth every 6 (six) hours as needed for nausea. Reported on 10/20/2015  . ranitidine (ZANTAC) 150 MG tablet Take 150 mg by mouth 2 (two) times daily. Reported on 05/08/2015  . sertraline (ZOLOFT) 50 MG tablet Take 50 mg by mouth daily.  . SYMBICORT 160-4.5 MCG/ACT inhaler INHALE 2 PUFFS BY MOUTH TWICE DAILY  . triamcinolone cream (KENALOG) 0.1 % Apply 1 application topically 2 (two) times daily as needed. Reported on 08/25/2015  . triamterene-hydrochlorothiazide (MAXZIDE) 75-50 MG per tablet Take 1 tablet by mouth daily. Reported on 06/12/2015  . vitamin E 400 UNIT capsule Take 400 Units by mouth daily. Reported on 06/26/2015   No facility-administered encounter medications on file as of 11/17/2017.     Allergy:  Allergies  Allergen Reactions  . Levofloxacin Palpitations    REACTION: tachycardia  . Moxifloxacin Palpitations    REACTION: tachycardia but tolerated cirpofloxacin just fine  . Stadol [Butorphanol] Other (See Comments)    Caused shakes for 6 weeks "shaking for weeks"  . Amoxicillin Rash    Can take 500 mg - amounts greater causes yeast infection REACTION: Rash with high doses. Can take lower doses like 530m  .  Asa [Aspirin] Other (See Comments)    Pt stated the 81 mg caused Exacerbation of her asthma  - unsure about other asprins Asthma exacerbation  . Ciprofloxacin Hcl Nausea And Vomiting    Can take lower doses like 569m. Other causes yeast infection Can take lower doses like 5032m  . Codeine Nausea Only and Nausea And Vomiting    REACTION: nausea  . Effexor [Venlafaxine] Other (See Comments)    hyperactivity Panic attacks  . Erythromycin Diarrhea and Nausea And Vomiting  . Latex Rash  . Losartan Potassium Nausea Only and Other (See Comments)    headache  . Paroxetine Other (See Comments)    Made nerves worse and caused fatigue  . Rocephin [Ceftriaxone] Diarrhea    diarrhea  . Butorphanol Tartrate     Gave her the shakes for 6 weeks  . Cephalosporins      REACTION: she has tolerated rocephin and Keflex without difficulty  . Clindamycin/Lincomycin Other (See Comments)    Tears stomach up.  . Influenza Vaccines     Pt states that she got really sick from flu vaccine was sick a total of 6 weeks  . Losartan Other (See Comments)    Headache, nausea, fatigue  . Paxil [Paroxetine Hcl] Other (See Comments)    fatigue  . Clarithromycin Diarrhea  . Clindamycin Diarrhea  . Tape Rash    Social Hx:   Social History   Socioeconomic History  . Marital status: Married    Spouse name: Not on file  . Number of children: 0  . Years of education: Not on file  . Highest education level: Not on file  Occupational History  . Occupation: disabled  Social Needs  . Financial resource strain: Not on file  . Food insecurity:    Worry: Not on file    Inability: Not on file  . Transportation needs:    Medical: Not on file    Non-medical: Not on file  Tobacco Use  . Smoking status: Never Smoker  . Smokeless tobacco: Never Used  Substance and Sexual Activity  . Alcohol use: No  . Drug use: No  . Sexual activity: Not on file  Lifestyle  . Physical activity:    Days per week: Not on file    Minutes per session: Not on file  . Stress: Not on file  Relationships  . Social connections:    Talks on phone: Not on file    Gets together: Not on file    Attends religious service: Not on file    Active member of club or organization: Not on file    Attends meetings of clubs or organizations: Not on file    Relationship status: Not on file  . Intimate partner violence:    Fear of current or ex partner: Not on file    Emotionally abused: Not on file    Physically abused: Not on file    Forced sexual activity: Not on file  Other Topics Concern  . Not on file  Social History Narrative  . Not on file    Past Surgical Hx:  Past Surgical History:  Procedure Laterality Date  . ABDOMINAL HYSTERECTOMY Bilateral 02/11/2015   Procedure: HYSTERECTOMY  ABDOMINAL, bilateral salpingo-oophorectomy;  Surgeon: DaLouretta ShortenMD;  [facility];  Service: Gynecology;  Laterality: Bilateral;  . DILATATION & CURETTAGE/HYSTEROSCOPY WITH TRUECLEAR N/A 08/17/2013   Procedure: DILATATION & CURETTAGE/HYSTEROSCOPY WITH TRUCLEAR;  Surgeon: DaLuz LexMD;  [facility];  Service: Gynecology;  Laterality: N/A;  . DILATION AND CURETTAGE OF UTERUS    . IR REMOVAL TUN ACCESS W/ PORT W/O FL MOD SED  07/05/2017  . MELANOMA EXCISION  06/2001   right leg; also removed 2 lymph nodes  . OMENTECTOMY N/A 02/11/2015   Procedure: OMENTECTOMY;  Surgeon: Louretta Shorten, MD;  Location: [facility];  Service: Gynecology;  Laterality: N/A;    Past Medical Hx:  Past Medical History:  Diagnosis Date  . Anemia due to antineoplastic chemotherapy 04/16/2015  . Anxiety   . Anxiety state 06/01/2007   Qualifier: Diagnosis of  By: Ronnald Ramp RN, [place], [place]    . Arthritis   . ARTHRITIS 06/01/2007   Qualifier: Diagnosis of  By: Ronnald Ramp RN, CGRN, Sheri    . Asthma   . Asthma exacerbation attacks 03/23/2015  . Candidiasis of vulva and vagina 04/10/2009   Qualifier: Diagnosis of  By: Tommy Medal MD, Roderic Scarce    . CELLULITIS AND ABSCESS OF LEG EXCEPT FOOT 03/24/2009   Qualifier: Diagnosis of  By: Tommy Medal MD, Roderic Scarce    . CONTACT DERMATITIS&OTHER ECZEMA DUE [place] CAUSE 04/10/2009   Qualifier: Diagnosis of  By: Tommy Medal MD, Roderic Scarce    . Crohn disease ([place])   . Crohn's disease without complication ([place]) 07/01/2246  . CROHN'S DISEASE, SMALL INTESTINE 06/01/2007   Qualifier: Diagnosis of  By: Ronnald Ramp RN, CGRN, Sheri    . Edema 03/24/2009   Qualifier: Diagnosis of  By: Tommy Medal MD, Roderic Scarce    . Folliculitis 05/18/35  . GASTROESOPHAGEAL REFLUX DISEASE 06/01/2007   Qualifier: Diagnosis of  By: Ronnald Ramp RN, CGRN, Sheri    . 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  The Custom gene panel offered by [facility] includes sequencing and rearrangement analysis for the  following 24 genes:  ATM, BAP1, BARD1, BRCA1, BRCA2, BRIP1, CDH1, CDK4, CDKN2A, CHEK2, EPCAM, FANCC, MITF, MLH1, MSH2, MSH6, NBN, PALB2, PMS2, PTEN, RAD51C, RAD51D, TP53, and XRCC2.   The report date is February 13, 2016.  Marland Kitchen GERD (gastroesophageal reflux disease)   . History of melanoma excision 03/23/2015  . Hypertension   . Hypokalemia 04/25/2015  . Insomnia   . Leukopenia due to antineoplastic chemotherapy ([place]) 04/16/2015  . MALIGNANT MELANOMA, HX OF 06/01/2007   Qualifier: History of  By: Ronnald Ramp RN, CGRN, Sheri    . Melanoma ([place])    rt. dorsal leg  . MORBID OBESITY 06/01/2007   Qualifier: Diagnosis of  By: Ronnald Ramp RN, CGRN, Sheri    . Morbid obesity with BMI of 50.0-59.9, adult ([place]) 03/23/2015  . OPEN WOUND OF KNEE LEG AND ANKLE COMPLICATED 04/88/8916   Qualifier: Diagnosis of  By: Patsy Baltimore RN, Langley Gauss    . Pyoderma gangrenosum   . RECTAL BLEEDING 10/15/2008   Qualifier: Diagnosis of  By: Olevia Perches MD, Lowella Bandy   . Shortness of breath   . Skin cancer 2003   melanoma  . Umbilical hernia without obstruction and without gangrene 03/23/2015  . Urinary, incontinence, stress female 10/27/2016    Past Gynecological History:  See HPI  No LMP recorded (lmp unknown). Patient has had a hysterectomy.  Family Hx:  Family History  Problem Relation Age of Onset  . Colon polyps Father   . Diabetes Father   . Heart disease Paternal Uncle   . Asthma Mother   . Melanoma Maternal Aunt   . Diabetes Paternal Grandmother   . Alcohol abuse Paternal Grandfather   . Breast cancer Cousin  maternal first cousin  . Colon cancer Neg Hx     Review of Systems:  Constitutional  Feels well,    ENT Normal appearing ears and nares bilaterally Skin/Breast  No rash, sores, jaundice, itching, dryness Cardiovascular  + SOB and edema Pulmonary  +cough  Gastro Intestinal  No nausea, vomitting, or diarrhoea. No bright red blood per rectum, no abdominal pain, change in bowel movement, or constipation.   Genito Urinary  No frequency, urgency, dysuria, see HPI Musculo Skeletal  No myalgia, arthralgia, joint swelling or pain  Neurologic  No weakness, numbness, change in gait,  Psychology  No depression, anxiety, insomnia.   Vitals:  There were no vitals taken for this visit.  Physical Exam: WD in NAD Neck  Supple NROM, without any enlargements.  Lymph Node Survey No cervical supraclavicular or inguinal adenopathy Cardiovascular  Pulse normal rate, regularity and rhythm. S1 and S2 normal.  Lungs  Clear to auscultation bilateraly, without wheezes/crackles/rhonchi. Good air movement.  Skin  No rash/lesions/breakdown  Psychiatry  Alert and oriented to person, place, and time  Abdomen  Normoactive bowel sounds, abdomen soft, non-tender and obese. Well healed low transverse incision.  Soft, reducible umbilical hernia. Back No CVA tenderness Genito Urinary  Vulva/vagina: Normal external female genitalia.  No lesions. No discharge or bleeding.  Bladder/urethra:  No lesions or masses, well supported bladder  Vagina: grossly normal with in tact cuff.  Cervix: surgically absent  Uterus: surgically absent    Adnexa: no palpable masses. Rectal  Good tone, no masses no cul de sac nodularity.  Extremities  No bilateral cyanosis, clubbing or edema.  ***20 minutes of face to face counseling time was spent with the patient.  Thereasa Solo, MD  11/16/2017, 2:03 PM

## 2017-11-17 ENCOUNTER — Telehealth: Payer: Self-pay | Admitting: *Deleted

## 2017-11-17 ENCOUNTER — Ambulatory Visit: Payer: Medicare HMO | Admitting: Gynecologic Oncology

## 2017-11-17 ENCOUNTER — Ambulatory Visit (HOSPITAL_COMMUNITY): Admission: RE | Admit: 2017-11-17 | Payer: Medicare HMO | Source: Ambulatory Visit

## 2017-11-17 NOTE — Telephone Encounter (Signed)
Patient called and left a message to cancel her appts for today. Patient stated "I'm sick and having diarrhea. I will call back to reschedule."

## 2017-11-30 ENCOUNTER — Telehealth: Payer: Self-pay | Admitting: *Deleted

## 2017-11-30 NOTE — Telephone Encounter (Signed)
Called and left the patient a message to the office back. Patient called the after hours line last night, following up on call.

## 2017-12-01 NOTE — Telephone Encounter (Signed)
Told [name] that if she is experiencing CP and feeing badly that she should go to the ED to be evaluated while she has the symptoms.  She should also follow up with her pcp if she is continues with feeling poorly. Pt stated that she has set up an appointment but could not give a date and time.

## 2017-12-29 ENCOUNTER — Telehealth: Payer: Self-pay

## 2017-12-29 NOTE — Telephone Encounter (Signed)
Pt calling to r/s her CT scan and visit with Dr. Everitt Amber. Pt seen on 12-16-17 in [place].  Pt had Chemistries done that day.  Will use these values for CT A/P if scheduled with in 6 weeks from 12-16-17.

## 2017-12-29 NOTE — Telephone Encounter (Deleted)
12/16/2017 12/02/2017 11/19/2017 11/19/2017 11/14/2017 11/09/2017 10/23/2017 10/23/2017 10/20/2017 10/20/2017 10/19/2017 10/19/2017 10/18/2017 10/17/2017 10/17/2017 10/17/2017 10/16/2017 10/16/2017 10/16/2017 10/15/2017 10/15/2017 10/14/2017 10/13/2017 10/11/2017 10/08/2017 10/08/2017 10/06/2017 07/27/2017 12/09/2014 03/14/2013   0.82 1.06 (H) 0.78  0.78 0.80 0.70  0.60   0.60 0.70  0.80   0.80  0.77  0.78 0.75 0.84 0.87  1.03 (H) 0.91 1.12 (H) 0.86                              >60 >60                              51 >60  141 135 (L) 140  142 138 137  138   139 137  139   137  139  139 139 141 141  139 140    3.6 (L) 3.4 (L) 3.0 (L)  3.3 (L) 3.2 (L) 3.5 (L)  3.4 (L)   3.9 3.7  3.9   3.2 (L)  3.7  3.9 3.5 (L) 3.4 (L) 3.3 (L)  3.3 (L) 3.4 (L)    105 98 102  103 98 95 (L)  96 (L)   101 103  105   99  100  103 103 104 102  100 102    23 19 (L) 26  25 26 30   32   23 25  27   26  26  25 26 24 26  24 28     95 119 (H) 120 (H)  111 (H) 101 (H) 104 (H)  91   89 99  105 (H)   149 (H)  109 (H)  89 104 (H) 129 (H) 101 (H)  108 (H) 100 (H)    15 15 11  14 13 20  19   19 19  19   18  13  11 14 19 16  17 13     9.1 10.1 8.7  9.1 9.7 9.1  8.9   8.2 (L) 8.5 (L)  8.4 (L)   8.5 (L)  9.1  8.9 9.3 9.5 9.9  9.9 9.2    18.3 14.2 14.1  17.9 16.3 28.6 (H)  31.7 (H)   31.7 (H) 27.1 (H)  23.8   22.5  16.9  14.1 18.7 22.6 18.4  16.5 14.3    92 67 98  98 95 111  117   117 111  95   95  99  98 102 89 86  70 81    80 58 85  85 82 96   102   102 96  82   82  86  85 89 77 74  60 70    13 18 (H) 12  14 14 12  10   15 9  7   12  13  11 10 13 13  15 10        1.7    1.9  2.0      1.9   1.9       2.0                2.6 (HH)   1.8   1.2    0.9           69 73 71  89 79              70  66     67  0.47 0.87 0.31   0.56 0.78              0.69  0.54     0.72     8.0 9.1 (H) 7.3  8.6 (H) 8.3              7.1  7.4     9.0 (H)     4.0 4.2 3.7  4.0 3.8              3.2 (L)  3.4 (L)     4.2     4.0 4.9 (H) 3.6  4.6 (H) 4.5              3.9  4.0     4.8 (H)     1.0 (L) 0.9 (L) 1.0 (L)  0.9 (L) 0.8 (L)              0.8 (L)  0.9 (L)     0.9 (L)     26 37 27  39 41 (H)              20  18     19      26

## 2017-12-29 NOTE — Telephone Encounter (Deleted)
[facility] All Lab Results from [facility] Lab Results from [facility] All  CBCLatest Result: 12/16/2017  CBC Component Name 12/16/2017 12/02/2017 11/19/2017 11/14/2017 11/09/2017 10/19/2017 10/18/2017 10/17/2017 10/16/2017 10/15/2017 10/14/2017 10/11/2017 10/06/2017 07/27/2017   4.8 4.4 4.9 5.1 3.6 (L) 8.9 9.3 7.9 8.6 8.5 6.4 5.5 4.9 6.8  4.16 4.72 4.10 4.57 4.19 3.72 (L) 3.90 (L) 3.60 (L) 3.86 (L) 4.22 3.65 (L) 4.29 4.24 4.11  11.8 (L) 13.1 11.4 (L) 12.6 11.4 (L) 10.3 (L) 10.6 (L) 9.9 (L) 10.7 (L) 11.8 (L) 10.5 (L) 11.9 (L) 11.7 (L) 11.7 (L)  36.7 41.2 37.1 40.3 37.1 32.8 (L) 35.5 (L) 32.6 (L) 35.1 (L) 37.6 32.9 (L) 38.3 37.3 37.5  88 87 91 88 89 88 91 91 91 89  90 89 88 91  28.4 27.8 27.8 27.6 27.2 27.7 27.2 27.5 27.7 28.0 28.8 27.7 27.6 28.5  32.2 31.8 30.7 (L) 31.3 30.7 (L) 31.4 29.9 (L) 30.4 (L) 30.5 (L) 31.4 31.9 31.1 31.4 31.2  255 355 221 224 282 201 192 163 157 173  187 228 226 294  45.8 46.5 49.0 (H) 46.7 48.4 (H) 47.2 (H) 48.7 (H) 47.9 (H) 48.1 (H) 46.1 46.7 45.4 45.1 45.0  9.8 10.1 10.1 10.1 10.2 11.8 (H) 11.4 (H) 11.7 (H) 11.0 11.1 10.9 10.3 10.5 10.4  WBC  RBC  HGB  HCT  MCV  MCH  MCHC  Plt Ct  RDW SD  MPV   CHEM PROFILE Component Name 12/16/2017 12/02/2017 11/19/2017 11/19/2017 11/14/2017 11/09/2017 10/23/2017 10/23/2017 10/20/2017 10/20/2017 10/19/2017 10/19/2017 10/18/2017 10/17/2017 10/17/2017 10/17/2017 10/16/2017 10/16/2017 10/16/2017 10/15/2017 10/15/2017 10/14/2017 10/13/2017 10/11/2017 10/08/2017 10/08/2017 10/06/2017 07/27/2017 12/09/2014 03/14/2013   0.82 1.06 (H) 0.78  0.78 0.80 0.70  0.60   0.60 0.70  0.80   0.80  0.77  0.78 0.75 0.84 0.87  1.03 (H) 0.91 1.12 (H) 0.86                              >60 >60                              51 >60  141 135 (L) 140  142 138 137  138   139 137  139   137  139  139 139 141 141  139 140    3.6 (L) 3.4 (L) 3.0 (L)  3.3 (L) 3.2 (L) 3.5 (L)  3.4 (L)    3.9 3.7  3.9   3.2 (L)  3.7  3.9 3.5 (L) 3.4 (L) 3.3 (L)  3.3 (L) 3.4 (L)    105 98 102  103 98 95 (L)  96 (L)   101 103  105   99  100  103 103 104 102  100 102    23 19 (L) 26  25 26 30   32   23 25  27   26  26  25 26 24 26  24 28     95 119 (H) 120 (H)  111 (H) 101 (H) 104 (H)  91   89 99  105 (H)   149 (H)  109 (H)  89 104 (H) 129 (H) 101 (H)  108 (H) 100 (H)    15 15 11  14 13 20  19   19 19  19   18  13  11 14 19 16  17  13  9.1 10.1 8.7  9.1 9.7 9.1  8.9   8.2 (L) 8.5 (L)  8.4 (L)   8.5 (L)  9.1  8.9 9.3 9.5 9.9  9.9 9.2    18.3 14.2 14.1  17.9 16.3 28.6 (H)  31.7 (H)   31.7 (H) 27.1 (H)  23.8   22.5  16.9  14.1 18.7 22.6 18.4  16.5 14.3    92 67 98  98 95 111  117   117 111  95   95  99  98 102 89 86  70 81    80 58 85  85 82 96   102   102 96  82   82  86  85 89 77 74  60 70    13 18 (H) 12  14 14 12  10   15 9  7   12  13  11 10 13 13  15 10        1.7    1.9  2.0      1.9   1.9       2.0                2.6 (HH)   1.8   1.2    0.9           69 73 71  89 79              70  66     67     0.47 0.87 0.31  0.56 0.78              0.69  0.54     0.72     8.0 9.1 (H) 7.3  8.6 (H) 8.3              7.1  7.4     9.0 (H)     4.0 4.2 3.7  4.0 3.8              3.2 (L)  3.4 (L)     4.2     4.0 4.9 (H) 3.6  4.6 (H) 4.5              3.9  4.0     4.8 (H)     1.0 (L) 0.9 (L) 1.0 (L)  0.9 (L) 0.8 (L)              0.8 (L)  0.9 (L)     0.9 (L)     26 37 27  39 41 (H)              20  18     19     26  35 29  36 42 (H)              19  19     24      Creatinine  GFR-African American  GFR Non-African American  Na  Potassium  Cl  CO2  Glucose  BUN  Ca  BUN/CREAT RATIO    GFR AFRICAN AMERICAN  GFR Non African American  AGAP  Mg  Lactic Acid  ALK PHOS  T Bili  Total Protein  Alb  GLOBULIN  ALBUMIN/GLOBULIN RATIO  ALT

## 2018-01-05 ENCOUNTER — Telehealth: Payer: Self-pay | Admitting: *Deleted

## 2018-01-05 NOTE — Telephone Encounter (Signed)
Called and left messages on 9/20 and 9/23 for the patient to call the office back to schedule CT scan. Called today and left another message to call the office

## 2018-01-06 ENCOUNTER — Telehealth: Payer: Self-pay | Admitting: *Deleted

## 2018-01-06 NOTE — Telephone Encounter (Signed)
Attempted to return the patient's call. No answer

## 2018-03-18 ENCOUNTER — Other Ambulatory Visit: Payer: Self-pay | Admitting: Nurse Practitioner

## 2018-04-11 ENCOUNTER — Other Ambulatory Visit: Payer: Self-pay | Admitting: Gynecologic Oncology

## 2018-04-11 ENCOUNTER — Other Ambulatory Visit: Payer: Self-pay | Admitting: Family Medicine

## 2018-04-11 DIAGNOSIS — Z1231 Encounter for screening mammogram for malignant neoplasm of breast: Secondary | ICD-10-CM

## 2018-04-14 ENCOUNTER — Telehealth: Payer: Self-pay

## 2018-04-14 NOTE — Telephone Encounter (Signed)
SENT REFERRAL TO SCHEDULING AND FILED NOTES 

## 2018-04-18 ENCOUNTER — Ambulatory Visit
Admission: RE | Admit: 2018-04-18 | Discharge: 2018-04-18 | Disposition: A | Discharge status: Home / Self Care | Payer: Medicare HMO | Source: Ambulatory Visit | Attending: Gynecologic Oncology | Admitting: Gynecologic Oncology

## 2018-04-18 DIAGNOSIS — Z1231 Encounter for screening mammogram for malignant neoplasm of breast: Secondary | ICD-10-CM

## 2018-04-26 ENCOUNTER — Telehealth: Payer: Self-pay | Admitting: *Deleted

## 2018-04-26 NOTE — Telephone Encounter (Signed)
Returned the patient's call and scheduled appts for her. Schedule the patient for a CT scan on 2/4 at 12:30pm, arrive at 10:30am with the water based contrast. Patient scheduled for a lab appt at 9:45am on 2/4 and follow up with Dr. Denman George at 2:45pm on 2/4. Patient will go for lunch after her scan and then come to the appt with Dr. Denman George

## 2018-05-02 NOTE — Progress Notes (Deleted)
Cardiology Office Note   Date:  05/02/2018   ID:  [name], DOB October 04, 1960, MRN [id number]  PCP:  Charline Bills, MD  Cardiologist:   Jenkins Rouge, MD   No chief complaint on file.     History of Present Illness: [name] is a 58 y.o. female who presents for consultation regarding chronic diastolic CHF and LE edema. Referred By Dr Amedeo Plenty. 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 TTE done 06/13/17 showed EF 53-66% grade 2 diastolic dysfunction mild RVE trivial TR No estimate of PA pressure given  ***    Past Medical History:  Diagnosis Date  . Anemia due to antineoplastic chemotherapy 04/16/2015  . Anxiety   . Anxiety state 06/01/2007   Qualifier: Diagnosis of  By: Ronnald Ramp RN, [place], [place]    . Arthritis   . ARTHRITIS 06/01/2007   Qualifier: Diagnosis of  By: Ronnald Ramp RN, CGRN, Sheri    . Asthma   . Asthma exacerbation attacks 03/23/2015  . Candidiasis of vulva and vagina 04/10/2009   Qualifier: Diagnosis of  By: Tommy Medal MD, Roderic Scarce    . CELLULITIS AND ABSCESS OF LEG EXCEPT FOOT 03/24/2009   Qualifier: Diagnosis of  By: Tommy Medal MD, Roderic Scarce    . CONTACT DERMATITIS&OTHER ECZEMA DUE [place] CAUSE 04/10/2009   Qualifier: Diagnosis of  By: Tommy Medal MD, Roderic Scarce    . Crohn disease ([place])   . Crohn's disease without complication ([place]) 44/06/4740  . CROHN'S DISEASE, SMALL INTESTINE 06/01/2007   Qualifier: Diagnosis of  By: Ronnald Ramp RN, CGRN, She[place]    . Edema 03/24/2009   Qualifier: Diagnosis of  By: Tommy Medal MD, Roderic Scarce    . Folliculitis 08/18/5636  . GASTROESOPHAGEAL REFLUX DISEASE 06/01/2007   Qualifier: Diagnosis of  By: Ronnald Ramp RN, CGRN, Sheri    . 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  The Custom gene panel offered by [facility] includes sequencing and rearrangement analysis for the following 24 genes:  ATM, BAP1, BARD1, BRCA1, BRCA2,  BRIP1, CDH1, CDK4, CDKN2A, CHEK2, EPCAM, FANCC, MITF, MLH1, MSH2, MSH6, NBN, PALB2, PMS2, PTEN, RAD51C, RAD51D, TP53, and XRCC2.   The report date is February 13, 2016.  Marland Kitchen GERD (gastroesophageal reflux disease)   . History of melanoma excision 03/23/2015  . Hypertension   . Hypokalemia 04/25/2015  . Insomnia   . Leukopenia due to antineoplastic chemotherapy ([place]) 04/16/2015  . MALIGNANT MELANOMA, HX OF 06/01/2007   Qualifier: History of  By: Ronnald Ramp RN, CGRN, Sheri    . Melanoma ([place])    rt. dorsal leg  . MORBID OBESITY 06/01/2007   Qualifier: Diagnosis of  By: Ronnald Ramp RN, CGRN, Sheri    . Morbid obesity with BMI of 50.0-59.9, adult ([place]) 03/23/2015  . OPEN WOUND OF KNEE LEG AND ANKLE COMPLICATED 75/64/3329   Qualifier: Diagnosis of  By: Patsy Baltimore RN, Langley Gauss    . Pyoderma gangrenosum   . RECTAL BLEEDING 10/15/2008   Qualifier: Diagnosis of  By: Olevia Perches MD, Lowella Bandy   . Shortness of breath   . Skin cancer 2003   melanoma  . Umbilical hernia without obstruction and without gangrene 03/23/2015  . Urinary, incontinence, stress female 10/27/2016    Past Surgical History:  Procedure Laterality Date  . ABDOMINAL HYSTERECTOMY Bilateral 02/11/2015   Procedure: HYSTERECTOMY ABDOMINAL, bilateral salpingo-oophorectomy;  Surgeon: Louretta Shorten, MD;  Location: [facility];  Service: Gynecology;  Laterality: Bilateral;  . DILATATION & CURETTAGE/HYSTEROSCOPY WITH TRUECLEAR N/A 08/17/2013   Procedure: DILATATION & CURETTAGE/HYSTEROSCOPY WITH TRUCLEAR;  Surgeon: Luz Lex, MD;  Location: [facility];  Service: Gynecology;  Laterality: N/A;  . DILATION AND CURETTAGE OF UTERUS    . IR REMOVAL TUN ACCESS W/ PORT W/O FL MOD SED  07/05/2017  . MELANOMA EXCISION  06/2001   right leg; also removed 2 lymph nodes  . OMENTECTOMY N/A 02/11/2015   Procedure: OMENTECTOMY;  Surgeon: Louretta Shorten, MD;  Location: [facility];  Service: Gynecology;  Laterality: N/A;     Current Outpatient Medications  Medication Sig Dispense Refill  . albuterol  (PROVENTIL HFA;VENTOLIN HFA) 108 (90 Base) MCG/ACT inhaler Inhale 2 puffs into the lungs every 4 (four) hours as needed for wheezing or shortness of breath. 1 Inhaler 2  . alprazolam (XANAX) 2 MG tablet Take 2 mg by mouth 2 (two) times daily. 1/2 tab AM and 1 tab PM    . amLODipine (NORVASC) 2.5 MG tablet Take 2.5 mg by mouth.    . Ascorbic Acid (VITAMIN C) 1000 MG tablet Take 1,000 mg by mouth daily.    . budesonide-formoterol (SYMBICORT) 160-4.5 MCG/ACT inhaler Inhale 2 puffs into the lungs 2 (two) times daily. 1 Inhaler 3  . Cholecalciferol (VITAMIN D3) 5000 UNITS TABS Take 1 tablet by mouth daily. Reported on 06/26/2015    . hydroxypropyl methylcellulose / hypromellose (ISOPTO TEARS / GONIOVISC) 2.5 % ophthalmic solution Place 2 drops into both eyes as needed for dry eyes. Reported on 08/25/2015    . ibuprofen (ADVIL,MOTRIN) 600 MG tablet Take 1 tablet (600 mg total) by mouth daily as needed (mild pain). 20 tablet 0  . ketoconazole (NIZORAL) 2 % cream Apply 1 application topically daily as needed for irritation. Reported on 05/08/2015    . Lactobacillus (ACIDOPHILUS PO) Take 2 capsules by mouth daily.     Marland Kitchen levocetirizine (XYZAL) 5 MG tablet Take 5 mg by mouth every morning.     . lidocaine-prilocaine (EMLA) cream Apply to Porta-cath site 1-2 hours prior to access as directed. 30 g 1  . methocarbamol (ROBAXIN) 500 MG tablet TK 1 T PO Q 8 H PRF MUSCLE PAIN / RELAXATION  1  . montelukast (SINGULAIR) 10 MG tablet Take 10 mg by mouth at bedtime. Reported on 10/20/2015    . nystatin (MYCOSTATIN/NYSTOP) 100000 UNIT/GM POWD Apply powder to groin twice a day. 30 g 3  . Omega-3 Fatty Acids (FISH OIL) 1000 MG CAPS Take 2 capsules by mouth daily. Reported on 06/26/2015    . promethazine (PHENERGAN) 25 MG tablet Take 25 mg by mouth every 6 (six) hours as needed for nausea. Reported on 10/20/2015    . ranitidine (ZANTAC) 150 MG tablet Take 150 mg by mouth 2 (two) times daily. Reported on 05/08/2015    . sertraline  (ZOLOFT) 50 MG tablet Take 50 mg by mouth daily.    . SYMBICORT 160-4.5 MCG/ACT inhaler INHALE 2 PUFFS BY MOUTH TWICE DAILY 10.2 g 1  . triamcinolone cream (KENALOG) 0.1 % Apply 1 application topically 2 (two) times daily as needed. Reported on 08/25/2015    . triamterene-hydrochlorothiazide (MAXZIDE) 75-50 MG per tablet Take 1 tablet by mouth daily. Reported on 06/12/2015    . vitamin E 400 UNIT capsule Take 400 Units by mouth daily. Reported on 06/26/2015     No current facility-administered medications for this visit.     Allergies:   Levofloxacin;  Moxifloxacin; Stadol [butorphanol]; Amoxicillin; Asa [aspirin]; Ciprofloxacin hcl; Codeine; Effexor [venlafaxine]; Erythromycin; Latex; Losartan potassium; Paroxetine; Rocephin [ceftriaxone]; Butorphanol tartrate; Cephalosporins; Clindamycin/lincomycin; Influenza vaccines; Losartan; Paxil [paroxetine hcl]; Clarithromycin; Clindamycin; and Tape    Social History:  The patient  reports that she has never smoked. She has never used smokeless tobacco. She reports that she does not drink alcohol or use drugs.   Family History:  The patient's family history includes Alcohol abuse in her paternal grandfather; Asthma in her mother; Breast cancer in her cousin; Colon polyps in her father; Diabetes in her father and paternal grandmother; Heart disease in her paternal uncle; Melanoma in her maternal aunt.    ROS:  Please see the history of present illness.   Otherwise, review of systems are positive for none.   All other systems are reviewed and negative.    PHYSICAL EXAM: VS:  LMP  (LMP Unknown)  , BMI There is no height or weight on file to calculate BMI. Affect appropriate Obese female chronically ill  HEENT: normal Neck supple with no adenopathy JVP normal no bruits no thyromegaly post right chest wall port a cath removed  Lungs clear with no wheezing and good diaphragmatic motion Heart:  S1/S2 no murmur, no rub, gallop or click PMI normal Abdomen:  benighn, BS positve, no tenderness, no AAA no bruit.  No HSM or HJR Distal pulses intact with no bruits Plus 2 LE  edema Neuro non-focal Skin warm and dry No muscular weakness    EKG:  11/12/17 SR rate 89 poor R wave progression    Recent Labs: 07/05/2017: BUN 25; Creatinine, Ser 0.84; Hemoglobin 11.1; Platelets 243; Potassium 4.4; Sodium 139    Lipid Panel No results found for: CHOL, TRIG, HDL, CHOLHDL, VLDL, LDLCALC, LDLDIRECT    Wt Readings from Last 3 Encounters:  11/12/17 255 lb (115.7 kg)  07/05/17 264 lb (119.7 kg)  05/18/17 275 lb 9.6 oz (125 kg)      Other studies Reviewed: Additional studies/ records that were reviewed today include: notes from oncology, primary labs TTE.    ASSESSMENT AND PLAN:  1.  Diastolic dysfunction:  Related to obesity and HTN  Currently on Maxzide as diuretic *** 2. Edema: dependant form obesity and likely DVT in past with history of PE *** 3. HTN:  Well controlled.  Continue current medications and low sodium Dash type diet.   4. Ovarian CA :  Post resection and chemo CA 125 normal 05/18/17   Current medicines are reviewed at length with the patient today.  The patient does not have concerns regarding medicines.  The following changes have been made:  ***  Labs/ tests ordered today include: BNP and updated TTE  No orders of the defined types were placed in this encounter.    Disposition:   FU with cardiology PRN      Signed, Jenkins Rouge, MD  05/02/2018 12:31 PM    [address], [place], [place]  [address] Phone: [phone]; Fax: [phone]

## 2018-05-05 ENCOUNTER — Ambulatory Visit: Payer: Medicare HMO | Admitting: Cardiovascular Disease

## 2018-05-09 ENCOUNTER — Other Ambulatory Visit: Payer: Self-pay | Admitting: Family Medicine

## 2018-05-09 DIAGNOSIS — Z13820 Encounter for screening for osteoporosis: Secondary | ICD-10-CM

## 2018-05-10 ENCOUNTER — Ambulatory Visit
Admission: RE | Admit: 2018-05-10 | Discharge: 2018-05-10 | Disposition: A | Discharge status: Home / Self Care | Payer: Medicare HMO | Source: Ambulatory Visit | Attending: Family Medicine | Admitting: Family Medicine

## 2018-05-10 DIAGNOSIS — Z13820 Encounter for screening for osteoporosis: Secondary | ICD-10-CM

## 2018-05-16 ENCOUNTER — Encounter: Payer: Self-pay | Admitting: Gynecologic Oncology

## 2018-05-16 ENCOUNTER — Inpatient Hospital Stay (HOSPITAL_BASED_OUTPATIENT_CLINIC_OR_DEPARTMENT_OTHER): Payer: Medicare HMO | Admitting: Gynecologic Oncology

## 2018-05-16 ENCOUNTER — Ambulatory Visit (HOSPITAL_COMMUNITY): Payer: Medicare HMO

## 2018-05-16 ENCOUNTER — Encounter (HOSPITAL_COMMUNITY): Payer: Self-pay

## 2018-05-16 ENCOUNTER — Inpatient Hospital Stay: Payer: Medicare HMO | Attending: Gynecologic Oncology

## 2018-05-16 ENCOUNTER — Other Ambulatory Visit: Payer: Self-pay | Admitting: Gynecologic Oncology

## 2018-05-16 ENCOUNTER — Ambulatory Visit (HOSPITAL_COMMUNITY)
Admission: RE | Admit: 2018-05-16 | Discharge: 2018-05-16 | Disposition: A | Discharge status: Home / Self Care | Payer: Medicare HMO | Source: Ambulatory Visit | Attending: Gynecologic Oncology | Admitting: Gynecologic Oncology

## 2018-05-16 VITALS — BP 133/80 | HR 65 | Temp 98.5°F | Resp 18 | Ht 63.0 in | Wt 243.0 lb

## 2018-05-16 DIAGNOSIS — Z90722 Acquired absence of ovaries, bilateral: Secondary | ICD-10-CM | POA: Diagnosis not present

## 2018-05-16 DIAGNOSIS — K625 Hemorrhage of anus and rectum: Secondary | ICD-10-CM | POA: Insufficient documentation

## 2018-05-16 DIAGNOSIS — Z9221 Personal history of antineoplastic chemotherapy: Secondary | ICD-10-CM | POA: Diagnosis not present

## 2018-05-16 DIAGNOSIS — C562 Malignant neoplasm of left ovary: Secondary | ICD-10-CM | POA: Diagnosis not present

## 2018-05-16 DIAGNOSIS — C569 Malignant neoplasm of unspecified ovary: Secondary | ICD-10-CM | POA: Diagnosis present

## 2018-05-16 DIAGNOSIS — N393 Stress incontinence (female) (male): Secondary | ICD-10-CM | POA: Diagnosis not present

## 2018-05-16 DIAGNOSIS — R1030 Lower abdominal pain, unspecified: Secondary | ICD-10-CM | POA: Insufficient documentation

## 2018-05-16 DIAGNOSIS — Z90710 Acquired absence of both cervix and uterus: Secondary | ICD-10-CM | POA: Insufficient documentation

## 2018-05-16 LAB — BASIC METABOLIC PANEL
Anion gap: 6 (ref 5–15)
BUN: 19 mg/dL (ref 6–20)
CO2: 27 mmol/L (ref 22–32)
Calcium: 9.1 mg/dL (ref 8.9–10.3)
Chloride: 103 mmol/L (ref 98–111)
Creatinine, Ser: 0.75 mg/dL (ref 0.44–1.00)
GFR calc non Af Amer: >60 mL/min (ref >60)
Glucose, Bld: 83 mg/dL (ref 70–99)
Potassium: 3.7 mmol/L (ref 3.5–5.1)
Sodium: 136 mmol/L (ref 135–145)

## 2018-05-16 MED ORDER — IOHEXOL 300 MG/ML  SOLN
30.0000 mL | Freq: Once | INTRAMUSCULAR | Status: AC | PRN
  Administered 2018-05-16: 30 mL via ORAL

## 2018-05-16 MED ORDER — IOHEXOL 300 MG/ML  SOLN
100.0000 mL | Freq: Once | INTRAMUSCULAR | Status: AC | PRN
  Administered 2018-05-16: 100 mL via INTRAVENOUS

## 2018-05-16 MED ORDER — SODIUM CHLORIDE (PF) 0.9 % IJ SOLN
INTRAMUSCULAR | Status: AC
  Filled 2018-05-16: qty 50

## 2018-05-16 NOTE — Patient Instructions (Signed)
Please notify Dr Denman George at phone number [phone] if you notice vaginal bleeding, new pelvic or abdominal pains, bloating, feeling full easy, or a change in bladder or bowel function.   Please contact Dr Serita Grit office (at [phone]) in April to request an appointment with her for August, 2020.  DrRossi's office will schedule you to see a gastroenterologist regarding your rectal bleeding and a urologist to see you for incontinence.

## 2018-05-16 NOTE — Progress Notes (Signed)
Follow-up Note: Gyn-Onc  Consult was initially requested by Dr. Corinna Capra for the evaluation of [name] 58 y.o. 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  CC:  Chief Complaint  Patient presents with  . Ovarian CA, left [facility])    Assessment/Plan:  [name]  is a 58 y.o.  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 Adjuvant chemotherapy from 03/26/15-05/29/15.  Complete clinical response of post-treatment imaging from 07/04/15.  She completed less than 3 full cycles of adjuvant chemotherapy with carboplatin and paclitaxel, due to extreme bone marrow toxicity.   She has no evidence of recurrence on exam. CA 125 pending, if normal, she will have her port removed.  Referrals for urology and GI consults for stress incontinence and rectal bleeding.   She will see me in 6 months and continue at this frequency until February, 2022.  HPI: [name] is a 59 year old woman who was seen in consultation at the request of Dr Corinna Capra for clear cell ovarian cancer.  The patient has a history of abnormal uterine bleeding/symptomatic fibroids. An US of the pelvis in September 2016 showed a 7cm complex cystic mass seen in the left ovary. A repeat US in October 2016 showed tha the mass had increased to approximately 10cm and was accompanied with normal blood flow and a normal CA 125 (10U/mL on 12/18/14).  She then underwent an ex lap (via Pfannenstiel) TAH, BSO and partial omentectomy on 02/11/15 with Dr Corinna Capra at [facility] in [place]. At the time of surgery, the large cystic mass was appreciated to be densely adherent to the pelvic sidewalls and in the process of removing it, unavoidable rupture occurred. Of note, pelvic washings taken from before the cyst rupture were negative for malignancy. According to the operative note there was complete resection of the cyst with no residual tumor adhesions or  tissues in the pelvis and none identified in the peritoneal cavity. The omentum was grossly normal, and at the time of frozen section revealing malignancy, a sample from the infracolic omentum was taken and was noted to be negative for malignancy.  Final pathology from the surgery revealed clear cell carcinoma of the left ovary. The uterus and contralateral tube and ovary were benign. The omentum was negative for malignancy.  She has done well postoperatively with no major morbidities.  The patient has major medical conditions of morbid obesity (BMI 54 kg/m2), crohn's disease, HTN, osteoporosis.  She was first seen by me on 02/24/2015 and at that consultation we recommended 6 cycles of adjuvant carboplatin and paclitaxel chemotherapy after a pretreatment baseline CT scan.  CT scan of the chest abdomen and pelvis on 03/07/2015 showed node apparent metastatic disease however it did demonstrate some signs of postoperative changes and fluid collections. She went on to commence adjuvant chemotherapy with Dr. Marko Plume with day 1 of cycle 1 dose dense, but the paclitaxel on 03/26/2015. She had multiple dose delays and reductions through the course of her treatment due to neutropenia thrombocytopenia and anemia. She required Granix bone marrow stimulation. Her final chemotherapy dose was day 1 of cycle 3 given on every 16th 2017. As the patient was poorly tolerating treatment the patient and her oncologist myometrial decision to discontinue treatment at that point in time.  A post therapeutic CT of the abdomen and pelvis on 07/04/2015 showed no apparent residual or recurrent GYN malignancy. Lab evaluations on 07/22/2015 showed normalization of  her white blood cell count 4.4, hemoglobin was 9.8, and platelet count was 279.  CA 125 on 10/17/15 was normal at 8 and normal at 9.3 on 06/23/16. CT abdo/pelvis on 07/05/16 ordered for persistent left sided abdominal pain showed a "tiny" umbilical hernia but no evidence of  recurrence and no apparent explanation for her left sided abdominal pain. CA 125 on 9.2 on 02/16/17. CA 125 was normal at 8.8 on 05/18/17  Interval Hx:  She has had bronchitis, pneumonia and is now being worked up for possible CHF.  She reported abdominal pains in 2019 and these were worked up with a CT scan of the abdomen and pelvis on 5 referral 2020 which revealed no evidence of ovarian carcinoma recurrence of metastases, stable size of pelvic lymph nodes unchanged from comparison CT from 2018.  Mild left colonic diverticulosis without diverticulitis.  She underwent hernia repair - uncomplicated.  She reports occasional BRB per rectum. She reports worsening stress urinary incontinence.   CA 125 from 05/16/18 pending.   Current Meds:  Outpatient Encounter Medications as of 05/16/2018  Medication Sig  . albuterol (PROVENTIL HFA;VENTOLIN HFA) 108 (90 Base) MCG/ACT inhaler Inhale 2 puffs into the lungs every 4 (four) hours as needed for wheezing or shortness of breath.  . alprazolam (XANAX) 2 MG tablet Take 2 mg by mouth 2 (two) times daily. 1/2 tab AM and 1 tab PM  . Ascorbic Acid (VITAMIN C) 1000 MG tablet Take 1,000 mg by mouth daily.  . Cholecalciferol (VITAMIN D3) 5000 UNITS TABS Take 1 tablet by mouth daily. Reported on 06/26/2015  . ELIQUIS 5 MG TABS tablet 2 (two) times daily.   . famotidine (PEPCID) 20 MG tablet Take 20 mg by mouth 2 (two) times daily.  . hydroxypropyl methylcellulose / hypromellose (ISOPTO TEARS / GONIOVISC) 2.5 % ophthalmic solution Place 2 drops into both eyes as needed for dry eyes. Reported on 08/25/2015  . ibuprofen (ADVIL,MOTRIN) 600 MG tablet Take 1 tablet (600 mg total) by mouth daily as needed (mild pain).  Marland Kitchen ketoconazole (NIZORAL) 2 % cream Apply 1 application topically daily as needed for irritation. Reported on 05/08/2015  . Lactobacillus (ACIDOPHILUS PO) Take 2 capsules by mouth daily.   Marland Kitchen levocetirizine (XYZAL) 5 MG tablet Take 5 mg by mouth every morning.    . lidocaine-prilocaine (EMLA) cream Apply to Porta-cath site 1-2 hours prior to access as directed.  . Melatonin 10 MG TABS Take by mouth.  . methocarbamol (ROBAXIN) 500 MG tablet TK 1 T PO Q 8 H PRF MUSCLE PAIN / RELAXATION  . montelukast (SINGULAIR) 10 MG tablet Take 10 mg by mouth at bedtime. Reported on 10/20/2015  . nystatin (MYCOSTATIN/NYSTOP) 100000 UNIT/GM POWD Apply powder to groin twice a day.  . Omega-3 Fatty Acids (FISH OIL) 1000 MG CAPS Take 2 capsules by mouth daily. Reported on 06/26/2015  . promethazine (PHENERGAN) 25 MG tablet Take 25 mg by mouth every 6 (six) hours as needed for nausea. Reported on 10/20/2015  . sertraline (ZOLOFT) 50 MG tablet Take 50 mg by mouth daily.  . SYMBICORT 160-4.5 MCG/ACT inhaler INHALE 2 PUFFS BY MOUTH TWICE DAILY  . triamcinolone cream (KENALOG) 0.1 % Apply 1 application topically 2 (two) times daily as needed. Reported on 08/25/2015  . triamterene-hydrochlorothiazide (MAXZIDE) 75-50 MG per tablet Take 1 tablet by mouth daily. Reported on 06/12/2015  . vitamin E 400 UNIT capsule Take 400 Units by mouth daily. Reported on 06/26/2015  . amLODipine (NORVASC) 2.5 MG tablet Take 2.5  mg by mouth.  . budesonide-formoterol (SYMBICORT) 160-4.5 MCG/ACT inhaler Inhale 2 puffs into the lungs 2 (two) times daily.  . [DISCONTINUED] ranitidine (ZANTAC) 150 MG tablet Take 150 mg by mouth 2 (two) times daily. Reported on 05/08/2015   Facility-Administered Encounter Medications as of 05/16/2018  Medication  . sodium chloride (PF) 0.9 % injection    Allergy:  Allergies  Allergen Reactions  . Levofloxacin Palpitations    REACTION: tachycardia  . Moxifloxacin Palpitations    REACTION: tachycardia but tolerated cirpofloxacin just fine  . Stadol [Butorphanol] Other (See Comments)    Caused shakes for 6 weeks "shaking for weeks"  . Amoxicillin Rash    Can take 500 mg - amounts greater causes yeast infection REACTION: Rash with high doses. Can take lower doses like  579m  . Asa [Aspirin] Other (See Comments)    Pt stated the 81 mg caused Exacerbation of her asthma  - unsure about other asprins Asthma exacerbation  . Ciprofloxacin Hcl Nausea And Vomiting    Can take lower doses like 5047m Other causes yeast infection Can take lower doses like 50068m . Codeine Nausea Only and Nausea And Vomiting    REACTION: nausea  . Effexor [Venlafaxine] Other (See Comments)    hyperactivity Panic attacks  . Erythromycin Diarrhea and Nausea And Vomiting  . Latex Rash  . Losartan Potassium Nausea Only and Other (See Comments)    headache  . Paroxetine Other (See Comments)    Made nerves worse and caused fatigue  . Rocephin [Ceftriaxone] Diarrhea    diarrhea  . Butorphanol Tartrate     Gave her the shakes for 6 weeks  . Cephalosporins     REACTION: she has tolerated rocephin and Keflex without difficulty  . Clindamycin/Lincomycin Other (See Comments)    Tears stomach up.  . Influenza Vaccines     Pt states that she got really sick from flu vaccine was sick a total of 6 weeks  . Losartan Other (See Comments)    Headache, nausea, fatigue  . Paxil [Paroxetine Hcl] Other (See Comments)    fatigue  . Clarithromycin Diarrhea  . Clindamycin Diarrhea  . Tape Rash    Social Hx:   Social History   Socioeconomic History  . Marital status: Married    Spouse name: Not on file  . Number of children: 0  . Years of education: Not on file  . Highest education level: Not on file  Occupational History  . Occupation: disabled  Social Needs  . Financial resource strain: Not on file  . Food insecurity:    Worry: Not on file    Inability: Not on file  . Transportation needs:    Medical: Not on file    Non-medical: Not on file  Tobacco Use  . Smoking status: Never Smoker  . Smokeless tobacco: Never Used  Substance and Sexual Activity  . Alcohol use: No  . Drug use: No  . Sexual activity: Not on file  Lifestyle  . Physical activity:    Days per week: Not  on file    Minutes per session: Not on file  . Stress: Not on file  Relationships  . Social connections:    Talks on phone: Not on file    Gets together: Not on file    Attends religious service: Not on file    Active member of club or organization: Not on file    Attends meetings of clubs or organizations: Not on file  Relationship status: Not on file  . Intimate partner violence:    Fear of current or ex partner: Not on file    Emotionally abused: Not on file    Physically abused: Not on file    Forced sexual activity: Not on file  Other Topics Concern  . Not on file  Social History Narrative  . Not on file    Past Surgical Hx:  Past Surgical History:  Procedure Laterality Date  . ABDOMINAL HYSTERECTOMY Bilateral 02/11/2015   Procedure: HYSTERECTOMY ABDOMINAL, bilateral salpingo-oophorectomy;  Surgeon: Louretta Shorten, MD;  Location: [facility];  Service: Gynecology;  Laterality: Bilateral;  . DILATATION & CURETTAGE/HYSTEROSCOPY WITH TRUECLEAR N/A 08/17/2013   Procedure: DILATATION & CURETTAGE/HYSTEROSCOPY WITH TRUCLEAR;  Surgeon: Luz Lex, MD;  Location: [facility];  Service: Gynecology;  Laterality: N/A;  . DILATION AND CURETTAGE OF UTERUS    . IR REMOVAL TUN ACCESS W/ PORT W/O FL MOD SED  07/05/2017  . MELANOMA EXCISION  06/2001   right leg; also removed 2 lymph nodes  . OMENTECTOMY N/A 02/11/2015   Procedure: OMENTECTOMY;  Surgeon: Louretta Shorten, MD;  Location: [facility];  Service: Gynecology;  Laterality: N/A;    Past Medical Hx:  Past Medical History:  Diagnosis Date  . Anemia due to antineoplastic chemotherapy 04/16/2015  . Anxiety   . Anxiety state 06/01/2007   Qualifier: Diagnosis of  By: Ronnald Ramp RN, [place], [place]    . Arthritis   . ARTHRITIS 06/01/2007   Qualifier: Diagnosis of  By: Ronnald Ramp RN, CGRN, Sheri    . Asthma   . Asthma exacerbation attacks 03/23/2015  . Candidiasis of vulva and vagina 04/10/2009   Qualifier: Diagnosis of  By: Tommy Medal MD, Roderic Scarce    . CELLULITIS AND  ABSCESS OF LEG EXCEPT FOOT 03/24/2009   Qualifier: Diagnosis of  By: Tommy Medal MD, Roderic Scarce    . CONTACT DERMATITIS&OTHER ECZEMA DUE [place] CAUSE 04/10/2009   Qualifier: Diagnosis of  By: Tommy Medal MD, Roderic Scarce    . Crohn disease ([place])   . Crohn's disease without complication ([place]) 40/97/3532  . CROHN'S DISEASE, SMALL INTESTINE 06/01/2007   Qualifier: Diagnosis of  By: Ronnald Ramp RN, CGRN, Sheri    . Edema 03/24/2009   Qualifier: Diagnosis of  By: Tommy Medal MD, Roderic Scarce    . Folliculitis 12/19/2424  . GASTROESOPHAGEAL REFLUX DISEASE 06/01/2007   Qualifier: Diagnosis of  By: Ronnald Ramp RN, CGRN, Sheri    . 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  The Custom gene panel offered by [facility] includes sequencing and rearrangement analysis for the following 24 genes:  ATM, BAP1, BARD1, BRCA1, BRCA2, BRIP1, CDH1, CDK4, CDKN2A, CHEK2, EPCAM, FANCC, MITF, MLH1, MSH2, MSH6, NBN, PALB2, PMS2, PTEN, RAD51C, RAD51D, TP53, and XRCC2.   The report date is February 13, 2016.  Marland Kitchen GERD (gastroesophageal reflux disease)   . History of melanoma excision 03/23/2015  . Hypertension   . Hypokalemia 04/25/2015  . Insomnia   . Leukopenia due to antineoplastic chemotherapy ([place]) 04/16/2015  . MALIGNANT MELANOMA, HX OF 06/01/2007   Qualifier: History of  By: Ronnald Ramp RN, CGRN, Sheri    . Melanoma ([place])    rt. dorsal leg  . MORBID OBESITY 06/01/2007   Qualifier: Diagnosis of  By: Ronnald Ramp RN, CGRN, Sheri    . Morbid obesity with BMI of 50.0-59.9, adult ([place]) 03/23/2015  . OPEN WOUND OF KNEE LEG AND ANKLE COMPLICATED 83/41/9622   Qualifier: Diagnosis of  By: Patsy Baltimore  RN, Langley Gauss    . Pyoderma gangrenosum   . RECTAL BLEEDING 10/15/2008   Qualifier: Diagnosis of  By: Olevia Perches MD, Lowella Bandy   . Shortness of breath   . Skin cancer 2003   melanoma  . Umbilical hernia without obstruction and without gangrene 03/23/2015  . Urinary, incontinence, stress female 10/27/2016    Past Gynecological History:  See  HPI  No LMP recorded (lmp unknown). Patient has had a hysterectomy.  Family Hx:  Family History  Problem Relation Age of Onset  . Colon polyps Father   . Diabetes Father   . Heart disease Paternal Uncle   . Asthma Mother   . Melanoma Maternal Aunt   . Diabetes Paternal Grandmother   . Alcohol abuse Paternal Grandfather   . Breast cancer Cousin        maternal first cousin  . Colon cancer Neg Hx     Review of Systems:  Constitutional  Feels well,    ENT Normal appearing ears and nares bilaterally Skin/Breast  No rash, sores, jaundice, itching, dryness Cardiovascular  + SOB and edema Pulmonary  +cough  Gastro Intestinal  No nausea, vomitting, or diarrhoea. + bright red blood per rectum, no abdominal pain, change in bowel movement, or constipation.  Genito Urinary  No frequency, urgency, dysuria, see HPI + stress urinary incontinence Musculo Skeletal  No myalgia, arthralgia, joint swelling or pain  Neurologic  No weakness, numbness, change in gait,  Psychology  No depression, anxiety, insomnia.   Vitals:  Blood pressure 133/80, pulse 65, temperature 98.5 F (36.9 C), temperature source Oral, resp. rate 18, height 5' 3"  (1.6 m), weight 243 lb (110.2 kg), SpO2 100 %.  Physical Exam: WD in NAD Neck  Supple NROM, without any enlargements.  Lymph Node Survey No cervical supraclavicular or inguinal adenopathy Cardiovascular  Pulse normal rate, regularity and rhythm. S1 and S2 normal.  Lungs  Clear to auscultation bilateraly, without wheezes/crackles/rhonchi. Good air movement.  Skin  No rash/lesions/breakdown  Psychiatry  Alert and oriented to person, place, and time  Abdomen  Normoactive bowel sounds, abdomen soft, non-tender and obese. Well healed low transverse incision.  Hernia repaired.  Back No CVA tenderness Genito Urinary  Vulva/vagina: Normal external female genitalia.  No lesions. No discharge or bleeding.  Bladder/urethra:  No lesions or masses, well  supported bladder  Vagina: grossly normal with in tact cuff.  Cervix: surgically absent  Uterus: surgically absent    Adnexa: no palpable masses. Rectal  Good tone, no masses no cul de sac nodularity.  Extremities  No bilateral cyanosis, clubbing or edema.   Thereasa Solo, MD  05/16/2018, 3:48 PM

## 2018-05-17 ENCOUNTER — Telehealth: Payer: Self-pay

## 2018-05-17 ENCOUNTER — Telehealth: Payer: Self-pay | Admitting: *Deleted

## 2018-05-17 LAB — CA 125: Cancer Antigen (CA) 125: 7.5 U/mL (ref 0.0–38.1)

## 2018-05-17 NOTE — Telephone Encounter (Signed)
Told Ms [name] that her CA-125 was WNL at 7.5. Patient verbalized understanding.

## 2018-05-17 NOTE — Telephone Encounter (Signed)
Faxed the records and records to [facility]

## 2018-05-19 ENCOUNTER — Telehealth: Payer: Self-pay | Admitting: *Deleted

## 2018-05-19 ENCOUNTER — Telehealth: Payer: Self-pay

## 2018-05-19 NOTE — Telephone Encounter (Signed)
Faxed request for records from Dr. Madie Reno office (GI)

## 2018-05-19 NOTE — Telephone Encounter (Signed)
Outgoing call to patient to return her call.  She had questions about the referrals that Dr Denman George placed.  Pt said she would just wait until [place] returned.  Message left for [facility] to call pt.  No other needs per pt at this time.

## 2018-05-22 ENCOUNTER — Telehealth: Payer: Self-pay | Admitting: *Deleted

## 2018-05-22 ENCOUNTER — Telehealth: Payer: Self-pay | Admitting: Gastroenterology

## 2018-05-22 NOTE — Telephone Encounter (Signed)
Records have been received and placed on Dr.Stark's desk for review.

## 2018-05-22 NOTE — Telephone Encounter (Signed)
Returned the patient's call regarding her referrals. Called Dr. Madie Reno office and they will fax her records today. [facility] and they have received her records and will call her today for an appt. Called and left the patient a message with the above information.

## 2018-05-22 NOTE — Telephone Encounter (Signed)
I am not able to accommodate her request to transfer care. Perhaps someone else in the group has more availability.

## 2018-05-22 NOTE — Telephone Encounter (Signed)
Faxed records from Dr. Bobby Rumpf over to Lorriane Shire at [place]

## 2018-05-23 NOTE — Telephone Encounter (Signed)
Spoke with pt and advised she stated sister recommend Dr.Danis. will place records on Dr. Loletha Carrow desk for review. Thanks.

## 2018-05-24 ENCOUNTER — Telehealth: Payer: Self-pay | Admitting: *Deleted

## 2018-05-24 ENCOUNTER — Encounter: Payer: Self-pay | Admitting: Gastroenterology

## 2018-05-24 NOTE — Telephone Encounter (Signed)
Please make her a new patient appointment with me.  I will review the records when she comes to clinic.

## 2018-05-24 NOTE — Telephone Encounter (Signed)
Patient called and asked for the name/number for [facility]. Information given

## 2018-05-26 NOTE — Telephone Encounter (Signed)
Pt is sched 06/15/2018 @10 :00am

## 2018-05-29 ENCOUNTER — Telehealth: Payer: Self-pay | Admitting: *Deleted

## 2018-05-29 NOTE — Telephone Encounter (Signed)
[name] regarding the referral fax over. They have reached out to the patient and left messages, no return calls. Called and left the patient a message to call [facility]

## 2018-06-02 ENCOUNTER — Telehealth: Payer: Self-pay | Admitting: *Deleted

## 2018-06-02 NOTE — Telephone Encounter (Signed)
Called and spoke confirm the appt for the patient on 3/24

## 2018-06-05 ENCOUNTER — Encounter

## 2018-06-05 ENCOUNTER — Other Ambulatory Visit: Payer: Self-pay | Admitting: *Deleted

## 2018-06-05 ENCOUNTER — Encounter: Payer: Self-pay | Admitting: Cardiovascular Disease

## 2018-06-05 ENCOUNTER — Ambulatory Visit (INDEPENDENT_AMBULATORY_CARE_PROVIDER_SITE_OTHER): Payer: Medicare HMO | Admitting: Cardiovascular Disease

## 2018-06-05 VITALS — BP 128/76 | HR 61 | Ht 63.0 in | Wt 240.4 lb

## 2018-06-05 DIAGNOSIS — I5032 Chronic diastolic (congestive) heart failure: Secondary | ICD-10-CM

## 2018-06-05 DIAGNOSIS — Z6841 Body Mass Index (BMI) 40.0 and over, adult: Secondary | ICD-10-CM | POA: Diagnosis not present

## 2018-06-05 NOTE — Patient Instructions (Signed)
Medication Instructions:  Your physician recommends that you continue on your current medications as directed. Please refer to the Current Medication list given to you today.  If you need a refill on your cardiac medications before your next appointment, please call your pharmacy.   Lab work: None Ordered  If you have labs (blood work) drawn today and your tests are completely normal, you will receive your results only by: Marland Kitchen MyChart Message (if you have MyChart) OR . A paper copy in the mail If you have any lab test that is abnormal or we need to change your treatment, we will call you to review the results.  Testing/Procedures: None Ordered   Follow-Up: At [facility], you and your health needs are our priority.  As part of our continuing mission to provide you with exceptional heart care, we have created designated Provider Care Teams.  These Care Teams include your primary Cardiologist (physician) and Advanced Practice Providers (APPs -  Physician Assistants and Nurse Practitioners) who all work together to provide you with the care you need, when you need it. You will need a follow up appointment in:  1 years.  Please call our office 2 months in advance to schedule this appointment.  You may see Mertie Moores, MD or one of the following Advanced Practice Providers on your designated Care Team: Richardson Dopp, PA-C [place], [place] . Daune Perch, NP   Fat and Cholesterol Restricted Eating Plan Getting too much fat and cholesterol in your diet may cause health problems. Choosing the right foods helps keep your fat and cholesterol at normal levels. This can keep you from getting certain diseases. Your doctor may recommend an eating plan that includes:  Total fat: ______% or less of total calories a day.  Saturated fat: ______% or less of total calories a day.  Cholesterol: less than _________mg a day.  Fiber: ______g a day. What are tips for following this plan? Meal  planning  At meals, divide your plate into four equal parts: ? Fill one-half of your plate with vegetables and green salads. ? Fill one-fourth of your plate with whole grains. ? Fill one-fourth of your plate with low-fat (lean) protein foods.  Eat fish that is high in omega-3 fats at least two times a week. This includes mackerel, tuna, sardines, and salmon.  Eat foods that are high in fiber, such as whole grains, beans, apples, broccoli, carrots, peas, and barley. General tips   Work with your doctor to lose weight if you need to.  Avoid: ? Foods with added sugar. ? Fried foods. ? Foods with partially hydrogenated oils.  Limit alcohol intake to no more than 1 drink a day for nonpregnant women and 2 drinks a day for men. One drink equals 12 oz of beer, 5 oz of wine, or 1 oz of hard liquor. Reading food labels  Check food labels for: ? Trans fats. ? Partially hydrogenated oils. ? Saturated fat (g) in each serving. ? Cholesterol (mg) in each serving. ? Fiber (g) in each serving.  Choose foods with healthy fats, such as: ? Monounsaturated fats. ? Polyunsaturated fats. ? Omega-3 fats.  Choose grain products that have whole grains. Look for the word "whole" as the first word in the ingredient list. Cooking  Cook foods using low-fat methods. These include baking, boiling, grilling, and broiling.  Eat more home-cooked foods. Eat at restaurants and buffets less often.  Avoid cooking using saturated fats, such as butter, cream, palm oil, palm kernel  oil, and coconut oil. Recommended foods  Fruits  All fresh, canned (in natural juice), or frozen fruits. Vegetables  Fresh or frozen vegetables (raw, steamed, roasted, or grilled). Green salads. Grains  Whole grains, such as whole wheat or whole grain breads, crackers, cereals, and pasta. Unsweetened oatmeal, bulgur, barley, quinoa, or brown rice. Corn or whole wheat flour tortillas. Meats and other protein foods  Ground  beef (85% or leaner), grass-fed beef, or beef trimmed of fat. Skinless chicken or [place]. Ground chicken or [place]. Pork trimmed of fat. All fish and seafood. Egg whites. Dried beans, peas, or lentils. Unsalted nuts or seeds. Unsalted canned beans. Nut butters without added sugar or oil. Dairy  Low-fat or nonfat dairy products, such as skim or 1% milk, 2% or reduced-fat cheeses, low-fat and fat-free ricotta or cottage cheese, or plain low-fat and nonfat yogurt. Fats and oils  Tub margarine without trans fats. Light or reduced-fat mayonnaise and salad dressings. Avocado. Olive, canola, sesame, or safflower oils. The items listed above may not be a complete list of foods and beverages you can eat. Contact a dietitian for more information. Foods to avoid Fruits  Canned fruit in heavy syrup. Fruit in cream or butter sauce. Fried fruit. Vegetables  Vegetables cooked in cheese, cream, or butter sauce. Fried vegetables. Grains  White bread. White pasta. White rice. Cornbread. Bagels, pastries, and croissants. Crackers and snack foods that contain trans fat and hydrogenated oils. Meats and other protein foods  Fatty cuts of meat. Ribs, chicken wings, bacon, sausage, bologna, salami, chitterlings, fatback, hot dogs, bratwurst, and packaged lunch meats. Liver and organ meats. Whole eggs and egg yolks. Chicken and [place] with skin. Fried meat. Dairy  Whole or 2% milk, cream, half-and-half, and cream cheese. Whole milk cheeses. Whole-fat or sweetened yogurt. Full-fat cheeses. Nondairy creamers and whipped toppings. Processed cheese, cheese spreads, and cheese curds. Beverages  Alcohol. Sugar-sweetened drinks such as sodas, lemonade, and fruit drinks. Fats and oils  Butter, stick margarine, lard, shortening, ghee, or bacon fat. Coconut, palm kernel, and palm oils. Sweets and desserts  Corn syrup, sugars, honey, and molasses. Candy. Jam and jelly. Syrup. Sweetened cereals. Cookies, pies, cakes,  donuts, muffins, and ice cream. The items listed above may not be a complete list of foods and beverages you should avoid. Contact a dietitian for more information. Summary  Choosing the right foods helps keep your fat and cholesterol at normal levels. This can keep you from getting certain diseases.  At meals, fill one-half of your plate with vegetables and green salads.  Eat high-fiber foods, like whole grains, beans, apples, carrots, peas, and barley.  Limit added sugar, saturated fats, alcohol, and fried foods. This information is not intended to replace advice given to you by your health care provider. Make sure you discuss any questions you have with your health care provider. Document Released: 09/28/2011 Document Revised: 11/30/2017 Document Reviewed: 12/14/2016 Elsevier Interactive Patient Education  2019 [place] DASH stands for "Dietary Approaches to Stop Hypertension." The DASH eating plan is a healthy eating plan that has been shown to reduce high blood pressure (hypertension). It may also reduce your risk for type 2 diabetes, heart disease, and stroke. The DASH eating plan may also help with weight loss. What are tips for following this plan?  General guidelines  Avoid eating more than 2,300 mg (milligrams) of salt (sodium) a day. If you have hypertension, you may need to reduce your sodium intake to 1,500 mg a day.  Limit alcohol intake to no more than 1 drink a day for nonpregnant women and 2 drinks a day for men. One drink equals 12 oz of beer, 5 oz of wine, or 1 oz of hard liquor.  Work with your health care provider to maintain a healthy body weight or to lose weight. Ask what an ideal weight is for you.  Get at least 30 minutes of exercise that causes your heart to beat faster (aerobic exercise) most days of the week. Activities may include walking, swimming, or biking.  Work with your health care provider or diet and nutrition specialist  (dietitian) to adjust your eating plan to your individual calorie needs. Reading food labels   Check food labels for the amount of sodium per serving. Choose foods with less than 5 percent of the Daily Value of sodium. Generally, foods with less than 300 mg of sodium per serving fit into this eating plan.  To find whole grains, look for the word "whole" as the first word in the ingredient list. Shopping  Buy products labeled as "low-sodium" or "no salt added."  Buy fresh foods. Avoid canned foods and premade or frozen meals. Cooking  Avoid adding salt when cooking. Use salt-free seasonings or herbs instead of table salt or sea salt. Check with your health care provider or pharmacist before using salt substitutes.  Do not fry foods. Cook foods using healthy methods such as baking, boiling, grilling, and broiling instead.  Cook with heart-healthy oils, such as olive, canola, soybean, or sunflower oil. Meal planning  Eat a balanced diet that includes: ? 5 or more servings of fruits and vegetables each day. At each meal, try to fill half of your plate with fruits and vegetables. ? Up to 6-8 servings of whole grains each day. ? Less than 6 oz of lean meat, poultry, or fish each day. A 3-oz serving of meat is about the same size as a deck of cards. One egg equals 1 oz. ? 2 servings of low-fat dairy each day. ? A serving of nuts, seeds, or beans 5 times each week. ? Heart-healthy fats. Healthy fats called Omega-3 fatty acids are found in foods such as flaxseeds and coldwater fish, like sardines, salmon, and mackerel.  Limit how much you eat of the following: ? Canned or prepackaged foods. ? Food that is high in trans fat, such as fried foods. ? Food that is high in saturated fat, such as fatty meat. ? Sweets, desserts, sugary drinks, and other foods with added sugar. ? Full-fat dairy products.  Do not salt foods before eating.  Try to eat at least 2 vegetarian meals each week.  Eat  more home-cooked food and less restaurant, buffet, and fast food.  When eating at a restaurant, ask that your food be prepared with less salt or no salt, if possible. What foods are recommended? The items listed may not be a complete list. Talk with your dietitian about what dietary choices are best for you. Grains Whole-grain or whole-wheat bread. Whole-grain or whole-wheat pasta. Brown rice. [place]. Bulgur. Whole-grain and low-sodium cereals. Pita bread. Low-fat, low-sodium crackers. Whole-wheat flour tortillas. Vegetables Fresh or frozen vegetables (raw, steamed, roasted, or grilled). Low-sodium or reduced-sodium tomato and vegetable juice. Low-sodium or reduced-sodium tomato sauce and tomato paste. Low-sodium or reduced-sodium canned vegetables. Fruits All fresh, dried, or frozen fruit. Canned fruit in natural juice (without added sugar). Meat and other protein foods Skinless chicken or [place]. Ground chicken or [place]. Pork with fat  trimmed off. Fish and seafood. Egg whites. Dried beans, peas, or lentils. Unsalted nuts, nut butters, and seeds. Unsalted canned beans. Lean cuts of beef with fat trimmed off. Low-sodium, lean deli meat. Dairy Low-fat (1%) or fat-free (skim) milk. Fat-free, low-fat, or reduced-fat cheeses. Nonfat, low-sodium ricotta or cottage cheese. Low-fat or nonfat yogurt. Low-fat, low-sodium cheese. Fats and oils Soft margarine without trans fats. Vegetable oil. Low-fat, reduced-fat, or light mayonnaise and salad dressings (reduced-sodium). Canola, safflower, olive, soybean, and sunflower oils. Avocado. Seasoning and other foods Herbs. Spices. Seasoning mixes without salt. Unsalted popcorn and pretzels. Fat-free sweets. What foods are not recommended? The items listed may not be a complete list. Talk with your dietitian about what dietary choices are best for you. Grains Baked goods made with fat, such as croissants, muffins, or some breads. Dry pasta or rice meal  packs. Vegetables Creamed or fried vegetables. Vegetables in a cheese sauce. Regular canned vegetables (not low-sodium or reduced-sodium). Regular canned tomato sauce and paste (not low-sodium or reduced-sodium). Regular tomato and vegetable juice (not low-sodium or reduced-sodium). Angie Fava. Olives. Fruits Canned fruit in a light or heavy syrup. Fried fruit. Fruit in cream or butter sauce. Meat and other protein foods Fatty cuts of meat. Ribs. Fried meat. Berniece Salines. Sausage. Bologna and other processed lunch meats. Salami. Fatback. Hotdogs. Bratwurst. Salted nuts and seeds. Canned beans with added salt. Canned or smoked fish. Whole eggs or egg yolks. Chicken or [place] with skin. Dairy Whole or 2% milk, cream, and half-and-half. Whole or full-fat cream cheese. Whole-fat or sweetened yogurt. Full-fat cheese. Nondairy creamers. Whipped toppings. Processed cheese and cheese spreads. Fats and oils Butter. Stick margarine. Lard. Shortening. Ghee. Bacon fat. Tropical oils, such as coconut, palm kernel, or palm oil. Seasoning and other foods Salted popcorn and pretzels. Onion salt, garlic salt, seasoned salt, table salt, and sea salt. Worcestershire sauce. Tartar sauce. Barbecue sauce. Teriyaki sauce. Soy sauce, including reduced-sodium. Steak sauce. Canned and packaged gravies. Fish sauce. Oyster sauce. Cocktail sauce. Horseradish that you find on the shelf. Ketchup. Mustard. Meat flavorings and tenderizers. Bouillon cubes. Hot sauce and Tabasco sauce. Premade or packaged marinades. Premade or packaged taco seasonings. Relishes. Regular salad dressings. Where to find more information:  National Heart, Lung, and [place]: https://wilson-eaton.com/  American Heart Association: www.heart.org Summary  The DASH eating plan is a healthy eating plan that has been shown to reduce high blood pressure (hypertension). It may also reduce your risk for type 2 diabetes, heart disease, and stroke.  With the DASH eating  plan, you should limit salt (sodium) intake to 2,300 mg a day. If you have hypertension, you may need to reduce your sodium intake to 1,500 mg a day.  When on the DASH eating plan, aim to eat more fresh fruits and vegetables, whole grains, lean proteins, low-fat dairy, and heart-healthy fats.  Work with your health care provider or diet and nutrition specialist (dietitian) to adjust your eating plan to your individual calorie needs. This information is not intended to replace advice given to you by your health care provider. Make sure you discuss any questions you have with your health care provider. Document Released: 03/18/2011 Document Revised: 03/22/2016 Document Reviewed: 03/22/2016 Elsevier Interactive Patient Education  2019 [employer].

## 2018-06-05 NOTE — Progress Notes (Signed)
Cardiology Office Note:    Date:  06/05/2018   ID:  [name], DOB 1960-12-20, MRN [id number]  PCP:  Kaylee Bills, MD  Cardiologist:  Mertie Moores, MD  Electrophysiologist:  None   Referring MD: Kaylee Bills, MD   Problem list 1.  Chronic diastolic congestive heart failure-grade 2 diastolic dysfunction 2.  Morbid obesity  3.  Trace tricuspid regurgitation 4.  Mild pulmonary hypertension    Chief Complaint  Patient presents with  . Shortness of Breath        [name] is a 58 y.o. female with a hx of chronic diastolic CHF.   Had an echo in 2019 that showed grade 2 diastolic dysfundtion  No CP , no angina . Eats out 3 times a week.   Eats bacon occasionally  No syncope She is on disability - arthritis, back trouble  Has had a sleep study    Past Medical History:  Diagnosis Date  . Anemia due to antineoplastic chemotherapy 04/16/2015  . Anxiety   . Anxiety state 06/01/2007   Qualifier: Diagnosis of  By: Ronnald Ramp RN, Riner, [place]    . Arthritis   . ARTHRITIS 06/01/2007   Qualifier: Diagnosis of  By: Ronnald Ramp RN, CGRN, Sheri    . Asthma   . Asthma exacerbation attacks 03/23/2015  . Candidiasis of vulva and vagina 04/10/2009   Qualifier: Diagnosis of  By: Tommy Medal MD, Roderic Scarce    . CELLULITIS AND ABSCESS OF LEG EXCEPT FOOT 03/24/2009   Qualifier: Diagnosis of  By: Tommy Medal MD, Roderic Scarce    . CONTACT DERMATITIS&OTHER ECZEMA DUE [place] CAUSE 04/10/2009   Qualifier: Diagnosis of  By: Tommy Medal MD, Roderic Scarce    . Crohn disease ([place])   . Crohn's disease without complication ([place]) 40/98/1191  . CROHN'S DISEASE, SMALL INTESTINE 06/01/2007   Qualifier: Diagnosis of  By: Ronnald Ramp RN, CGRN, Sheri    . Edema 03/24/2009   Qualifier: Diagnosis of  By: Tommy Medal MD, Roderic Scarce    . Folliculitis 07/17/8293  . GASTROESOPHAGEAL REFLUX DISEASE 06/01/2007   Qualifier: Diagnosis of  By: Ronnald Ramp RN, CGRN, Sheri    . 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  The Custom gene panel offered by [facility] includes sequencing and rearrangement analysis for the following 24 genes:  ATM, BAP1, BARD1, BRCA1, BRCA2, BRIP1, CDH1, CDK4, CDKN2A, CHEK2, EPCAM, FANCC, MITF, MLH1, MSH2, MSH6, NBN, PALB2, PMS2, PTEN, RAD51C, RAD51D, TP53, and XRCC2.   The report date is February 13, 2016.  Marland Kitchen GERD (gastroesophageal reflux disease)   . History of melanoma excision 03/23/2015  . Hypertension   . Hypokalemia 04/25/2015  . Insomnia   . Leukopenia due to antineoplastic chemotherapy ([place]) 04/16/2015  . MALIGNANT MELANOMA, HX OF 06/01/2007   Qualifier: History of  By: Ronnald Ramp RN, CGRN, Sheri    . Melanoma ([place])    rt. dorsal leg  . MORBID OBESITY 06/01/2007   Qualifier: Diagnosis of  By: Ronnald Ramp RN, CGRN, Sheri    . Morbid obesity with BMI of 50.0-59.9, adult ([employer]) 03/23/2015  . OPEN WOUND OF KNEE LEG AND ANKLE COMPLICATED 62/13/0865   Qualifier: Diagnosis of  By: Patsy Baltimore RN, Langley Gauss    . Pyoderma gangrenosum   . RECTAL BLEEDING 10/15/2008   Qualifier: Diagnosis of  By: Olevia Perches MD, Lowella Bandy   . Shortness of breath   . Skin cancer 2003   melanoma  . Umbilical hernia without obstruction and without gangrene 03/23/2015  .  Urinary, incontinence, stress female 10/27/2016    Past Surgical History:  Procedure Laterality Date  . ABDOMINAL HYSTERECTOMY Bilateral 02/11/2015   Procedure: HYSTERECTOMY ABDOMINAL, bilateral salpingo-oophorectomy;  Surgeon: Louretta Shorten, MD;  Location: [facility];  Service: Gynecology;  Laterality: Bilateral;  . DILATATION & CURETTAGE/HYSTEROSCOPY WITH TRUECLEAR N/A 08/17/2013   Procedure: DILATATION & CURETTAGE/HYSTEROSCOPY WITH TRUCLEAR;  Surgeon: Luz Lex, MD;  Location: [facility];  Service: Gynecology;  Laterality: N/A;  . DILATION AND CURETTAGE OF UTERUS    . IR REMOVAL TUN ACCESS W/ PORT W/O FL MOD SED  07/05/2017  . MELANOMA EXCISION  06/2001   right leg; also removed 2 lymph nodes  . OMENTECTOMY N/A 02/11/2015   Procedure: OMENTECTOMY;   Surgeon: Louretta Shorten, MD;  Location: [facility];  Service: Gynecology;  Laterality: N/A;    Current Medications: Current Meds  Medication Sig  . albuterol (PROVENTIL HFA;VENTOLIN HFA) 108 (90 Base) MCG/ACT inhaler Inhale 2 puffs into the lungs every 4 (four) hours as needed for wheezing or shortness of breath.  . alprazolam (XANAX) 2 MG tablet Take 2 mg by mouth 2 (two) times daily. 1/2 tab AM and 1 tab PM  . ARIPiprazole (ABILIFY) 2 MG tablet   . Ascorbic Acid (VITAMIN C) 1000 MG tablet Take 1,000 mg by mouth daily.  . cetirizine (ZYRTEC) 10 MG tablet Take by mouth.  . Cholecalciferol (VITAMIN D3) 5000 UNITS TABS Take 1 tablet by mouth daily. Reported on 06/26/2015  . doxycycline (VIBRAMYCIN) 100 MG capsule Take 100 mg by mouth daily as needed.   . famotidine (PEPCID) 20 MG tablet Take 20 mg by mouth 2 (two) times daily.  Marland Kitchen ibuprofen (ADVIL,MOTRIN) 600 MG tablet Take 1 tablet (600 mg total) by mouth daily as needed (mild pain).  . Lactobacillus (ACIDOPHILUS PO) Take 2 capsules by mouth daily.   . Melatonin 10 MG TABS Take by mouth.  . methocarbamol (ROBAXIN) 500 MG tablet TK 1 T PO Q 8 H PRF MUSCLE PAIN / RELAXATION  . montelukast (SINGULAIR) 10 MG tablet Take 10 mg by mouth at bedtime. Reported on 10/20/2015  . Nebulizers MISC 1 Units by Misc.(Non-Drug; Combo Route) route every 8 (eight) hours as needed.  . Omega-3 Fatty Acids (FISH OIL) 1000 MG CAPS Take 2 capsules by mouth daily. Reported on 06/26/2015  . sertraline (ZOLOFT) 50 MG tablet Take 50 mg by mouth daily.  . SYMBICORT 160-4.5 MCG/ACT inhaler INHALE 2 PUFFS BY MOUTH TWICE DAILY  . triamcinolone cream (KENALOG) 0.1 % Apply 1 application topically 2 (two) times daily as needed. Reported on 08/25/2015  . triamterene-hydrochlorothiazide (MAXZIDE) 75-50 MG per tablet Take 1 tablet by mouth daily. Reported on 06/12/2015  . vitamin E 400 UNIT capsule Take 400 Units by mouth daily. Reported on 06/26/2015     Allergies:   Levofloxacin;  Moxifloxacin; Stadol [butorphanol]; Amoxicillin; Asa [aspirin]; Ciprofloxacin hcl; Codeine; Effexor [venlafaxine]; Erythromycin; Latex; Losartan potassium; Paroxetine; Rocephin [ceftriaxone]; Butorphanol tartrate; Cephalosporins; Clindamycin/lincomycin; Influenza vaccines; Losartan; Paxil [paroxetine hcl]; Clarithromycin; Clindamycin; and Tape   Social History   Socioeconomic History  . Marital status: Married    Spouse name: Not on file  . Number of children: 0  . Years of education: Not on file  . Highest education level: Not on file  Occupational History  . Occupation: disabled  Social Needs  . Financial resource strain: Not on file  . Food insecurity:    Worry: Not on file    Inability: Not on file  . Transportation needs:  Medical: Not on file    Non-medical: Not on file  Tobacco Use  . Smoking status: Never Smoker  . Smokeless tobacco: Never Used  Substance and Sexual Activity  . Alcohol use: No  . Drug use: No  . Sexual activity: Not on file  Lifestyle  . Physical activity:    Days per week: Not on file    Minutes per session: Not on file  . Stress: Not on file  Relationships  . Social connections:    Talks on phone: Not on file    Gets together: Not on file    Attends religious service: Not on file    Active member of club or organization: Not on file    Attends meetings of clubs or organizations: Not on file    Relationship status: Not on file  Other Topics Concern  . Not on file  Social History Narrative  . Not on file     Family History: The patient's family history includes Alcohol abuse in her paternal grandfather; Asthma in her mother; Breast cancer in her cousin; Colon polyps in her father; Diabetes in her father and paternal grandmother; Heart disease in her paternal uncle; Melanoma in her maternal aunt. There is no history of Colon cancer.  ROS:   Please see the history of present illness.     All other systems reviewed and are  negative.  EKGs/Labs/Other Studies Reviewed:    The following studies were reviewed today:   EKG:   June 05, 2018: Sinus bradycardia 58.  Otherwise normal EKG.  Recent Labs: 07/05/2017: Hemoglobin 11.1; Platelets 243 05/16/2018: BUN 19; Creatinine, Ser 0.75; Potassium 3.7; Sodium 136  Recent Lipid Panel No results found for: CHOL, TRIG, HDL, CHOLHDL, VLDL, LDLCALC, LDLDIRECT  Physical Exam:    VS:  BP 128/76   Pulse 61   Ht _0  (1.6 m)   Wt 240 lb 6.4 oz (109 kg)   LMP  (LMP Unknown)   SpO2 97%   BMI 42.58 kg/m     Wt Readings from Last 3 Encounters:  06/05/18 240 lb 6.4 oz (109 kg)  05/16/18 243 lb (110.2 kg)  11/12/17 255 lb (115.7 kg)     GEN: Morbidly obese, middle-aged female, no acute distress walking with the assistance of a cane. HEENT: Normal NECK: No JVD; No carotid bruits LYMPHATICS: No lymphadenopathy CARDIAC:   RR  RESPIRATORY:  Clear to auscultation without rales, wheezing or rhonchi  ABDOMEN: Soft, non-tender, non-distended MUSCULOSKELETAL:  No edema; No deformity  SKIN: Warm and dry NEUROLOGIC:  Alert and oriented x 3 PSYCHIATRIC:  Normal affect   ASSESSMENT:    1. Chronic diastolic (congestive) heart failure ([place])   2. Morbid obesity with BMI of 45.0-49.9, adult ([place])    PLAN:    In order of problems listed above:  1. Chronic diastolic congestive heart failure: The patient had an echocardiogram performed last year which revealed normal left ventricular systolic function.  She has grade 2 diastolic dysfunction.   Discussed the possible contributors to her diastolic heart function.  I encouraged her to work on a weight loss program.  I encouraged her to  Work on weight loss       Medication Adjustments/Labs and Tests Ordered: Current medicines are reviewed at length with the patient today.  Concerns regarding medicines are outlined above.  Orders Placed This Encounter  Procedures  . EKG 12-Lead   No orders of the defined types were  placed in this encounter.  Patient Instructions  Medication Instructions:  Your physician recommends that you continue on your current medications as directed. Please refer to the Current Medication list given to you today.  If you need a refill on your cardiac medications before your next appointment, please call your pharmacy.   Lab work: None Ordered  If you have labs (blood work) drawn today and your tests are completely normal, you will receive your results only by: Marland Kitchen MyChart Message (if you have MyChart) OR . A paper copy in the mail If you have any lab test that is abnormal or we need to change your treatment, we will call you to review the results.  Testing/Procedures: None Ordered   Follow-Up: At [facility], you and your health needs are our priority.  As part of our continuing mission to provide you with exceptional heart care, we have created designated Provider Care Teams.  These Care Teams include your primary Cardiologist (physician) and Advanced Practice Providers (APPs -  Physician Assistants and Nurse Practitioners) who all work together to provide you with the care you need, when you need it. You will need a follow up appointment in:  1 years.  Please call our office 2 months in advance to schedule this appointment.  You may see Mertie Moores, MD or one of the following Advanced Practice Providers on your designated Care Team: Richardson Dopp, PA-C [place], [place] . Daune Perch, NP   Fat and Cholesterol Restricted Eating Plan Getting too much fat and cholesterol in your diet may cause health problems. Choosing the right foods helps keep your fat and cholesterol at normal levels. This can keep you from getting certain diseases. Your doctor may recommend an eating plan that includes:  Total fat: ______% or less of total calories a day.  Saturated fat: ______% or less of total calories a day.  Cholesterol: less than _________mg a day.  Fiber: ______g a  day. What are tips for following this plan? Meal planning  At meals, divide your plate into four equal parts: ? Fill one-half of your plate with vegetables and green salads. ? Fill one-fourth of your plate with whole grains. ? Fill one-fourth of your plate with low-fat (lean) protein foods.  Eat fish that is high in omega-3 fats at least two times a week. This includes mackerel, tuna, sardines, and salmon.  Eat foods that are high in fiber, such as whole grains, beans, apples, broccoli, carrots, peas, and barley. General tips   Work with your doctor to lose weight if you need to.  Avoid: ? Foods with added sugar. ? Fried foods. ? Foods with partially hydrogenated oils.  Limit alcohol intake to no more than 1 drink a day for nonpregnant women and 2 drinks a day for men. One drink equals 12 oz of beer, 5 oz of wine, or 1 oz of hard liquor. Reading food labels  Check food labels for: ? Trans fats. ? Partially hydrogenated oils. ? Saturated fat (g) in each serving. ? Cholesterol (mg) in each serving. ? Fiber (g) in each serving.  Choose foods with healthy fats, such as: ? Monounsaturated fats. ? Polyunsaturated fats. ? Omega-3 fats.  Choose grain products that have whole grains. Look for the word "whole" as the first word in the ingredient list. Cooking  Cook foods using low-fat methods. These include baking, boiling, grilling, and broiling.  Eat more home-cooked foods. Eat at restaurants and buffets less often.  Avoid cooking using saturated fats, such as butter, cream, palm  oil, palm kernel oil, and coconut oil. Recommended foods  Fruits  All fresh, canned (in natural juice), or frozen fruits. Vegetables  Fresh or frozen vegetables (raw, steamed, roasted, or grilled). Green salads. Grains  Whole grains, such as whole wheat or whole grain breads, crackers, cereals, and pasta. Unsweetened oatmeal, bulgur, barley, quinoa, or brown rice. Corn or whole wheat flour  tortillas. Meats and other protein foods  Ground beef (85% or leaner), grass-fed beef, or beef trimmed of fat. Skinless chicken or [place]. Ground chicken or [place]. Pork trimmed of fat. All fish and seafood. Egg whites. Dried beans, peas, or lentils. Unsalted nuts or seeds. Unsalted canned beans. Nut butters without added sugar or oil. Dairy  Low-fat or nonfat dairy products, such as skim or 1% milk, 2% or reduced-fat cheeses, low-fat and fat-free ricotta or cottage cheese, or plain low-fat and nonfat yogurt. Fats and oils  Tub margarine without trans fats. Light or reduced-fat mayonnaise and salad dressings. Avocado. Olive, canola, sesame, or safflower oils. The items listed above may not be a complete list of foods and beverages you can eat. Contact a dietitian for more information. Foods to avoid Fruits  Canned fruit in heavy syrup. Fruit in cream or butter sauce. Fried fruit. Vegetables  Vegetables cooked in cheese, cream, or butter sauce. Fried vegetables. Grains  White bread. White pasta. White rice. Cornbread. Bagels, pastries, and croissants. Crackers and snack foods that contain trans fat and hydrogenated oils. Meats and other protein foods  Fatty cuts of meat. Ribs, chicken wings, bacon, sausage, bologna, salami, chitterlings, fatback, hot dogs, bratwurst, and packaged lunch meats. Liver and organ meats. Whole eggs and egg yolks. Chicken and [place] with skin. Fried meat. Dairy  Whole or 2% milk, cream, half-and-half, and cream cheese. Whole milk cheeses. Whole-fat or sweetened yogurt. Full-fat cheeses. Nondairy creamers and whipped toppings. Processed cheese, cheese spreads, and cheese curds. Beverages  Alcohol. Sugar-sweetened drinks such as sodas, lemonade, and fruit drinks. Fats and oils  Butter, stick margarine, lard, shortening, ghee, or bacon fat. Coconut, palm kernel, and palm oils. Sweets and desserts  Corn syrup, sugars, honey, and molasses. Candy. Jam and jelly.  Syrup. Sweetened cereals. Cookies, pies, cakes, donuts, muffins, and ice cream. The items listed above may not be a complete list of foods and beverages you should avoid. Contact a dietitian for more information. Summary  Choosing the right foods helps keep your fat and cholesterol at normal levels. This can keep you from getting certain diseases.  At meals, fill one-half of your plate with vegetables and green salads.  Eat high-fiber foods, like whole grains, beans, apples, carrots, peas, and barley.  Limit added sugar, saturated fats, alcohol, and fried foods. This information is not intended to replace advice given to you by your health care provider. Make sure you discuss any questions you have with your health care provider. Document Released: 09/28/2011 Document Revised: 11/30/2017 Document Reviewed: 12/14/2016 Elsevier Interactive Patient Education  2019 [place] DASH stands for "Dietary Approaches to Stop Hypertension." The DASH eating plan is a healthy eating plan that has been shown to reduce high blood pressure (hypertension). It may also reduce your risk for type 2 diabetes, heart disease, and stroke. The DASH eating plan may also help with weight loss. What are tips for following this plan?  General guidelines  Avoid eating more than 2,300 mg (milligrams) of salt (sodium) a day. If you have hypertension, you may need to reduce your sodium intake to 1,500  mg a day.  Limit alcohol intake to no more than 1 drink a day for nonpregnant women and 2 drinks a day for men. One drink equals 12 oz of beer, 5 oz of wine, or 1 oz of hard liquor.  Work with your health care provider to maintain a healthy body weight or to lose weight. Ask what an ideal weight is for you.  Get at least 30 minutes of exercise that causes your heart to beat faster (aerobic exercise) most days of the week. Activities may include walking, swimming, or biking.  Work with your health care  provider or diet and nutrition specialist (dietitian) to adjust your eating plan to your individual calorie needs. Reading food labels   Check food labels for the amount of sodium per serving. Choose foods with less than 5 percent of the Daily Value of sodium. Generally, foods with less than 300 mg of sodium per serving fit into this eating plan.  To find whole grains, look for the word "whole" as the first word in the ingredient list. Shopping  Buy products labeled as "low-sodium" or "no salt added."  Buy fresh foods. Avoid canned foods and premade or frozen meals. Cooking  Avoid adding salt when cooking. Use salt-free seasonings or herbs instead of table salt or sea salt. Check with your health care provider or pharmacist before using salt substitutes.  Do not fry foods. Cook foods using healthy methods such as baking, boiling, grilling, and broiling instead.  Cook with heart-healthy oils, such as olive, canola, soybean, or sunflower oil. Meal planning  Eat a balanced diet that includes: ? 5 or more servings of fruits and vegetables each day. At each meal, try to fill half of your plate with fruits and vegetables. ? Up to 6-8 servings of whole grains each day. ? Less than 6 oz of lean meat, poultry, or fish each day. A 3-oz serving of meat is about the same size as a deck of cards. One egg equals 1 oz. ? 2 servings of low-fat dairy each day. ? A serving of nuts, seeds, or beans 5 times each week. ? Heart-healthy fats. Healthy fats called Omega-3 fatty acids are found in foods such as flaxseeds and coldwater fish, like sardines, salmon, and mackerel.  Limit how much you eat of the following: ? Canned or prepackaged foods. ? Food that is high in trans fat, such as fried foods. ? Food that is high in saturated fat, such as fatty meat. ? Sweets, desserts, sugary drinks, and other foods with added sugar. ? Full-fat dairy products.  Do not salt foods before eating.  Try to eat at  least 2 vegetarian meals each week.  Eat more home-cooked food and less restaurant, buffet, and fast food.  When eating at a restaurant, ask that your food be prepared with less salt or no salt, if possible. What foods are recommended? The items listed may not be a complete list. Talk with your dietitian about what dietary choices are best for you. Grains Whole-grain or whole-wheat bread. Whole-grain or whole-wheat pasta. Brown rice. [place]. Bulgur. Whole-grain and low-sodium cereals. Pita bread. Low-fat, low-sodium crackers. Whole-wheat flour tortillas. Vegetables Fresh or frozen vegetables (raw, steamed, roasted, or grilled). Low-sodium or reduced-sodium tomato and vegetable juice. Low-sodium or reduced-sodium tomato sauce and tomato paste. Low-sodium or reduced-sodium canned vegetables. Fruits All fresh, dried, or frozen fruit. Canned fruit in natural juice (without added sugar). Meat and other protein foods Skinless chicken or [place]. Ground chicken or  [place]. Pork with fat trimmed off. Fish and seafood. Egg whites. Dried beans, peas, or lentils. Unsalted nuts, nut butters, and seeds. Unsalted canned beans. Lean cuts of beef with fat trimmed off. Low-sodium, lean deli meat. Dairy Low-fat (1%) or fat-free (skim) milk. Fat-free, low-fat, or reduced-fat cheeses. Nonfat, low-sodium ricotta or cottage cheese. Low-fat or nonfat yogurt. Low-fat, low-sodium cheese. Fats and oils Soft margarine without trans fats. Vegetable oil. Low-fat, reduced-fat, or light mayonnaise and salad dressings (reduced-sodium). Canola, safflower, olive, soybean, and sunflower oils. Avocado. Seasoning and other foods Herbs. Spices. Seasoning mixes without salt. Unsalted popcorn and pretzels. Fat-free sweets. What foods are not recommended? The items listed may not be a complete list. Talk with your dietitian about what dietary choices are best for you. Grains Baked goods made with fat, such as croissants,  muffins, or some breads. Dry pasta or rice meal packs. Vegetables Creamed or fried vegetables. Vegetables in a cheese sauce. Regular canned vegetables (not low-sodium or reduced-sodium). Regular canned tomato sauce and paste (not low-sodium or reduced-sodium). Regular tomato and vegetable juice (not low-sodium or reduced-sodium). Angie Fava. Olives. Fruits Canned fruit in a light or heavy syrup. Fried fruit. Fruit in cream or butter sauce. Meat and other protein foods Fatty cuts of meat. Ribs. Fried meat. Berniece Salines. Sausage. Bologna and other processed lunch meats. Salami. Fatback. Hotdogs. Bratwurst. Salted nuts and seeds. Canned beans with added salt. Canned or smoked fish. Whole eggs or egg yolks. Chicken or [place] with skin. Dairy Whole or 2% milk, cream, and half-and-half. Whole or full-fat cream cheese. Whole-fat or sweetened yogurt. Full-fat cheese. Nondairy creamers. Whipped toppings. Processed cheese and cheese spreads. Fats and oils Butter. Stick margarine. Lard. Shortening. Ghee. Bacon fat. Tropical oils, such as coconut, palm kernel, or palm oil. Seasoning and other foods Salted popcorn and pretzels. Onion salt, garlic salt, seasoned salt, table salt, and sea salt. Worcestershire sauce. Tartar sauce. Barbecue sauce. Teriyaki sauce. Soy sauce, including reduced-sodium. Steak sauce. Canned and packaged gravies. Fish sauce. Oyster sauce. Cocktail sauce. Horseradish that you find on the shelf. Ketchup. Mustard. Meat flavorings and tenderizers. Bouillon cubes. Hot sauce and Tabasco sauce. Premade or packaged marinades. Premade or packaged taco seasonings. Relishes. Regular salad dressings. Where to find more information:  National Heart, Lung, and [place]: https://wilson-eaton.com/  American Heart Association: www.heart.org Summary  The DASH eating plan is a healthy eating plan that has been shown to reduce high blood pressure (hypertension). It may also reduce your risk for type 2 diabetes,  heart disease, and stroke.  With the DASH eating plan, you should limit salt (sodium) intake to 2,300 mg a day. If you have hypertension, you may need to reduce your sodium intake to 1,500 mg a day.  When on the DASH eating plan, aim to eat more fresh fruits and vegetables, whole grains, lean proteins, low-fat dairy, and heart-healthy fats.  Work with your health care provider or diet and nutrition specialist (dietitian) to adjust your eating plan to your individual calorie needs. This information is not intended to replace advice given to you by your health care provider. Make sure you discuss any questions you have with your health care provider. Document Released: 03/18/2011 Document Revised: 03/22/2016 Document Reviewed: 03/22/2016 Elsevier Interactive Patient Education  2019 [place], Mertie Moores, MD  06/05/2018 9:36 AM    [place] [place]

## 2018-06-08 ENCOUNTER — Telehealth: Payer: Self-pay | Admitting: *Deleted

## 2018-06-08 NOTE — Telephone Encounter (Signed)
Received a fax that the patient has an appt with Dr. Matilde Sprang on 07/05/2018 at 1:15pm

## 2018-06-15 ENCOUNTER — Ambulatory Visit: Payer: Medicare HMO | Admitting: Gastroenterology

## 2018-06-15 ENCOUNTER — Encounter: Payer: Self-pay | Admitting: Gastroenterology

## 2018-06-15 VITALS — BP 130/74 | HR 68 | Ht 64.0 in | Wt 237.2 lb

## 2018-06-15 DIAGNOSIS — R194 Change in bowel habit: Secondary | ICD-10-CM | POA: Diagnosis not present

## 2018-06-15 DIAGNOSIS — L0232 Furuncle of buttock: Secondary | ICD-10-CM

## 2018-06-15 MED ORDER — DOXYCYCLINE HYCLATE 50 MG PO CAPS: 100.0000 mg | ORAL_CAPSULE | Freq: Two times a day (BID) | ORAL | 0 refills | Status: AC

## 2018-06-15 NOTE — Patient Instructions (Addendum)
If you are age 58 or older, your body mass index should be between 23-30. Your Body mass index is 40.72 kg/m. If this is out of the aforementioned range listed, please consider follow up with your Primary Care Provider.  If you are age 45 or younger, your body mass index should be between 19-25. Your Body mass index is 40.72 kg/m. If this is out of the aformentioned range listed, please consider follow up with your Primary Care Provider.   We have sent the following medications to your pharmacy for you to pick up at your convenience: Doxycycline   It was a pleasure to see you today!  Dr. Loletha Carrow

## 2018-06-15 NOTE — Progress Notes (Signed)
[facility] Consult Note:  History: [name] 06/15/2018  Referring physician: Everitt Amber, MD  Reason for consult/chief complaint: perianal bleeding ("dont know if its from pimple or what but its from above my rectum"; see blood in water; brb) and Melena (dark black stool; constipation; x several months; also notes occasional diarrhea; notes some gas/bloating issues as well)   Subjective  HPI:  This is a 58 year old woman referred by her gynecologist for reported rectal bleeding. [name] says that her stool is intermittently black, including a bowel movement she had after arrival to the office today.  She denies abdominal pain.  She has intermittent heartburn, denies dysphagia or vomiting. She is also bothered by a "pimple" on her sacrum.  She is not sure how long it is been there, sometimes it bleeds and hurts. I reviewed the recent note from her gynecologic oncologist, Dr. Denman George.  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  The patient has recently described rectal bleeding.  There is reference to a colonoscopy with diverticulosis in September 2014 in the [facility] system, though no formal report can be found. There is a surgical consult note from Dr. Wyvonna Plum in [place] dated 06/17/2017 regarding patient's coming umbilical hernia repair..  On a previous note in October 2018 he made mention of the patient having a colonoscopy a week prior to that at [facility].  There was reportedly sigmoid diverticulosis, a small polyp with "early adenomatous change".  Patient was also having heartburn and was advised to take ranitidine. EGD reportedly planned, no report in records sent to [place].  I reviewed her recent cardiology office note describing diastolic dysfunction, mild pulmonary hypertension, and normal LV function.  ROS:  Review of Systems  Constitutional: Positive for fatigue.  Negative for appetite change and unexpected weight change.  HENT: Negative for mouth sores and voice change.   Eyes: Negative for pain and redness.  Respiratory: Positive for shortness of breath. Negative for cough.   Cardiovascular: Negative for chest pain and palpitations.  Genitourinary: Negative for dysuria and hematuria.  Musculoskeletal: Positive for arthralgias and back pain. Negative for myalgias.  Skin: Negative for pallor and rash.  Neurological: Negative for weakness and headaches.  Hematological: Negative for adenopathy.     Past Medical History: Past Medical History:  Diagnosis Date  . Anemia due to antineoplastic chemotherapy 04/16/2015  . Anxiety   . Anxiety state 06/01/2007   Qualifier: Diagnosis of  By: Ronnald Ramp RN, [place], [place]    . Arthritis   . ARTHRITIS 06/01/2007   Qualifier: Diagnosis of  By: Ronnald Ramp RN, CGRN, Sheri    . Asthma   . Asthma exacerbation attacks 03/23/2015  . Candidiasis of vulva and vagina 04/10/2009   Qualifier: Diagnosis of  By: Tommy Medal MD, Roderic Scarce    . CELLULITIS AND ABSCESS OF LEG EXCEPT FOOT 03/24/2009   Qualifier: Diagnosis of  By: Tommy Medal MD, Roderic Scarce    . CHF (congestive heart failure) ([place])   . CONTACT DERMATITIS&OTHER ECZEMA DUE [place] CAUSE 04/10/2009   Qualifier: Diagnosis of  By: Tommy Medal MD, Roderic Scarce    . COPD (chronic obstructive pulmonary disease) ([place])   . Crohn's disease without complication ([place]) 16/01/9603  . CROHN'S DISEASE, SMALL INTESTINE 06/01/2007   Qualifier: Diagnosis of  By: Ronnald Ramp RN, CGRN, Sheri    . Depression   . Edema 03/24/2009   Qualifier: Diagnosis of  By: Tommy Medal MD, Roderic Scarce    .  Folliculitis 06/17/8717  . GASTROESOPHAGEAL REFLUX DISEASE 06/01/2007   Qualifier: Diagnosis of  By: Ronnald Ramp RN, CGRN, Sheri    . 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  The Custom gene panel offered by [facility] includes sequencing and rearrangement analysis for the following 24 genes:  ATM,  BAP1, BARD1, BRCA1, BRCA2, BRIP1, CDH1, CDK4, CDKN2A, CHEK2, EPCAM, FANCC, MITF, MLH1, MSH2, MSH6, NBN, PALB2, PMS2, PTEN, RAD51C, RAD51D, TP53, and XRCC2.   The report date is February 13, 2016.  Marland Kitchen GERD (gastroesophageal reflux disease)   . Heart murmur   . History of melanoma excision 03/23/2015  . Hypertension   . Hypokalemia 04/25/2015  . Insomnia   . Leukopenia due to antineoplastic chemotherapy ([place]) 04/16/2015  . MALIGNANT MELANOMA, HX OF 06/01/2007   Qualifier: History of  By: Ronnald Ramp RN, CGRN, Sheri    . Melanoma ([place])    rt. dorsal leg  . MORBID OBESITY 06/01/2007   Qualifier: Diagnosis of  By: Ronnald Ramp RN, CGRN, Sheri    . Morbid obesity with BMI of 50.0-59.9, adult ([place]) 03/23/2015  . OPEN WOUND OF KNEE LEG AND ANKLE COMPLICATED 59/74/7185   Qualifier: Diagnosis of  By: Patsy Baltimore RN, Langley Gauss    . Pyoderma gangrenosum   . RECTAL BLEEDING 10/15/2008   Qualifier: Diagnosis of  By: Olevia Perches MD, Lowella Bandy   . Seasonal allergies   . Shortness of breath   . Skin cancer 2003   melanoma  . Umbilical hernia without obstruction and without gangrene 03/23/2015  . Urinary, incontinence, stress female 10/27/2016     Past Surgical History: Past Surgical History:  Procedure Laterality Date  . ABDOMINAL HYSTERECTOMY Bilateral 02/11/2015   Procedure: HYSTERECTOMY ABDOMINAL, bilateral salpingo-oophorectomy;  Surgeon: Louretta Shorten, MD;  Location: [facility];  Service: Gynecology;  Laterality: Bilateral;  . COLONOSCOPY WITH ESOPHAGOGASTRODUODENOSCOPY (EGD)    . DILATATION & CURETTAGE/HYSTEROSCOPY WITH TRUECLEAR N/A 08/17/2013   Procedure: DILATATION & CURETTAGE/HYSTEROSCOPY WITH TRUCLEAR;  Surgeon: Luz Lex, MD;  Location: [facility];  Service: Gynecology;  Laterality: N/A;  . DILATION AND CURETTAGE OF UTERUS    . IR REMOVAL TUN ACCESS W/ PORT W/O FL MOD SED  07/05/2017  . MELANOMA EXCISION  06/2001   right leg; also removed 2 lymph nodes  . OMENTECTOMY N/A 02/11/2015   Procedure: OMENTECTOMY;  Surgeon: Louretta Shorten,  MD;  Location: [facility];  Service: Gynecology;  Laterality: N/A;  . UMBILICAL HERNIA REPAIR       Family History: Family History  Problem Relation Age of Onset  . Colon polyps Father   . Diabetes Father   . Heart disease Paternal Uncle   . Asthma Mother   . Melanoma Maternal Aunt   . Diabetes Paternal Grandmother   . Breast cancer Cousin        maternal first cousin  . Colon cancer Neg Hx     Social History: Social History   Socioeconomic History  . Marital status: Married    Spouse name: Not on file  . Number of children: 0  . Years of education: Not on file  . Highest education level: Not on file  Occupational History  . Occupation: disabled  Social Needs  . Financial resource strain: Not on file  . Food insecurity:    Worry: Not on file    Inability: Not on file  . Transportation needs:    Medical: Not on file    Non-medical: Not on file  Tobacco Use  .  Smoking status: Never Smoker  . Smokeless tobacco: Never Used  Substance and Sexual Activity  . Alcohol use: No  . Drug use: No  . Sexual activity: Not on file  Lifestyle  . Physical activity:    Days per week: Not on file    Minutes per session: Not on file  . Stress: Not on file  Relationships  . Social connections:    Talks on phone: Not on file    Gets together: Not on file    Attends religious service: Not on file    Active member of club or organization: Not on file    Attends meetings of clubs or organizations: Not on file    Relationship status: Not on file  Other Topics Concern  . Not on file  Social History Narrative  . Not on file    Allergies: Allergies  Allergen Reactions  . Levofloxacin Palpitations    REACTION: tachycardia  . Moxifloxacin Palpitations    REACTION: tachycardia but tolerated cirpofloxacin just fine  . Stadol [Butorphanol] Other (See Comments)    Caused shakes for 6 weeks "shaking for weeks"  . Amoxicillin Rash    Can take 500 mg - amounts greater causes yeast  infection REACTION: Rash with high doses. Can take lower doses like 574m  . Asa [Aspirin] Other (See Comments)    Pt stated the 81 mg caused Exacerbation of her asthma  - unsure about other asprins Asthma exacerbation  . Ciprofloxacin Hcl Nausea And Vomiting    Can take lower doses like 5084m Other causes yeast infection Can take lower doses like 50051m . Codeine Nausea Only and Nausea And Vomiting    REACTION: nausea  . Effexor [Venlafaxine] Other (See Comments)    hyperactivity Panic attacks  . Erythromycin Diarrhea and Nausea And Vomiting  . Latex Rash  . Losartan Potassium Nausea Only and Other (See Comments)    headache  . Paroxetine Other (See Comments)    Made nerves worse and caused fatigue  . Butorphanol Tartrate     Gave her the shakes for 6 weeks  . Cephalosporins     REACTION: she has tolerated rocephin and Keflex without difficulty  . Clindamycin/Lincomycin Other (See Comments)    Tears stomach up.  . Influenza Vaccines     Pt states that she got really sick from flu vaccine was sick a total of 6 weeks  . Losartan Other (See Comments)    Headache, nausea, fatigue  . Paxil [Paroxetine Hcl] Other (See Comments)    fatigue  . Clarithromycin Diarrhea  . Clindamycin Diarrhea  . Tape Rash    Outpatient Meds: Current Outpatient Medications  Medication Sig Dispense Refill  . albuterol (PROVENTIL HFA;VENTOLIN HFA) 108 (90 Base) MCG/ACT inhaler Inhale 2 puffs into the lungs every 4 (four) hours as needed for wheezing or shortness of breath. 1 Inhaler 2  . ALPRAZolam (XANAX) 1 MG tablet Take 1 mg by mouth at bedtime as needed for anxiety.    . aMarland KitchenLODipine (NORVASC) 2.5 MG tablet Take 2.5 mg by mouth 2 (two) times daily.     . Ascorbic Acid (VITAMIN C) 1000 MG tablet Take 1,000 mg by mouth daily.    . Cholecalciferol (VITAMIN D3) 5000 UNITS TABS Take 1 tablet by mouth daily. Reported on 06/26/2015    . famotidine (PEPCID) 20 MG tablet Take 20 mg by mouth 2 (two) times  daily.    . iMarland Kitchenuprofen (ADVIL,MOTRIN) 600 MG tablet Take 1 tablet (600 mg total)  by mouth daily as needed (mild pain). 20 tablet 0  . Lactobacillus (ACIDOPHILUS PO) Take 2 capsules by mouth daily.     . Melatonin 10 MG TABS Take by mouth.    . methocarbamol (ROBAXIN) 500 MG tablet TK 1 T PO Q 8 H PRF MUSCLE PAIN / RELAXATION  1  . montelukast (SINGULAIR) 10 MG tablet Take 10 mg by mouth at bedtime. Reported on 10/20/2015    . Nebulizers MISC 1 Units by Misc.(Non-Drug; Combo Route) route every 8 (eight) hours as needed.    . Omega-3 Fatty Acids (FISH OIL) 1000 MG CAPS Take 2 capsules by mouth daily. Reported on 06/26/2015    . sertraline (ZOLOFT) 50 MG tablet Take 50 mg by mouth daily.    . SYMBICORT 160-4.5 MCG/ACT inhaler INHALE 2 PUFFS BY MOUTH TWICE DAILY 10.2 g 1  . triamcinolone cream (KENALOG) 0.1 % Apply 1 application topically 2 (two) times daily as needed. Reported on 08/25/2015    . triamterene-hydrochlorothiazide (MAXZIDE) 75-50 MG per tablet Take 1 tablet by mouth daily. Reported on 06/12/2015    . vitamin E 400 UNIT capsule Take 400 Units by mouth daily. Reported on 06/26/2015    . doxycycline (VIBRAMYCIN) 50 MG capsule Take 2 capsules (100 mg total) by mouth 2 (two) times daily for 5 days. Stay upright at least 30 minutes after taking medicine with plenty of water 20 capsule 0   No current facility-administered medications for this visit.     Says she is on "blood thinner" since "blood clots" years ago, Dx at [facility] and prescribed by primary care since then.  She cannot recall the name of the medicine  ___________________________________________________________________ Objective   Exam:  BP 130/74   Pulse 68   Ht 5' 4"  (1.626 m)   Wt 237 lb 3.2 oz (107.6 kg)   LMP  (LMP Unknown)   BMI 40.72 kg/m  Entire exam chaperoned by Phillis Knack, CMA  General: Pleasant, conversational, no acute distress  Eyes: sclera anicteric, no redness  ENT: oral mucosa moist without lesions, no  cervical or supraclavicular lymphadenopathy  CV: RRR without murmur, S1/S2, no JVD, mild peripheral edema  Resp: clear to auscultation bilaterally, normal RR and effort noted  GI: soft, no tenderness, with active bowel sounds. No guarding or palpable organomegaly noted, but exam limited by morbid obesity  Skin; warm and dry, no rash or jaundice noted  Neuro: awake, alert and oriented x 3. Normal gross motor function and fluent speech.  Antalgic gait, uses cane   Small furuncle gluteal cleft, expresses scant purulent material, spot of blood, feels about a cm in diameter under skin/firm, mild tenderness, no surrounding erythema  Rectal: NST, no palpable internal lesions, firm light brown stool in rectal vault, heme negative  Labs:  CBC Latest Ref Rng & Units 07/05/2017 06/23/2016 03/22/2016  WBC 4.0 - 10.5 K/uL 5.3 5.0 5.1  Hemoglobin 12.0 - 15.0 g/dL 11.1(L) 11.5(L) 11.2(L)  Hematocrit 36.0 - 46.0 % 34.4(L) 35.5 35.2  Platelets 150 - 400 K/uL 243 202 233   No recent CBC  CMP Latest Ref Rng & Units 05/16/2018 07/05/2017 06/23/2016  Glucose 70 - 99 mg/dL 83 88 90  BUN 6 - 20 mg/dL 19 25(H) 30.6(H)  Creatinine 0.44 - 1.00 mg/dL 0.75 0.84 1.0  Sodium 135 - 145 mmol/L 136 139 138  Potassium 3.5 - 5.1 mmol/L 3.7 4.4 3.7  Chloride 98 - 111 mmol/L 103 103 -  CO2 22 - 32 mmol/L 27 26  28  Calcium 8.9 - 10.3 mg/dL 9.1 9.0 9.4  Total Protein 6.4 - 8.3 g/dL - - 8.6(H)  Total Bilirubin 0.20 - 1.20 mg/dL - - 0.55  Alkaline Phos 40 - 150 U/L - - 85  AST 5 - 34 U/L - - 26  ALT 0 - 55 U/L - - 26    Radiologic Studies:  Recent abdominal pelvic CT scan:  IMPRESSION: 1. No evidence of ovarian carcinoma recurrence or metastasis. 2. Small pelvic lymph nodes are not changed from comparison CT from 2018. 3. Mild LEFT colon diverticulosis without evidence diverticulitis    Colonoscopy to cecum by Dr. Olevia Perches 05/2011  - TI not intubated. No hemorrhoids mentioned.  Left -sided  diverticulosis  Assessment: Encounter Diagnoses  Name Primary?  . Change in stool color  Yes  . Furuncle of buttock     Small soft tissue lesion gluteal cleft, possibly small pilonidal cyst.  This appears to be the source of blood she sees when she wipes that area.  She does not have GI bleeding and does not need a colonoscopy at this point.  Insufficient records in the way of full colonoscopy and pathology report to know when patient is due for next routine colonoscopy for polyp surveillance based on current guidelines.  We will request records.  Plan:  Doxycycline 100 mg twice daily for 5 days for skin lesion.  Antibiotic choices are limited due to her extensive list of reported medication allergies and intolerances.  She was advised to stay upright at least 1/2-hour after taking these medication doses to decrease the chance of esophageal ulcer or exacerbation of GERD symptoms. I asked her to call her surgeon today for an appointment as soon as possible to evaluate the soft tissue lesion to see if it needs to be incised and drained or fully expressed.  Thank you for the courtesy of this consult.  Please call me with any questions or concerns. Total 60-minute time, extensive record review. Nelida Meuse III  CC: Referring provider noted above

## 2018-07-05 ENCOUNTER — Encounter: Payer: Self-pay | Admitting: Oncology

## 2018-08-22 ENCOUNTER — Other Ambulatory Visit: Payer: Self-pay | Admitting: Family Medicine

## 2018-08-22 DIAGNOSIS — Z13820 Encounter for screening for osteoporosis: Secondary | ICD-10-CM

## 2018-10-02 ENCOUNTER — Telehealth: Payer: Self-pay | Admitting: *Deleted

## 2018-10-02 NOTE — Telephone Encounter (Signed)
Patient called and scheduled her follow up lab/MD visit

## 2018-11-03 ENCOUNTER — Other Ambulatory Visit: Payer: Self-pay

## 2018-11-06 ENCOUNTER — Inpatient Hospital Stay: Payer: Medicare HMO | Attending: Gynecologic Oncology

## 2018-11-06 ENCOUNTER — Encounter: Payer: Self-pay | Admitting: Gynecologic Oncology

## 2018-11-06 ENCOUNTER — Inpatient Hospital Stay: Payer: Medicare HMO

## 2018-11-06 ENCOUNTER — Inpatient Hospital Stay (HOSPITAL_BASED_OUTPATIENT_CLINIC_OR_DEPARTMENT_OTHER): Payer: Medicare HMO | Admitting: Gynecologic Oncology

## 2018-11-06 ENCOUNTER — Other Ambulatory Visit: Payer: Self-pay

## 2018-11-06 VITALS — BP 144/70 | HR 72 | Temp 98.7°F | Resp 18 | Ht 64.0 in | Wt 250.0 lb

## 2018-11-06 DIAGNOSIS — Z08 Encounter for follow-up examination after completed treatment for malignant neoplasm: Secondary | ICD-10-CM | POA: Diagnosis present

## 2018-11-06 DIAGNOSIS — Z9221 Personal history of antineoplastic chemotherapy: Secondary | ICD-10-CM | POA: Diagnosis not present

## 2018-11-06 DIAGNOSIS — M810 Age-related osteoporosis without current pathological fracture: Secondary | ICD-10-CM | POA: Insufficient documentation

## 2018-11-06 DIAGNOSIS — C562 Malignant neoplasm of left ovary: Secondary | ICD-10-CM

## 2018-11-06 DIAGNOSIS — Z202 Contact with and (suspected) exposure to infections with a predominantly sexual mode of transmission: Secondary | ICD-10-CM

## 2018-11-06 DIAGNOSIS — Z90722 Acquired absence of ovaries, bilateral: Secondary | ICD-10-CM

## 2018-11-06 DIAGNOSIS — I110 Hypertensive heart disease with heart failure: Secondary | ICD-10-CM | POA: Diagnosis not present

## 2018-11-06 DIAGNOSIS — Z8543 Personal history of malignant neoplasm of ovary: Secondary | ICD-10-CM | POA: Diagnosis present

## 2018-11-06 DIAGNOSIS — Z6841 Body Mass Index (BMI) 40.0 and over, adult: Secondary | ICD-10-CM | POA: Diagnosis not present

## 2018-11-06 DIAGNOSIS — Z90710 Acquired absence of both cervix and uterus: Secondary | ICD-10-CM | POA: Insufficient documentation

## 2018-11-06 NOTE — Progress Notes (Signed)
Follow-up Note: Gyn-Onc  Consult was initially requested by Dr. Corinna Capra for the evaluation of [name] 58 y.o. 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  CC:  Chief Complaint  Patient presents with  . Ovarian Cancer    follow-up    Assessment/Plan:  [name]  is a 58 y.o.  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 Adjuvant chemotherapy from 03/26/15-05/29/15.  Complete clinical response of post-treatment imaging from 07/04/15.  She completed less than 3 full cycles of adjuvant chemotherapy with carboplatin and paclitaxel, due to extreme bone marrow toxicity.   She has no evidence of recurrence on exam. CA 125 pending, if normal, she will have her port removed.  Syphilis and HIV testing performed.   She will see me in 6 months and continue at this frequency until February, 2022.  HPI: [name] is a 58 year old woman who was seen in consultation at the request of Dr Corinna Capra for clear cell ovarian cancer.  The patient has a history of abnormal uterine bleeding/symptomatic fibroids. An US of the pelvis in September 2016 showed a 7cm complex cystic mass seen in the left ovary. A repeat US in October 2016 showed tha the mass had increased to approximately 10cm and was accompanied with normal blood flow and a normal CA 125 (10U/mL on 12/18/14).  She then underwent an ex lap (via Pfannenstiel) TAH, BSO and partial omentectomy on 02/11/15 with Dr Corinna Capra at [facility] in [place]. At the time of surgery, the large cystic mass was appreciated to be densely adherent to the pelvic sidewalls and in the process of removing it, unavoidable rupture occurred. Of note, pelvic washings taken from before the cyst rupture were negative for malignancy. According to the operative note there was complete resection of the cyst with no residual tumor adhesions or tissues in the pelvis and none identified in  the peritoneal cavity. The omentum was grossly normal, and at the time of frozen section revealing malignancy, a sample from the infracolic omentum was taken and was noted to be negative for malignancy.  Final pathology from the surgery revealed clear cell carcinoma of the left ovary. The uterus and contralateral tube and ovary were benign. The omentum was negative for malignancy.  She has done well postoperatively with no major morbidities.  The patient has major medical conditions of morbid obesity (BMI 54 kg/m2), crohn's disease, HTN, osteoporosis.  She was first seen by me on 02/24/2015 and at that consultation we recommended 6 cycles of adjuvant carboplatin and paclitaxel chemotherapy after a pretreatment baseline CT scan.  CT scan of the chest abdomen and pelvis on 03/07/2015 showed node apparent metastatic disease however it did demonstrate some signs of postoperative changes and fluid collections. She went on to commence adjuvant chemotherapy with Dr. Marko Plume with day 1 of cycle 1 dose dense, but the paclitaxel on 03/26/2015. She had multiple dose delays and reductions through the course of her treatment due to neutropenia thrombocytopenia and anemia. She required Granix bone marrow stimulation. Her final chemotherapy dose was day 1 of cycle 3 given on every 16th 2017. As the patient was poorly tolerating treatment the patient and her oncologist myometrial decision to discontinue treatment at that point in time.  A post therapeutic CT of the abdomen and pelvis on 07/04/2015 showed no apparent residual or recurrent GYN malignancy. Lab evaluations on 07/22/2015 showed normalization of her white blood cell count  4.4, hemoglobin was 9.8, and platelet count was 279.  CA 125 on 10/17/15 was normal at 8 and normal at 9.3 on 06/23/16. CT abdo/pelvis on 07/05/16 ordered for persistent left sided abdominal pain showed a "tiny" umbilical hernia but no evidence of recurrence and no apparent explanation for her  left sided abdominal pain. CA 125 on 9.2 on 02/16/17. CA 125 was normal at 8.8 on 05/18/17  She reported abdominal pains in 2019 and these were worked up with a CT scan of the abdomen and pelvis on 5 referral 2020 which revealed no evidence of ovarian carcinoma recurrence of metastases, stable size of pelvic lymph nodes unchanged from comparison CT from 2018.  Mild left colonic diverticulosis without diverticulitis.  She underwent hernia repair - uncomplicated.  Interval Hx:   CA 125 from 05/16/18 normal at 7.5  She reported abdominal pain in February, 2020 and therefore CT abd/pelvis was performed which revealed no evidence of ovarian cancer recurrence.  There was mild left colonic diverticulosis without diverticulitis present.  She reported a possible sexual exposure while not conscious a few years ago and is concerned about the possibility of syphilis or HIV being an occult infection.  She denies symptoms of these diseases.  She declined my office to assist in criminal investigation into her prior experiences and desired only STD testing.   CA 125 from 11/06/18 pending.   Current Meds:  Outpatient Encounter Medications as of 11/06/2018  Medication Sig  . albuterol (PROVENTIL HFA;VENTOLIN HFA) 108 (90 Base) MCG/ACT inhaler Inhale 2 puffs into the lungs every 4 (four) hours as needed for wheezing or shortness of breath.  . ALPRAZolam (XANAX) 1 MG tablet Take 1 mg by mouth at bedtime as needed for anxiety.  Marland Kitchen amLODipine (NORVASC) 2.5 MG tablet Take 2.5 mg by mouth 2 (two) times daily.   . Ascorbic Acid (VITAMIN C) 1000 MG tablet Take 1,000 mg by mouth daily.  . Cholecalciferol (VITAMIN D3) 5000 UNITS TABS Take 1 tablet by mouth daily. Reported on 06/26/2015  . famotidine (PEPCID) 20 MG tablet Take 20 mg by mouth 2 (two) times daily.  Marland Kitchen ibuprofen (ADVIL,MOTRIN) 600 MG tablet Take 1 tablet (600 mg total) by mouth daily as needed (mild pain).  . Lactobacillus (ACIDOPHILUS PO) Take 2 capsules by  mouth daily.   . Melatonin 10 MG TABS Take by mouth.  . methocarbamol (ROBAXIN) 500 MG tablet TK 1 T PO Q 8 H PRF MUSCLE PAIN / RELAXATION  . montelukast (SINGULAIR) 10 MG tablet Take 10 mg by mouth at bedtime. Reported on 10/20/2015  . Nebulizers MISC 1 Units by Misc.(Non-Drug; Combo Route) route every 8 (eight) hours as needed.  . Omega-3 Fatty Acids (FISH OIL) 1000 MG CAPS Take 2 capsules by mouth daily. Reported on 06/26/2015  . sertraline (ZOLOFT) 50 MG tablet Take 50 mg by mouth daily.  . SYMBICORT 160-4.5 MCG/ACT inhaler INHALE 2 PUFFS BY MOUTH TWICE DAILY  . triamcinolone cream (KENALOG) 0.1 % Apply 1 application topically 2 (two) times daily as needed. Reported on 08/25/2015  . triamterene-hydrochlorothiazide (MAXZIDE) 75-50 MG per tablet Take 1 tablet by mouth daily. Reported on 06/12/2015  . vitamin E 400 UNIT capsule Take 400 Units by mouth daily. Reported on 06/26/2015   No facility-administered encounter medications on file as of 11/06/2018.     Allergy:  Allergies  Allergen Reactions  . Levofloxacin Palpitations    REACTION: tachycardia  . Moxifloxacin Palpitations    REACTION: tachycardia but tolerated cirpofloxacin just fine  .  Stadol [Butorphanol] Other (See Comments)    Caused shakes for 6 weeks "shaking for weeks"  . Amoxicillin Rash    Can take 500 mg - amounts greater causes yeast infection REACTION: Rash with high doses. Can take lower doses like 521m  . Asa [Aspirin] Other (See Comments)    Pt stated the 81 mg caused Exacerbation of her asthma  - unsure about other asprins Asthma exacerbation  . Ciprofloxacin Hcl Nausea And Vomiting    Can take lower doses like 5065m Other causes yeast infection Can take lower doses like 50031m . Codeine Nausea Only and Nausea And Vomiting    REACTION: nausea  . Effexor [Venlafaxine] Other (See Comments)    hyperactivity Panic attacks  . Erythromycin Diarrhea and Nausea And Vomiting  . Latex Rash  . Losartan Potassium  Nausea Only and Other (See Comments)    headache  . Paroxetine Other (See Comments)    Made nerves worse and caused fatigue  . Butorphanol Tartrate     Gave her the shakes for 6 weeks  . Cephalosporins     REACTION: she has tolerated rocephin and Keflex without difficulty  . Clindamycin/Lincomycin Other (See Comments)    Tears stomach up.  . Influenza Vaccines     Pt states that she got really sick from flu vaccine was sick a total of 6 weeks  . Losartan Other (See Comments)    Headache, nausea, fatigue  . Paxil [Paroxetine Hcl] Other (See Comments)    fatigue  . Clarithromycin Diarrhea  . Clindamycin Diarrhea  . Tape Rash    Social Hx:   Social History   Socioeconomic History  . Marital status: Married    Spouse name: Not on file  . Number of children: 0  . Years of education: Not on file  . Highest education level: Not on file  Occupational History  . Occupation: disabled  Social Needs  . Financial resource strain: Not on file  . Food insecurity    Worry: Not on file    Inability: Not on file  . Transportation needs    Medical: Not on file    Non-medical: Not on file  Tobacco Use  . Smoking status: Never Smoker  . Smokeless tobacco: Never Used  Substance and Sexual Activity  . Alcohol use: No  . Drug use: No  . Sexual activity: Not on file  Lifestyle  . Physical activity    Days per week: Not on file    Minutes per session: Not on file  . Stress: Not on file  Relationships  . Social conHerbalist phone: Not on file    Gets together: Not on file    Attends religious service: Not on file    Active member of club or organization: Not on file    Attends meetings of clubs or organizations: Not on file    Relationship status: Not on file  . Intimate partner violence    Fear of current or ex partner: Not on file    Emotionally abused: Not on file    Physically abused: Not on file    Forced sexual activity: Not on file  Other Topics Concern  . Not  on file  Social History Narrative  . Not on file    Past Surgical Hx:  Past Surgical History:  Procedure Laterality Date  . ABDOMINAL HYSTERECTOMY Bilateral 02/11/2015   Procedure: HYSTERECTOMY ABDOMINAL, bilateral salpingo-oophorectomy;  Surgeon: DavLouretta ShortenD;  [facility];  Service: Gynecology;  Laterality: Bilateral;  . COLONOSCOPY WITH ESOPHAGOGASTRODUODENOSCOPY (EGD)    . DILATATION & CURETTAGE/HYSTEROSCOPY WITH TRUECLEAR N/A 08/17/2013   Procedure: DILATATION & CURETTAGE/HYSTEROSCOPY WITH TRUCLEAR;  Surgeon: Luz Lex, MD;  Location: [facility];  Service: Gynecology;  Laterality: N/A;  . DILATION AND CURETTAGE OF UTERUS    . IR REMOVAL TUN ACCESS W/ PORT W/O FL MOD SED  07/05/2017  . MELANOMA EXCISION  06/2001   right leg; also removed 2 lymph nodes  . OMENTECTOMY N/A 02/11/2015   Procedure: OMENTECTOMY;  Surgeon: Louretta Shorten, MD;  Location: [facility];  Service: Gynecology;  Laterality: N/A;  . UMBILICAL HERNIA REPAIR      Past Medical Hx:  Past Medical History:  Diagnosis Date  . Anemia due to antineoplastic chemotherapy 04/16/2015  . Anxiety   . Anxiety state 06/01/2007   Qualifier: Diagnosis of  By: Ronnald Ramp RN, [place], [place]    . Arthritis   . ARTHRITIS 06/01/2007   Qualifier: Diagnosis of  By: Ronnald Ramp RN, CGRN, Sheri    . Asthma   . Asthma exacerbation attacks 03/23/2015  . Candidiasis of vulva and vagina 04/10/2009   Qualifier: Diagnosis of  By: Tommy Medal MD, Roderic Scarce    . CELLULITIS AND ABSCESS OF LEG EXCEPT FOOT 03/24/2009   Qualifier: Diagnosis of  By: Tommy Medal MD, Roderic Scarce    . CHF (congestive heart failure) ([place])   . CONTACT DERMATITIS&OTHER ECZEMA DUE [place] CAUSE 04/10/2009   Qualifier: Diagnosis of  By: Tommy Medal MD, Roderic Scarce    . COPD (chronic obstructive pulmonary disease) ([place])   . Crohn's disease without complication ([place]) 16/60/6301  . CROHN'S DISEASE, SMALL INTESTINE 06/01/2007   Qualifier: Diagnosis of  By: Ronnald Ramp RN, CGRN, Sheri    . Depression   . Edema  03/24/2009   Qualifier: Diagnosis of  By: Tommy Medal MD, Roderic Scarce    . Folliculitis 6/0/1093  . GASTROESOPHAGEAL REFLUX DISEASE 06/01/2007   Qualifier: Diagnosis of  By: Ronnald Ramp RN, CGRN, Sheri    . 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  The Custom gene panel offered by [facility] includes sequencing and rearrangement analysis for the following 24 genes:  ATM, BAP1, BARD1, BRCA1, BRCA2, BRIP1, CDH1, CDK4, CDKN2A, CHEK2, EPCAM, FANCC, MITF, MLH1, MSH2, MSH6, NBN, PALB2, PMS2, PTEN, RAD51C, RAD51D, TP53, and XRCC2.   The report date is February 13, 2016.  Marland Kitchen GERD (gastroesophageal reflux disease)   . Heart murmur   . History of melanoma excision 03/23/2015  . Hypertension   . Hypokalemia 04/25/2015  . Insomnia   . Leukopenia due to antineoplastic chemotherapy ([place]) 04/16/2015  . MALIGNANT MELANOMA, HX OF 06/01/2007   Qualifier: History of  By: Ronnald Ramp RN, CGRN, Sheri    . Melanoma ([place])    rt. dorsal leg  . MORBID OBESITY 06/01/2007   Qualifier: Diagnosis of  By: Ronnald Ramp RN, CGRN, Sheri    . Morbid obesity with BMI of 50.0-59.9, adult ([place]) 03/23/2015  . OPEN WOUND OF KNEE LEG AND ANKLE COMPLICATED 23/55/7322   Qualifier: Diagnosis of  By: Patsy Baltimore RN, Langley Gauss    . Pyoderma gangrenosum   . RECTAL BLEEDING 10/15/2008   Qualifier: Diagnosis of  By: Olevia Perches MD, Lowella Bandy   . Seasonal allergies   . Shortness of breath   . Skin cancer 2003   melanoma  . Umbilical hernia without obstruction and without gangrene 03/23/2015  . Urinary, incontinence, stress female 10/27/2016  Past Gynecological History:  See HPI  No LMP recorded (lmp unknown). Patient has had a hysterectomy.  Family Hx:  Family History  Problem Relation Age of Onset  . Colon polyps Father   . Diabetes Father   . Heart disease Paternal Uncle   . Asthma Mother   . Melanoma Maternal Aunt   . Diabetes Paternal Grandmother   . Breast cancer Cousin        maternal first cousin  . Colon cancer Neg  Hx     Review of Systems:  Constitutional  Feels well,    ENT Normal appearing ears and nares bilaterally Skin/Breast  No rash, sores, jaundice, itching, dryness Cardiovascular  + SOB and edema Pulmonary  +cough  Gastro Intestinal  No nausea, vomitting, or diarrhoea. + bright red blood per rectum, no abdominal pain, change in bowel movement, or constipation.  Genito Urinary  No frequency, urgency, dysuria, see HPI + stress urinary incontinence Musculo Skeletal  No myalgia, arthralgia, joint swelling or pain  Neurologic  No weakness, numbness, change in gait,  Psychology  No depression, anxiety, insomnia.   Vitals:  Blood pressure (!) 144/70, pulse 72, temperature 98.7 F (37.1 C), temperature source Oral, resp. rate 18, height 5' 4"  (1.626 m), weight 250 lb (113.4 kg), SpO2 98 %.  Physical Exam: WD in NAD Neck  Supple NROM, without any enlargements.  Lymph Node Survey No cervical supraclavicular or inguinal adenopathy Cardiovascular  Pulse normal rate, regularity and rhythm. S1 and S2 normal.  Lungs  Clear to auscultation bilateraly, without wheezes/crackles/rhonchi. Good air movement.  Skin  No rash/lesions/breakdown  Psychiatry  Alert and oriented to person, place, and time  Abdomen  Normoactive bowel sounds, abdomen soft, non-tender and obese. Well healed low transverse incision.  Hernia repaired.  Back No CVA tenderness Genito Urinary  Vulva/vagina: Normal external female genitalia.  No lesions. No discharge or bleeding.  Bladder/urethra:  No lesions or masses, well supported bladder  Vagina: grossly normal with in tact cuff.  Cervix: surgically absent  Uterus: surgically absent    Adnexa: no palpable masses. Rectal  Good tone, no masses no cul de sac nodularity.  Extremities  No bilateral cyanosis, clubbing or edema.   Thereasa Solo, MD  11/06/2018, 5:04 PM

## 2018-11-06 NOTE — Patient Instructions (Signed)
BP today: 128/76 Weight today: 250lbs  BP February: 133/80 Weight February: 243lbs   Dr Serita Grit office will contact you with results of your blood work when available.  Please notify Dr Denman George at phone number [phone] if you notice vaginal bleeding, new pelvic or abdominal pains, bloating, feeling full easy, or a change in bladder or bowel function.   Please contact Dr Serita Grit office (at [phone]) in October, 2020 to request an appointment with her for January, 2021.

## 2018-11-07 ENCOUNTER — Telehealth: Payer: Self-pay

## 2018-11-07 ENCOUNTER — Telehealth: Payer: Self-pay | Admitting: *Deleted

## 2018-11-07 LAB — RPR: RPR Ser Ql: NONREACTIVE

## 2018-11-07 LAB — HIV ANTIBODY (ROUTINE TESTING W REFLEX): HIV Screen 4th Generation wRfx: NONREACTIVE

## 2018-11-07 LAB — CA 125: Cancer Antigen (CA) 125: 7.2 U/mL (ref 0.0–38.1)

## 2018-11-07 NOTE — Telephone Encounter (Signed)
Patient called and left message asking for a referral to ENT per appt yesterday. Left message for [facility]

## 2018-11-07 NOTE — Telephone Encounter (Signed)
-----   Message from Dorothyann Gibbs, NP sent at 11/07/2018  9:05 AM EDT ----- Please let her know WNL and stable

## 2018-11-07 NOTE — Telephone Encounter (Signed)
Kaylee Gibbs, NP  Baruch Merl, RN        Please let her know HIV and syphilis negative

## 2018-11-07 NOTE — Telephone Encounter (Signed)
CAlled and left the patient a message with the name and number for Dr. Lucia Gaskins

## 2018-11-07 NOTE — Telephone Encounter (Signed)
LM in patient's voice mail with the results of the CA-125 as noted below by Joylene John, NP.

## 2018-11-07 NOTE — Telephone Encounter (Signed)
LM  For patient stating that her other lab work was came back negative.  If she has any questions or concerns, she can call the office.

## 2018-11-08 ENCOUNTER — Ambulatory Visit: Payer: Medicare HMO | Admitting: Family Medicine

## 2018-11-10 ENCOUNTER — Other Ambulatory Visit: Payer: Self-pay

## 2018-11-10 ENCOUNTER — Encounter: Payer: Self-pay | Admitting: Family Medicine

## 2018-11-10 ENCOUNTER — Ambulatory Visit (INDEPENDENT_AMBULATORY_CARE_PROVIDER_SITE_OTHER): Payer: Medicare HMO | Admitting: Family Medicine

## 2018-11-10 VITALS — BP 122/72 | HR 91 | Temp 98.5°F | Ht 64.0 in | Wt 244.4 lb

## 2018-11-10 DIAGNOSIS — J4540 Moderate persistent asthma, uncomplicated: Secondary | ICD-10-CM

## 2018-11-10 DIAGNOSIS — Z0000 Encounter for general adult medical examination without abnormal findings: Secondary | ICD-10-CM

## 2018-11-10 DIAGNOSIS — F411 Generalized anxiety disorder: Secondary | ICD-10-CM

## 2018-11-10 DIAGNOSIS — H938X2 Other specified disorders of left ear: Secondary | ICD-10-CM

## 2018-11-10 DIAGNOSIS — Z23 Encounter for immunization: Secondary | ICD-10-CM | POA: Diagnosis not present

## 2018-11-10 DIAGNOSIS — Z114 Encounter for screening for human immunodeficiency virus [HIV]: Secondary | ICD-10-CM

## 2018-11-10 DIAGNOSIS — K5090 Crohn's disease, unspecified, without complications: Secondary | ICD-10-CM

## 2018-11-10 DIAGNOSIS — I10 Essential (primary) hypertension: Secondary | ICD-10-CM

## 2018-11-10 LAB — URINALYSIS, ROUTINE W REFLEX MICROSCOPIC
Bilirubin Urine: NEGATIVE
Hgb urine dipstick: NEGATIVE
Ketones, ur: NEGATIVE
Leukocytes,Ua: NEGATIVE
Nitrite: NEGATIVE
Specific Gravity, Urine: 1.015 (ref 1.000–1.030)
Total Protein, Urine: NEGATIVE
Urine Glucose: NEGATIVE
Urobilinogen, UA: 0.2 (ref 0.0–1.0)
WBC, UA: NONE SEEN — AB (ref 0–?)
pH: 7.5 (ref 5.0–8.0)

## 2018-11-10 LAB — CBC
HCT: 36 % (ref 36.0–46.0)
Hemoglobin: 11.8 g/dL — ABNORMAL LOW (ref 12.0–15.0)
MCHC: 32.9 g/dL (ref 30.0–36.0)
MCV: 86.4 fl (ref 78.0–100.0)
Platelets: 259 10*3/uL (ref 150.0–400.0)
RBC: 4.16 Mil/uL (ref 3.87–5.11)
RDW: 15.1 % (ref 11.5–15.5)
WBC: 4.3 10*3/uL (ref 4.0–10.5)

## 2018-11-10 LAB — COMPREHENSIVE METABOLIC PANEL
ALT: 19 U/L (ref 0–35)
AST: 25 U/L (ref 0–37)
Albumin: 3.9 g/dL (ref 3.5–5.2)
Alkaline Phosphatase: 71 U/L (ref 39–117)
BUN: 23 mg/dL (ref 6–23)
CO2: 27 mEq/L (ref 19–32)
Calcium: 9.8 mg/dL (ref 8.4–10.5)
Chloride: 100 mEq/L (ref 96–112)
Creatinine, Ser: 0.84 mg/dL (ref 0.40–1.20)
GFR: 69.56 mL/min (ref >60.00)
Glucose, Bld: 83 mg/dL (ref 70–99)
Potassium: 3.9 mEq/L (ref 3.5–5.1)
Sodium: 135 mEq/L (ref 135–145)
Total Bilirubin: 0.7 mg/dL (ref 0.2–1.2)
Total Protein: 9.3 g/dL — ABNORMAL HIGH (ref 6.0–8.3)

## 2018-11-10 LAB — VITAMIN D 25 HYDROXY (VIT D DEFICIENCY, FRACTURES): VITD: 48.67 ng/mL (ref 30.00–100.00)

## 2018-11-10 LAB — LIPID PANEL
Cholesterol: 157 mg/dL (ref 0–200)
HDL: 54.2 mg/dL (ref >39.00)
LDL Cholesterol: 93 mg/dL (ref 0–99)
NonHDL: 102.82
Total CHOL/HDL Ratio: 3
Triglycerides: 48 mg/dL (ref 0.0–149.0)
VLDL: 9.6 mg/dL (ref 0.0–40.0)

## 2018-11-10 MED ORDER — ALBUTEROL SULFATE HFA 108 (90 BASE) MCG/ACT IN AERS: 1.0000 | INHALATION_SPRAY | Freq: Four times a day (QID) | RESPIRATORY_TRACT | 2 refills | Status: DC | PRN

## 2018-11-10 MED ORDER — TRIAMTERENE-HCTZ 75-50 MG PO TABS: 1.0000 | ORAL_TABLET | Freq: Every day | ORAL | 2 refills | Status: DC

## 2018-11-10 MED ORDER — SERTRALINE HCL 50 MG PO TABS: 50.0000 mg | ORAL_TABLET | Freq: Every day | ORAL | 2 refills | Status: DC

## 2018-11-10 MED ORDER — AMLODIPINE BESYLATE 2.5 MG PO TABS: 2.5000 mg | ORAL_TABLET | Freq: Two times a day (BID) | ORAL | 2 refills | Status: DC

## 2018-11-10 MED ORDER — BUDESONIDE-FORMOTEROL FUMARATE 160-4.5 MCG/ACT IN AERO: 2.0000 | INHALATION_SPRAY | Freq: Two times a day (BID) | RESPIRATORY_TRACT | 5 refills | Status: DC

## 2018-11-10 MED ORDER — MONTELUKAST SODIUM 10 MG PO TABS: 10.0000 mg | ORAL_TABLET | Freq: Every day | ORAL | 2 refills | Status: DC

## 2018-11-10 NOTE — Progress Notes (Signed)
94

## 2018-11-10 NOTE — Progress Notes (Signed)
Chief Complaint  Patient presents with  . New Patient (Initial Visit)    establish care       New Patient Visit SUBJECTIVE: HPI: [name] is an 58 y.o.female who is being seen for establishing care.  The patient was previously seen at [facility].  Hypertension Patient presents for hypertension follow up. She does not monitor home blood pressures. She is compliant with medications. Patient has these side effects of medication: none She is usually adhering to a healthy diet overall. Exercise: walking  Hx of asthma. On Symbicort and Montelukast. Takes Ventolin rarely. Has had PCV23. No SOB. Requesting to be under care of pulmonologist through [facility].   Hx of L ear fullness for several months. No inj or change in activity. Denies pain. Requesting to see ENT. Denies fevers, nasal congestion, itchy eyes, ST, cough.  Hx of GAD. Takes Zoloft 50 mg/d. Takes Xanax daily as needed. Usually uses on average of 10/month she thinks. Does not follow w psych/counseling. No SI or HI.   Has a hx of Crohn's disease.   Allergies  Allergen Reactions  . Levofloxacin Palpitations    REACTION: tachycardia  . Moxifloxacin Palpitations    REACTION: tachycardia but tolerated cirpofloxacin just fine  . Stadol [Butorphanol] Other (See Comments)    Caused shakes for 6 weeks "shaking for weeks"  . Amoxicillin Rash    Can take 500 mg - amounts greater causes yeast infection REACTION: Rash with high doses. Can take lower doses like 536m  . Asa [Aspirin] Other (See Comments)    Pt stated the 81 mg caused Exacerbation of her asthma  - unsure about other asprins Asthma exacerbation  . Ciprofloxacin Hcl Nausea And Vomiting    Can take lower doses like 5043m Other causes yeast infection Can take lower doses like 5004m . Codeine Nausea Only and Nausea And Vomiting    REACTION: nausea  . Effexor [Venlafaxine] Other (See Comments)    hyperactivity Panic attacks  . Erythromycin Diarrhea and Nausea And  Vomiting  . Latex Rash  . Losartan Potassium Nausea Only and Other (See Comments)    headache  . Paroxetine Other (See Comments)    Made nerves worse and caused fatigue  . Butorphanol Tartrate     Gave her the shakes for 6 weeks  . Cephalosporins     REACTION: she has tolerated rocephin and Keflex without difficulty  . Clindamycin/Lincomycin Other (See Comments)    Tears stomach up.  . Influenza Vaccines     Pt states that she got really sick from flu vaccine was sick a total of 6 weeks  . Losartan Other (See Comments)    Headache, nausea, fatigue  . Paxil [Paroxetine Hcl] Other (See Comments)    fatigue  . Clarithromycin Diarrhea  . Clindamycin Diarrhea  . Tape Rash    Past Medical History:  Diagnosis Date  . Anemia due to antineoplastic chemotherapy 04/16/2015  . Anxiety state 06/01/2007   Qualifier: Diagnosis of  By: JonRonnald Ramp, CGRGalenaheStaunton . Arthritis   . ARTHRITIS 06/01/2007   Qualifier: Diagnosis of  By: JonRonnald Ramp, CGRN, Sheri    . Asthma   . Candidiasis of vulva and vagina 04/10/2009   Qualifier: Diagnosis of  By: VanTommy Medal, CorRoderic Scarce . CELLULITIS AND ABSCESS OF LEG EXCEPT FOOT 03/24/2009   Qualifier: Diagnosis of  By: VanTommy Medal, CorRoderic Scarce . CHF (congestive heart failure) ([place] . CONTACT DERMATITIS&OTHER  ECZEMA DUE [place] CAUSE 04/10/2009   Qualifier: Diagnosis of  By: Tommy Medal MD, Roderic Scarce    . COPD (chronic obstructive pulmonary disease) ([place])   . Crohn's disease without complication ([place]) 60/45/4098  . CROHN'S DISEASE, SMALL INTESTINE 06/01/2007   Qualifier: Diagnosis of  By: Ronnald Ramp RN, CGRN, Sheri    . Depression   . 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  The Custom gene panel offered by [facility] includes sequencing and rearrangement analysis for the following 24 genes:  ATM, BAP1, BARD1, BRCA1, BRCA2, BRIP1, CDH1, CDK4, CDKN2A, CHEK2, EPCAM, FANCC, MITF, MLH1, MSH2, MSH6, NBN, PALB2, PMS2, PTEN, RAD51C,  RAD51D, TP53, and XRCC2.   The report date is February 13, 2016.  Marland Kitchen GERD (gastroesophageal reflux disease)   . Heart murmur   . History of melanoma excision 03/23/2015  . Hypertension   . Leukopenia due to antineoplastic chemotherapy ([place]) 04/16/2015  . Melanoma ([place])    rt. dorsal leg  . MORBID OBESITY 06/01/2007   Qualifier: Diagnosis of  By: Ronnald Ramp RN, CGRN, Sheri    . Morbid obesity with BMI of 50.0-59.9, adult ([place]) 03/23/2015  . OPEN WOUND OF KNEE LEG AND ANKLE COMPLICATED 11/91/4782   Qualifier: Diagnosis of  By: Patsy Baltimore RN, Langley Gauss    . RECTAL BLEEDING 10/15/2008   Qualifier: Diagnosis of  By: Olevia Perches MD, Lowella Bandy   . Seasonal allergies   . Skin cancer 2003   melanoma  . Umbilical hernia without obstruction and without gangrene 03/23/2015  . Urinary, incontinence, stress female 10/27/2016   Past Surgical History:  Procedure Laterality Date  . ABDOMINAL HYSTERECTOMY Bilateral 02/11/2015   Procedure: HYSTERECTOMY ABDOMINAL, bilateral salpingo-oophorectomy;  Surgeon: Louretta Shorten, MD;  Location: [facility];  Service: Gynecology;  Laterality: Bilateral;  . COLONOSCOPY WITH ESOPHAGOGASTRODUODENOSCOPY (EGD)    . DILATATION & CURETTAGE/HYSTEROSCOPY WITH TRUECLEAR N/A 08/17/2013   Procedure: DILATATION & CURETTAGE/HYSTEROSCOPY WITH TRUCLEAR;  Surgeon: Luz Lex, MD;  Location: [facility];  Service: Gynecology;  Laterality: N/A;  . IR REMOVAL TUN ACCESS W/ PORT W/O FL MOD SED  07/05/2017  . MELANOMA EXCISION  06/2001   right leg; also removed 2 lymph nodes  . OMENTECTOMY N/A 02/11/2015   Procedure: OMENTECTOMY;  Surgeon: Louretta Shorten, MD;  Location: [facility];  Service: Gynecology;  Laterality: N/A;  . UMBILICAL HERNIA REPAIR     Family History  Problem Relation Age of Onset  . Colon polyps Father   . Diabetes Father   . Heart disease Paternal Uncle   . Asthma Mother   . Melanoma Maternal Aunt   . Diabetes Paternal Grandmother   . Breast cancer Cousin        maternal first cousin  . Colon cancer Neg  Hx    Allergies  Allergen Reactions  . Levofloxacin Palpitations    REACTION: tachycardia  . Moxifloxacin Palpitations    REACTION: tachycardia but tolerated cirpofloxacin just fine  . Stadol [Butorphanol] Other (See Comments)    Caused shakes for 6 weeks "shaking for weeks"  . Amoxicillin Rash    Can take 500 mg - amounts greater causes yeast infection REACTION: Rash with high doses. Can take lower doses like 541m  . Asa [Aspirin] Other (See Comments)    Pt stated the 81 mg caused Exacerbation of her asthma  - unsure about other asprins Asthma exacerbation  . Ciprofloxacin Hcl Nausea And Vomiting    Can take lower doses like 5058m Other causes yeast infection  Can take lower doses like 566m.  . Codeine Nausea Only and Nausea And Vomiting    REACTION: nausea  . Effexor [Venlafaxine] Other (See Comments)    hyperactivity Panic attacks  . Erythromycin Diarrhea and Nausea And Vomiting  . Latex Rash  . Losartan Potassium Nausea Only and Other (See Comments)    headache  . Paroxetine Other (See Comments)    Made nerves worse and caused fatigue  . Butorphanol Tartrate     Gave her the shakes for 6 weeks  . Cephalosporins     REACTION: she has tolerated rocephin and Keflex without difficulty  . Clindamycin/Lincomycin Other (See Comments)    Tears stomach up.  . Influenza Vaccines     Pt states that she got really sick from flu vaccine was sick a total of 6 weeks  . Losartan Other (See Comments)    Headache, nausea, fatigue  . Paxil [Paroxetine Hcl] Other (See Comments)    fatigue  . Clarithromycin Diarrhea  . Clindamycin Diarrhea  . Tape Rash    Current Outpatient Medications:  .  ALPRAZolam (XANAX) 1 MG tablet, Take 1 mg by mouth at bedtime as needed for anxiety., Disp: , Rfl:  .  amLODipine (NORVASC) 2.5 MG tablet, Take 1 tablet (2.5 mg total) by mouth 2 (two) times daily., Disp: 180 tablet, Rfl: 2 .  Ascorbic Acid (VITAMIN C) 1000 MG tablet, Take 1,000 mg by mouth  daily., Disp: , Rfl:  .  budesonide-formoterol (SYMBICORT) 160-4.5 MCG/ACT inhaler, Inhale 2 puffs into the lungs 2 (two) times daily., Disp: 10.2 g, Rfl: 5 .  Cholecalciferol (VITAMIN D3) 5000 UNITS TABS, Take 1 tablet by mouth daily. Reported on 06/26/2015, Disp: , Rfl:  .  ELDERBERRY PO, Take 1,250 mg by mouth daily., Disp: , Rfl:  .  famotidine (PEPCID) 20 MG tablet, Take 20 mg by mouth 2 (two) times daily., Disp: , Rfl:  .  Lactobacillus (ACIDOPHILUS PO), Take 2 capsules by mouth daily. , Disp: , Rfl:  .  methocarbamol (ROBAXIN) 500 MG tablet, TK 1 T PO Q 8 H PRF MUSCLE PAIN / RELAXATION, Disp: , Rfl: 1 .  montelukast (SINGULAIR) 10 MG tablet, Take 1 tablet (10 mg total) by mouth at bedtime. Reported on 10/20/2015, Disp: 90 tablet, Rfl: 2 .  Nebulizers MISC, 1 Units by Misc.(Non-Drug; Combo Route) route every 8 (eight) hours as needed., Disp: , Rfl:  .  Omega-3 Fatty Acids (FISH OIL) 1000 MG CAPS, Take 2 capsules by mouth daily. Reported on 06/26/2015, Disp: , Rfl:  .  promethazine (PHENERGAN) 25 MG tablet, Take 25 mg by mouth every 6 (six) hours as needed for nausea or vomiting., Disp: , Rfl:  .  sertraline (ZOLOFT) 50 MG tablet, Take 1 tablet (50 mg total) by mouth daily., Disp: 90 tablet, Rfl: 2 .  triamterene-hydrochlorothiazide (MAXZIDE) 75-50 MG tablet, Take 1 tablet by mouth daily. Reported on 06/12/2015, Disp: 90 tablet, Rfl: 2 .  vitamin E 400 UNIT capsule, Take 400 Units by mouth daily. Reported on 06/26/2015, Disp: , Rfl:  .  albuterol (VENTOLIN HFA) 108 (90 Base) MCG/ACT inhaler, Inhale 1-2 puffs into the lungs every 6 (six) hours as needed for wheezing or shortness of breath., Disp: 18 g, Rfl: 2  ROS Const: no fevers Skin: No rash Lungs: No SOB Throat: No ST Ears: +L sided fullness Nose: No congestion Eyes: No itchy eyes GI: No rectal bleeding Endo: No unexplained wt loss Psych: +anxiety  OBJECTIVE: BP 122/72 (BP Location: Left  Arm, Patient Position: Sitting, Cuff Size:  Large)   Pulse 91   Temp 98.5 F (36.9 C) (Oral)   Ht _0  (1.626 m)   Wt 244 lb 6 oz (110.8 kg)   LMP  (LMP Unknown)   SpO2 94%   BMI 41.95 kg/m   Constitutional: -  VS reviewed -  Well developed, well nourished, appears stated age -  No apparent distress  Psychiatric: -  Oriented to person, place, and time -  Memory intact -  Affect and mood normal -  Fluent conversation, good eye contact -  Judgment and insight age appropriate  Eye: -  Conjunctivae clear, no discharge -  Pupils symmetric, round, reactive to light  ENMT: -  Ears neg b/l -  Nares patent, no d/c  -  MMM    Pharynx moist, no exudate, no erythema  Neck: -  No gross swelling, no palpable masses -  Thyroid midline, not enlarged, mobile, no palpable masses  Cardiovascular: -  RRR -  No bruits -  No pitting LE edema  Respiratory: -  Normal respiratory effort, no accessory muscle use, no retraction -  Breath sounds equal, no wheezes, no ronchi, no crackles  Gastrointestinal: -  Bowel sounds normal -  No tenderness, no distention, no guarding, no masses  Musculoskeletal: -  No clubbing, no cyanosis -  Gait slow and with cane  Skin: -  No significant lesion on inspection -  Warm and dry to palpation   ASSESSMENT/PLAN: Moderate persistent asthma without complication - Plan: budesonide-formoterol (SYMBICORT) 160-4.5 MCG/ACT inhaler, Ambulatory referral to Pulmonology  Sensation of fullness in left ear - Plan: Ambulatory referral to ENT  GAD (generalized anxiety disorder) - Plan: Cont Zoloft, will adjust if she is taking too much Xanax, >10/mo  Essential hypertension - Plan: Cont meds  Well adult exam - Plan: CBC, Comprehensive metabolic panel, Lipid panel, Urinalysis, Routine w reflex microscopic, Vitamin D (25 hydroxy), ck labs, well visit in 1 mo  Screening for HIV (human immunodeficiency virus) - Plan: HIV Antibody (routine testing w rflx)  Need for tetanus booster - Plan: Tdap vaccine greater than or  equal to 7yo IM  Morbid obesity ([place]) - Plan: counseled on diet and exercise  Crohn's disease without complication, unspecified gastrointestinal tract location [facility]) - will ensure she is set up with GI  Patient should return in 1 mo for CPE. The patient voiced understanding and agreement to the plan.   [place], DO 11/10/18  3:29 PM

## 2018-11-10 NOTE — Patient Instructions (Signed)
Give [place] 2-3 business days to get the results of your labs back.   Keep the diet clean and stay active.  If you do not hear anything about your referral in the next 1-2 weeks, call our office and ask for an update.  Healthy Eating Plan Many factors influence your heart health, including eating and exercise habits. Heart (coronary) risk increases with abnormal blood fat (lipid) levels. Heart-healthy meal planning includes limiting unhealthy fats, increasing healthy fats, and making other small dietary changes. This includes maintaining a healthy body weight to help keep lipid levels within a normal range.  WHAT IS MY PLAN?  Your health care provider recommends that you:  Drink a glass of water before meals to help with satiety.  Eat slowly.  An alternative to the water is to add Metamucil. This will help with satiety as well. It does contain calories, unlike water.  WHAT TYPES OF FAT SHOULD I CHOOSE?  Choose healthy fats more often. Choose monounsaturated and polyunsaturated fats, such as olive oil and canola oil, flaxseeds, walnuts, almonds, and seeds.  Eat more omega-3 fats. Good choices include salmon, mackerel, sardines, tuna, flaxseed oil, and ground flaxseeds. Aim to eat fish at least two times each week.  Avoid foods with partially hydrogenated oils in them. These contain trans fats. Examples of foods that contain trans fats are stick margarine, some tub margarines, cookies, crackers, and other baked goods. If you are going to avoid a fat, this is the one to avoid!  WHAT GENERAL GUIDELINES DO I NEED TO FOLLOW?  Check food labels carefully to identify foods with trans fats. Avoid these types of options when possible.  Fill one half of your plate with vegetables and green salads. Eat 4-5 servings of vegetables per day. A serving of vegetables equals 1 cup of raw leafy vegetables,  cup of raw or cooked cut-up vegetables, or  cup of vegetable juice.  Fill one fourth of your plate  with whole grains. Look for the word "whole" as the first word in the ingredient list.  Fill one fourth of your plate with lean protein foods.  Eat 4-5 servings of fruit per day. A serving of fruit equals one medium whole fruit,  cup of dried fruit,  cup of fresh, frozen, or canned fruit. Try to avoid fruits in cups/syrups as the sugar content can be high.  Eat more foods that contain soluble fiber. Examples of foods that contain this type of fiber are apples, broccoli, carrots, beans, peas, and barley. Aim to get 20-30 g of fiber per day.  Eat more home-cooked food and less restaurant, buffet, and fast food.  Limit or avoid alcohol.  Limit foods that are high in starch and sugar.  Avoid fried foods when able.  Cook foods by using methods other than frying. Baking, boiling, grilling, and broiling are all great options. Other fat-reducing suggestions include: ? Removing the skin from poultry. ? Removing all visible fats from meats. ? Skimming the fat off of stews, soups, and gravies before serving them. ? Steaming vegetables in water or broth.  Lose weight if you are overweight. Losing just 5-10% of your initial body weight can help your overall health and prevent diseases such as diabetes and heart disease.  Increase your consumption of nuts, legumes, and seeds to 4-5 servings per week. One serving of dried beans or legumes equals  cup after being cooked, one serving of nuts equals 1 ounces, and one serving of seeds equals  ounce or  1 tablespoon.  WHAT ARE GOOD FOODS CAN I EAT? Grains Grainy breads (try to find bread that is 3 g of fiber per slice or greater), oatmeal, light popcorn. Whole-grain cereals. Rice and pasta, including brown rice and those that are made with whole wheat. Edamame pasta is a great alternative to grain pasta. It has a higher protein content. Try to avoid significant consumption of white bread, sugary cereals, or pastries/baked goods.  Vegetables All  vegetables. Cooked white potatoes do not count as vegetables.  Fruits All fruits, but limit pineapple and bananas as these fruits have a higher sugar content.  Meats and Other Protein Sources Lean, well-trimmed beef, veal, pork, and lamb. Chicken and [place] without skin. All fish and shellfish. Wild duck, rabbit, pheasant, and venison. Egg whites or low-cholesterol egg substitutes. Dried beans, peas, lentils, and tofu.Seeds and most nuts.  Dairy Low-fat or nonfat cheeses, including ricotta, string, and mozzarella. Skim or 1% milk that is liquid, powdered, or evaporated. Buttermilk that is made with low-fat milk. Nonfat or low-fat yogurt. Soy/Almond milk are good alternatives if you cannot handle dairy.  Beverages Water is the best for you. Sports drinks with less sugar are more desirable unless you are a highly active athlete.  Sweets and Desserts Sherbets and fruit ices. Honey, jam, marmalade, jelly, and syrups. Dark chocolate.  Eat all sweets and desserts in moderation.  Fats and Oils Nonhydrogenated (trans-free) margarines. Vegetable oils, including soybean, sesame, sunflower, olive, peanut, safflower, corn, canola, and cottonseed. Salad dressings or mayonnaise that are made with a vegetable oil. Limit added fats and oils that you use for cooking, baking, salads, and as spreads.  Other Cocoa powder. Coffee and tea. Most condiments.  The items listed above may not be a complete list of recommended foods or beverages. Contact your dietitian for more options.

## 2018-11-11 LAB — HIV ANTIBODY (ROUTINE TESTING W REFLEX): HIV 1&2 Ab, 4th Generation: NONREACTIVE

## 2018-11-13 ENCOUNTER — Telehealth: Payer: Self-pay | Admitting: Family Medicine

## 2018-11-13 ENCOUNTER — Other Ambulatory Visit: Payer: Self-pay | Admitting: Family Medicine

## 2018-11-13 MED ORDER — MONTELUKAST SODIUM 10 MG PO TABS: 10.0000 mg | ORAL_TABLET | Freq: Every day | ORAL | 2 refills | Status: DC

## 2018-11-13 MED ORDER — TRIAMTERENE-HCTZ 75-50 MG PO TABS: 1.0000 | ORAL_TABLET | Freq: Every day | ORAL | 2 refills | Status: DC

## 2018-11-13 MED ORDER — AMLODIPINE BESYLATE 2.5 MG PO TABS: 2.5000 mg | ORAL_TABLET | Freq: Two times a day (BID) | ORAL | 2 refills | Status: DC

## 2018-11-13 MED ORDER — SERTRALINE HCL 50 MG PO TABS: 50.0000 mg | ORAL_TABLET | Freq: Every day | ORAL | 2 refills | Status: DC

## 2018-11-13 MED ORDER — ALPRAZOLAM 1 MG PO TABS: 1.0000 mg | ORAL_TABLET | Freq: Every evening | ORAL | 0 refills | Status: DC | PRN

## 2018-11-13 NOTE — Telephone Encounter (Signed)
Copied from [place] [id number]. Topic: Quick Communication - Rx Refill/Question >> Nov 13, 2018  4:37 PM Nils Flack wrote: Medication: amlodipine, sertraline, triamterene-hydrochlorothiazide (MAXZIDE) 75-50 MG tablet and montelukast (SINGULAIR) 10 MG tablet  Has the patient contacted their pharmacy? Yes.   (Agent: If no, request that the patient contact the pharmacy for the refill.) (Agent: If yes, when and what did the pharmacy advise?)  Preferred Pharmacy (with phone number or street name): all these need to go to [facility]   Agent: Please be advised that RX refills may take up to 3 business days. We ask that you follow-up with your pharmacy.

## 2018-11-13 NOTE — Telephone Encounter (Signed)
Called informed sent in.

## 2018-11-13 NOTE — Telephone Encounter (Signed)
Copied from [place] [id number]. Topic: Quick Communication - Rx Refill/Question >> Nov 13, 2018  4:40 PM Nils Flack wrote: Medication: xanax 1 mg  Has the patient contacted their pharmacy? No. (Agent: If no, request that the patient contact the pharmacy for the refill.) (Agent: If yes, when and what did the pharmacy advise?)  Preferred Pharmacy (with phone number or street name): [facility] [place]   Agent: Please be advised that RX refills may take up to 3 business days. We ask that you follow-up with your pharmacy.

## 2018-11-14 ENCOUNTER — Other Ambulatory Visit: Payer: Self-pay | Admitting: Family Medicine

## 2018-11-14 ENCOUNTER — Telehealth: Payer: Self-pay | Admitting: Family Medicine

## 2018-11-14 DIAGNOSIS — E8809 Other disorders of plasma-protein metabolism, not elsewhere classified: Secondary | ICD-10-CM

## 2018-11-14 DIAGNOSIS — H938X2 Other specified disorders of left ear: Secondary | ICD-10-CM

## 2018-11-14 DIAGNOSIS — R779 Abnormality of plasma protein, unspecified: Secondary | ICD-10-CM

## 2018-11-14 DIAGNOSIS — D649 Anemia, unspecified: Secondary | ICD-10-CM

## 2018-11-14 NOTE — Telephone Encounter (Signed)
See results notes. 

## 2018-11-14 NOTE — Progress Notes (Signed)
spep

## 2018-11-16 ENCOUNTER — Other Ambulatory Visit: Payer: Medicare HMO

## 2018-11-20 ENCOUNTER — Telehealth: Payer: Self-pay | Admitting: Family Medicine

## 2018-11-20 DIAGNOSIS — H938X2 Other specified disorders of left ear: Secondary | ICD-10-CM

## 2018-11-20 NOTE — Telephone Encounter (Signed)
Called her back and gave her the number to [facility] which she has called already. She now wants a referral to ENT DR. Benjamine Mola. Will put in referral.

## 2018-11-24 ENCOUNTER — Other Ambulatory Visit: Payer: Medicare HMO

## 2018-11-27 ENCOUNTER — Telehealth: Payer: Self-pay | Admitting: Family Medicine

## 2018-11-27 ENCOUNTER — Other Ambulatory Visit: Payer: Medicare HMO

## 2018-11-27 DIAGNOSIS — C569 Malignant neoplasm of unspecified ovary: Secondary | ICD-10-CM | POA: Diagnosis not present

## 2018-11-27 DIAGNOSIS — E669 Obesity, unspecified: Secondary | ICD-10-CM | POA: Diagnosis not present

## 2018-11-27 DIAGNOSIS — D631 Anemia in chronic kidney disease: Secondary | ICD-10-CM | POA: Diagnosis not present

## 2018-11-27 DIAGNOSIS — N189 Chronic kidney disease, unspecified: Secondary | ICD-10-CM | POA: Diagnosis not present

## 2018-11-27 DIAGNOSIS — N182 Chronic kidney disease, stage 2 (mild): Secondary | ICD-10-CM | POA: Diagnosis not present

## 2018-11-27 DIAGNOSIS — N390 Urinary tract infection, site not specified: Secondary | ICD-10-CM | POA: Diagnosis not present

## 2018-11-27 DIAGNOSIS — I129 Hypertensive chronic kidney disease with stage 1 through stage 4 chronic kidney disease, or unspecified chronic kidney disease: Secondary | ICD-10-CM | POA: Diagnosis not present

## 2018-11-27 NOTE — Telephone Encounter (Signed)
Mailed lab results

## 2018-11-27 NOTE — Telephone Encounter (Signed)
Mailed lab results.

## 2018-11-27 NOTE — Telephone Encounter (Signed)
Patient is calling to receive the last lab results mailed to her. Patient declined access to MyChart.   Patient is canceling lab appt today. She had lab work done in [place] and will have lab results faxed.  Thank you

## 2018-11-29 ENCOUNTER — Other Ambulatory Visit: Payer: Self-pay | Admitting: Family Medicine

## 2018-11-29 MED ORDER — FAMOTIDINE 20 MG PO TABS: 20.0000 mg | ORAL_TABLET | Freq: Two times a day (BID) | ORAL | 0 refills | Status: DC

## 2018-11-30 ENCOUNTER — Ambulatory Visit: Payer: Medicare HMO | Admitting: Pulmonary Disease

## 2018-11-30 ENCOUNTER — Encounter: Payer: Self-pay | Admitting: Pulmonary Disease

## 2018-11-30 ENCOUNTER — Telehealth: Payer: Self-pay

## 2018-11-30 ENCOUNTER — Other Ambulatory Visit: Payer: Self-pay

## 2018-11-30 VITALS — BP 138/74 | HR 84 | Temp 98.3°F | Ht 64.0 in | Wt 249.2 lb

## 2018-11-30 DIAGNOSIS — J45909 Unspecified asthma, uncomplicated: Secondary | ICD-10-CM | POA: Diagnosis not present

## 2018-11-30 DIAGNOSIS — R05 Cough: Secondary | ICD-10-CM | POA: Diagnosis not present

## 2018-11-30 DIAGNOSIS — R059 Cough, unspecified: Secondary | ICD-10-CM

## 2018-11-30 LAB — CBC WITH DIFFERENTIAL/PLATELET
Basophils Absolute: 0 10*3/uL (ref 0.0–0.1)
Basophils Relative: 1 % (ref 0.0–3.0)
Eosinophils Absolute: 0 10*3/uL (ref 0.0–0.7)
Eosinophils Relative: 0.9 % (ref 0.0–5.0)
HCT: 35.8 % — ABNORMAL LOW (ref 36.0–46.0)
Hemoglobin: 11.7 g/dL — ABNORMAL LOW (ref 12.0–15.0)
Lymphocytes Relative: 15.6 % (ref 12.0–46.0)
Lymphs Abs: 0.8 10*3/uL (ref 0.7–4.0)
MCHC: 32.6 g/dL (ref 30.0–36.0)
MCV: 87.6 fl (ref 78.0–100.0)
Monocytes Absolute: 0.4 10*3/uL (ref 0.1–1.0)
Monocytes Relative: 7.8 % (ref 3.0–12.0)
Neutro Abs: 3.7 10*3/uL (ref 1.4–7.7)
Neutrophils Relative %: 74.7 % (ref 43.0–77.0)
Platelets: 261 10*3/uL (ref 150.0–400.0)
RBC: 4.09 Mil/uL (ref 3.87–5.11)
RDW: 15.2 % (ref 11.5–15.5)
WBC: 4.9 10*3/uL (ref 4.0–10.5)

## 2018-11-30 NOTE — Patient Instructions (Signed)
Thank you for your visit to the [facility]. It was a pleasure meeting with you. Please schedule an appointment following your pulmonary function testing with Dr. Vaughan Browner preferably the day of your testing to review the tests.  For now, given your stability we recommend continuing your current therapy with Symbicort, Singular and Albuterol.  We will order IgE's, alpha-1-antitrypsin, CBC with differential and full PFT's to better assess the degree and nature of you lung illness.

## 2018-11-30 NOTE — Progress Notes (Addendum)
New Patient Office Visit  Subjective:  Patient ID: [name], female    DOB: 03/07/1961  Age: 58 y.o. MRN: [id number]  CC:  Chief Complaint  Patient presents with  . Consult    asthma - wants to transfer care to [place]    HPI TRANISE [name] presents for evaluation and management of asthma. She has a PMH notable for HFpEF, HTN, MDD, obesity, melanoma s/p resection. She has continued on her albuterol inhaler 2-3 times per week, Singular intermittently (uncertain this helps), and Symbicort daily as prescribed. She feels that the Symbicort is of the most benefit to her. She dndorsed mildly increased cough, sputum production, thick white/clear, and increased dyspnea over the past 3 years. She previously followed with Dr. Annamaria Boots and is transferring her care back to [facility]. She stated that she likely had PFT's completed 3-4 years prior by Dr. Harriette Bouillon at [facility] but did not recall the results.   Mother deceased from asthma complications. Never a smoker. Denied EtOH or illicit drug use. 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 Could not recall the last time this occurred.  Does not have pets at home.  Past Medical History:  Diagnosis Date  . Anemia due to antineoplastic chemotherapy 04/16/2015  . Anxiety state 06/01/2007   Qualifier: Diagnosis of  By: Ronnald Ramp RN, [place], [place]    . Asthma   . CHF (congestive heart failure) ([place])   . COPD (chronic obstructive pulmonary disease) ([place])   . Crohn's disease without complication ([place]) 48/25/0037  . Depression   . 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  The Custom gene panel offered by [facility] includes sequencing and rearrangement analysis for the following 24 genes:  ATM, BAP1, BARD1, BRCA1, BRCA2, BRIP1, CDH1,  CDK4, CDKN2A, CHEK2, EPCAM, FANCC, MITF, MLH1, MSH2, MSH6, NBN, PALB2, PMS2, PTEN, RAD51C, RAD51D, TP53, and XRCC2.   The report date is February 13, 2016.  Marland Kitchen GERD (gastroesophageal reflux disease)   . Heart murmur   . History of melanoma excision 03/23/2015  . Hypertension   . Leukopenia due to antineoplastic chemotherapy ([place]) 04/16/2015  . Melanoma ([place])    rt. dorsal leg  . MORBID OBESITY 06/01/2007   Qualifier: Diagnosis of  By: Ronnald Ramp RN, CGRN, [place]    . Morbid obesity with BMI of 50.0-59.9, adult ([place]) 03/23/2015  . OPEN WOUND OF KNEE LEG AND ANKLE COMPLICATED 04/88/8916   Qualifier: Diagnosis of  By: Patsy Baltimore RN, Langley Gauss    . Seasonal allergies   . Skin cancer 2003   melanoma  . Umbilical hernia without obstruction and without gangrene 03/23/2015  . Urinary, incontinence, stress female 10/27/2016    Past Surgical History:  Procedure Laterality Date  . ABDOMINAL HYSTERECTOMY Bilateral 02/11/2015   Procedure: HYSTERECTOMY ABDOMINAL, bilateral salpingo-oophorectomy;  Surgeon: Louretta Shorten, MD;  Location: [facility];  Service: Gynecology;  Laterality: Bilateral;  . COLONOSCOPY WITH ESOPHAGOGASTRODUODENOSCOPY (EGD)    . DILATATION & CURETTAGE/HYSTEROSCOPY WITH TRUECLEAR N/A 08/17/2013   Procedure: DILATATION & CURETTAGE/HYSTEROSCOPY WITH TRUCLEAR;  Surgeon: Luz Lex, MD;  Location: [facility];  Service: Gynecology;  Laterality: N/A;  . IR REMOVAL TUN ACCESS W/ PORT W/O FL MOD SED  07/05/2017  . MELANOMA EXCISION  06/2001   right leg; also removed 2 lymph nodes  .  OMENTECTOMY N/A 02/11/2015   Procedure: OMENTECTOMY;  Surgeon: Louretta Shorten, MD;  Location: [facility];  Service: Gynecology;  Laterality: N/A;  . UMBILICAL HERNIA REPAIR      Family History  Problem Relation Age of Onset  . Colon polyps Father   . Diabetes Father   . Heart disease Paternal Uncle   . Asthma Mother   . Melanoma Maternal Aunt   . Diabetes Paternal Grandmother   . Breast cancer Cousin        maternal first cousin  .  Colon cancer Neg Hx     Social History   Socioeconomic History  . Marital status: Married    Spouse name: Not on file  . Number of children: 0  . Years of education: Not on file  . Highest education level: Not on file  Occupational History  . Occupation: disabled  Social Needs  . Financial resource strain: Not on file  . Food insecurity    Worry: Not on file    Inability: Not on file  . Transportation needs    Medical: Not on file    Non-medical: Not on file  Tobacco Use  . Smoking status: Never Smoker  . Smokeless tobacco: Never Used  Substance and Sexual Activity  . Alcohol use: No  . Drug use: No  . Sexual activity: Not on file  Lifestyle  . Physical activity    Days per week: Not on file    Minutes per session: Not on file  . Stress: Not on file  Relationships  . Social Herbalist on phone: Not on file    Gets together: Not on file    Attends religious service: Not on file    Active member of club or organization: Not on file    Attends meetings of clubs or organizations: Not on file    Relationship status: Not on file  . Intimate partner violence    Fear of current or ex partner: Not on file    Emotionally abused: Not on file    Physically abused: Not on file    Forced sexual activity: Not on file  Other Topics Concern  . Not on file  Social History Narrative  . Not on file    ROS Review of Systems  Constitutional: Negative for activity change, appetite change and fatigue.  HENT: Negative for congestion, dental problem, sinus pressure and sinus pain.   Eyes: Negative for discharge and redness.  Respiratory: Positive for cough, shortness of breath and wheezing. Negative for apnea, choking and chest tightness.   Cardiovascular: Negative for chest pain and palpitations.  Gastrointestinal: Negative for abdominal distention and abdominal pain.  Endocrine: Negative for polyuria.  Genitourinary: Negative for difficulty urinating and dysuria.   Musculoskeletal: Negative for neck pain and neck stiffness.  Skin: Negative for color change and rash.  Neurological: Negative for dizziness and headaches.  Psychiatric/Behavioral: Negative for confusion. The patient is not nervous/anxious.     Objective:   Today's Vitals: BP 138/74 (BP Location: Right Arm, Patient Position: Sitting, Cuff Size: Normal)   Pulse 84   Temp 98.3 F (36.8 C)   Ht 5' 4"  (1.626 m)   Wt 249 lb 3.2 oz (113 kg)   LMP  (LMP Unknown)   SpO2 98%   BMI 42.78 kg/m   Physical Exam Vitals signs reviewed.  Constitutional:      General: She is not in acute distress.    Appearance: She is well-developed. She is  not diaphoretic.  HENT:     Head: Normocephalic and atraumatic.  Eyes:     Conjunctiva/sclera: Conjunctivae normal.  Neck:     Musculoskeletal: Normal range of motion and neck supple.  Cardiovascular:     Rate and Rhythm: Normal rate and regular rhythm.     Heart sounds: No murmur.  Pulmonary:     Effort: Pulmonary effort is normal. No respiratory distress.     Breath sounds: No stridor. Wheezing present.  Abdominal:     General: Bowel sounds are normal. There is no distension.     Palpations: Abdomen is soft.     Tenderness: There is no abdominal tenderness.  Musculoskeletal:        General: No tenderness.  Skin:    General: Skin is warm.     Capillary Refill: Capillary refill takes less than 2 seconds.  Neurological:     Mental Status: She is alert and oriented to person, place, and time.     Assessment & Plan:   Problem List Items Addressed This Visit      Respiratory   Asthma - Primary    Given the patients rather stable symptoms we will continue her current therapy and follow-up on her lab results prior to making further adjustments. Given her lack of clear exposure history to tobacco or other similar environmental risk factor for COPD it is unclear if this is playing a role. We will better evaluate for reversibility and current  functional status with full PFT's. Additionally we will determine her risk for inheritable factors possibly exacerbating her pulmonary disease given her family history of such.  Plan: Full PFT's Alpha-1, IgE antibody, CBC w/ diff F/up with Dr. Vaughan Browner after the PFT's      Relevant Orders   CBC with Differential/Platelet   Alpha-1 antitrypsin phenotype   IgE   Pulmonary Function Test     Other   Cough    Chronic. Worsening over the past several years. She had a history of GERD and Asthma which could explain this.  Agree with Pepcid for GERD Will follow-up with PFT's and labs prior to making adjustments to her Asthma therapy.       Relevant Orders   Pulmonary Function Test      Outpatient Encounter Medications as of 11/30/2018  Medication Sig  . albuterol (VENTOLIN HFA) 108 (90 Base) MCG/ACT inhaler Inhale 1-2 puffs into the lungs every 6 (six) hours as needed for wheezing or shortness of breath.  . ALPRAZolam (XANAX) 1 MG tablet Take 1 tablet (1 mg total) by mouth at bedtime as needed for anxiety.  Marland Kitchen amLODipine (NORVASC) 2.5 MG tablet Take 1 tablet (2.5 mg total) by mouth 2 (two) times daily.  . Ascorbic Acid (VITAMIN C) 1000 MG tablet Take 1,000 mg by mouth daily.  . budesonide-formoterol (SYMBICORT) 160-4.5 MCG/ACT inhaler Inhale 2 puffs into the lungs 2 (two) times daily.  . Cholecalciferol (VITAMIN D3) 5000 UNITS TABS Take 1 tablet by mouth daily. Reported on 06/26/2015  . ELDERBERRY PO Take 1,250 mg by mouth daily.  . famotidine (PEPCID) 20 MG tablet Take 1 tablet (20 mg total) by mouth 2 (two) times daily.  . Lactobacillus (ACIDOPHILUS PO) Take 2 capsules by mouth daily.   . methocarbamol (ROBAXIN) 500 MG tablet TK 1 T PO Q 8 H PRF MUSCLE PAIN / RELAXATION  . montelukast (SINGULAIR) 10 MG tablet Take 1 tablet (10 mg total) by mouth at bedtime. Reported on 10/20/2015  . Nebulizers MISC 1 Units  by Misc.(Non-Drug; Combo Route) route every 8 (eight) hours as needed.  . Omega-3  Fatty Acids (FISH OIL) 1000 MG CAPS Take 2 capsules by mouth daily. Reported on 06/26/2015  . promethazine (PHENERGAN) 25 MG tablet Take 25 mg by mouth every 6 (six) hours as needed for nausea or vomiting.  . sertraline (ZOLOFT) 50 MG tablet Take 1 tablet (50 mg total) by mouth daily.  Marland Kitchen triamterene-hydrochlorothiazide (MAXZIDE) 75-50 MG tablet Take 1 tablet by mouth daily. Reported on 06/12/2015  . vitamin E 400 UNIT capsule Take 400 Units by mouth daily. Reported on 06/26/2015   No facility-administered encounter medications on file as of 11/30/2018.     Follow-up: No follow-ups on file.   Kathi Ludwig, MD   Attending note: I have seen and examined the patient. History, labs and imaging reviewed. Agree with assessment and plan as noted above.  58 year old with COPD transferring care to lobar pulmonary Symptoms are stable on Symbicort.  Does not need to use her rescue inhaler very often.  No nocturnal awakenings. We will check PFTs, labs including CBC differential, IgE and alpha-1 antitrypsin for baseline assessment  Marshell Garfinkel MD [facility] 12/01/2018, 1:12 PM

## 2018-11-30 NOTE — Assessment & Plan Note (Addendum)
Given the patients rather stable symptoms we will continue her current therapy and follow-up on her lab results prior to making further adjustments. Given her lack of clear exposure history to tobacco or other similar environmental risk factor for COPD it is unclear if this is playing a role. We will better evaluate for reversibility and current functional status with full PFT's. Additionally we will determine her risk for inheritable factors possibly exacerbating her pulmonary disease given her family history of such. ACQ score was 2 today as she did not have any recent PFT's on file.   Plan: Full PFT's Alpha-1, IgE antibody, CBC w/ diff F/up with Dr. Vaughan Browner after the PFT's

## 2018-11-30 NOTE — Assessment & Plan Note (Signed)
Chronic. Worsening over the past several years. She had a history of GERD and Asthma which could explain this.  Agree with Pepcid for GERD Will follow-up with PFT's and labs prior to making adjustments to her Asthma therapy.

## 2018-11-30 NOTE — Telephone Encounter (Signed)
Please schedule PFT and OV to follow with Dr. Vaughan Browner as soon as available.

## 2018-12-06 DIAGNOSIS — M26622 Arthralgia of left temporomandibular joint: Secondary | ICD-10-CM | POA: Diagnosis not present

## 2018-12-06 DIAGNOSIS — H9202 Otalgia, left ear: Secondary | ICD-10-CM | POA: Diagnosis not present

## 2018-12-07 LAB — IGE: IgE (Immunoglobulin E), Serum: 33 kU/L (ref <=114)

## 2018-12-07 LAB — ALPHA-1 ANTITRYPSIN PHENOTYPE: A-1 Antitrypsin, Ser: 98 mg/dL (ref 83–199)

## 2018-12-07 NOTE — Telephone Encounter (Signed)
Patient is returning phone call.  Patient phone number is [phone].

## 2018-12-08 NOTE — Telephone Encounter (Signed)
Called and spoke to pt - he would like to talk to her family about the test - pt concerned about doing the covid testing - pt will call back to schedule -pr

## 2018-12-13 ENCOUNTER — Ambulatory Visit (INDEPENDENT_AMBULATORY_CARE_PROVIDER_SITE_OTHER): Payer: Medicare HMO | Admitting: Family Medicine

## 2018-12-13 ENCOUNTER — Encounter: Payer: Self-pay | Admitting: Family Medicine

## 2018-12-13 ENCOUNTER — Other Ambulatory Visit: Payer: Self-pay

## 2018-12-13 VITALS — BP 120/76 | HR 83 | Temp 97.7°F | Ht 64.0 in | Wt 248.0 lb

## 2018-12-13 DIAGNOSIS — Z0000 Encounter for general adult medical examination without abnormal findings: Secondary | ICD-10-CM | POA: Diagnosis not present

## 2018-12-13 DIAGNOSIS — Z6841 Body Mass Index (BMI) 40.0 and over, adult: Secondary | ICD-10-CM | POA: Diagnosis not present

## 2018-12-13 DIAGNOSIS — J449 Chronic obstructive pulmonary disease, unspecified: Secondary | ICD-10-CM | POA: Diagnosis not present

## 2018-12-13 MED ORDER — ALPRAZOLAM 1 MG PO TABS: 1.0000 mg | ORAL_TABLET | Freq: Every evening | ORAL | 1 refills | Status: DC | PRN

## 2018-12-13 MED ORDER — APIXABAN 5 MG PO TABS: 5.0000 mg | ORAL_TABLET | Freq: Two times a day (BID) | ORAL | 5 refills | Status: DC

## 2018-12-13 NOTE — Patient Instructions (Addendum)
Give [place] 2-3 business days to get the results of your labs back.   Keep the diet clean and stay active. Consider yoga if able.   I recommend getting the flu shot in mid October. This suggestion would change if the CDC comes out with a different recommendation.   Let us know if you need anything.

## 2018-12-13 NOTE — Progress Notes (Signed)
Chief Complaint  Patient presents with  . Annual Exam     Well Woman [name] is here for a complete physical.   Her last physical was >1 year ago.  Current diet: in general, tries to watch her diet. Current exercise: walking. Weight is stable and she denies daytime fatigue. No LMP recorded (lmp unknown). Patient has had a hysterectomy. Seatbelt? Yes  Health Maintenance Mammogram- Yes  Tetanus- Yes Hep C screening- Yes HIV screening- Yes  Past Medical History:  Diagnosis Date  . Anemia due to antineoplastic chemotherapy 04/16/2015  . Anxiety state 06/01/2007   Qualifier: Diagnosis of  By: Ronnald Ramp RN, [place], [place]    . Asthma   . CHF (congestive heart failure) ([place])   . COPD (chronic obstructive pulmonary disease) ([place])   . Crohn's disease without complication ([place]) 58/48/5462  . Depression   . 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  The Custom gene panel offered by [facility] includes sequencing and rearrangement analysis for the following 24 genes:  ATM, BAP1, BARD1, BRCA1, BRCA2, BRIP1, CDH1, CDK4, CDKN2A, CHEK2, EPCAM, FANCC, MITF, MLH1, MSH2, MSH6, NBN, PALB2, PMS2, PTEN, RAD51C, RAD51D, TP53, and XRCC2.   The report date is February 13, 2016.  Marland Kitchen GERD (gastroesophageal reflux disease)   . Heart murmur   . History of melanoma excision 03/23/2015  . Hypertension   . Leukopenia due to antineoplastic chemotherapy ([place]) 04/16/2015  . Melanoma ([place])    rt. dorsal leg  . MORBID OBESITY 06/01/2007   Qualifier: Diagnosis of  By: Ronnald Ramp RN, CGRN, [place]    . Morbid obesity with BMI of 50.0-59.9, adult ([place]) 03/23/2015  . OPEN WOUND OF KNEE LEG AND ANKLE COMPLICATED 58/35/0093   Qualifier: Diagnosis of  By: Patsy Baltimore RN, Langley Gauss    . Seasonal allergies   . Skin cancer 2003   melanoma  . Umbilical hernia without obstruction and without gangrene 03/23/2015  . Urinary, incontinence, stress female 10/27/2016     Past Surgical History:   Procedure Laterality Date  . ABDOMINAL HYSTERECTOMY Bilateral 02/11/2015   Procedure: HYSTERECTOMY ABDOMINAL, bilateral salpingo-oophorectomy;  Surgeon: Louretta Shorten, MD;  Location: [facility];  Service: Gynecology;  Laterality: Bilateral;  . COLONOSCOPY WITH ESOPHAGOGASTRODUODENOSCOPY (EGD)    . DILATATION & CURETTAGE/HYSTEROSCOPY WITH TRUECLEAR N/A 08/17/2013   Procedure: DILATATION & CURETTAGE/HYSTEROSCOPY WITH TRUCLEAR;  Surgeon: Luz Lex, MD;  Location: [facility];  Service: Gynecology;  Laterality: N/A;  . IR REMOVAL TUN ACCESS W/ PORT W/O FL MOD SED  07/05/2017  . MELANOMA EXCISION  06/2001   right leg; also removed 2 lymph nodes  . OMENTECTOMY N/A 02/11/2015   Procedure: OMENTECTOMY;  Surgeon: Louretta Shorten, MD;  Location: [facility];  Service: Gynecology;  Laterality: N/A;  . UMBILICAL HERNIA REPAIR      Medications  Current Outpatient Medications on File Prior to Visit  Medication Sig Dispense Refill  . albuterol (VENTOLIN HFA) 108 (90 Base) MCG/ACT inhaler Inhale 1-2 puffs into the lungs every 6 (six) hours as needed for wheezing or shortness of breath. 18 g 2  . ALPRAZolam (XANAX) 1 MG tablet Take 1 tablet (1 mg total) by mouth at bedtime as needed for anxiety. 30 tablet 0  . amLODipine (NORVASC) 2.5 MG tablet Take 1 tablet (2.5 mg total) by mouth 2 (two) times daily. 180 tablet 2  . Ascorbic Acid (VITAMIN C) 1000 MG tablet Take 1,000 mg by mouth daily.    . budesonide-formoterol (SYMBICORT) 160-4.5  MCG/ACT inhaler Inhale 2 puffs into the lungs 2 (two) times daily. 10.2 g 5  . Cholecalciferol (VITAMIN D3) 5000 UNITS TABS Take 1 tablet by mouth daily. Reported on 06/26/2015    . ELDERBERRY PO Take 1,250 mg by mouth daily.    . famotidine (PEPCID) 20 MG tablet Take 1 tablet (20 mg total) by mouth 2 (two) times daily. 180 tablet 0  . Lactobacillus (ACIDOPHILUS PO) Take 2 capsules by mouth daily.     . methocarbamol (ROBAXIN) 500 MG tablet TK 1 T PO Q 8 H PRF MUSCLE PAIN / RELAXATION  1  .  montelukast (SINGULAIR) 10 MG tablet Take 1 tablet (10 mg total) by mouth at bedtime. Reported on 10/20/2015 90 tablet 2  . Nebulizers MISC 1 Units by Misc.(Non-Drug; Combo Route) route every 8 (eight) hours as needed.    . Omega-3 Fatty Acids (FISH OIL) 1000 MG CAPS Take 2 capsules by mouth daily. Reported on 06/26/2015    . promethazine (PHENERGAN) 25 MG tablet Take 25 mg by mouth every 6 (six) hours as needed for nausea or vomiting.    . sertraline (ZOLOFT) 50 MG tablet Take 1 tablet (50 mg total) by mouth daily. 90 tablet 2  . triamterene-hydrochlorothiazide (MAXZIDE) 75-50 MG tablet Take 1 tablet by mouth daily. Reported on 06/12/2015 90 tablet 2  . vitamin E 400 UNIT capsule Take 400 Units by mouth daily. Reported on 06/26/2015     Allergies Allergies  Allergen Reactions  . Levofloxacin Palpitations    REACTION: tachycardia  . Moxifloxacin Palpitations    REACTION: tachycardia but tolerated cirpofloxacin just fine  . Stadol [Butorphanol] Other (See Comments)    Caused shakes for 6 weeks "shaking for weeks"  . Amoxicillin Rash    Can take 500 mg - amounts greater causes yeast infection REACTION: Rash with high doses. Can take lower doses like 537m  . Asa [Aspirin] Other (See Comments)    Pt stated the 81 mg caused Exacerbation of her asthma  - unsure about other asprins Asthma exacerbation  . Ciprofloxacin Hcl Nausea And Vomiting    Can take lower doses like 5027m Other causes yeast infection Can take lower doses like 50038m . Codeine Nausea Only and Nausea And Vomiting    REACTION: nausea  . Effexor [Venlafaxine] Other (See Comments)    hyperactivity Panic attacks  . Erythromycin Diarrhea and Nausea And Vomiting  . Latex Rash  . Losartan Potassium Nausea Only and Other (See Comments)    headache  . Paroxetine Other (See Comments)    Made nerves worse and caused fatigue  . Butorphanol Tartrate     Gave her the shakes for 6 weeks  . Cephalosporins     REACTION: she has  tolerated rocephin and Keflex without difficulty  . Clindamycin/Lincomycin Other (See Comments)    Tears stomach up.  . Influenza Vaccines     Pt states that she got really sick from flu vaccine was sick a total of 6 weeks  . Losartan Other (See Comments)    Headache, nausea, fatigue  . Paxil [Paroxetine Hcl] Other (See Comments)    fatigue  . Clarithromycin Diarrhea  . Clindamycin Diarrhea  . Tape Rash    Review of Systems: Constitutional:  no unexpected weight changes Eye:  no recent significant change in vision Ear/Nose/Mouth/Throat:  Ears:  no tinnitus or vertigo and no recent change in hearing Nose/Mouth/Throat:  no complaints of nasal congestion, no sore throat Cardiovascular: no chest pain Respiratory:  no cough and no shortness of breath Gastrointestinal:  no abdominal pain, no change in bowel habits GU:  Female: negative for dysuria or pelvic pain Musculoskeletal/Extremities:  No new pain of the joints Integumentary (Skin/Breast):  no abnormal skin lesions of concern today (follows derm) Neurologic:  no headaches Endocrine:  denies fatigue Hematologic/Lymphatic:  No areas of easy bleeding  Exam BP 120/76 (BP Location: Left Arm, Patient Position: Sitting, Cuff Size: Large)   Pulse 83   Temp 97.7 F (36.5 C) (Temporal)   Ht 5' 4"  (1.626 m)   Wt 248 lb (112.5 kg)   LMP  (LMP Unknown)   SpO2 96%   BMI 42.57 kg/m  General:  well developed, well nourished, in no apparent distress Skin:  no significant moles, warts, or growths Head:  no masses, lesions, or tenderness Eyes:  pupils equal and round, sclera anicteric without injection Ears:  canals without lesions, TMs shiny without retraction, no obvious effusion, no erythema Nose:  nares patent, septum midline, mucosa normal, and no drainage or sinus tenderness Throat/Pharynx:  lips and gingiva without lesion; tongue and uvula midline; non-inflamed pharynx; no exudates or postnasal drainage Neck: neck supple without  adenopathy, thyromegaly, or masses Lungs:  clear to auscultation, breath sounds equal bilaterally, no respiratory distress Cardio:  regular rate and rhythm, no bruits, no LE edema Abdomen:  abdomen soft, nontender; bowel sounds normal; no masses or organomegaly Genital: Defer to GYN Musculoskeletal:  symmetrical muscle groups noted without atrophy or deformity Neuro:  No cerebellar signs Psych: well oriented with normal range of affect and appropriate judgment/insight  Assessment and Plan  Well adult exam  Morbid obesity ([place])   Well 58 y.o. female. Counseled on diet and exercise. Flu shot for mid Oct.  Follow up in 6 mo or CPE. The patient voiced understanding and agreement to the plan.  [place], DO 12/13/18 11:42 AM

## 2018-12-29 DIAGNOSIS — H6983 Other specified disorders of Eustachian tube, bilateral: Secondary | ICD-10-CM | POA: Diagnosis not present

## 2018-12-29 DIAGNOSIS — J310 Chronic rhinitis: Secondary | ICD-10-CM | POA: Diagnosis not present

## 2018-12-29 DIAGNOSIS — R0982 Postnasal drip: Secondary | ICD-10-CM | POA: Diagnosis not present

## 2018-12-29 DIAGNOSIS — J343 Hypertrophy of nasal turbinates: Secondary | ICD-10-CM | POA: Diagnosis not present

## 2019-01-08 DIAGNOSIS — L400 Psoriasis vulgaris: Secondary | ICD-10-CM | POA: Diagnosis not present

## 2019-01-08 DIAGNOSIS — L738 Other specified follicular disorders: Secondary | ICD-10-CM | POA: Diagnosis not present

## 2019-01-08 DIAGNOSIS — D2271 Melanocytic nevi of right lower limb, including hip: Secondary | ICD-10-CM | POA: Diagnosis not present

## 2019-01-08 DIAGNOSIS — D2261 Melanocytic nevi of right upper limb, including shoulder: Secondary | ICD-10-CM | POA: Diagnosis not present

## 2019-01-08 DIAGNOSIS — Z85820 Personal history of malignant melanoma of skin: Secondary | ICD-10-CM | POA: Diagnosis not present

## 2019-01-08 DIAGNOSIS — L718 Other rosacea: Secondary | ICD-10-CM | POA: Diagnosis not present

## 2019-01-08 DIAGNOSIS — D2262 Melanocytic nevi of left upper limb, including shoulder: Secondary | ICD-10-CM | POA: Diagnosis not present

## 2019-01-08 DIAGNOSIS — D225 Melanocytic nevi of trunk: Secondary | ICD-10-CM | POA: Diagnosis not present

## 2019-01-08 DIAGNOSIS — D2272 Melanocytic nevi of left lower limb, including hip: Secondary | ICD-10-CM | POA: Diagnosis not present

## 2019-01-16 ENCOUNTER — Other Ambulatory Visit: Payer: Self-pay

## 2019-01-17 ENCOUNTER — Telehealth: Payer: Self-pay

## 2019-01-17 ENCOUNTER — Other Ambulatory Visit: Payer: Self-pay

## 2019-01-17 ENCOUNTER — Encounter: Payer: Self-pay | Admitting: Family Medicine

## 2019-01-17 ENCOUNTER — Ambulatory Visit: Payer: Medicare HMO | Admitting: Family Medicine

## 2019-01-17 ENCOUNTER — Ambulatory Visit (INDEPENDENT_AMBULATORY_CARE_PROVIDER_SITE_OTHER): Payer: Medicare HMO | Admitting: Family Medicine

## 2019-01-17 VITALS — BP 110/72 | HR 78 | Temp 97.1°F | Ht 64.0 in | Wt 246.4 lb

## 2019-01-17 DIAGNOSIS — L03116 Cellulitis of left lower limb: Secondary | ICD-10-CM

## 2019-01-17 MED ORDER — DOXYCYCLINE HYCLATE 100 MG PO TABS: 100.0000 mg | ORAL_TABLET | Freq: Two times a day (BID) | ORAL | 0 refills | Status: DC

## 2019-01-17 MED ORDER — CEFTRIAXONE SODIUM 1 G IJ SOLR
1.0000 g | Freq: Once | INTRAMUSCULAR | Status: AC
  Administered 2019-01-17: 15:00:00 1 g via INTRAMUSCULAR

## 2019-01-17 MED ORDER — FERROUS SULFATE 324 (65 FE) MG PO TBEC: 1.0000 | DELAYED_RELEASE_TABLET | Freq: Three times a day (TID) | ORAL | 2 refills | Status: DC

## 2019-01-17 NOTE — Patient Instructions (Signed)
Ice/cold pack over area for 10-15 min twice daily.  OK to take Tylenol 1000 mg (2 extra strength tabs) or 975 mg (3 regular strength tabs) every 6 hours as needed.  Things to look out for: increasing pain not relieved by ibuprofen/acetaminophen, fevers, spreading redness, drainage of pus, or foul odor.  Let us know if you need anything.

## 2019-01-17 NOTE — Telephone Encounter (Signed)
Called left message to call back 

## 2019-01-17 NOTE — Telephone Encounter (Signed)
Copied from [place] [phone]. Topic: Appointment Scheduling - Scheduling Inquiry for Clinic >> Jan 17, 2019  7:49 AM [name] wrote: Reason for CRM: Pt called to see if she can change er appt time to 12:45 today/ please call Pt to change if space is still open

## 2019-01-17 NOTE — Progress Notes (Signed)
Chief Complaint  Patient presents with  . Leg Swelling    left lower leg and rash    [name] is a 58 y.o. female here for a skin complaint.  Duration: 2 days Location: LLE Pruritic? No Painful? Yes Drainage? No New soaps/lotions/topicals/detergents? No Sick contacts? No Other associated symptoms: spreading, warmth Therapies tried thus far: none No fevers.  +hx of cellulitis  ROS:  Const: No fevers Skin: As noted in HPI  Past Medical History:  Diagnosis Date  . Anemia due to antineoplastic chemotherapy 04/16/2015  . Anxiety state 06/01/2007   Qualifier: Diagnosis of  By: Ronnald Ramp RN, [place], [place]    . Asthma   . CHF (congestive heart failure) ([place])   . COPD (chronic obstructive pulmonary disease) ([place])   . Crohn's disease without complication ([place]) 98/02/9146  . Depression   . 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  The Custom gene panel offered by [facility] includes sequencing and rearrangement analysis for the following 24 genes:  ATM, BAP1, BARD1, BRCA1, BRCA2, BRIP1, CDH1, CDK4, CDKN2A, CHEK2, EPCAM, FANCC, MITF, MLH1, MSH2, MSH6, NBN, PALB2, PMS2, PTEN, RAD51C, RAD51D, TP53, and XRCC2.   The report date is February 13, 2016.  Marland Kitchen GERD (gastroesophageal reflux disease)   . Heart murmur   . History of melanoma excision 03/23/2015  . Hypertension   . Leukopenia due to antineoplastic chemotherapy ([place]) 04/16/2015  . Melanoma ([place])    rt. dorsal leg  . MORBID OBESITY 06/01/2007   Qualifier: Diagnosis of  By: Ronnald Ramp RN, CGRN, [place]    . Morbid obesity with BMI of 50.0-59.9, adult ([place]) 03/23/2015  . OPEN WOUND OF KNEE LEG AND ANKLE COMPLICATED 82/95/6213   Qualifier: Diagnosis of  By: Patsy Baltimore RN, Langley Gauss    . Seasonal allergies   . Skin cancer 2003   melanoma  . Umbilical hernia without obstruction and without gangrene 03/23/2015  . Urinary, incontinence, stress female 10/27/2016    BP 110/72 (BP Location: Left Arm, Patient  Position: Sitting, Cuff Size: Large)   Pulse 78   Temp (!) 97.1 F (36.2 C) (Temporal)   Ht _0  (1.626 m)   Wt 246 lb 6 oz (111.8 kg)   LMP  (LMP Unknown)   SpO2 98%   BMI 42.29 kg/m  Gen: awake, alert, appearing stated age Lungs: No accessory muscle use Skin: See below.  There is warmth, some tenderness to palpation.  No drainage, fluctuance, excoriation Psych: Age appropriate judgment and insight   Anterior medial left lower extremity   Posterior left lower extremity   Cellulitis of left lower extremity - Plan: doxycycline (VIBRA-TABS) 100 MG tablet, cefTRIAXone (ROCEPHIN) injection 1 g  Rocephin today, course of doxycycline.  Warning signs and symptoms verbalized and written down. F/u prn. The patient voiced understanding and agreement to the plan.  [place], DO 01/17/19 3:09 PM

## 2019-01-17 NOTE — Telephone Encounter (Signed)
appt changed

## 2019-01-25 ENCOUNTER — Telehealth: Payer: Self-pay

## 2019-01-25 NOTE — Telephone Encounter (Signed)
Copied from [place] [id number]. Topic: General - Other >> Jan 25, 2019 12:52 PM Keene Breath wrote: Reason for CRM: Patient called to ask that the nurse call her to send in medication for a yeast infection.  Please call to discuss at [phone]

## 2019-01-26 ENCOUNTER — Telehealth: Payer: Self-pay | Admitting: Family Medicine

## 2019-01-26 MED ORDER — FLUCONAZOLE 150 MG PO TABS: 150.0000 mg | ORAL_TABLET | Freq: Once | ORAL | 0 refills | Status: AC

## 2019-01-26 NOTE — Telephone Encounter (Signed)
It was sent. Ty.

## 2019-01-26 NOTE — Addendum Note (Signed)
Addended by: Ames Coupe on: 01/26/2019 07:10 AM   Modules accepted: Orders

## 2019-01-26 NOTE — Telephone Encounter (Signed)
Sent!

## 2019-01-26 NOTE — Telephone Encounter (Signed)
Copied from [place] [id number]. Topic: General - Other >> Jan 25, 2019 12:52 PM Keene Breath wrote: Reason for CRM: Patient called to ask that the nurse call her to send in medication for a yeast infection.  Please call to discuss at [phone] >> Jan 25, 2019  4:20 PM Celene Kras A wrote: Pt is also requesting to have fluconzacole 150 mg sent in for her as well. Please advise.

## 2019-02-03 IMAGING — MG DIGITAL SCREENING BILATERAL MAMMOGRAM WITH TOMO AND CAD
6 of 10 series · 6 of 30 positions shown · non-contrast
Comparison: Previous exam(s).

CLINICAL DATA: Screening.

EXAM:
DIGITAL SCREENING BILATERAL MAMMOGRAM WITH TOMO AND CAD

[L MLO synth-2D (1 of 2)]
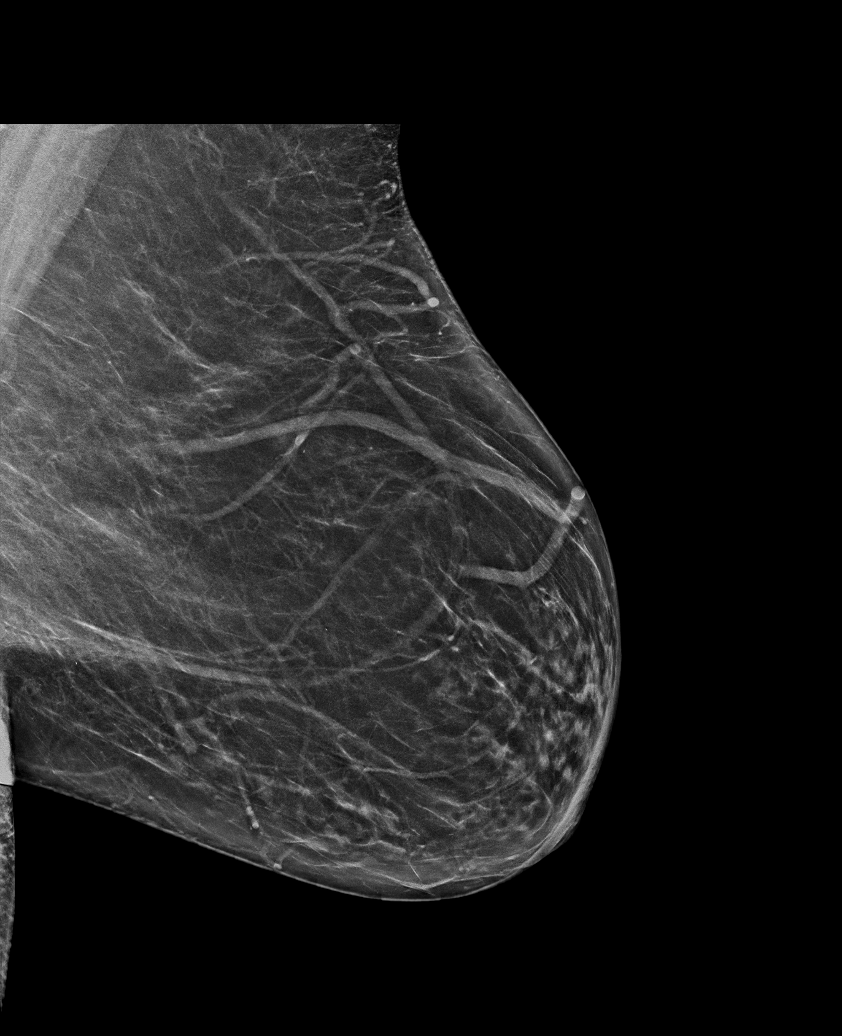

[L CC synth-2D]
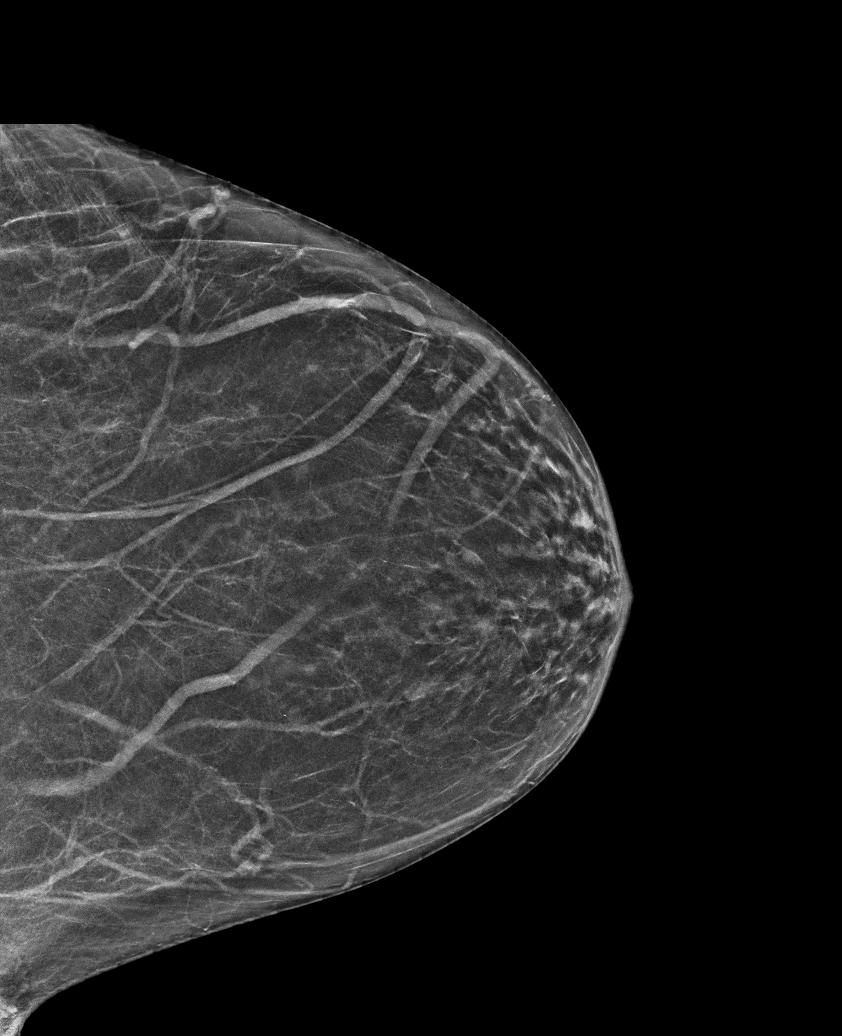

[R MLO synth-2D]
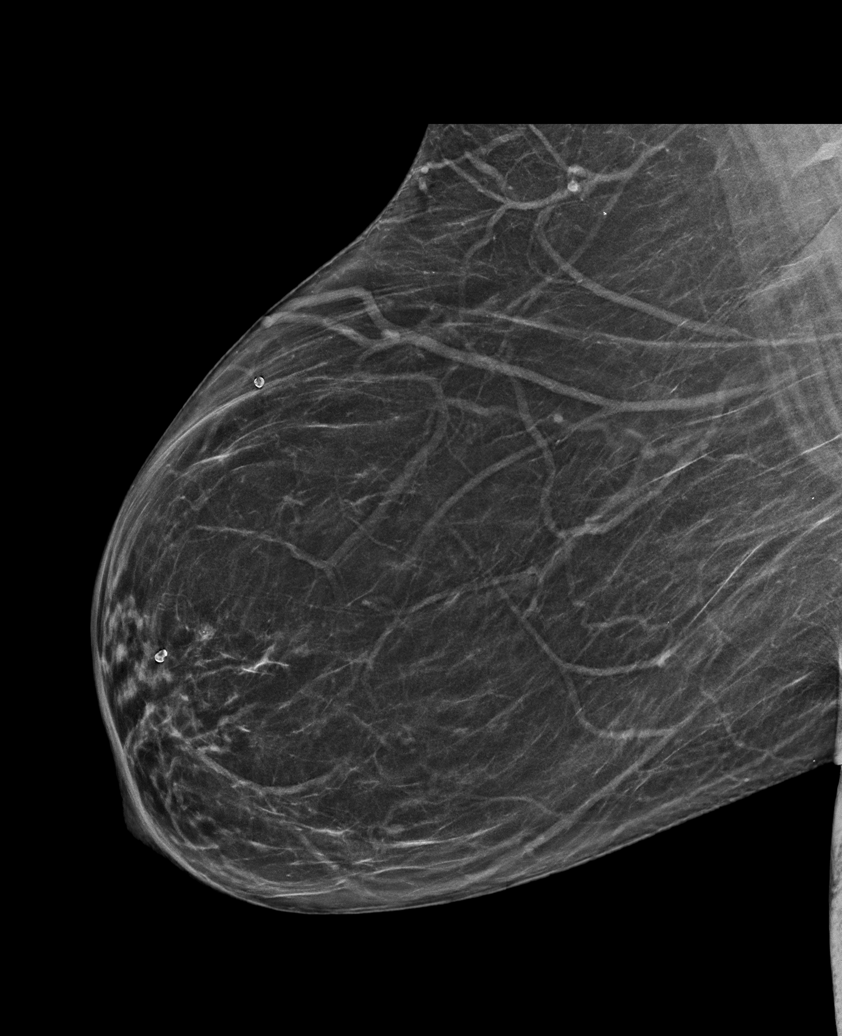

[R CC synth-2D]
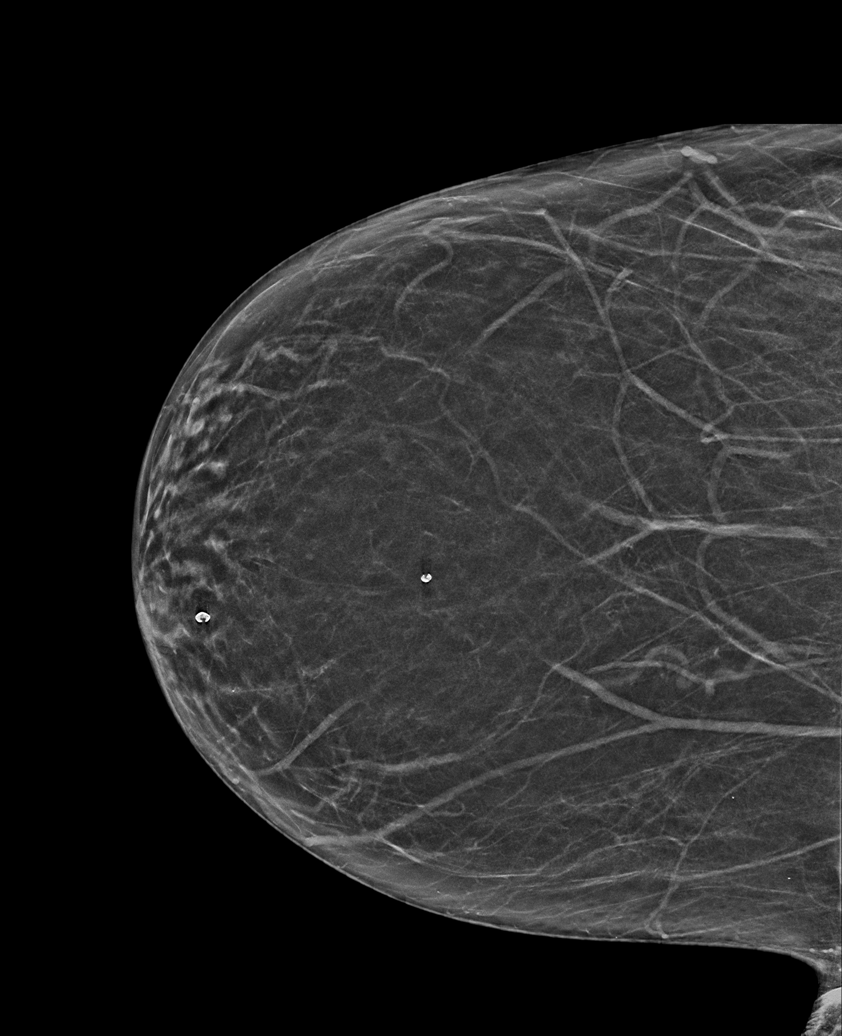

[L MLO synth-2D (2 of 2)]
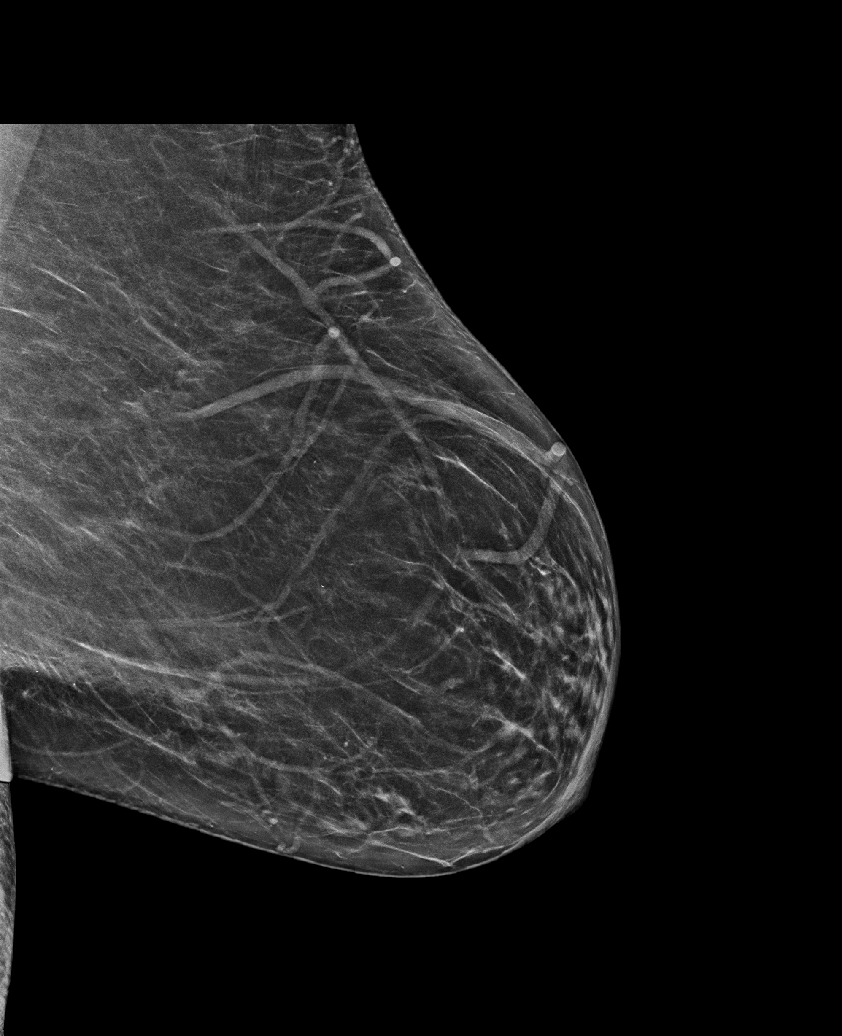

[R CC tomo · tomo slice 27/53.0]
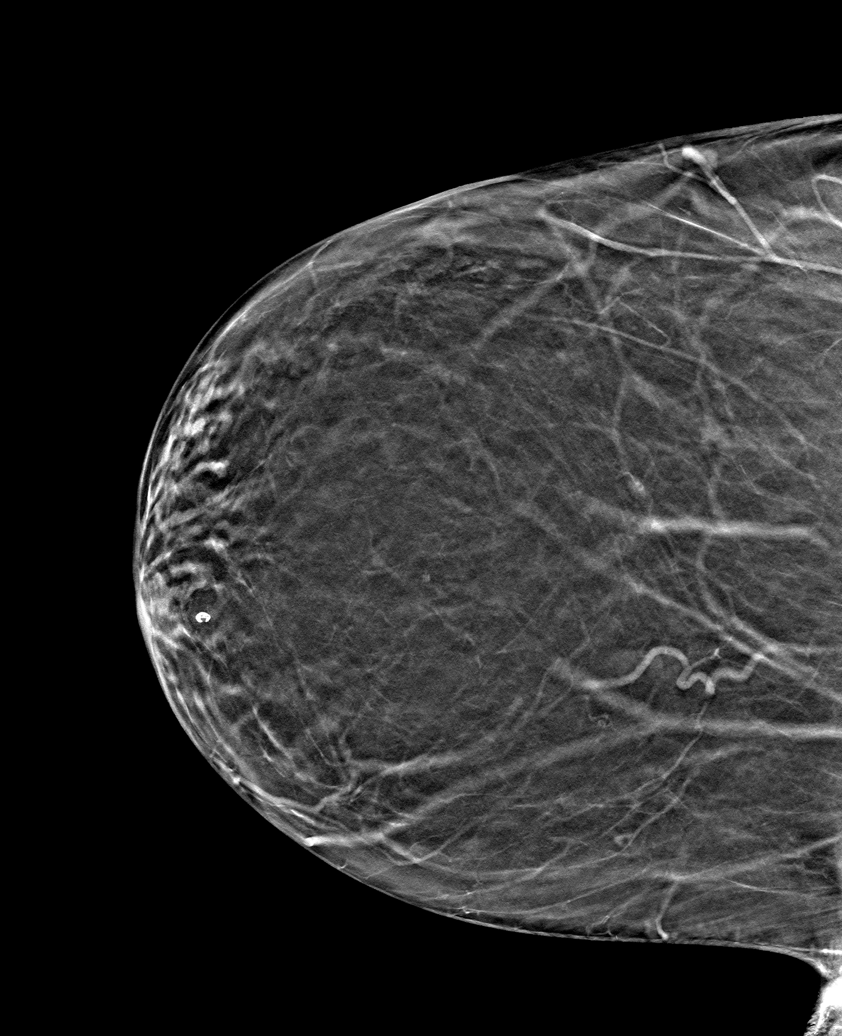

[6 of 30 positions shown; findings below may reference images not displayed]

ACR Breast Density Category b: There are scattered areas of
fibroglandular density.
FINDINGS: There are no findings suspicious for malignancy. Images were
processed with CAD.
IMPRESSION: No mammographic evidence of malignancy. A result letter of this
screening mammogram will be mailed directly to the patient.

RECOMMENDATION:
Screening mammogram in one year. (Code:[id number])

BI-RADS CATEGORY  1: Negative.

## 2019-02-07 ENCOUNTER — Ambulatory Visit (INDEPENDENT_AMBULATORY_CARE_PROVIDER_SITE_OTHER): Payer: Medicare HMO | Admitting: Family Medicine

## 2019-02-07 ENCOUNTER — Encounter: Payer: Self-pay | Admitting: Family Medicine

## 2019-02-07 ENCOUNTER — Other Ambulatory Visit: Payer: Self-pay

## 2019-02-07 DIAGNOSIS — K921 Melena: Secondary | ICD-10-CM | POA: Diagnosis not present

## 2019-02-07 DIAGNOSIS — R5383 Other fatigue: Secondary | ICD-10-CM | POA: Diagnosis not present

## 2019-02-07 DIAGNOSIS — E611 Iron deficiency: Secondary | ICD-10-CM

## 2019-02-07 NOTE — Progress Notes (Signed)
Chief Complaint  Patient presents with  . Fatigue    Subjective: Patient is a 58 y.o. female here for fatigue. Due to COVID-19 pandemic, we are interacting via telephone. I verified patient's ID using 2 identifiers. Patient agreed to proceed with visit via this method. Patient is at home, I am at office. Patient and I are present for visit.   1 mo has had worsening fatigue and dark/tarry stools with odd smell. Eating and drinking normally. No snoring/sleep changes, mood issues, blood in urine, fevers, illness, diarrhea. +hx of Fe def, takes Fe daily. Just increased to bid. Gives her nausea if she takes too much.   ROS: Endo: No wt changes  Past Medical History:  Diagnosis Date  . Anemia due to antineoplastic chemotherapy 04/16/2015  . Anxiety state 06/01/2007   Qualifier: Diagnosis of  By: Ronnald Ramp RN, [place], [place]    . Asthma   . CHF (congestive heart failure) ([place])   . COPD (chronic obstructive pulmonary disease) ([place])   . Crohn's disease without complication ([place]) 70/26/3785  . Depression   . 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  The Custom gene panel offered by [facility] includes sequencing and rearrangement analysis for the following 24 genes:  ATM, BAP1, BARD1, BRCA1, BRCA2, BRIP1, CDH1, CDK4, CDKN2A, CHEK2, EPCAM, FANCC, MITF, MLH1, MSH2, MSH6, NBN, PALB2, PMS2, PTEN, RAD51C, RAD51D, TP53, and XRCC2.   The report date is February 13, 2016.  Marland Kitchen GERD (gastroesophageal reflux disease)   . Heart murmur   . History of melanoma excision 03/23/2015  . Hypertension   . Leukopenia due to antineoplastic chemotherapy ([place]) 04/16/2015  . Melanoma ([place])    rt. dorsal leg  . MORBID OBESITY 06/01/2007   Qualifier: Diagnosis of  By: Ronnald Ramp RN, CGRN, Sheri    . Morbid obesity with BMI of 50.0-59.9, adult ([place]) 03/23/2015  . OPEN WOUND OF KNEE LEG AND ANKLE COMPLICATED 88/50/2774   Qualifier: Diagnosis of  By: Patsy Baltimore RN, Langley Gauss    . Seasonal allergies    . Skin cancer 2003   melanoma  . Umbilical hernia without obstruction and without gangrene 03/23/2015  . Urinary, incontinence, stress female 10/27/2016    Objective: No conversational dyspnea Age appropriate judgment and insight Nml affect and mood  Assessment and Plan: Other fatigue - Plan: CBC, Comprehensive metabolic panel, TSH  Melena - Plan: IBC + Ferritin, Fecal occult blood, imunochemical  Iron deficiency - Plan: IBC + Ferritin, Fecal occult blood, imunochemical  Orders as above. Total time spent: 12 min The patient voiced understanding and agreement to the plan.  [place], DO 02/07/19  2:33 PM

## 2019-02-09 ENCOUNTER — Telehealth: Payer: Self-pay | Admitting: Family Medicine

## 2019-02-09 NOTE — Telephone Encounter (Signed)
Not fasting.

## 2019-02-09 NOTE — Telephone Encounter (Signed)
Patient informed to be not fasting.

## 2019-02-09 NOTE — Telephone Encounter (Signed)
Copied from [place] [id number]. Topic: General - Other >> Feb 09, 2019  1:46 PM Sheran Luz wrote: Patient inquired if upcoming labs should be fasting.

## 2019-02-14 ENCOUNTER — Other Ambulatory Visit (INDEPENDENT_AMBULATORY_CARE_PROVIDER_SITE_OTHER): Payer: Medicare HMO

## 2019-02-14 ENCOUNTER — Other Ambulatory Visit: Payer: Self-pay | Admitting: Family Medicine

## 2019-02-14 ENCOUNTER — Other Ambulatory Visit: Payer: Self-pay

## 2019-02-14 DIAGNOSIS — R779 Abnormality of plasma protein, unspecified: Secondary | ICD-10-CM

## 2019-02-14 DIAGNOSIS — E8809 Other disorders of plasma-protein metabolism, not elsewhere classified: Secondary | ICD-10-CM

## 2019-02-14 DIAGNOSIS — E611 Iron deficiency: Secondary | ICD-10-CM

## 2019-02-14 DIAGNOSIS — R5383 Other fatigue: Secondary | ICD-10-CM | POA: Diagnosis not present

## 2019-02-14 DIAGNOSIS — K921 Melena: Secondary | ICD-10-CM

## 2019-02-14 LAB — COMPREHENSIVE METABOLIC PANEL
ALT: 21 U/L (ref 0–35)
AST: 25 U/L (ref 0–37)
Albumin: 3.6 g/dL (ref 3.5–5.2)
Alkaline Phosphatase: 78 U/L (ref 39–117)
BUN: 29 mg/dL — ABNORMAL HIGH (ref 6–23)
CO2: 26 mEq/L (ref 19–32)
Calcium: 9 mg/dL (ref 8.4–10.5)
Chloride: 100 mEq/L (ref 96–112)
Creatinine, Ser: 0.92 mg/dL (ref 0.40–1.20)
GFR: 62.57 mL/min (ref >60.00)
Glucose, Bld: 83 mg/dL (ref 70–99)
Potassium: 4 mEq/L (ref 3.5–5.1)
Sodium: 134 mEq/L — ABNORMAL LOW (ref 135–145)
Total Bilirubin: 0.5 mg/dL (ref 0.2–1.2)
Total Protein: 8.5 g/dL — ABNORMAL HIGH (ref 6.0–8.3)

## 2019-02-14 LAB — CBC
HCT: 36.6 % (ref 36.0–46.0)
Hemoglobin: 12.1 g/dL (ref 12.0–15.0)
MCHC: 33 g/dL (ref 30.0–36.0)
MCV: 86.6 fl (ref 78.0–100.0)
Platelets: 236 10*3/uL (ref 150.0–400.0)
RBC: 4.22 Mil/uL (ref 3.87–5.11)
RDW: 14.5 % (ref 11.5–15.5)
WBC: 3.6 10*3/uL — ABNORMAL LOW (ref 4.0–10.5)

## 2019-02-14 LAB — IBC + FERRITIN
Ferritin: 60.7 ng/mL (ref 10.0–291.0)
Iron: 71 ug/dL (ref 42–145)
Saturation Ratios: 21.8 % (ref 20.0–50.0)
Transferrin: 233 mg/dL (ref 212.0–360.0)

## 2019-02-14 LAB — TSH: TSH: 1.91 u[IU]/mL (ref 0.35–4.50)

## 2019-02-16 ENCOUNTER — Other Ambulatory Visit: Payer: Self-pay

## 2019-02-16 ENCOUNTER — Encounter: Payer: Self-pay | Admitting: Family Medicine

## 2019-02-16 ENCOUNTER — Other Ambulatory Visit (INDEPENDENT_AMBULATORY_CARE_PROVIDER_SITE_OTHER): Payer: Medicare HMO

## 2019-02-16 DIAGNOSIS — E8809 Other disorders of plasma-protein metabolism, not elsewhere classified: Secondary | ICD-10-CM

## 2019-02-16 DIAGNOSIS — R779 Abnormality of plasma protein, unspecified: Secondary | ICD-10-CM

## 2019-02-16 LAB — PROTEIN ELECTROPHORESIS, SERUM
Albumin ELP: 3.4 g/dL — ABNORMAL LOW (ref 3.8–4.8)
Alpha 1: 0.3 g/dL (ref 0.2–0.3)
Alpha 2: 0.8 g/dL (ref 0.5–0.9)
Beta 2: 0.5 g/dL (ref 0.2–0.5)
Beta Globulin: 0.5 g/dL (ref 0.4–0.6)
Gamma Globulin: 2.8 g/dL — ABNORMAL HIGH (ref 0.8–1.7)
Total Protein: 8.3 g/dL — ABNORMAL HIGH (ref 6.1–8.1)

## 2019-02-19 ENCOUNTER — Telehealth: Payer: Self-pay | Admitting: Family Medicine

## 2019-02-19 ENCOUNTER — Other Ambulatory Visit (INDEPENDENT_AMBULATORY_CARE_PROVIDER_SITE_OTHER): Payer: Medicare HMO

## 2019-02-19 DIAGNOSIS — E611 Iron deficiency: Secondary | ICD-10-CM

## 2019-02-19 DIAGNOSIS — K921 Melena: Secondary | ICD-10-CM

## 2019-02-19 LAB — FECAL OCCULT BLOOD, IMMUNOCHEMICAL: Fecal Occult Bld: NEGATIVE

## 2019-02-19 NOTE — Telephone Encounter (Signed)
Copied from [place] [id number]. Topic: General - Other >> Feb 19, 2019  3:35 PM Yvette Rack wrote: Reason for CRM: Pt stated she was returning call to [facility] for lab results. Pt requests call back. See results notes

## 2019-02-20 ENCOUNTER — Encounter: Payer: Self-pay | Admitting: Family Medicine

## 2019-02-20 LAB — PROTEIN,TOTAL AND ELECT AND IFE,24
Creatinine, 24H Ur: 0.92 g/(24.h) (ref 0.50–2.15)
PROTEIN/CREATININE RATIO: 0.261 mg/mg creat — ABNORMAL HIGH (ref <=0.1)
PROTEIN/CREATININE RATIO: 261 mg/g creat — ABNORMAL HIGH (ref <=114)
Protein, 24H Urine: 240 mg/24 h — ABNORMAL HIGH (ref 0–149)

## 2019-02-22 ENCOUNTER — Encounter: Payer: Self-pay | Admitting: Family Medicine

## 2019-02-23 ENCOUNTER — Other Ambulatory Visit: Payer: Self-pay | Admitting: Family Medicine

## 2019-02-23 DIAGNOSIS — R809 Proteinuria, unspecified: Secondary | ICD-10-CM

## 2019-02-23 NOTE — Progress Notes (Unsigned)
u

## 2019-02-23 NOTE — Telephone Encounter (Signed)
See result notes. 

## 2019-02-23 NOTE — Telephone Encounter (Signed)
Pt calling again to see if Shirlean Mylar has results of last urine test done.

## 2019-02-27 ENCOUNTER — Other Ambulatory Visit (INDEPENDENT_AMBULATORY_CARE_PROVIDER_SITE_OTHER): Payer: Medicare HMO

## 2019-02-27 ENCOUNTER — Encounter: Payer: Self-pay | Admitting: Family Medicine

## 2019-02-27 ENCOUNTER — Other Ambulatory Visit: Payer: Self-pay

## 2019-02-27 DIAGNOSIS — R809 Proteinuria, unspecified: Secondary | ICD-10-CM

## 2019-02-27 LAB — URINALYSIS
Bilirubin Urine: NEGATIVE
Hgb urine dipstick: NEGATIVE
Ketones, ur: NEGATIVE
Leukocytes,Ua: NEGATIVE
Nitrite: NEGATIVE
Specific Gravity, Urine: <=1.005 — AB (ref 1.000–1.030)
Total Protein, Urine: NEGATIVE
Urine Glucose: NEGATIVE
Urobilinogen, UA: 0.2 (ref 0.0–1.0)
pH: 6.5 (ref 5.0–8.0)

## 2019-03-02 ENCOUNTER — Telehealth: Payer: Self-pay | Admitting: Family Medicine

## 2019-03-02 NOTE — Telephone Encounter (Signed)
Copied from [place] [phone]. Topic: General - Other >> Mar 01, 2019  9:15 AM Celene Kras wrote: Reason for CRM: Pt called stating [name] has been trying to reach her. Please advise.   See result notes

## 2019-03-16 ENCOUNTER — Other Ambulatory Visit: Payer: Self-pay | Admitting: Family Medicine

## 2019-03-16 DIAGNOSIS — Z1231 Encounter for screening mammogram for malignant neoplasm of breast: Secondary | ICD-10-CM

## 2019-03-22 DIAGNOSIS — K921 Melena: Secondary | ICD-10-CM | POA: Diagnosis not present

## 2019-03-22 DIAGNOSIS — R10813 Right lower quadrant abdominal tenderness: Secondary | ICD-10-CM | POA: Diagnosis not present

## 2019-03-22 DIAGNOSIS — Z6841 Body Mass Index (BMI) 40.0 and over, adult: Secondary | ICD-10-CM | POA: Diagnosis not present

## 2019-03-22 DIAGNOSIS — I10 Essential (primary) hypertension: Secondary | ICD-10-CM | POA: Diagnosis not present

## 2019-03-22 DIAGNOSIS — K5730 Diverticulosis of large intestine without perforation or abscess without bleeding: Secondary | ICD-10-CM | POA: Diagnosis not present

## 2019-03-22 DIAGNOSIS — Z79899 Other long term (current) drug therapy: Secondary | ICD-10-CM | POA: Diagnosis not present

## 2019-03-26 ENCOUNTER — Other Ambulatory Visit: Payer: Self-pay | Admitting: Family Medicine

## 2019-03-26 DIAGNOSIS — R195 Other fecal abnormalities: Secondary | ICD-10-CM | POA: Diagnosis not present

## 2019-03-26 DIAGNOSIS — D649 Anemia, unspecified: Secondary | ICD-10-CM | POA: Diagnosis not present

## 2019-03-26 MED ORDER — ALBUTEROL SULFATE HFA 108 (90 BASE) MCG/ACT IN AERS: 1.0000 | INHALATION_SPRAY | Freq: Four times a day (QID) | RESPIRATORY_TRACT | 2 refills | Status: DC | PRN

## 2019-04-24 DIAGNOSIS — R195 Other fecal abnormalities: Secondary | ICD-10-CM | POA: Diagnosis not present

## 2019-04-24 DIAGNOSIS — K921 Melena: Secondary | ICD-10-CM | POA: Diagnosis not present

## 2019-05-01 ENCOUNTER — Inpatient Hospital Stay: Payer: Medicare Other | Attending: Gynecologic Oncology

## 2019-05-01 ENCOUNTER — Telehealth: Payer: Self-pay | Admitting: *Deleted

## 2019-05-01 ENCOUNTER — Encounter: Payer: Self-pay | Admitting: Gynecologic Oncology

## 2019-05-01 ENCOUNTER — Inpatient Hospital Stay (HOSPITAL_BASED_OUTPATIENT_CLINIC_OR_DEPARTMENT_OTHER): Payer: Medicare Other | Admitting: Gynecologic Oncology

## 2019-05-01 ENCOUNTER — Other Ambulatory Visit: Payer: Self-pay

## 2019-05-01 VITALS — BP 129/75 | HR 78 | Temp 98.2°F | Resp 20 | Ht 64.0 in | Wt 247.4 lb

## 2019-05-01 DIAGNOSIS — Z8543 Personal history of malignant neoplasm of ovary: Secondary | ICD-10-CM | POA: Diagnosis present

## 2019-05-01 DIAGNOSIS — Z9221 Personal history of antineoplastic chemotherapy: Secondary | ICD-10-CM | POA: Insufficient documentation

## 2019-05-01 DIAGNOSIS — Z08 Encounter for follow-up examination after completed treatment for malignant neoplasm: Secondary | ICD-10-CM | POA: Insufficient documentation

## 2019-05-01 DIAGNOSIS — Z90722 Acquired absence of ovaries, bilateral: Secondary | ICD-10-CM | POA: Diagnosis not present

## 2019-05-01 DIAGNOSIS — C562 Malignant neoplasm of left ovary: Secondary | ICD-10-CM

## 2019-05-01 DIAGNOSIS — Z90710 Acquired absence of both cervix and uterus: Secondary | ICD-10-CM | POA: Insufficient documentation

## 2019-05-01 NOTE — Telephone Encounter (Signed)
Returned the patient's call and left the appt time for day

## 2019-05-01 NOTE — Progress Notes (Signed)
Follow-up Note: Gyn-Onc  Consult was initially requested by Dr. Corinna Capra for the evaluation of [name] 59 y.o. 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  CC:  Chief Complaint  Patient presents with  . Ovarian Cancer    follow up    Assessment/Plan:  [name]  is a 59 y.o.  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 Adjuvant chemotherapy from 03/26/15-05/29/15.  Complete clinical response of post-treatment imaging from 07/04/15.  She completed less than 3 full cycles of adjuvant chemotherapy with carboplatin and paclitaxel, due to extreme bone marrow toxicity.   She has no evidence of recurrence on exam.  She will see me in 6 months and continue at this frequency until February, 2022.  HPI: [name] is a 59 year old woman who was seen in consultation at the request of Dr Corinna Capra for clear cell ovarian cancer.  The patient has a history of abnormal uterine bleeding/symptomatic fibroids. An US of the pelvis in September 2016 showed a 7cm complex cystic mass seen in the left ovary. A repeat US in October 2016 showed tha the mass had increased to approximately 10cm and was accompanied with normal blood flow and a normal CA 125 (10U/mL on 12/18/14).  She then underwent an ex lap (via Pfannenstiel) TAH, BSO and partial omentectomy on 02/11/15 with Dr Corinna Capra at [facility] in [place]. At the time of surgery, the large cystic mass was appreciated to be densely adherent to the pelvic sidewalls and in the process of removing it, unavoidable rupture occurred. Of note, pelvic washings taken from before the cyst rupture were negative for malignancy. According to the operative note there was complete resection of the cyst with no residual tumor adhesions or tissues in the pelvis and none identified in the peritoneal cavity. The omentum was grossly normal, and at the time of frozen section revealing  malignancy, a sample from the infracolic omentum was taken and was noted to be negative for malignancy.  Final pathology from the surgery revealed clear cell carcinoma of the left ovary. The uterus and contralateral tube and ovary were benign. The omentum was negative for malignancy.  She has done well postoperatively with no major morbidities.  The patient has major medical conditions of morbid obesity (BMI 54 kg/m2), crohn's disease, HTN, osteoporosis.  She was first seen by me on 02/24/2015 and at that consultation we recommended 6 cycles of adjuvant carboplatin and paclitaxel chemotherapy after a pretreatment baseline CT scan.  CT scan of the chest abdomen and pelvis on 03/07/2015 showed node apparent metastatic disease however it did demonstrate some signs of postoperative changes and fluid collections. She went on to commence adjuvant chemotherapy with Dr. Marko Plume with day 1 of cycle 1 dose dense, but the paclitaxel on 03/26/2015. She had multiple dose delays and reductions through the course of her treatment due to neutropenia thrombocytopenia and anemia. She required Granix bone marrow stimulation. Her final chemotherapy dose was day 1 of cycle 3 given on every 16th 2017. As the patient was poorly tolerating treatment the patient and her oncologist myometrial decision to discontinue treatment at that point in time.  A post therapeutic CT of the abdomen and pelvis on 07/04/2015 showed no apparent residual or recurrent GYN malignancy. Lab evaluations on 07/22/2015 showed normalization of her white blood cell count 4.4, hemoglobin was 9.8, and platelet count was 279.  CA 125 on 10/17/15 was normal at  8 and normal at 9.3 on 06/23/16. CT abdo/pelvis on 07/05/16 ordered for persistent left sided abdominal pain showed a "tiny" umbilical hernia but no evidence of recurrence and no apparent explanation for her left sided abdominal pain. CA 125 on 9.2 on 02/16/17. CA 125 was normal at 8.8 on 05/18/17  She  reported abdominal pains in 2019 and these were worked up with a CT scan of the abdomen and pelvis on 5 referral 2020 which revealed no evidence of ovarian carcinoma recurrence of metastases, stable size of pelvic lymph nodes unchanged from comparison CT from 2018.  Mild left colonic diverticulosis without diverticulitis.  She underwent hernia repair - uncomplicated.  CA 125 from 05/16/18 normal at 7.5  She reported abdominal pain in February, 2020 and therefore CT abd/pelvis was performed which revealed no evidence of ovarian cancer recurrence.  There was mild left colonic diverticulosis without diverticulitis present.  CA 125 from 11/06/18 7.2.   Interval Hx:  She has no symptoms concerning for recurrence. She had a positive hemoccult test and is having a colonoscopy to follow-up this result.   Current Meds:  Outpatient Encounter Medications as of 05/01/2019  Medication Sig  . ibuprofen (ADVIL) 600 MG tablet Take 1 tablet by mouth 3 (three) times daily.  . traZODone (DESYREL) 50 MG tablet Take 0.5 tablets by mouth at bedtime.  Marland Kitchen albuterol (VENTOLIN HFA) 108 (90 Base) MCG/ACT inhaler Inhale 1-2 puffs into the lungs every 6 (six) hours as needed for wheezing or shortness of breath.  . ALPRAZolam (XANAX) 1 MG tablet Take 1 tablet (1 mg total) by mouth at bedtime as needed for anxiety.  Marland Kitchen amLODipine (NORVASC) 2.5 MG tablet Take 1 tablet (2.5 mg total) by mouth 2 (two) times daily.  Marland Kitchen apixaban (ELIQUIS) 5 MG TABS tablet Take 1 tablet (5 mg total) by mouth 2 (two) times daily.  . Ascorbic Acid (VITAMIN C) 1000 MG tablet Take 1,000 mg by mouth daily.  . budesonide-formoterol (SYMBICORT) 160-4.5 MCG/ACT inhaler Inhale 2 puffs into the lungs 2 (two) times daily.  . Calcium Carbonate-Vit D-Min (CALCIUM 1200 PO) Take 1 tablet by mouth daily.  . Cholecalciferol (VITAMIN D3) 5000 UNITS TABS Take 1 tablet by mouth daily. Reported on 06/26/2015  . doxycycline (VIBRA-TABS) 100 MG tablet Take 1 tablet (100 mg  total) by mouth 2 (two) times daily.  Marland Kitchen ELDERBERRY PO Take 1,250 mg by mouth daily.  . famotidine (PEPCID) 20 MG tablet TAKE 1 TABLET TWICE DAILY  . ferrous sulfate 324 (65 Fe) MG TBEC Take 1 tablet (325 mg total) by mouth 3 (three) times daily.  . Lactobacillus (ACIDOPHILUS PO) Take 2 capsules by mouth daily.   . methocarbamol (ROBAXIN) 500 MG tablet TK 1 T PO Q 8 H PRF MUSCLE PAIN / RELAXATION  . montelukast (SINGULAIR) 10 MG tablet Take 1 tablet (10 mg total) by mouth at bedtime. Reported on 10/20/2015  . Nebulizers MISC 1 Units by Misc.(Non-Drug; Combo Route) route every 8 (eight) hours as needed.  . Omega-3 Fatty Acids (FISH OIL) 1000 MG CAPS Take 2 capsules by mouth daily. Reported on 06/26/2015  . promethazine (PHENERGAN) 25 MG tablet Take 25 mg by mouth every 6 (six) hours as needed for nausea or vomiting.  . sertraline (ZOLOFT) 50 MG tablet Take 1 tablet (50 mg total) by mouth daily.  Marland Kitchen triamterene-hydrochlorothiazide (MAXZIDE) 75-50 MG tablet Take 1 tablet by mouth daily. Reported on 06/12/2015  . vitamin E 400 UNIT capsule Take 400 Units by mouth daily. Reported  on 06/26/2015   No facility-administered encounter medications on file as of 05/01/2019.    Allergy:  Allergies  Allergen Reactions  . Levofloxacin Palpitations    REACTION: tachycardia  . Moxifloxacin Palpitations    REACTION: tachycardia but tolerated cirpofloxacin just fine  . Stadol [Butorphanol] Other (See Comments)    Caused shakes for 6 weeks "shaking for weeks"  . Amoxicillin Rash    Can take 500 mg - amounts greater causes yeast infection REACTION: Rash with high doses. Can take lower doses like 564m  . Asa [Aspirin] Other (See Comments)    Pt stated the 81 mg caused Exacerbation of her asthma  - unsure about other asprins Asthma exacerbation  . Ciprofloxacin Hcl Nausea And Vomiting    Can take lower doses like 5032m Other causes yeast infection Can take lower doses like 50094m . Codeine Nausea Only and  Nausea And Vomiting    REACTION: nausea  . Effexor [Venlafaxine] Other (See Comments)    hyperactivity Panic attacks  . Erythromycin Diarrhea and Nausea And Vomiting  . Latex Rash  . Paroxetine Other (See Comments)    Made nerves worse and caused fatigue  . Butorphanol Tartrate     Gave her the shakes for 6 weeks  . Cephalosporins     REACTION: she has tolerated rocephin and Keflex without difficulty  . Clindamycin/Lincomycin Other (See Comments)    Tears stomach up.  . Influenza Vaccines     Pt states that she got really sick from flu vaccine was sick a total of 6 weeks  . Losartan Other (See Comments)    Headache, nausea, fatigue  . Paxil [Paroxetine Hcl] Other (See Comments)    fatigue  . Clarithromycin Diarrhea  . Clindamycin Diarrhea  . Tape Rash    Social Hx:   Social History   Socioeconomic History  . Marital status: Married    Spouse name: Not on file  . Number of children: 0  . Years of education: Not on file  . Highest education level: Not on file  Occupational History  . Occupation: disabled  Tobacco Use  . Smoking status: Never Smoker  . Smokeless tobacco: Never Used  Substance and Sexual Activity  . Alcohol use: No  . Drug use: No  . Sexual activity: Not on file  Other Topics Concern  . Not on file  Social History Narrative  . Not on file   Social Determinants of Health   Financial Resource Strain:   . Difficulty of Paying Living Expenses: Not on file  Food Insecurity:   . Worried About RunCharity fundraiser the Last Year: Not on file  . Ran Out of Food in the Last Year: Not on file  Transportation Needs:   . Lack of Transportation (Medical): Not on file  . Lack of Transportation (Non-Medical): Not on file  Physical Activity:   . Days of Exercise per Week: Not on file  . Minutes of Exercise per Session: Not on file  Stress:   . Feeling of Stress : Not on file  Social Connections:   . Frequency of Communication with Friends and Family: Not  on file  . Frequency of Social Gatherings with Friends and Family: Not on file  . Attends Religious Services: Not on file  . Active Member of Clubs or Organizations: Not on file  . Attends CluArchivistetings: Not on file  . Marital Status: Not on file  Intimate Partner Violence:   . Fear  of Current or Ex-Partner: Not on file  . Emotionally Abused: Not on file  . Physically Abused: Not on file  . Sexually Abused: Not on file    Past Surgical Hx:  Past Surgical History:  Procedure Laterality Date  . ABDOMINAL HYSTERECTOMY Bilateral 02/11/2015   Procedure: HYSTERECTOMY ABDOMINAL, bilateral salpingo-oophorectomy;  Surgeon: Louretta Shorten, MD;  Location: [facility];  Service: Gynecology;  Laterality: Bilateral;  . COLONOSCOPY WITH ESOPHAGOGASTRODUODENOSCOPY (EGD)    . DILATATION & CURETTAGE/HYSTEROSCOPY WITH TRUECLEAR N/A 08/17/2013   Procedure: DILATATION & CURETTAGE/HYSTEROSCOPY WITH TRUCLEAR;  Surgeon: Luz Lex, MD;  Location: [facility];  Service: Gynecology;  Laterality: N/A;  . IR REMOVAL TUN ACCESS W/ PORT W/O FL MOD SED  07/05/2017  . MELANOMA EXCISION  06/2001   right leg; also removed 2 lymph nodes  . OMENTECTOMY N/A 02/11/2015   Procedure: OMENTECTOMY;  Surgeon: Louretta Shorten, MD;  Location: [facility];  Service: Gynecology;  Laterality: N/A;  . UMBILICAL HERNIA REPAIR      Past Medical Hx:  Past Medical History:  Diagnosis Date  . Anemia due to antineoplastic chemotherapy 04/16/2015  . Anxiety state 06/01/2007   Qualifier: Diagnosis of  By: Ronnald Ramp RN, [place], [place]    . Asthma   . Asymptomatic proteinuria    Intermittent  . CHF (congestive heart failure) ([place])   . COPD (chronic obstructive pulmonary disease) ([place])   . Crohn's disease without complication ([place]) 02/72/5366  . Depression   . 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  The Custom gene panel offered by [facility] includes sequencing and rearrangement analysis for the following 24  genes:  ATM, BAP1, BARD1, BRCA1, BRCA2, BRIP1, CDH1, CDK4, CDKN2A, CHEK2, EPCAM, FANCC, MITF, MLH1, MSH2, MSH6, NBN, PALB2, PMS2, PTEN, RAD51C, RAD51D, TP53, and XRCC2.   The report date is February 13, 2016.  Marland Kitchen GERD (gastroesophageal reflux disease)   . Heart murmur   . History of melanoma excision 03/23/2015  . Hypertension   . Leukopenia due to antineoplastic chemotherapy ([place]) 04/16/2015  . Morbid obesity with BMI of 50.0-59.9, adult ([place]) 03/23/2015  . OPEN WOUND OF KNEE LEG AND ANKLE COMPLICATED 44/06/4740   Qualifier: Diagnosis of  By: Patsy Baltimore RN, [place] Gauss    . Seasonal allergies   . Umbilical hernia without obstruction and without gangrene 03/23/2015  . Urinary, incontinence, stress female 10/27/2016    Past Gynecological History:  See HPI  No LMP recorded (lmp unknown). Patient has had a hysterectomy.  Family Hx:  Family History  Problem Relation Age of Onset  . Colon polyps Father   . Diabetes Father   . Heart disease Paternal Uncle   . Asthma Mother   . Melanoma Maternal Aunt   . Diabetes Paternal Grandmother   . Breast cancer Cousin        maternal first cousin  . Colon cancer Neg Hx     Review of Systems:  Constitutional  Feels well,    ENT Normal appearing ears and nares bilaterally Skin/Breast  No rash, sores, jaundice, itching, dryness Cardiovascular  + SOB and edema Pulmonary  +cough  Gastro Intestinal  No nausea, vomitting, or diarrhoea. + bright red blood per rectum, no abdominal pain, change in bowel movement, or constipation.  Genito Urinary  No frequency, urgency, dysuria, see HPI + stress urinary incontinence Musculo Skeletal  No myalgia, arthralgia, joint swelling or pain  Neurologic  No weakness, numbness, change in gait,  Psychology  No depression, anxiety,  insomnia.   Vitals:  There were no vitals taken for this visit.  Physical Exam: WD in NAD Neck  Supple NROM, without any enlargements.  Lymph Node Survey No cervical supraclavicular  or inguinal adenopathy Cardiovascular  Pulse normal rate, regularity and rhythm. S1 and S2 normal.  Lungs  Clear to auscultation bilateraly, without wheezes/crackles/rhonchi. Good air movement.  Skin  No rash/lesions/breakdown  Psychiatry  Alert and oriented to person, place, and time  Abdomen  Normoactive bowel sounds, abdomen soft, non-tender and obese. Well healed low transverse incision.  Hernia repaired.  Back No CVA tenderness Genito Urinary  Vulva/vagina: Normal external female genitalia.  No lesions. No discharge or bleeding.  Bladder/urethra:  No lesions or masses, well supported bladder  Vagina: grossly normal with in tact cuff.  Cervix: surgically absent  Uterus: surgically absent    Adnexa: no palpable masses. Rectal  Good tone, no masses no cul de sac nodularity.  Extremities  No bilateral cyanosis, clubbing or edema.   Thereasa Solo, MD  05/01/2019, 2:28 PM     Follow-up Note: Gyn-Onc  Consult was initially requested by Dr. Corinna Capra for the evaluation of [name] 59 y.o. 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  CC:  Chief Complaint  Patient presents with  . Ovarian Cancer    follow up    Assessment/Plan:  [name]  is a 59 y.o.  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 Adjuvant chemotherapy from 03/26/15-05/29/15.  Complete clinical response of post-treatment imaging from 07/04/15.  She completed less than 3 full cycles of adjuvant chemotherapy with carboplatin and paclitaxel, due to extreme bone marrow toxicity.   She has no evidence of recurrence on exam. CA 125 pending, if normal, she will have her port removed.  Syphilis and HIV testing performed.   She will see me in 6 months and continue at this frequency until February, 2022.  HPI: [name] is a 59 year old woman who was seen in consultation at the request of Dr Corinna Capra for clear cell  ovarian cancer.  The patient has a history of abnormal uterine bleeding/symptomatic fibroids. An US of the pelvis in September 2016 showed a 7cm complex cystic mass seen in the left ovary. A repeat US in October 2016 showed tha the mass had increased to approximately 10cm and was accompanied with normal blood flow and a normal CA 125 (10U/mL on 12/18/14).  She then underwent an ex lap (via Pfannenstiel) TAH, BSO and partial omentectomy on 02/11/15 with Dr Corinna Capra at [facility] in [place]. At the time of surgery, the large cystic mass was appreciated to be densely adherent to the pelvic sidewalls and in the process of removing it, unavoidable rupture occurred. Of note, pelvic washings taken from before the cyst rupture were negative for malignancy. According to the operative note there was complete resection of the cyst with no residual tumor adhesions or tissues in the pelvis and none identified in the peritoneal cavity. The omentum was grossly normal, and at the time of frozen section revealing malignancy, a sample from the infracolic omentum was taken and was noted to be negative for malignancy.  Final pathology from the surgery revealed clear cell carcinoma of the left ovary. The uterus and contralateral tube and ovary were benign. The omentum was negative for malignancy.  She has done well postoperatively with no major morbidities.  The patient has major medical conditions  of morbid obesity (BMI 54 kg/m2), crohn's disease, HTN, osteoporosis.  She was first seen by me on 02/24/2015 and at that consultation we recommended 6 cycles of adjuvant carboplatin and paclitaxel chemotherapy after a pretreatment baseline CT scan.  CT scan of the chest abdomen and pelvis on 03/07/2015 showed node apparent metastatic disease however it did demonstrate some signs of postoperative changes and fluid collections. She went on to commence adjuvant chemotherapy with Dr. Marko Plume with day 1 of cycle 1 dose dense, but  the paclitaxel on 03/26/2015. She had multiple dose delays and reductions through the course of her treatment due to neutropenia thrombocytopenia and anemia. She required Granix bone marrow stimulation. Her final chemotherapy dose was day 1 of cycle 3 given on every 16th 2017. As the patient was poorly tolerating treatment the patient and her oncologist myometrial decision to discontinue treatment at that point in time.  A post therapeutic CT of the abdomen and pelvis on 07/04/2015 showed no apparent residual or recurrent GYN malignancy. Lab evaluations on 07/22/2015 showed normalization of her white blood cell count 4.4, hemoglobin was 9.8, and platelet count was 279.  CA 125 on 10/17/15 was normal at 8 and normal at 9.3 on 06/23/16. CT abdo/pelvis on 07/05/16 ordered for persistent left sided abdominal pain showed a "tiny" umbilical hernia but no evidence of recurrence and no apparent explanation for her left sided abdominal pain. CA 125 on 9.2 on 02/16/17. CA 125 was normal at 8.8 on 05/18/17  She reported abdominal pains in 2019 and these were worked up with a CT scan of the abdomen and pelvis on 5 referral 2020 which revealed no evidence of ovarian carcinoma recurrence of metastases, stable size of pelvic lymph nodes unchanged from comparison CT from 2018.  Mild left colonic diverticulosis without diverticulitis.  She underwent hernia repair - uncomplicated.  Interval Hx:   CA 125 from 05/16/18 normal at 7.5  She reported abdominal pain in February, 2020 and therefore CT abd/pelvis was performed which revealed no evidence of ovarian cancer recurrence.  There was mild left colonic diverticulosis without diverticulitis present.  She reported a possible sexual exposure while not conscious a few years ago and is concerned about the possibility of syphilis or HIV being an occult infection.  She denies symptoms of these diseases.  She declined my office to assist in criminal investigation into her prior  experiences and desired only STD testing.   CA 125 from 11/06/18 pending.   Current Meds:  Outpatient Encounter Medications as of 05/01/2019  Medication Sig  . ibuprofen (ADVIL) 600 MG tablet Take 1 tablet by mouth every 6 (six) hours as needed.   . [DISCONTINUED] traZODone (DESYREL) 50 MG tablet Take 0.5 tablets by mouth at bedtime.  Marland Kitchen albuterol (VENTOLIN HFA) 108 (90 Base) MCG/ACT inhaler Inhale 1-2 puffs into the lungs every 6 (six) hours as needed for wheezing or shortness of breath.  . ALPRAZolam (XANAX) 1 MG tablet Take 1 tablet (1 mg total) by mouth at bedtime as needed for anxiety.  Marland Kitchen amLODipine (NORVASC) 2.5 MG tablet Take 1 tablet (2.5 mg total) by mouth 2 (two) times daily.  . Ascorbic Acid (VITAMIN C) 1000 MG tablet Take 1,000 mg by mouth daily.  . budesonide-formoterol (SYMBICORT) 160-4.5 MCG/ACT inhaler Inhale 2 puffs into the lungs 2 (two) times daily.  . Calcium Carbonate-Vit D-Min (CALCIUM 1200 PO) Take 1 tablet by mouth daily.  Marland Kitchen doxycycline (VIBRA-TABS) 100 MG tablet Take 1 tablet (100 mg total) by mouth 2 (two)  times daily.  Marland Kitchen ELDERBERRY PO Take 1,250 mg by mouth daily.  . famotidine (PEPCID) 20 MG tablet TAKE 1 TABLET TWICE DAILY  . Lactobacillus (ACIDOPHILUS PO) Take 2 capsules by mouth daily.   . Nebulizers MISC 1 Units by Misc.(Non-Drug; Combo Route) route every 8 (eight) hours as needed.  . Omega-3 Fatty Acids (FISH OIL) 1000 MG CAPS Take 2 capsules by mouth daily. Reported on 06/26/2015  . promethazine (PHENERGAN) 25 MG tablet Take 25 mg by mouth every 6 (six) hours as needed for nausea or vomiting.  . sertraline (ZOLOFT) 50 MG tablet Take 1 tablet (50 mg total) by mouth daily.  Marland Kitchen triamterene-hydrochlorothiazide (MAXZIDE) 75-50 MG tablet Take 1 tablet by mouth daily. Reported on 06/12/2015  . vitamin E 400 UNIT capsule Take 400 Units by mouth daily. Reported on 06/26/2015  . [DISCONTINUED] apixaban (ELIQUIS) 5 MG TABS tablet Take 1 tablet (5 mg total) by mouth 2 (two)  times daily.  . [DISCONTINUED] Cholecalciferol (VITAMIN D3) 5000 UNITS TABS Take 1 tablet by mouth daily. Reported on 06/26/2015  . [DISCONTINUED] ferrous sulfate 324 (65 Fe) MG TBEC Take 1 tablet (325 mg total) by mouth 3 (three) times daily.  . [DISCONTINUED] methocarbamol (ROBAXIN) 500 MG tablet TK 1 T PO Q 8 H PRF MUSCLE PAIN / RELAXATION  . [DISCONTINUED] montelukast (SINGULAIR) 10 MG tablet Take 1 tablet (10 mg total) by mouth at bedtime. Reported on 10/20/2015   No facility-administered encounter medications on file as of 05/01/2019.    Allergy:  Allergies  Allergen Reactions  . Levofloxacin Palpitations    REACTION: tachycardia  . Moxifloxacin Palpitations    REACTION: tachycardia but tolerated cirpofloxacin just fine  . Stadol [Butorphanol] Other (See Comments)    Caused shakes for 6 weeks "shaking for weeks"  . Amoxicillin Rash    Can take 500 mg - amounts greater causes yeast infection REACTION: Rash with high doses. Can take lower doses like 544m  . Asa [Aspirin] Other (See Comments)    Pt stated the 81 mg caused Exacerbation of her asthma  - unsure about other asprins Asthma exacerbation  . Ciprofloxacin Hcl Nausea And Vomiting    Can take lower doses like 5028m Other causes yeast infection Can take lower doses like 50073m . Codeine Nausea Only and Nausea And Vomiting    REACTION: nausea  . Effexor [Venlafaxine] Other (See Comments)    hyperactivity Panic attacks  . Erythromycin Diarrhea and Nausea And Vomiting  . Latex Rash  . Paroxetine Other (See Comments)    Made nerves worse and caused fatigue  . Butorphanol Tartrate     Gave her the shakes for 6 weeks  . Cephalosporins     REACTION: she has tolerated rocephin and Keflex without difficulty  . Clindamycin/Lincomycin Other (See Comments)    Tears stomach up.  . Influenza Vaccines     Pt states that she got really sick from flu vaccine was sick a total of 6 weeks  . Losartan Other (See Comments)     Headache, nausea, fatigue  . Paxil [Paroxetine Hcl] Other (See Comments)    fatigue  . Clarithromycin Diarrhea  . Clindamycin Diarrhea  . Tape Rash    Social Hx:   Social History   Socioeconomic History  . Marital status: Married    Spouse name: Not on file  . Number of children: 0  . Years of education: Not on file  . Highest education level: Not on file  Occupational History  .  Occupation: disabled  Tobacco Use  . Smoking status: Never Smoker  . Smokeless tobacco: Never Used  Substance and Sexual Activity  . Alcohol use: No  . Drug use: No  . Sexual activity: Not on file  Other Topics Concern  . Not on file  Social History Narrative  . Not on file   Social Determinants of Health   Financial Resource Strain:   . Difficulty of Paying Living Expenses: Not on file  Food Insecurity:   . Worried About Charity fundraiser in the Last Year: Not on file  . Ran Out of Food in the Last Year: Not on file  Transportation Needs:   . Lack of Transportation (Medical): Not on file  . Lack of Transportation (Non-Medical): Not on file  Physical Activity:   . Days of Exercise per Week: Not on file  . Minutes of Exercise per Session: Not on file  Stress:   . Feeling of Stress : Not on file  Social Connections:   . Frequency of Communication with Friends and Family: Not on file  . Frequency of Social Gatherings with Friends and Family: Not on file  . Attends Religious Services: Not on file  . Active Member of Clubs or Organizations: Not on file  . Attends Archivist Meetings: Not on file  . Marital Status: Not on file  Intimate Partner Violence:   . Fear of Current or Ex-Partner: Not on file  . Emotionally Abused: Not on file  . Physically Abused: Not on file  . Sexually Abused: Not on file    Past Surgical Hx:  Past Surgical History:  Procedure Laterality Date  . ABDOMINAL HYSTERECTOMY Bilateral 02/11/2015   Procedure: HYSTERECTOMY ABDOMINAL, bilateral  salpingo-oophorectomy;  Surgeon: Louretta Shorten, MD;  Location: [facility];  Service: Gynecology;  Laterality: Bilateral;  . COLONOSCOPY WITH ESOPHAGOGASTRODUODENOSCOPY (EGD)    . DILATATION & CURETTAGE/HYSTEROSCOPY WITH TRUECLEAR N/A 08/17/2013   Procedure: DILATATION & CURETTAGE/HYSTEROSCOPY WITH TRUCLEAR;  Surgeon: Luz Lex, MD;  Location: [facility];  Service: Gynecology;  Laterality: N/A;  . IR REMOVAL TUN ACCESS W/ PORT W/O FL MOD SED  07/05/2017  . MELANOMA EXCISION  06/2001   right leg; also removed 2 lymph nodes  . OMENTECTOMY N/A 02/11/2015   Procedure: OMENTECTOMY;  Surgeon: Louretta Shorten, MD;  Location: [facility];  Service: Gynecology;  Laterality: N/A;  . UMBILICAL HERNIA REPAIR      Past Medical Hx:  Past Medical History:  Diagnosis Date  . Anemia due to antineoplastic chemotherapy 04/16/2015  . Anxiety state 06/01/2007   Qualifier: Diagnosis of  By: Ronnald Ramp RN, [place], [place]    . Asthma   . Asymptomatic proteinuria    Intermittent  . CHF (congestive heart failure) ([place])   . COPD (chronic obstructive pulmonary disease) ([place])   . Crohn's disease without complication ([place]) 97/67/3419  . Depression   . 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  The Custom gene panel offered by [facility] includes sequencing and rearrangement analysis for the following 24 genes:  ATM, BAP1, BARD1, BRCA1, BRCA2, BRIP1, CDH1, CDK4, CDKN2A, CHEK2, EPCAM, FANCC, MITF, MLH1, MSH2, MSH6, NBN, PALB2, PMS2, PTEN, RAD51C, RAD51D, TP53, and XRCC2.   The report date is February 13, 2016.  Marland Kitchen GERD (gastroesophageal reflux disease)   . Heart murmur   . History of melanoma excision 03/23/2015  . Hypertension   . Leukopenia due to antineoplastic chemotherapy ([place]) 04/16/2015  . Morbid obesity  with BMI of 50.0-59.9, adult ([place]) 03/23/2015  . OPEN WOUND OF KNEE LEG AND ANKLE COMPLICATED 25/49/8264   Qualifier: Diagnosis of  By: Patsy Baltimore RN, Langley Gauss    . Seasonal allergies   . Umbilical hernia  without obstruction and without gangrene 03/23/2015  . Urinary, incontinence, stress female 10/27/2016    Past Gynecological History:  See HPI  No LMP recorded (lmp unknown). Patient has had a hysterectomy.  Family Hx:  Family History  Problem Relation Age of Onset  . Colon polyps Father   . Diabetes Father   . Heart disease Paternal Uncle   . Asthma Mother   . Melanoma Maternal Aunt   . Diabetes Paternal Grandmother   . Breast cancer Cousin        maternal first cousin  . Colon cancer Neg Hx     Review of Systems:  Constitutional  Feels well,    ENT Normal appearing ears and nares bilaterally Skin/Breast  No rash, sores, jaundice, itching, dryness Cardiovascular  + SOB and edema Pulmonary  +cough  Gastro Intestinal  No nausea, vomitting, or diarrhoea. + bright red blood per rectum, no abdominal pain, change in bowel movement, or constipation.  Genito Urinary  No frequency, urgency, dysuria, see HPI + stress urinary incontinence Musculo Skeletal  No myalgia, arthralgia, joint swelling or pain  Neurologic  No weakness, numbness, change in gait,  Psychology  No depression, anxiety, insomnia.   Vitals:  Blood pressure 129/75, pulse 78, temperature 98.2 F (36.8 C), temperature source Temporal, resp. rate 20, height 5' 4"  (1.626 m), weight 247 lb 7 oz (112.2 kg), SpO2 97 %.  Physical Exam: WD in NAD Neck  Supple NROM, without any enlargements.  Lymph Node Survey No cervical supraclavicular or inguinal adenopathy Cardiovascular  Pulse normal rate, regularity and rhythm. S1 and S2 normal.  Lungs  Clear to auscultation bilateraly, without wheezes/crackles/rhonchi. Good air movement.  Skin  No rash/lesions/breakdown  Psychiatry  Alert and oriented to person, place, and time  Abdomen  Normoactive bowel sounds, abdomen soft, non-tender and obese. Well healed low transverse incision.  Hernia repaired.  Back No CVA tenderness Genito Urinary  Vulva/vagina: Normal  external female genitalia.  No lesions. No discharge or bleeding.  Bladder/urethra:  No lesions or masses, well supported bladder  Vagina: grossly normal with in tact cuff.  Cervix: surgically absent  Uterus: surgically absent    Adnexa: no palpable masses. Rectal  Good tone, no masses no cul de sac nodularity.  Extremities  No bilateral cyanosis, clubbing or edema.   Thereasa Solo, MD  05/01/2019, 2:36 PM

## 2019-05-01 NOTE — Patient Instructions (Signed)
Please notify Dr Denman George at phone number [phone] if you notice vaginal bleeding, new pelvic or abdominal pains, bloating, feeling full easy, or a change in bladder or bowel function.   Please contact Dr Serita Grit office (at [phone]) in April, 2021 to request an appointment with her for July, 2021.

## 2019-05-02 ENCOUNTER — Telehealth: Payer: Self-pay | Admitting: *Deleted

## 2019-05-02 ENCOUNTER — Other Ambulatory Visit: Payer: Self-pay | Admitting: Family Medicine

## 2019-05-02 LAB — CA 125: Cancer Antigen (CA) 125: 7.9 U/mL (ref 0.0–38.1)

## 2019-05-02 NOTE — Telephone Encounter (Signed)
Left Message regarding stable CA 125 level, and for patient to call office with any questions or concerns.

## 2019-05-08 ENCOUNTER — Ambulatory Visit: Payer: Medicare HMO

## 2019-05-08 DIAGNOSIS — Z01812 Encounter for preprocedural laboratory examination: Secondary | ICD-10-CM | POA: Diagnosis not present

## 2019-05-08 DIAGNOSIS — Z01818 Encounter for other preprocedural examination: Secondary | ICD-10-CM | POA: Diagnosis not present

## 2019-05-09 ENCOUNTER — Other Ambulatory Visit: Payer: Self-pay

## 2019-05-09 ENCOUNTER — Ambulatory Visit
Admission: RE | Admit: 2019-05-09 | Discharge: 2019-05-09 | Disposition: A | Discharge status: Home / Self Care | Payer: Medicare Other | Source: Ambulatory Visit | Attending: Family Medicine | Admitting: Family Medicine

## 2019-05-09 DIAGNOSIS — Z1231 Encounter for screening mammogram for malignant neoplasm of breast: Secondary | ICD-10-CM | POA: Diagnosis not present

## 2019-05-11 DIAGNOSIS — J449 Chronic obstructive pulmonary disease, unspecified: Secondary | ICD-10-CM | POA: Diagnosis not present

## 2019-05-11 DIAGNOSIS — Z885 Allergy status to narcotic agent status: Secondary | ICD-10-CM | POA: Diagnosis not present

## 2019-05-11 DIAGNOSIS — R195 Other fecal abnormalities: Secondary | ICD-10-CM | POA: Diagnosis not present

## 2019-05-11 DIAGNOSIS — Z9889 Other specified postprocedural states: Secondary | ICD-10-CM | POA: Diagnosis not present

## 2019-05-11 DIAGNOSIS — K5730 Diverticulosis of large intestine without perforation or abscess without bleeding: Secondary | ICD-10-CM | POA: Diagnosis not present

## 2019-05-11 DIAGNOSIS — Z881 Allergy status to other antibiotic agents status: Secondary | ICD-10-CM | POA: Diagnosis not present

## 2019-05-11 DIAGNOSIS — Z79899 Other long term (current) drug therapy: Secondary | ICD-10-CM | POA: Diagnosis not present

## 2019-05-11 DIAGNOSIS — Z880 Allergy status to penicillin: Secondary | ICD-10-CM | POA: Diagnosis not present

## 2019-05-11 DIAGNOSIS — Z91040 Latex allergy status: Secondary | ICD-10-CM | POA: Diagnosis not present

## 2019-05-11 DIAGNOSIS — K228 Other specified diseases of esophagus: Secondary | ICD-10-CM | POA: Diagnosis not present

## 2019-05-11 DIAGNOSIS — Z859 Personal history of malignant neoplasm, unspecified: Secondary | ICD-10-CM | POA: Diagnosis not present

## 2019-05-11 DIAGNOSIS — Z888 Allergy status to other drugs, medicaments and biological substances status: Secondary | ICD-10-CM | POA: Diagnosis not present

## 2019-05-11 DIAGNOSIS — K449 Diaphragmatic hernia without obstruction or gangrene: Secondary | ICD-10-CM | POA: Diagnosis not present

## 2019-05-11 DIAGNOSIS — K921 Melena: Secondary | ICD-10-CM | POA: Diagnosis not present

## 2019-05-11 DIAGNOSIS — R011 Cardiac murmur, unspecified: Secondary | ICD-10-CM | POA: Diagnosis not present

## 2019-05-11 DIAGNOSIS — R109 Unspecified abdominal pain: Secondary | ICD-10-CM | POA: Diagnosis not present

## 2019-05-11 DIAGNOSIS — M1990 Unspecified osteoarthritis, unspecified site: Secondary | ICD-10-CM | POA: Diagnosis not present

## 2019-05-11 DIAGNOSIS — K219 Gastro-esophageal reflux disease without esophagitis: Secondary | ICD-10-CM | POA: Diagnosis not present

## 2019-05-11 DIAGNOSIS — K3189 Other diseases of stomach and duodenum: Secondary | ICD-10-CM | POA: Diagnosis not present

## 2019-05-11 DIAGNOSIS — K317 Polyp of stomach and duodenum: Secondary | ICD-10-CM | POA: Diagnosis not present

## 2019-05-11 DIAGNOSIS — I10 Essential (primary) hypertension: Secondary | ICD-10-CM | POA: Diagnosis not present

## 2019-05-11 DIAGNOSIS — I2510 Atherosclerotic heart disease of native coronary artery without angina pectoris: Secondary | ICD-10-CM | POA: Diagnosis not present

## 2019-05-11 DIAGNOSIS — Z886 Allergy status to analgesic agent status: Secondary | ICD-10-CM | POA: Diagnosis not present

## 2019-05-11 DIAGNOSIS — Z887 Allergy status to serum and vaccine status: Secondary | ICD-10-CM | POA: Diagnosis not present

## 2019-05-11 DIAGNOSIS — Z883 Allergy status to other anti-infective agents status: Secondary | ICD-10-CM | POA: Diagnosis not present

## 2019-05-11 DIAGNOSIS — K5900 Constipation, unspecified: Secondary | ICD-10-CM | POA: Diagnosis not present

## 2019-05-23 DIAGNOSIS — K219 Gastro-esophageal reflux disease without esophagitis: Secondary | ICD-10-CM | POA: Diagnosis not present

## 2019-05-30 ENCOUNTER — Other Ambulatory Visit: Payer: Self-pay | Admitting: Family Medicine

## 2019-06-12 ENCOUNTER — Ambulatory Visit (INDEPENDENT_AMBULATORY_CARE_PROVIDER_SITE_OTHER): Payer: Medicare Other | Admitting: Family Medicine

## 2019-06-12 ENCOUNTER — Other Ambulatory Visit: Payer: Self-pay

## 2019-06-12 ENCOUNTER — Encounter: Payer: Self-pay | Admitting: Family Medicine

## 2019-06-12 DIAGNOSIS — M1990 Unspecified osteoarthritis, unspecified site: Secondary | ICD-10-CM | POA: Diagnosis not present

## 2019-06-12 DIAGNOSIS — I10 Essential (primary) hypertension: Secondary | ICD-10-CM | POA: Diagnosis not present

## 2019-06-12 DIAGNOSIS — Z86718 Personal history of other venous thrombosis and embolism: Secondary | ICD-10-CM | POA: Insufficient documentation

## 2019-06-12 DIAGNOSIS — J4540 Moderate persistent asthma, uncomplicated: Secondary | ICD-10-CM | POA: Diagnosis not present

## 2019-06-12 DIAGNOSIS — F411 Generalized anxiety disorder: Secondary | ICD-10-CM

## 2019-06-12 MED ORDER — AMLODIPINE BESYLATE 2.5 MG PO TABS: 2.5000 mg | ORAL_TABLET | Freq: Two times a day (BID) | ORAL | 2 refills | Status: DC

## 2019-06-12 MED ORDER — SERTRALINE HCL 50 MG PO TABS: 50.0000 mg | ORAL_TABLET | Freq: Every day | ORAL | 2 refills | Status: DC

## 2019-06-12 MED ORDER — PROMETHAZINE HCL 25 MG PO TABS: 25.0000 mg | ORAL_TABLET | Freq: Four times a day (QID) | ORAL | 1 refills | Status: DC | PRN

## 2019-06-12 MED ORDER — DICLOFENAC SODIUM 1 % EX GEL: 2.0000 g | Freq: Four times a day (QID) | CUTANEOUS | 2 refills | Status: DC

## 2019-06-12 MED ORDER — MONTELUKAST SODIUM 10 MG PO TABS: 10.0000 mg | ORAL_TABLET | Freq: Every day | ORAL | 2 refills | Status: DC

## 2019-06-12 MED ORDER — ALBUTEROL SULFATE HFA 108 (90 BASE) MCG/ACT IN AERS: 1.0000 | INHALATION_SPRAY | Freq: Four times a day (QID) | RESPIRATORY_TRACT | 2 refills | Status: DC | PRN

## 2019-06-12 MED ORDER — TRIAMTERENE-HCTZ 75-50 MG PO TABS: 1.0000 | ORAL_TABLET | Freq: Every day | ORAL | 2 refills | Status: DC

## 2019-06-12 MED ORDER — ALBUTEROL SULFATE (2.5 MG/3ML) 0.083% IN NEBU: 2.5000 mg | INHALATION_SOLUTION | Freq: Four times a day (QID) | RESPIRATORY_TRACT | 1 refills | Status: DC | PRN

## 2019-06-12 MED ORDER — BUDESONIDE-FORMOTEROL FUMARATE 160-4.5 MCG/ACT IN AERO: 2.0000 | INHALATION_SPRAY | Freq: Two times a day (BID) | RESPIRATORY_TRACT | 5 refills | Status: DC

## 2019-06-12 MED ORDER — ALPRAZOLAM 1 MG PO TABS: 1.0000 mg | ORAL_TABLET | Freq: Every evening | ORAL | 0 refills | Status: DC | PRN

## 2019-06-12 NOTE — Progress Notes (Signed)
CC: Med ck  Subjective [name] is a 59 y.o. female who presents for hypertension follow up. Due to COVID-19 pandemic, we are interacting via telephone. I verified patient's ID using 2 identifiers. Patient agreed to proceed with visit via this method. Patient is at home, I am at office. Patient and I are present for visit.  She does not monitor home blood pressures. She is compliant with medications. Patient has these side effects of medication: none She is usually adhering to a healthy diet overall. Current exercise: none  Pt w hx of unproked DVT/PE. Finished her Eliquis >6 mo ago prior to establishing here. F/u LE duplex showed no residual clot. She is not having any swelling or pain in her LE's. Wondering if she needs to be back on Eliquis or if she needs another scan. No SOB.   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 Failed various OTC and rx PO NSAIDs, Tylenol. Has never tried a topical. No swelling, bruising or redness.   Hx of asthma controlled on Singulair and Symbicort. Reports compliance. Does rinse mouth out. No SOB or wheezing, no recent steroid use or hospitalization.  Hx of anxiety on Zoloft 50 mg/d. Reports Xanax 1 mg use prn, usually 1-2 times per mo on average. No SI or HI. No self medication.     Past Medical History:  Diagnosis Date  . Anemia due to antineoplastic chemotherapy 04/16/2015  . Anxiety state 06/01/2007   Qualifier: Diagnosis of  By: Ronnald Ramp RN, [place], [place]    . Asthma   . Asymptomatic proteinuria    Intermittent  . CHF (congestive heart failure) ([place])   . COPD (chronic obstructive pulmonary disease) ([place])   . Crohn's disease without complication ([place]) 53/61/4431  . Depression   . 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  The Custom gene panel offered by [facility] includes sequencing and rearrangement analysis for the following 24 genes:  ATM, BAP1, BARD1, BRCA1, BRCA2, BRIP1, CDH1, CDK4, CDKN2A, CHEK2, EPCAM, FANCC,  MITF, MLH1, MSH2, MSH6, NBN, PALB2, PMS2, PTEN, RAD51C, RAD51D, TP53, and XRCC2.   The report date is February 13, 2016.  Marland Kitchen GERD (gastroesophageal reflux disease)   . Heart murmur   . History of melanoma excision 03/23/2015  . Hypertension   . Leukopenia due to antineoplastic chemotherapy ([place]) 04/16/2015  . Morbid obesity with BMI of 50.0-59.9, adult ([place]) 03/23/2015  . OPEN WOUND OF KNEE LEG AND ANKLE COMPLICATED 54/00/8676   Qualifier: Diagnosis of  By: Patsy Baltimore RN, Langley Gauss    . Seasonal allergies   . Umbilical hernia without obstruction and without gangrene 03/23/2015  . Urinary, incontinence, stress female 10/27/2016    Review of Systems Cardiovascular: no chest pain Respiratory:  no shortness of breath  Exam LMP  (LMP Unknown)  No conversational dyspnea Age appropriate judgment and insight Nml affect and mood  Essential hypertension  Moderate persistent asthma without complication - Plan: budesonide-formoterol (SYMBICORT) 160-4.5 MCG/ACT inhaler  Arthritis  History of DVT (deep vein thrombosis) - Plan: Prothrombin Antibody (IgG), Protein C, total, Protein S, total, Antithrombin III, Factor 5 leiden, CANCELED: Factor 5 leiden  GAD (generalized anxiety disorder)  1- Cont meds, counseled on diet/exercise. 2- refill inhalers, appears to be doing well 3- Topical Voltaren, Tylenol, will consider PT vs referral if no improvement 4- Hypercoag workup. If neg, will not pursue surveillance further 5- Cont meds, Xanax use is <10x/mo on average F/u in 6 mo for CPE or prn.  Total time: 21 min The patient voiced understanding and agreement to the plan.  [place], DO 06/12/19  7:25 PM

## 2019-06-15 ENCOUNTER — Other Ambulatory Visit (INDEPENDENT_AMBULATORY_CARE_PROVIDER_SITE_OTHER): Payer: Medicare Other

## 2019-06-15 ENCOUNTER — Other Ambulatory Visit: Payer: Self-pay

## 2019-06-15 DIAGNOSIS — Z86718 Personal history of other venous thrombosis and embolism: Secondary | ICD-10-CM | POA: Diagnosis not present

## 2019-06-20 LAB — PROTHROMBIN ANTIBODY (IGG): Prothrombin Ab (IgG): <20 G units (ref <20)

## 2019-06-21 DIAGNOSIS — D2272 Melanocytic nevi of left lower limb, including hip: Secondary | ICD-10-CM | POA: Diagnosis not present

## 2019-06-21 DIAGNOSIS — D2261 Melanocytic nevi of right upper limb, including shoulder: Secondary | ICD-10-CM | POA: Diagnosis not present

## 2019-06-21 DIAGNOSIS — L309 Dermatitis, unspecified: Secondary | ICD-10-CM | POA: Diagnosis not present

## 2019-06-21 DIAGNOSIS — D2262 Melanocytic nevi of left upper limb, including shoulder: Secondary | ICD-10-CM | POA: Diagnosis not present

## 2019-06-21 DIAGNOSIS — Z85820 Personal history of malignant melanoma of skin: Secondary | ICD-10-CM | POA: Diagnosis not present

## 2019-06-22 LAB — FACTOR 5 LEIDEN

## 2019-06-22 LAB — PROTEIN S, TOTAL: Protein S Ag, Total: 83 % (ref 60–150)

## 2019-06-22 LAB — PROTEIN C, TOTAL: Protein C Antigen: 107 % (ref 60–150)

## 2019-06-22 LAB — ANTITHROMBIN III: AntiThromb III Func: 90 % (ref 75–135)

## 2019-07-12 ENCOUNTER — Telehealth: Payer: Self-pay | Admitting: Family Medicine

## 2019-07-12 NOTE — Telephone Encounter (Signed)
Patient informed. 

## 2019-07-12 NOTE — Telephone Encounter (Signed)
Correct, no further management/workup/instructions.

## 2019-07-12 NOTE — Telephone Encounter (Signed)
Called left message to call back 

## 2019-07-12 NOTE — Telephone Encounter (Signed)
Advise on her last lab results which I have already let her know, but she wanted to make sure all test were back and there are no more instructions.

## 2019-07-26 ENCOUNTER — Telehealth: Payer: Self-pay | Admitting: Cardiovascular Disease

## 2019-07-26 NOTE — Telephone Encounter (Signed)
Fine with me

## 2019-07-26 NOTE — Telephone Encounter (Signed)
Due to transportation patient will not be able to follow up with Dr. Acie Fredrickson in [place].  She want to know if she can switch to Dr. Geraldo Pitter in the [facility] office.

## 2019-07-26 NOTE — Telephone Encounter (Signed)
Ok by me

## 2019-07-30 NOTE — Telephone Encounter (Signed)
LMTCB... last OV with Dr. Acie Fredrickson was 05/2019 and next follow up to be seen for one year,,, will place 1 year recall for Dr. Haydee Salter in [facility].

## 2019-08-09 DIAGNOSIS — D225 Melanocytic nevi of trunk: Secondary | ICD-10-CM | POA: Diagnosis not present

## 2019-08-09 DIAGNOSIS — D485 Neoplasm of uncertain behavior of skin: Secondary | ICD-10-CM | POA: Diagnosis not present

## 2019-08-15 ENCOUNTER — Encounter: Payer: Self-pay | Admitting: Family Medicine

## 2019-08-15 ENCOUNTER — Other Ambulatory Visit: Payer: Self-pay

## 2019-08-15 ENCOUNTER — Ambulatory Visit (INDEPENDENT_AMBULATORY_CARE_PROVIDER_SITE_OTHER): Payer: Medicare Other | Admitting: Family Medicine

## 2019-08-15 VITALS — BP 122/78 | HR 84 | Temp 96.0°F | Ht 64.5 in | Wt 246.1 lb

## 2019-08-15 DIAGNOSIS — I10 Essential (primary) hypertension: Secondary | ICD-10-CM | POA: Diagnosis not present

## 2019-08-15 DIAGNOSIS — Z6841 Body Mass Index (BMI) 40.0 and over, adult: Secondary | ICD-10-CM

## 2019-08-15 DIAGNOSIS — F411 Generalized anxiety disorder: Secondary | ICD-10-CM

## 2019-08-15 MED ORDER — TEMAZEPAM 15 MG PO CAPS: 15.0000 mg | ORAL_CAPSULE | Freq: Every evening | ORAL | 0 refills | Status: DC | PRN

## 2019-08-15 MED ORDER — CITALOPRAM HYDROBROMIDE 20 MG PO TABS: 20.0000 mg | ORAL_TABLET | Freq: Every day | ORAL | 2 refills | Status: DC

## 2019-08-15 NOTE — Patient Instructions (Signed)
Keep the diet clean and stay active.  Because your blood pressure is well-controlled, you no longer have to check your blood pressure at home anymore unless you wish. Some people check it twice daily every day and some people stop altogether. Either or anything in between is fine. Strong work!  Let us know if you need anything.

## 2019-08-15 NOTE — Progress Notes (Signed)
Chief Complaint  Patient presents with  . Follow-up    Subjective [name] is a 59 y.o. female who presents for hypertension follow up. She does not monitor home blood pressures. She is compliant with medications- Norvasc 2.5 mg bid, Maxzide 70-50 mg/d. Patient has these side effects of medication: none She is usually adhering to a healthy diet overall. Current exercise: some walking  GAD Hx of anxiety, doing OK overall. Zoloft 50 mg/d, doing OK, feels like she wants to try Celexa due to a family member doing well on it. No SI or HI. Uses Xanax prn, usually 1-2x/week on average. Makes her hungry, would like to exchange it for Restoril.    Past Medical History:  Diagnosis Date  . Anemia due to antineoplastic chemotherapy 04/16/2015  . Anxiety state 06/01/2007   Qualifier: Diagnosis of  By: Ronnald Ramp RN, [place], [place]    . Asthma   . Asymptomatic proteinuria    Intermittent  . CHF (congestive heart failure) ([place])   . COPD (chronic obstructive pulmonary disease) ([place])   . Crohn's disease without complication ([place]) 10/06/9483  . Depression   . 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  The Custom gene panel offered by [facility] includes sequencing and rearrangement analysis for the following 24 genes:  ATM, BAP1, BARD1, BRCA1, BRCA2, BRIP1, CDH1, CDK4, CDKN2A, CHEK2, EPCAM, FANCC, MITF, MLH1, MSH2, MSH6, NBN, PALB2, PMS2, PTEN, RAD51C, RAD51D, TP53, and XRCC2.   The report date is February 13, 2016.  Marland Kitchen GERD (gastroesophageal reflux disease)   . Heart murmur   . History of melanoma excision 03/23/2015  . Hypertension   . Leukopenia due to antineoplastic chemotherapy ([place]) 04/16/2015  . Morbid obesity with BMI of 50.0-59.9, adult ([place]) 03/23/2015  . OPEN WOUND OF KNEE LEG AND ANKLE COMPLICATED 46/27/0350   Qualifier: Diagnosis of  By: Patsy Baltimore RN, Langley Gauss    . Seasonal allergies   . Umbilical hernia without obstruction and without gangrene  03/23/2015  . Urinary, incontinence, stress female 10/27/2016    Exam BP 122/78 (BP Location: Left Arm, Patient Position: Sitting, Cuff Size: Large)   Pulse 84   Temp (!) 96 F (35.6 C) (Temporal)   Ht 5' 4.5" (1.638 m)   Wt 246 lb 2 oz (111.6 kg)   LMP  (LMP Unknown)   SpO2 96%   BMI 41.59 kg/m  General:  well developed, well nourished, in no apparent distress Heart: RRR, no bruit Lungs: clear to auscultation, no accessory muscle use Psych: well oriented with normal range of affect and appropriate judgment/insight  Essential hypertension  GAD (generalized anxiety disorder) - Plan: citalopram (CELEXA) 20 MG tablet, temazepam (RESTORIL) 15 MG capsule  Morbid obesity with BMI of 45.0-49.9, adult ([facility])  1- cont CCB. Counseled on diet and exercise. OK to stop ck'ing BP at home. 2- Exchange Xanax for Restoril. Change Zoloft to Celexa. 3- Counseled on diet/exercise.  F/u in 1 mo to reck #2.  The patient voiced understanding and agreement to the plan.  [place], DO 08/15/19  4:11 PM

## 2019-08-17 ENCOUNTER — Encounter: Payer: Self-pay | Admitting: Cardiology

## 2019-08-17 ENCOUNTER — Other Ambulatory Visit: Payer: Self-pay

## 2019-08-17 ENCOUNTER — Ambulatory Visit: Payer: Medicare Other | Admitting: Cardiology

## 2019-08-17 VITALS — BP 120/78 | HR 65 | Ht 64.5 in | Wt 246.0 lb

## 2019-08-17 DIAGNOSIS — R011 Cardiac murmur, unspecified: Secondary | ICD-10-CM

## 2019-08-17 DIAGNOSIS — I10 Essential (primary) hypertension: Secondary | ICD-10-CM | POA: Diagnosis not present

## 2019-08-17 NOTE — Progress Notes (Signed)
Cardiology Office Note:    Date:  08/17/2019   ID:  [name], DOB Apr 11, 1961, MRN [id number]  PCP:  Kaylee Pal, DO  Cardiologist:  Kaylee Lindau, MD   Referring MD: [name]*    ASSESSMENT:    1. Essential hypertension   2. MORBID OBESITY    PLAN:    In order of problems listed above:  1. Primary prevention stressed with the patient.  Importance of compliance with diet medication stressed and she vocalized understanding. 2. Essential hypertension: Blood pressure stable and patient will continue current medications.  Blood work followed by primary care physician. 3. Diastolic heart failure: Stable at this time diet was mentioned weight reduction was stressed she promises to comply.  Echocardiogram will be done for follow-up of left ventricular systolic function.  Previous echo report was emphasized. 4. Morbid obesity: Diet was emphasized.  She plans to do better with her diet and lose weight.  I had an extensive discussion with her. 5. Patient will be seen in follow-up appointment in 6 months or earlier if the patient has any concerns  Medication Adjustments/Labs and Tests Ordered: Current medicines are reviewed at length with the patient today.  Concerns regarding medicines are outlined above.  No orders of the defined types were placed in this encounter.  No orders of the defined types were placed in this encounter.    Chief Complaint  Patient presents with  . Follow-up     History of Present Illness:    [name] is a 59 y.o. female.  Patient has past medical history of essential hypertension and COPD.  She has history of diastolic heart failure.  She is on appropriate medications.  She denies any problems at this time and takes care of activities of daily living.  She is morbidly obese and leads a sedentary lifestyle.  At the time of my evaluation, the patient is alert awake oriented and in no distress.  Past Medical History:    Diagnosis Date  . Anemia due to antineoplastic chemotherapy 04/16/2015  . Anxiety state 06/01/2007   Qualifier: Diagnosis of  By: Ronnald Ramp RN, [place], [place]    . Asthma   . Asymptomatic proteinuria    Intermittent  . CHF (congestive heart failure) ([place])   . COPD (chronic obstructive pulmonary disease) ([place])   . Crohn's disease without complication ([place]) 42/70/6237  . Depression   . 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  The Custom gene panel offered by [facility] includes sequencing and rearrangement analysis for the following 24 genes:  ATM, BAP1, BARD1, BRCA1, BRCA2, BRIP1, CDH1, CDK4, CDKN2A, CHEK2, EPCAM, FANCC, MITF, MLH1, MSH2, MSH6, NBN, PALB2, PMS2, PTEN, RAD51C, RAD51D, TP53, and XRCC2.   The report date is February 13, 2016.  Marland Kitchen GERD (gastroesophageal reflux disease)   . Heart murmur   . History of melanoma excision 03/23/2015  . Hypertension   . Leukopenia due to antineoplastic chemotherapy ([place]) 04/16/2015  . Morbid obesity with BMI of 50.0-59.9, adult ([place]) 03/23/2015  . OPEN WOUND OF KNEE LEG AND ANKLE COMPLICATED 62/83/1517   Qualifier: Diagnosis of  By: Patsy Baltimore RN, Langley Gauss    . Seasonal allergies   . Umbilical hernia without obstruction and without gangrene 03/23/2015  . Urinary, incontinence, stress female 10/27/2016    Past Surgical History:  Procedure Laterality Date  . ABDOMINAL HYSTERECTOMY Bilateral 02/11/2015   Procedure: HYSTERECTOMY ABDOMINAL, bilateral salpingo-oophorectomy;  Surgeon: Louretta Shorten, MD;  Location: [facility];  Service: Gynecology;  Laterality: Bilateral;  . COLONOSCOPY WITH ESOPHAGOGASTRODUODENOSCOPY (EGD)    . DILATATION & CURETTAGE/HYSTEROSCOPY WITH TRUECLEAR N/A 08/17/2013   Procedure: DILATATION & CURETTAGE/HYSTEROSCOPY WITH TRUCLEAR;  Surgeon: Luz Lex, MD;  Location: [facility];  Service: Gynecology;  Laterality: N/A;  . IR REMOVAL TUN ACCESS W/ PORT W/O FL MOD SED  07/05/2017  . MELANOMA EXCISION  06/2001    right leg; also removed 2 lymph nodes  . OMENTECTOMY N/A 02/11/2015   Procedure: OMENTECTOMY;  Surgeon: Louretta Shorten, MD;  Location: [facility];  Service: Gynecology;  Laterality: N/A;  . UMBILICAL HERNIA REPAIR      Current Medications: Current Meds  Medication Sig  . albuterol (PROVENTIL) (2.5 MG/3ML) 0.083% nebulizer solution Take 3 mLs (2.5 mg total) by nebulization every 6 (six) hours as needed for wheezing or shortness of breath.  Marland Kitchen albuterol (VENTOLIN HFA) 108 (90 Base) MCG/ACT inhaler Inhale 1-2 puffs into the lungs every 6 (six) hours as needed for wheezing or shortness of breath.  Marland Kitchen amLODipine (NORVASC) 2.5 MG tablet Take 1 tablet (2.5 mg total) by mouth 2 (two) times daily.  . budesonide-formoterol (SYMBICORT) 160-4.5 MCG/ACT inhaler Inhale 2 puffs into the lungs 2 (two) times daily.  . Calcium Carbonate-Vit D-Min (CALCIUM 1200 PO) Take 1 tablet by mouth daily.  . citalopram (CELEXA) 20 MG tablet Take 1 tablet (20 mg total) by mouth daily.  . diclofenac Sodium (VOLTAREN) 1 % GEL Apply 2 g topically 4 (four) times daily.  Marland Kitchen ELDERBERRY PO Take 1,250 mg by mouth daily.  . Lactobacillus (ACIDOPHILUS PO) Take 2 capsules by mouth daily.   . Nebulizers MISC 1 Units by Misc.(Non-Drug; Combo Route) route every 8 (eight) hours as needed.  . Omega-3 Fatty Acids (FISH OIL) 1000 MG CAPS Take 2 capsules by mouth daily. Reported on 06/26/2015  . pantoprazole (PROTONIX) 40 MG tablet Take by mouth.  . promethazine (PHENERGAN) 25 MG tablet Take 1 tablet (25 mg total) by mouth every 6 (six) hours as needed for nausea or vomiting.  . temazepam (RESTORIL) 15 MG capsule Take 1 capsule (15 mg total) by mouth at bedtime as needed for sleep.  Marland Kitchen triamterene-hydrochlorothiazide (MAXZIDE) 75-50 MG tablet Take 1 tablet by mouth daily. Reported on 06/12/2015     Allergies:   Levofloxacin, Moxifloxacin, Stadol [butorphanol], Amoxicillin, Asa [aspirin], Ciprofloxacin hcl, Codeine, Effexor [venlafaxine], Erythromycin,  Latex, Paroxetine, Butorphanol tartrate, Cephalosporins, Clindamycin/lincomycin, Influenza vaccines, Losartan, Paxil [paroxetine hcl], Clarithromycin, Clindamycin, and Tape   Social History   Socioeconomic History  . Marital status: Married    Spouse name: Not on file  . Number of children: 0  . Years of education: Not on file  . Highest education level: Not on file  Occupational History  . Occupation: disabled  Tobacco Use  . Smoking status: Never Smoker  . Smokeless tobacco: Never Used  Substance and Sexual Activity  . Alcohol use: No  . Drug use: No  . Sexual activity: Not on file  Other Topics Concern  . Not on file  Social History Narrative  . Not on file   Social Determinants of Health   Financial Resource Strain:   . Difficulty of Paying Living Expenses:   Food Insecurity:   . Worried About Charity fundraiser in the Last Year:   . Arboriculturist in the Last Year:   Transportation Needs:   . Film/video editor (Medical):   Marland Kitchen Lack of Transportation (Non-Medical):   Physical  Activity:   . Days of Exercise per Week:   . Minutes of Exercise per Session:   Stress:   . Feeling of Stress :   Social Connections:   . Frequency of Communication with Friends and Family:   . Frequency of Social Gatherings with Friends and Family:   . Attends Religious Services:   . Active Member of Clubs or Organizations:   . Attends Archivist Meetings:   Marland Kitchen Marital Status:      Family History: The patient's family history includes Asthma in her mother; Breast cancer in her cousin; Colon polyps in her father; Diabetes in her father and paternal grandmother; Heart disease in her paternal uncle; Melanoma in her maternal aunt. There is no history of Colon cancer.  ROS:   Please see the history of present illness.    All other systems reviewed and are negative.  EKGs/Labs/Other Studies Reviewed:    The following studies were reviewed today: EKG reveals sinus rhythm LVH  and nonspecific ST-T changes.   Recent Labs: 02/14/2019: ALT 21; BUN 29; Creatinine, Ser 0.92; Hemoglobin 12.1; Platelets 236.0; Potassium 4.0; Sodium 134; TSH 1.91  Recent Lipid Panel    Component Value Date/Time   CHOL 157 11/10/2018 1336   TRIG 48.0 11/10/2018 1336   HDL 54.20 11/10/2018 1336   CHOLHDL 3 11/10/2018 1336   VLDL 9.6 11/10/2018 1336   LDLCALC 93 11/10/2018 1336    Physical Exam:    VS:  BP 120/78   Pulse 65   Ht 5' 4.5" (1.638 m)   Wt 246 lb (111.6 kg)   LMP  (LMP Unknown)   SpO2 99%   BMI 41.57 kg/m     Wt Readings from Last 3 Encounters:  08/17/19 246 lb (111.6 kg)  08/15/19 246 lb 2 oz (111.6 kg)  05/01/19 247 lb 7 oz (112.2 kg)     GEN: Patient is in no acute distress HEENT: Normal NECK: No JVD; No carotid bruits LYMPHATICS: No lymphadenopathy CARDIAC: Hear sounds regular, 2/6 systolic murmur at the apex. RESPIRATORY:  Clear to auscultation without rales, wheezing or rhonchi  ABDOMEN: Soft, non-tender, non-distended MUSCULOSKELETAL:  No edema; No deformity  SKIN: Warm and dry NEUROLOGIC:  Alert and oriented x 3 PSYCHIATRIC:  Normal affect   Signed, Kaylee Lindau, MD  08/17/2019 3:25 PM    [facility]

## 2019-08-17 NOTE — Patient Instructions (Signed)
Medication Instructions:  No medication changes *If you need a refill on your cardiac medications before your next appointment, please call your pharmacy*   Lab Work: None ordered If you have labs (blood work) drawn today and your tests are completely normal, you will receive your results only by: Marland Kitchen MyChart Message (if you have MyChart) OR . A paper copy in the mail If you have any lab test that is abnormal or we need to change your treatment, we will call you to review the results.   Testing/Procedures: Your physician has requested that you have an echocardiogram. Echocardiography is a painless test that uses sound waves to create images of your heart. It provides your doctor with information about the size and shape of your heart and how well your heart's chambers and valves are working. This procedure takes approximately one hour. There are no restrictions for this procedure.     Follow-Up: At [facility], you and your health needs are our priority.  As part of our continuing mission to provide you with exceptional heart care, we have created designated Provider Care Teams.  These Care Teams include your primary Cardiologist (physician) and Advanced Practice Providers (APPs -  Physician Assistants and Nurse Practitioners) who all work together to provide you with the care you need, when you need it.  We recommend signing up for the patient portal called "MyChart".  Sign up information is provided on this After Visit Summary.  MyChart is used to connect with patients for Virtual Visits (Telemedicine).  Patients are able to view lab/test results, encounter notes, upcoming appointments, etc.  Non-urgent messages can be sent to your provider as well.   To learn more about what you can do with MyChart, go to [email or web address].    Your next appointment:   6 month(s)  The format for your next appointment:   In Person  Provider:   Jyl Heinz, MD   Other Instructions NA

## 2019-08-23 ENCOUNTER — Ambulatory Visit (HOSPITAL_BASED_OUTPATIENT_CLINIC_OR_DEPARTMENT_OTHER): Payer: Medicare Other

## 2019-08-27 ENCOUNTER — Ambulatory Visit (HOSPITAL_BASED_OUTPATIENT_CLINIC_OR_DEPARTMENT_OTHER)
Admission: RE | Admit: 2019-08-27 | Discharge: 2019-08-27 | Disposition: A | Discharge status: Home / Self Care | Payer: Medicare Other | Source: Ambulatory Visit | Attending: Cardiology | Admitting: Cardiology

## 2019-08-27 ENCOUNTER — Other Ambulatory Visit: Payer: Self-pay

## 2019-08-27 DIAGNOSIS — R011 Cardiac murmur, unspecified: Secondary | ICD-10-CM | POA: Diagnosis not present

## 2019-08-27 NOTE — Progress Notes (Signed)
  Echocardiogram 2D Echocardiogram has been performed.  [name] 08/27/2019, 3:15 PM

## 2019-08-28 ENCOUNTER — Telehealth: Payer: Self-pay

## 2019-08-28 NOTE — Telephone Encounter (Signed)
-----   Message from Jenean Lindau, MD sent at 08/28/2019  8:14 AM EDT ----- The results of the study is unremarkable. Please inform patient. I will discuss in detail at next appointment. Cc  primary care/referring physician Jenean Lindau, MD 08/28/2019 8:14 AM

## 2019-08-28 NOTE — Telephone Encounter (Signed)
Left message on patients voicemail to please return our call.   

## 2019-09-07 DIAGNOSIS — I2692 Saddle embolus of pulmonary artery without acute cor pulmonale: Secondary | ICD-10-CM | POA: Diagnosis not present

## 2019-09-21 ENCOUNTER — Ambulatory Visit: Payer: Medicare Other | Admitting: Family Medicine

## 2019-09-28 ENCOUNTER — Other Ambulatory Visit: Payer: Self-pay | Admitting: Family Medicine

## 2019-09-28 DIAGNOSIS — J4540 Moderate persistent asthma, uncomplicated: Secondary | ICD-10-CM

## 2019-10-10 ENCOUNTER — Other Ambulatory Visit: Payer: Self-pay

## 2019-10-10 ENCOUNTER — Ambulatory Visit (INDEPENDENT_AMBULATORY_CARE_PROVIDER_SITE_OTHER): Payer: Medicare Other | Admitting: Family Medicine

## 2019-10-10 ENCOUNTER — Encounter: Payer: Self-pay | Admitting: Family Medicine

## 2019-10-10 VITALS — BP 110/74 | HR 78 | Temp 95.7°F | Ht 64.0 in | Wt 250.2 lb

## 2019-10-10 DIAGNOSIS — F411 Generalized anxiety disorder: Secondary | ICD-10-CM

## 2019-10-10 DIAGNOSIS — J4540 Moderate persistent asthma, uncomplicated: Secondary | ICD-10-CM | POA: Diagnosis not present

## 2019-10-10 MED ORDER — ALPRAZOLAM 1 MG PO TABS: 0.5000 mg | ORAL_TABLET | Freq: Every evening | ORAL | 1 refills | Status: DC | PRN

## 2019-10-10 MED ORDER — CITALOPRAM HYDROBROMIDE 40 MG PO TABS: 40.0000 mg | ORAL_TABLET | Freq: Every day | ORAL | 2 refills | Status: DC

## 2019-10-10 MED ORDER — BUDESONIDE-FORMOTEROL FUMARATE 160-4.5 MCG/ACT IN AERO: 2.0000 | INHALATION_SPRAY | Freq: Two times a day (BID) | RESPIRATORY_TRACT | 5 refills | Status: DC

## 2019-10-10 MED ORDER — AMLODIPINE BESYLATE 2.5 MG PO TABS: 2.5000 mg | ORAL_TABLET | Freq: Two times a day (BID) | ORAL | 2 refills | Status: DC

## 2019-10-10 NOTE — Progress Notes (Signed)
Chief Complaint  Patient presents with  . Follow-up    Subjective [name] presents for f/u anxiety/depression.  Pt is currently being treated with Celexa 20 mg/d, recently changed from Zoloft. Her benzo was also changed from Xanax to Restoril.   Reports little improvement since treatment. Would rather go back to prn Xanax.  No thoughts of harming self or others. No self-medication with alcohol, prescription drugs or illicit drugs. Pt is not following with a counselor/psychologist.  Past Medical History:  Diagnosis Date  . Anemia due to antineoplastic chemotherapy 04/16/2015  . Anxiety state 06/01/2007   Qualifier: Diagnosis of  By: Ronnald Ramp RN, [place], [place]    . Asthma   . Asymptomatic proteinuria    Intermittent  . CHF (congestive heart failure) ([place])   . COPD (chronic obstructive pulmonary disease) ([place])   . Crohn's disease without complication ([place]) 61/60/7371  . Depression   . 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  The Custom gene panel offered by [facility] includes sequencing and rearrangement analysis for the following 24 genes:  ATM, BAP1, BARD1, BRCA1, BRCA2, BRIP1, CDH1, CDK4, CDKN2A, CHEK2, EPCAM, FANCC, MITF, MLH1, MSH2, MSH6, NBN, PALB2, PMS2, PTEN, RAD51C, RAD51D, TP53, and XRCC2.   The report date is February 13, 2016.  Marland Kitchen GERD (gastroesophageal reflux disease)   . Heart murmur   . History of melanoma excision 03/23/2015  . Hypertension   . Leukopenia due to antineoplastic chemotherapy ([place]) 04/16/2015  . Morbid obesity with BMI of 50.0-59.9, adult ([place]) 03/23/2015  . OPEN WOUND OF KNEE LEG AND ANKLE COMPLICATED 10/06/9483   Qualifier: Diagnosis of  By: Patsy Baltimore RN, Langley Gauss    . Seasonal allergies   . Umbilical hernia without obstruction and without gangrene 03/23/2015  . Urinary, incontinence, stress female 10/27/2016   Allergies as of 10/10/2019      Reactions   Levofloxacin Palpitations   REACTION: tachycardia    Moxifloxacin Palpitations   REACTION: tachycardia but tolerated cirpofloxacin just fine   Stadol [butorphanol] Other (See Comments)   Caused shakes for 6 weeks "shaking for weeks"   Amoxicillin Rash   Can take 500 mg - amounts greater causes yeast infection REACTION: Rash with high doses. Can take lower doses like 51m   Asa [aspirin] Other (See Comments)   Pt stated the 81 mg caused Exacerbation of her asthma  - unsure about other asprins Asthma exacerbation   Ciprofloxacin Hcl Nausea And Vomiting   Can take lower doses like 5087m Other causes yeast infection Can take lower doses like 50059m  Codeine Nausea Only, Nausea And Vomiting   REACTION: nausea   Effexor [venlafaxine] Other (See Comments)   hyperactivity Panic attacks   Erythromycin Diarrhea, Nausea And Vomiting   Latex Rash   Paroxetine Other (See Comments)   Made nerves worse and caused fatigue   Butorphanol Tartrate    Gave her the shakes for 6 weeks   Cephalosporins    REACTION: she has tolerated rocephin and Keflex without difficulty   Clindamycin/lincomycin Other (See Comments)   Tears stomach up.   Influenza Vaccines    Pt states that she got really sick from flu vaccine was sick a total of 6 weeks   Losartan Other (See Comments)   Headache, nausea, fatigue   Paxil [paroxetine Hcl] Other (See Comments)   fatigue   Clarithromycin Diarrhea   Clindamycin Diarrhea   Tape Rash      Medication List  Accurate as of October 10, 2019 11:46 AM. If you have any questions, ask your nurse or doctor.        STOP taking these medications   temazepam 15 MG capsule Commonly known as: RESTORIL Stopped by: Shelda Pal, DO     TAKE these medications   ACIDOPHILUS PO Take 2 capsules by mouth daily.   albuterol 108 (90 Base) MCG/ACT inhaler Commonly known as: VENTOLIN HFA Inhale 1-2 puffs into the lungs every 6 (six) hours as needed for wheezing or shortness of breath.   albuterol (2.5 MG/3ML)  0.083% nebulizer solution Commonly known as: PROVENTIL Take 3 mLs (2.5 mg total) by nebulization every 6 (six) hours as needed for wheezing or shortness of breath.   ALPRAZolam 1 MG tablet Commonly known as: XANAX Take 0.5-1 tablets (0.5-1 mg total) by mouth at bedtime as needed for anxiety. Started by: Shelda Pal, DO   amLODipine 2.5 MG tablet Commonly known as: NORVASC Take 1 tablet (2.5 mg total) by mouth 2 (two) times daily.   CALCIUM 1200 PO Take 1 tablet by mouth daily.   citalopram 40 MG tablet Commonly known as: CELEXA Take 1 tablet (40 mg total) by mouth daily. What changed:   medication strength  how much to take Changed by: Shelda Pal, DO   diclofenac Sodium 1 % Gel Commonly known as: Voltaren Apply 2 g topically 4 (four) times daily.   ELDERBERRY PO Take 1,250 mg by mouth daily.   Fish Oil 1000 MG Caps Take 2 capsules by mouth daily. Reported on 06/26/2015   Nebulizers Misc 1 Units by Misc.(Non-Drug; Combo Route) route every 8 (eight) hours as needed.   pantoprazole 40 MG tablet Commonly known as: PROTONIX Take by mouth.   promethazine 25 MG tablet Commonly known as: PHENERGAN Take 1 tablet (25 mg total) by mouth every 6 (six) hours as needed for nausea or vomiting.   Symbicort 160-4.5 MCG/ACT inhaler Generic drug: budesonide-formoterol INHALE 2 PUFFS INTO THE LUNGS TWICE DAILY   budesonide-formoterol 160-4.5 MCG/ACT inhaler Commonly known as: Symbicort Inhale 2 puffs into the lungs 2 (two) times daily.   triamterene-hydrochlorothiazide 75-50 MG tablet Commonly known as: MAXZIDE Take 1 tablet by mouth daily. Reported on 06/12/2015   vitamin C 1000 MG tablet Take 1,000 mg by mouth daily.       Exam BP 110/74 (BP Location: Left Arm, Patient Position: Sitting, Cuff Size: Normal)   Pulse 78   Temp (!) 95.7 F (35.4 C) (Temporal)   Ht 5' 4"  (1.626 m)   Wt 250 lb 4 oz (113.5 kg)   LMP  (LMP Unknown)   SpO2 97%   BMI  42.96 kg/m  General:  well developed, well nourished, in no apparent distress Lungs:  No respiratory distress Psych: well oriented with normal range of affect and age-appropriate judgement/insight, alert and oriented x4.  Assessment and Plan  GAD (generalized anxiety disorder) - Plan: citalopram (CELEXA) 40 MG tablet  Moderate persistent asthma without complication - Plan: budesonide-formoterol (SYMBICORT) 160-4.5 MCG/ACT inhaler  Increase above from 20 mg/d to 40 mg/d. Change back to Xanax prn.  F/u as originally scheduled. The patient voiced understanding and agreement to the plan.  [place], DO 10/10/19 11:46 AM

## 2019-10-10 NOTE — Patient Instructions (Signed)
Keep up the good work.   Let me know if there are issues.

## 2019-10-19 ENCOUNTER — Other Ambulatory Visit: Payer: Self-pay | Admitting: Family Medicine

## 2019-10-19 ENCOUNTER — Telehealth: Payer: Self-pay | Admitting: Family Medicine

## 2019-10-19 MED ORDER — SERTRALINE HCL 50 MG PO TABS: 50.0000 mg | ORAL_TABLET | Freq: Every day | ORAL | 3 refills | Status: DC

## 2019-10-19 NOTE — Telephone Encounter (Signed)
Noted  

## 2019-10-19 NOTE — Telephone Encounter (Signed)
The patient did not increase celexa due to having hair loss and she has gone back on zoloft 50 mg.

## 2019-11-22 ENCOUNTER — Inpatient Hospital Stay: Payer: Medicare Other

## 2019-11-22 ENCOUNTER — Other Ambulatory Visit: Payer: Self-pay

## 2019-11-22 ENCOUNTER — Encounter: Payer: Self-pay | Admitting: Gynecologic Oncology

## 2019-11-22 ENCOUNTER — Inpatient Hospital Stay: Payer: Medicare Other | Attending: Gynecologic Oncology | Admitting: Gynecologic Oncology

## 2019-11-22 VITALS — BP 130/64 | HR 70 | Temp 98.3°F | Resp 18 | Ht 64.0 in | Wt 249.1 lb

## 2019-11-22 DIAGNOSIS — Z90722 Acquired absence of ovaries, bilateral: Secondary | ICD-10-CM | POA: Insufficient documentation

## 2019-11-22 DIAGNOSIS — Z08 Encounter for follow-up examination after completed treatment for malignant neoplasm: Secondary | ICD-10-CM | POA: Insufficient documentation

## 2019-11-22 DIAGNOSIS — Z79899 Other long term (current) drug therapy: Secondary | ICD-10-CM | POA: Insufficient documentation

## 2019-11-22 DIAGNOSIS — Z6841 Body Mass Index (BMI) 40.0 and over, adult: Secondary | ICD-10-CM | POA: Insufficient documentation

## 2019-11-22 DIAGNOSIS — Z8543 Personal history of malignant neoplasm of ovary: Secondary | ICD-10-CM | POA: Diagnosis not present

## 2019-11-22 DIAGNOSIS — K219 Gastro-esophageal reflux disease without esophagitis: Secondary | ICD-10-CM | POA: Diagnosis not present

## 2019-11-22 DIAGNOSIS — F329 Major depressive disorder, single episode, unspecified: Secondary | ICD-10-CM | POA: Diagnosis not present

## 2019-11-22 DIAGNOSIS — C562 Malignant neoplasm of left ovary: Secondary | ICD-10-CM

## 2019-11-22 DIAGNOSIS — Z9221 Personal history of antineoplastic chemotherapy: Secondary | ICD-10-CM | POA: Insufficient documentation

## 2019-11-22 DIAGNOSIS — J449 Chronic obstructive pulmonary disease, unspecified: Secondary | ICD-10-CM | POA: Diagnosis not present

## 2019-11-22 DIAGNOSIS — I110 Hypertensive heart disease with heart failure: Secondary | ICD-10-CM | POA: Diagnosis not present

## 2019-11-22 DIAGNOSIS — Z90710 Acquired absence of both cervix and uterus: Secondary | ICD-10-CM | POA: Diagnosis not present

## 2019-11-22 DIAGNOSIS — K5090 Crohn's disease, unspecified, without complications: Secondary | ICD-10-CM | POA: Insufficient documentation

## 2019-11-22 DIAGNOSIS — Z85820 Personal history of malignant melanoma of skin: Secondary | ICD-10-CM | POA: Insufficient documentation

## 2019-11-22 NOTE — Progress Notes (Signed)
Follow-up Note: Gyn-Onc  Consult was initially requested by Dr. Corinna Capra for the evaluation of [name] 59 y.o. 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  CC:  Chief Complaint  Patient presents with  . Ovarian CA, left ([place])    Follow Up    Assessment/Plan:  Ms. [name]  is a 59 y.o.  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 Adjuvant chemotherapy from 03/26/15-05/29/15.  Complete clinical response of post-treatment imaging from 07/04/15.  She completed less than 3 full cycles of adjuvant chemotherapy with carboplatin and paclitaxel, due to extreme bone marrow toxicity.   She has no evidence of recurrence on exam.  She will see me in 6 months and continue at this frequency until February, 2022.  I discussed my recommendation for her to receive the COVID 19 vaccine and discussed the known safety profile associated with the vaccine.   HPI: [name] is a 59 year old woman who was seen in consultation at the request of Dr Corinna Capra for clear cell ovarian cancer.  The patient has a history of abnormal uterine bleeding/symptomatic fibroids. An US of the pelvis in September 2016 showed a 7cm complex cystic mass seen in the left ovary. A repeat US in October 2016 showed tha the mass had increased to approximately 10cm and was accompanied with normal blood flow and a normal CA 125 (10U/mL on 12/18/14).  She then underwent an ex lap (via Pfannenstiel) TAH, BSO and partial omentectomy on 02/11/15 with Dr Corinna Capra at [facility] in [place]. At the time of surgery, the large cystic mass was appreciated to be densely adherent to the pelvic sidewalls and in the process of removing it, unavoidable rupture occurred. Of note, pelvic washings taken from before the cyst rupture were negative for malignancy. According to the operative note there was complete resection of the cyst with no residual tumor  adhesions or tissues in the pelvis and none identified in the peritoneal cavity. The omentum was grossly normal, and at the time of frozen section revealing malignancy, a sample from the infracolic omentum was taken and was noted to be negative for malignancy.  Final pathology from the surgery revealed clear cell carcinoma of the left ovary. The uterus and contralateral tube and ovary were benign. The omentum was negative for malignancy.  She has done well postoperatively with no major morbidities.  The patient has major medical conditions of morbid obesity (BMI 54 kg/m2), crohn's disease, HTN, osteoporosis.  She was first seen by me on 02/24/2015 and at that consultation we recommended 6 cycles of adjuvant carboplatin and paclitaxel chemotherapy after a pretreatment baseline CT scan.  CT scan of the chest abdomen and pelvis on 03/07/2015 showed node apparent metastatic disease however it did demonstrate some signs of postoperative changes and fluid collections. She went on to commence adjuvant chemotherapy with Dr. Marko Plume with day 1 of cycle 1 dose dense, but the paclitaxel on 03/26/2015. She had multiple dose delays and reductions through the course of her treatment due to neutropenia thrombocytopenia and anemia. She required Granix bone marrow stimulation. Her final chemotherapy dose was day 1 of cycle 3 given on every 16th 2017. As the patient was poorly tolerating treatment the patient and her oncologist myometrial decision to discontinue treatment at that point in time.  A post therapeutic CT of the abdomen and pelvis on 07/04/2015 showed no apparent residual or recurrent GYN malignancy. Lab evaluations on  07/22/2015 showed normalization of her white blood cell count 4.4, hemoglobin was 9.8, and platelet count was 279.  CA 125 on 10/17/15 was normal at 8 and normal at 9.3 on 06/23/16. CT abdo/pelvis on 07/05/16 ordered for persistent left sided abdominal pain showed a "tiny" umbilical hernia but no  evidence of recurrence and no apparent explanation for her left sided abdominal pain. CA 125 on 9.2 on 02/16/17. CA 125 was normal at 8.8 on 05/18/17  She reported abdominal pains in 2019 and these were worked up with a CT scan of the abdomen and pelvis on 5 referral 2020 which revealed no evidence of ovarian carcinoma recurrence of metastases, stable size of pelvic lymph nodes unchanged from comparison CT from 2018.  Mild left colonic diverticulosis without diverticulitis.  She underwent hernia repair - uncomplicated.  CA 125 from 05/16/18 normal at 7.5  She reported abdominal pain in February, 2020 and therefore CT abd/pelvis was performed which revealed no evidence of ovarian cancer recurrence.  There was mild left colonic diverticulosis without diverticulitis present.  CA 125 from 11/06/18 7.2.  CA 125 from 05/01/19 was normal at 7.9  Interval Hx:  She has no symptoms concerning for recurrence.  Colonoscopy in 2021 was normal with recommendation for 5 year follow-up.  She remains unvaccinated against COVID 19 due to anxiety about toxicity.   Current Meds:  Outpatient Encounter Medications as of 11/22/2019  Medication Sig  . albuterol (PROVENTIL) (2.5 MG/3ML) 0.083% nebulizer solution Take 3 mLs (2.5 mg total) by nebulization every 6 (six) hours as needed for wheezing or shortness of breath.  Marland Kitchen albuterol (VENTOLIN HFA) 108 (90 Base) MCG/ACT inhaler Inhale 1-2 puffs into the lungs every 6 (six) hours as needed for wheezing or shortness of breath.  . ALPRAZolam (XANAX) 1 MG tablet Take 0.5-1 tablets (0.5-1 mg total) by mouth at bedtime as needed for anxiety.  Marland Kitchen amLODipine (NORVASC) 2.5 MG tablet Take 1 tablet (2.5 mg total) by mouth 2 (two) times daily.  . Ascorbic Acid (VITAMIN C) 1000 MG tablet Take 1,000 mg by mouth daily.  . budesonide-formoterol (SYMBICORT) 160-4.5 MCG/ACT inhaler Inhale 2 puffs into the lungs 2 (two) times daily.  . Calcium Carbonate-Vit D-Min (CALCIUM 1200 PO) Take 1  tablet by mouth daily.  . diclofenac Sodium (VOLTAREN) 1 % GEL Apply 2 g topically 4 (four) times daily.  Marland Kitchen ELDERBERRY PO Take 1,250 mg by mouth daily.  . Lactobacillus (ACIDOPHILUS PO) Take 2 capsules by mouth daily.   . Nebulizers MISC 1 Units by Misc.(Non-Drug; Combo Route) route every 8 (eight) hours as needed.  . Omega-3 Fatty Acids (FISH OIL) 1000 MG CAPS Take 2 capsules by mouth daily. Reported on 06/26/2015  . promethazine (PHENERGAN) 25 MG tablet Take 1 tablet (25 mg total) by mouth every 6 (six) hours as needed for nausea or vomiting.  . sertraline (ZOLOFT) 50 MG tablet Take 1 tablet (50 mg total) by mouth daily.  . SYMBICORT 160-4.5 MCG/ACT inhaler INHALE 2 PUFFS INTO THE LUNGS TWICE DAILY  . triamterene-hydrochlorothiazide (MAXZIDE) 75-50 MG tablet Take 1 tablet by mouth daily. Reported on 06/12/2015  . VITAMIN E PO Take by mouth daily.  . pantoprazole (PROTONIX) 40 MG tablet Take by mouth.   No facility-administered encounter medications on file as of 11/22/2019.    Allergy:  Allergies  Allergen Reactions  . Levofloxacin Palpitations    REACTION: tachycardia  . Moxifloxacin Palpitations    REACTION: tachycardia but tolerated cirpofloxacin just fine  . Stadol [Butorphanol] Other (  See Comments)    Caused shakes for 6 weeks "shaking for weeks"  . Amoxicillin Rash    Can take 500 mg - amounts greater causes yeast infection REACTION: Rash with high doses. Can take lower doses like 55m  . Asa [Aspirin] Other (See Comments)    Pt stated the 81 mg caused Exacerbation of her asthma  - unsure about other asprins Asthma exacerbation  . Ciprofloxacin Hcl Nausea And Vomiting    Can take lower doses like 5053m Other causes yeast infection Can take lower doses like 50036m . Codeine Nausea Only and Nausea And Vomiting    REACTION: nausea  . Effexor [Venlafaxine] Other (See Comments)    hyperactivity Panic attacks  . Erythromycin Diarrhea and Nausea And Vomiting  . Latex Rash  .  Paroxetine Other (See Comments)    Made nerves worse and caused fatigue  . Butorphanol Tartrate     Gave her the shakes for 6 weeks  . Cephalosporins     REACTION: she has tolerated rocephin and Keflex without difficulty  . Clindamycin/Lincomycin Other (See Comments)    Tears stomach up.  . Influenza Vaccines     Pt states that she got really sick from flu vaccine was sick a total of 6 weeks  . Losartan Other (See Comments)    Headache, nausea, fatigue  . Paxil [Paroxetine Hcl] Other (See Comments)    fatigue  . Clarithromycin Diarrhea  . Clindamycin Diarrhea  . Tape Rash    Social Hx:   Social History   Socioeconomic History  . Marital status: Married    Spouse name: Not on file  . Number of children: 0  . Years of education: Not on file  . Highest education level: Not on file  Occupational History  . Occupation: disabled  Tobacco Use  . Smoking status: Never Smoker  . Smokeless tobacco: Never Used  Vaping Use  . Vaping Use: Never used  Substance and Sexual Activity  . Alcohol use: No  . Drug use: No  . Sexual activity: Not on file  Other Topics Concern  . Not on file  Social History Narrative  . Not on file   Social Determinants of Health   Financial Resource Strain:   . Difficulty of Paying Living Expenses:   Food Insecurity:   . Worried About RunCharity fundraiser the Last Year:   . RanArboriculturist the Last Year:   Transportation Needs:   . LacFilm/video editoredical):   . LMarland Kitchenck of Transportation (Non-Medical):   Physical Activity:   . Days of Exercise per Week:   . Minutes of Exercise per Session:   Stress:   . Feeling of Stress :   Social Connections:   . Frequency of Communication with Friends and Family:   . Frequency of Social Gatherings with Friends and Family:   . Attends Religious Services:   . Active Member of Clubs or Organizations:   . Attends CluArchivistetings:   . MMarland Kitchenrital Status:   Intimate Partner Violence:   .  Fear of Current or Ex-Partner:   . Emotionally Abused:   . PMarland Kitchenysically Abused:   . Sexually Abused:     Past Surgical Hx:  Past Surgical History:  Procedure Laterality Date  . ABDOMINAL HYSTERECTOMY Bilateral 02/11/2015   Procedure: HYSTERECTOMY ABDOMINAL, bilateral salpingo-oophorectomy;  Surgeon: DavLouretta ShortenD;  Location: [facility];  Service: Gynecology;  Laterality: Bilateral;  . COLONOSCOPY WITH ESOPHAGOGASTRODUODENOSCOPY (EGD)    .  DILATATION & CURETTAGE/HYSTEROSCOPY WITH TRUECLEAR N/A 08/17/2013   Procedure: DILATATION & CURETTAGE/HYSTEROSCOPY WITH TRUCLEAR;  Surgeon: Luz Lex, MD;  Location: [facility];  Service: Gynecology;  Laterality: N/A;  . IR REMOVAL TUN ACCESS W/ PORT W/O FL MOD SED  07/05/2017  . MELANOMA EXCISION  06/2001   right leg; also removed 2 lymph nodes  . OMENTECTOMY N/A 02/11/2015   Procedure: OMENTECTOMY;  Surgeon: Louretta Shorten, MD;  Location: [facility];  Service: Gynecology;  Laterality: N/A;  . UMBILICAL HERNIA REPAIR      Past Medical Hx:  Past Medical History:  Diagnosis Date  . Anemia due to antineoplastic chemotherapy 04/16/2015  . Anxiety state 06/01/2007   Qualifier: Diagnosis of  By: Ronnald Ramp RN, [place], [place]    . Asthma   . Asymptomatic proteinuria    Intermittent  . CHF (congestive heart failure) ([place])   . COPD (chronic obstructive pulmonary disease) ([place])   . Crohn's disease without complication ([place]) 63/89/3734  . Depression   . 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  The Custom gene panel offered by [facility] includes sequencing and rearrangement analysis for the following 24 genes:  ATM, BAP1, BARD1, BRCA1, BRCA2, BRIP1, CDH1, CDK4, CDKN2A, CHEK2, EPCAM, FANCC, MITF, MLH1, MSH2, MSH6, NBN, PALB2, PMS2, PTEN, RAD51C, RAD51D, TP53, and XRCC2.   The report date is February 13, 2016.  Marland Kitchen GERD (gastroesophageal reflux disease)   . Heart murmur   . History of melanoma excision 03/23/2015  . Hypertension   . Leukopenia  due to antineoplastic chemotherapy ([place]) 04/16/2015  . Morbid obesity with BMI of 50.0-59.9, adult ([place]) 03/23/2015  . OPEN WOUND OF KNEE LEG AND ANKLE COMPLICATED 28/76/8115   Qualifier: Diagnosis of  By: Patsy Baltimore RN, Langley Gauss    . Seasonal allergies   . Umbilical hernia without obstruction and without gangrene 03/23/2015  . Urinary, incontinence, stress female 10/27/2016    Past Gynecological History:  See HPI  No LMP recorded (lmp unknown). Patient has had a hysterectomy.  Family Hx:  Family History  Problem Relation Age of Onset  . Colon polyps Father   . Diabetes Father   . Heart disease Paternal Uncle   . Asthma Mother   . Melanoma Maternal Aunt   . Diabetes Paternal Grandmother   . Breast cancer Cousin        maternal first cousin  . Colon cancer Neg Hx     Review of Systems:  Constitutional  Feels well,    ENT Normal appearing ears and nares bilaterally Skin/Breast  No rash, sores, jaundice, itching, dryness Cardiovascular  + SOB and edema Pulmonary  +cough  Gastro Intestinal  No nausea, vomitting, or diarrhoea. + bright red blood per rectum, no abdominal pain, change in bowel movement, or constipation.  Genito Urinary  No frequency, urgency, dysuria, see HPI + stress urinary incontinence Musculo Skeletal  No myalgia, arthralgia, joint swelling or pain  Neurologic  No weakness, numbness, change in gait,  Psychology  No depression, anxiety, insomnia.   Vitals:  Blood pressure 130/64, pulse 70, temperature 98.3 F (36.8 C), temperature source Oral, resp. rate 18, height 5' 4"  (1.626 m), weight 249 lb 2 oz (113 kg), SpO2 98 %.  Physical Exam: WD in NAD Neck  Supple NROM, without any enlargements.  Lymph Node Survey No cervical supraclavicular or inguinal adenopathy Cardiovascular  Pulse normal rate, regularity and rhythm. S1 and S2 normal.  Lungs  Clear to auscultation bilateraly, without wheezes/crackles/rhonchi.  Good air movement.  Skin  No  rash/lesions/breakdown  Psychiatry  Alert and oriented to person, place, and time  Abdomen  Normoactive bowel sounds, abdomen soft, non-tender and obese. Well healed low transverse incision.  Hernia repaired.  Back No CVA tenderness Genito Urinary  Vulva/vagina: Normal external female genitalia.  No lesions. No discharge or bleeding.  Bladder/urethra:  No lesions or masses, well supported bladder  Vagina: grossly normal with in tact cuff.  Cervix: surgically absent  Uterus: surgically absent    Adnexa: no palpable masses. Rectal  Good tone, no masses no cul de sac nodularity.  Extremities  No bilateral cyanosis, clubbing or edema.   Thereasa Solo, MD  11/22/2019, 2:15 PM

## 2019-11-22 NOTE — Patient Instructions (Addendum)
Please notify Dr Denman George at phone number [phone] if you notice vaginal bleeding, new pelvic or abdominal pains, bloating, feeling full easy, or a change in bladder or bowel function.   Please contact Dr Serita Grit office (at [phone]) in October, 2021 to request an appointment with her for February, 2022.  After that point in time, should your testing remain negative, you will be discharged from surveillance care with Dr Denman George.  Dr Denman George recommends the following OBGYN's in [facility] for ongoing general gynecology care: Dr Bobbye Charleston - (ph) [phone] Dr Paula Compton - (ph) [phone]

## 2019-11-23 ENCOUNTER — Telehealth: Payer: Self-pay

## 2019-11-23 LAB — CA 125: Cancer Antigen (CA) 125: 9.1 U/mL (ref 0.0–38.1)

## 2019-11-23 NOTE — Telephone Encounter (Signed)
LM for [name] that the CA-125 was stable and WNL at 9.1 per Joylene John, NP. Kaylee Pinheiro can call the office if she has any questions or concerns at [phone]

## 2019-12-13 DIAGNOSIS — I129 Hypertensive chronic kidney disease with stage 1 through stage 4 chronic kidney disease, or unspecified chronic kidney disease: Secondary | ICD-10-CM | POA: Diagnosis not present

## 2019-12-13 DIAGNOSIS — N182 Chronic kidney disease, stage 2 (mild): Secondary | ICD-10-CM | POA: Diagnosis not present

## 2019-12-13 DIAGNOSIS — N189 Chronic kidney disease, unspecified: Secondary | ICD-10-CM | POA: Diagnosis not present

## 2019-12-13 DIAGNOSIS — D631 Anemia in chronic kidney disease: Secondary | ICD-10-CM | POA: Diagnosis not present

## 2019-12-13 DIAGNOSIS — I509 Heart failure, unspecified: Secondary | ICD-10-CM | POA: Diagnosis not present

## 2019-12-24 ENCOUNTER — Encounter: Payer: Medicare Other | Admitting: Family Medicine

## 2019-12-28 DIAGNOSIS — I129 Hypertensive chronic kidney disease with stage 1 through stage 4 chronic kidney disease, or unspecified chronic kidney disease: Secondary | ICD-10-CM | POA: Diagnosis not present

## 2019-12-28 DIAGNOSIS — N182 Chronic kidney disease, stage 2 (mild): Secondary | ICD-10-CM | POA: Diagnosis not present

## 2020-01-15 ENCOUNTER — Encounter: Payer: Self-pay | Admitting: Family Medicine

## 2020-01-15 ENCOUNTER — Ambulatory Visit (INDEPENDENT_AMBULATORY_CARE_PROVIDER_SITE_OTHER): Payer: Medicare Other | Admitting: Family Medicine

## 2020-01-15 ENCOUNTER — Other Ambulatory Visit: Payer: Self-pay

## 2020-01-15 VITALS — BP 132/82 | HR 78 | Temp 98.2°F | Ht 64.0 in | Wt 251.2 lb

## 2020-01-15 DIAGNOSIS — Z0000 Encounter for general adult medical examination without abnormal findings: Secondary | ICD-10-CM

## 2020-01-15 DIAGNOSIS — I10 Essential (primary) hypertension: Secondary | ICD-10-CM

## 2020-01-15 DIAGNOSIS — J4540 Moderate persistent asthma, uncomplicated: Secondary | ICD-10-CM | POA: Diagnosis not present

## 2020-01-15 MED ORDER — SERTRALINE HCL 50 MG PO TABS: 50.0000 mg | ORAL_TABLET | Freq: Every day | ORAL | 3 refills | Status: DC

## 2020-01-15 MED ORDER — TRIAMTERENE-HCTZ 75-50 MG PO TABS: 1.0000 | ORAL_TABLET | Freq: Every day | ORAL | 3 refills | Status: DC

## 2020-01-15 MED ORDER — BUDESONIDE-FORMOTEROL FUMARATE 160-4.5 MCG/ACT IN AERO: 2.0000 | INHALATION_SPRAY | Freq: Two times a day (BID) | RESPIRATORY_TRACT | 5 refills | Status: DC

## 2020-01-15 MED ORDER — LEVOCETIRIZINE DIHYDROCHLORIDE 5 MG PO TABS
5.0000 mg | ORAL_TABLET | Freq: Every evening | ORAL | 2 refills | Status: DC
Start: 2020-01-15 — End: 2020-11-11

## 2020-01-15 NOTE — Patient Instructions (Addendum)
Give [place] 2-3 business days to get the results of your labs back.   Keep the diet clean and stay active.  Goal weight at our follow up in 6 months: 240 lbs  Let me know if you change your mind regarding the flu shot.  The new Shingrix vaccine (for shingles) is a 2 shot series. It can make people feel low energy, achy and almost like they have the flu for 48 hours after injection. Please plan accordingly when deciding on when to get this shot. Call the pharmacy for an appointment to get this. The second shot of the series is less severe regarding the side effects, but it still lasts 48 hours.   Call Center for [place] at [facility] at [phone] for an appointment.  They are located at [address], [place], [place], [address] (right across the hall from our office).   Let us know if you need anything.

## 2020-01-15 NOTE — Progress Notes (Signed)
Chief Complaint  Patient presents with  . Annual Exam     Well Woman [name] is here for a complete physical.   Her last physical was >1 year ago.  Current diet: in general, diet could be better.  Current exercise: walking, some lifting. Weight is stable and she denies fatigue out of ordinary. Seatbelt? Yes  Health Maintenance Mammogram- Yes Colon cancer screening-Yes Shingrix- No Tetanus- Yes Hep C screening- Yes HIV screening- Yes  Past Medical History:  Diagnosis Date  . Anemia due to antineoplastic chemotherapy 04/16/2015  . Anxiety state 06/01/2007   Qualifier: Diagnosis of  By: Ronnald Ramp RN, [place], [place]    . Asthma   . Asymptomatic proteinuria    Intermittent  . CHF (congestive heart failure) ([place])   . COPD (chronic obstructive pulmonary disease) ([place])   . Crohn's disease without complication ([place]) 50/06/7046  . Depression   . 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  The Custom gene panel offered by [facility] includes sequencing and rearrangement analysis for the following 24 genes:  ATM, BAP1, BARD1, BRCA1, BRCA2, BRIP1, CDH1, CDK4, CDKN2A, CHEK2, EPCAM, FANCC, MITF, MLH1, MSH2, MSH6, NBN, PALB2, PMS2, PTEN, RAD51C, RAD51D, TP53, and XRCC2.   The report date is February 13, 2016.  Marland Kitchen GERD (gastroesophageal reflux disease)   . Heart murmur   . History of melanoma excision 03/23/2015  . Hypertension   . Leukopenia due to antineoplastic chemotherapy ([place]) 04/16/2015  . Morbid obesity with BMI of 50.0-59.9, adult ([place]) 03/23/2015  . OPEN WOUND OF KNEE LEG AND ANKLE COMPLICATED 88/91/6945   Qualifier: Diagnosis of  By: Patsy Baltimore RN, Langley Gauss    . Seasonal allergies   . Umbilical hernia without obstruction and without gangrene 03/23/2015  . Urinary, incontinence, stress female 10/27/2016     Past Surgical History:  Procedure Laterality Date  . ABDOMINAL HYSTERECTOMY Bilateral 02/11/2015   Procedure: HYSTERECTOMY ABDOMINAL,  bilateral salpingo-oophorectomy;  Surgeon: Louretta Shorten, MD;  Location: [facility];  Service: Gynecology;  Laterality: Bilateral;  . COLONOSCOPY WITH ESOPHAGOGASTRODUODENOSCOPY (EGD)    . DILATATION & CURETTAGE/HYSTEROSCOPY WITH TRUECLEAR N/A 08/17/2013   Procedure: DILATATION & CURETTAGE/HYSTEROSCOPY WITH TRUCLEAR;  Surgeon: Luz Lex, MD;  Location: [facility];  Service: Gynecology;  Laterality: N/A;  . IR REMOVAL TUN ACCESS W/ PORT W/O FL MOD SED  07/05/2017  . MELANOMA EXCISION  06/2001   right leg; also removed 2 lymph nodes  . OMENTECTOMY N/A 02/11/2015   Procedure: OMENTECTOMY;  Surgeon: Louretta Shorten, MD;  Location: [facility];  Service: Gynecology;  Laterality: N/A;  . UMBILICAL HERNIA REPAIR      Medications  Current Outpatient Medications on File Prior to Visit  Medication Sig Dispense Refill  . albuterol (PROVENTIL) (2.5 MG/3ML) 0.083% nebulizer solution Take 3 mLs (2.5 mg total) by nebulization every 6 (six) hours as needed for wheezing or shortness of breath. 150 mL 1  . albuterol (VENTOLIN HFA) 108 (90 Base) MCG/ACT inhaler Inhale 1-2 puffs into the lungs every 6 (six) hours as needed for wheezing or shortness of breath. 18 g 2  . ALPRAZolam (XANAX) 1 MG tablet Take 0.5-1 tablets (0.5-1 mg total) by mouth at bedtime as needed for anxiety. 45 tablet 1  . amLODipine (NORVASC) 2.5 MG tablet Take 1 tablet (2.5 mg total) by mouth 2 (two) times daily. 180 tablet 2  . Ascorbic Acid (VITAMIN C) 1000 MG tablet Take 1,000 mg by mouth daily.    . budesonide-formoterol (  SYMBICORT) 160-4.5 MCG/ACT inhaler Inhale 2 puffs into the lungs 2 (two) times daily. 10.2 g 5  . Calcium Carbonate-Vit D-Min (CALCIUM 1200 PO) Take 1 tablet by mouth daily.    . diclofenac Sodium (VOLTAREN) 1 % GEL Apply 2 g topically 4 (four) times daily. 100 g 2  . ELDERBERRY PO Take 1,250 mg by mouth daily.    . Lactobacillus (ACIDOPHILUS PO) Take 2 capsules by mouth daily.     . Nebulizers MISC 1 Units by Misc.(Non-Drug; Combo Route)  route every 8 (eight) hours as needed.    . Omega-3 Fatty Acids (FISH OIL) 1000 MG CAPS Take 2 capsules by mouth daily. Reported on 06/26/2015    . promethazine (PHENERGAN) 25 MG tablet Take 1 tablet (25 mg total) by mouth every 6 (six) hours as needed for nausea or vomiting. 90 tablet 1  . sertraline (ZOLOFT) 50 MG tablet Take 1 tablet (50 mg total) by mouth daily. 30 tablet 3  . SYMBICORT 160-4.5 MCG/ACT inhaler INHALE 2 PUFFS INTO THE LUNGS TWICE DAILY 10.2 g 5  . triamterene-hydrochlorothiazide (MAXZIDE) 75-50 MG tablet Take 1 tablet by mouth daily. Reported on 06/12/2015 90 tablet 2  . VITAMIN E PO Take by mouth daily.    . pantoprazole (PROTONIX) 40 MG tablet Take by mouth.      Allergies Allergies  Allergen Reactions  . Levofloxacin Palpitations    REACTION: tachycardia  . Moxifloxacin Palpitations    REACTION: tachycardia but tolerated cirpofloxacin just fine  . Stadol [Butorphanol] Other (See Comments)    Caused shakes for 6 weeks "shaking for weeks"  . Amoxicillin Rash    Can take 500 mg - amounts greater causes yeast infection REACTION: Rash with high doses. Can take lower doses like 575m  . Asa [Aspirin] Other (See Comments)    Pt stated the 81 mg caused Exacerbation of her asthma  - unsure about other asprins Asthma exacerbation  . Ciprofloxacin Hcl Nausea And Vomiting    Can take lower doses like 504m Other causes yeast infection Can take lower doses like 50063m . Codeine Nausea Only and Nausea And Vomiting    REACTION: nausea  . Effexor [Venlafaxine] Other (See Comments)    hyperactivity Panic attacks  . Erythromycin Diarrhea and Nausea And Vomiting  . Latex Rash  . Paroxetine Other (See Comments)    Made nerves worse and caused fatigue  . Butorphanol Tartrate     Gave her the shakes for 6 weeks  . Cephalosporins     REACTION: she has tolerated rocephin and Keflex without difficulty  . Clindamycin/Lincomycin Other (See Comments)    Tears stomach up.  .  Influenza Vaccines     Pt states that she got really sick from flu vaccine was sick a total of 6 weeks  . Losartan Other (See Comments)    Headache, nausea, fatigue  . Paxil [Paroxetine Hcl] Other (See Comments)    fatigue  . Clarithromycin Diarrhea  . Clindamycin Diarrhea  . Tape Rash    Review of Systems: Constitutional:  no unexpected weight changes Eye:  no recent significant change in vision Ear/Nose/Mouth/Throat:  Ears:  no recent change in hearing Nose/Mouth/Throat:  no complaints of nasal congestion, no sore throat Cardiovascular: no chest pain Respiratory:  no shortness of breath Gastrointestinal:  no abdominal pain, no change in bowel habits GU:  Female: negative for dysuria or pelvic pain Musculoskeletal/Extremities:  no pain of the joints Integumentary (Skin/Breast): +itchy rash on face, arm and stomach Neurologic:  no headaches Endocrine:  denies fatigue  Exam BP 132/82 (BP Location: Left Arm, Patient Position: Sitting, Cuff Size: Large)   Pulse 78   Temp 98.2 F (36.8 C) (Oral)   Ht 5' 4"  (1.626 m)   Wt 251 lb 4 oz (114 kg)   LMP  (LMP Unknown)   SpO2 96%   BMI 43.13 kg/m  General:  well developed, well nourished, in no apparent distress Skin: excoriated lesions on L post forearm and L upper abd region; otherwise no significant moles, warts, or growths Head:  no masses, lesions, or tenderness Eyes:  pupils equal and round, sclera anicteric without injection Ears:  canals without lesions, TMs shiny without retraction, no obvious effusion, no erythema Nose:  nares patent, septum midline, mucosa normal, and no drainage or sinus tenderness Throat/Pharynx:  lips and gingiva without lesion; tongue and uvula midline; non-inflamed pharynx; no exudates or postnasal drainage Neck: neck supple without adenopathy, thyromegaly, or masses Lungs:  clear to auscultation, breath sounds equal bilaterally, no respiratory distress Cardio:  regular rate and rhythm, no LE  edema Abdomen:  abdomen soft, nontender; bowel sounds normal; no masses or organomegaly Genital: Defer to GYN Musculoskeletal:  symmetrical muscle groups noted without atrophy or deformity Extremities:  no clubbing, cyanosis, or edema, no deformities, no skin discoloration Neuro:  gait normal; deep tendon reflexes normal and symmetric Psych: well oriented with normal range of affect and appropriate judgment/insight  Assessment and Plan  Well adult exam  Morbid obesity ([place])  Essential hypertension - Plan: Lipid panel, Comprehensive metabolic panel   Well 59 y.o. female. Counseled on diet and exercise. Other orders as above. Refused flu shot.  Goal wt in 6 mo 235-240 lbs.  Follow up 6 weeks for nurse visit and 6 mo for med ck. The patient voiced understanding and agreement to the plan.  [place], DO 01/15/20 1:47 PM

## 2020-01-15 NOTE — Addendum Note (Signed)
Addended by: Sharon Seller B on: 01/15/2020 01:55 PM   Modules accepted: Orders

## 2020-01-16 LAB — COMPREHENSIVE METABOLIC PANEL
AG Ratio: 0.7 (calc) — ABNORMAL LOW (ref 1.0–2.5)
ALT: 17 U/L (ref 6–29)
AST: 24 U/L (ref 10–35)
Albumin: 3.4 g/dL — ABNORMAL LOW (ref 3.6–5.1)
Alkaline phosphatase (APISO): 76 U/L (ref 37–153)
BUN: 20 mg/dL (ref 7–25)
CO2: 26 mmol/L (ref 20–32)
Calcium: 9.2 mg/dL (ref 8.6–10.4)
Chloride: 101 mmol/L (ref 98–110)
Creat: 0.9 mg/dL (ref 0.50–1.05)
Globulin: 5.1 g/dL (calc) — ABNORMAL HIGH (ref 1.9–3.7)
Glucose, Bld: 87 mg/dL (ref 65–99)
Potassium: 4.4 mmol/L (ref 3.5–5.3)
Sodium: 135 mmol/L (ref 135–146)
Total Bilirubin: 0.7 mg/dL (ref 0.2–1.2)
Total Protein: 8.5 g/dL — ABNORMAL HIGH (ref 6.1–8.1)

## 2020-01-16 LAB — LIPID PANEL
Cholesterol: 143 mg/dL (ref <200)
HDL: 44 mg/dL — ABNORMAL LOW (ref >=50)
LDL Cholesterol (Calc): 84 mg/dL (calc)
Non-HDL Cholesterol (Calc): 99 mg/dL (calc) (ref <130)
Total CHOL/HDL Ratio: 3.3 (calc) (ref <5.0)
Triglycerides: 71 mg/dL (ref <150)

## 2020-01-23 ENCOUNTER — Telehealth: Payer: Self-pay | Admitting: Family Medicine

## 2020-01-23 MED ORDER — LORATADINE 10 MG PO TABS
10.0000 mg | ORAL_TABLET | Freq: Every day | ORAL | 3 refills | Status: DC
Start: 2020-01-23 — End: 2020-11-11

## 2020-01-23 NOTE — Addendum Note (Signed)
Addended by: Sharon Seller B on: 01/23/2020 12:33 PM   Modules accepted: Orders

## 2020-01-23 NOTE — Telephone Encounter (Signed)
Sent in

## 2020-01-23 NOTE — Telephone Encounter (Signed)
That's fine

## 2020-01-23 NOTE — Telephone Encounter (Signed)
The patient would like generic Claritin sent in for her allergies to [facility] day supply.(is not on her list) Recently sent in xyzal  and is not working.

## 2020-01-31 ENCOUNTER — Ambulatory Visit: Payer: Medicare Other

## 2020-02-22 ENCOUNTER — Telehealth: Payer: Self-pay | Admitting: Family Medicine

## 2020-02-22 NOTE — Telephone Encounter (Signed)
CallerCerena Baine  Call Back # [phone]  Patient states she would like a referral to [facility], patient is requesting a bone density test , last bone density was 05/10/2018.  [id number], [place], [place] [address] Hours:  Open ? Closes 5:30PM     contact number : [phone]

## 2020-02-26 ENCOUNTER — Ambulatory Visit: Payer: Medicare Other

## 2020-02-26 NOTE — Telephone Encounter (Signed)
We can do it, but insurance may not cover it as her last scan was normal. I wouldn't recheck until she is 48 unless something new happened. Ty.

## 2020-02-26 NOTE — Telephone Encounter (Signed)
Called the patient informed of PCP instructions. She will call the insurance company and check with them,will call us back to order if they agree to pay for.

## 2020-03-04 ENCOUNTER — Ambulatory Visit (INDEPENDENT_AMBULATORY_CARE_PROVIDER_SITE_OTHER): Payer: Medicare Other

## 2020-03-04 ENCOUNTER — Other Ambulatory Visit: Payer: Self-pay

## 2020-03-04 VITALS — BP 126/80 | HR 67 | Wt 253.0 lb

## 2020-03-04 DIAGNOSIS — I10 Essential (primary) hypertension: Secondary | ICD-10-CM | POA: Diagnosis not present

## 2020-03-04 NOTE — Progress Notes (Signed)
Pt here for Blood pressure check per Dr. Nani Ravens  Pt currently takes: losartan 25 mg and Maxzide 75-50 mg   Pt reports compliance with medication.  BP today @ = 126/80 HR =67 Weight 253lb

## 2020-04-03 ENCOUNTER — Other Ambulatory Visit: Payer: Self-pay

## 2020-04-03 ENCOUNTER — Encounter: Payer: Self-pay | Admitting: Family Medicine

## 2020-04-03 ENCOUNTER — Telehealth (INDEPENDENT_AMBULATORY_CARE_PROVIDER_SITE_OTHER): Payer: Medicare Other | Admitting: Family Medicine

## 2020-04-03 DIAGNOSIS — J209 Acute bronchitis, unspecified: Secondary | ICD-10-CM

## 2020-04-03 MED ORDER — DOXYCYCLINE HYCLATE 100 MG PO TABS: 100.0000 mg | ORAL_TABLET | Freq: Two times a day (BID) | ORAL | 0 refills | Status: AC

## 2020-04-03 MED ORDER — METHYLPREDNISOLONE 4 MG PO TBPK: ORAL_TABLET | ORAL | 0 refills | Status: DC

## 2020-04-03 NOTE — Progress Notes (Signed)
Chief Complaint  °Patient presents with  °• Cough  °  Onset: 1 week, colored mucus,   ° ° °[name] here for URI complaints. Due to COVID-19 pandemic, we are interacting via web portal for an electronic face-to-face visit. I verified patient's ID using 2 identifiers. Patient agreed to proceed with visit via this method. Patient is at home, I am at office. Patient and I are present for visit.  ° °Duration: 1 week  °Associated symptoms: nasal congestion, wheezing, shortness of breath and productive cough °Denies: sinus pain, rhinorrhea, itchy watery eyes, ear pain, ear drainage, sore throat, myalgia, and fevers °Treatment to date: neb tx's °Sick contacts: No ° °Past Medical History:  °Diagnosis Date  °• Anemia due to antineoplastic chemotherapy 04/16/2015  °• Anxiety state 06/01/2007  ° Qualifier: Diagnosis of  By: Jones RN, CGRN, Sheri    °• Asthma   °• Asymptomatic proteinuria   ° Intermittent  °• CHF (congestive heart failure) (H[facility])   °• COPD (chronic obstructive pulmonary disease) (H[facility])   °• Crohn's disease without complication ([facility]) 03/23/2015  °• Depression   °• Genetic testing 02/17/2016  ° 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  The Custom gene panel offered by [facility] includes sequencing and rearrangement analysis for the following 24 genes:  ATM, BAP1, BARD1, BRCA1, BRCA2, BRIP1, CDH1, CDK4, CDKN2A, CHEK2, EPCAM, FANCC, MITF, MLH1, MSH2, MSH6, NBN, PALB2, PMS2, PTEN, RAD51C, RAD51D, TP53, and XRCC2.   The report date is February 13, 2016.  °• GERD (gastroesophageal reflux disease)   °• Heart murmur   °• History of melanoma excision 03/23/2015  °• Hypertension   °• Leukopenia due to antineoplastic chemotherapy ([facility]) 04/16/2015  °• Morbid obesity with BMI of 50.0-59.9, adult ([facility]) 03/23/2015  °• OPEN WOUND OF KNEE LEG AND ANKLE COMPLICATED 03/10/2009  ° Qualifier: Diagnosis of  By: Estridge RN, Denise    °• Seasonal allergies   °• Umbilical hernia without obstruction and without gangrene  03/23/2015  °• Urinary, incontinence, stress female 10/27/2016  ° °Exam °No conversational dyspnea °Age appropriate judgment and insight °Nml affect and mood ° °Acute wheezy bronchitis - Plan: methylPREDNISolone (MEDROL DOSEPAK) 4 MG TBPK tablet ° °Medrol Dosepak, if no better in 2 d will take 7 d course of doxy. °Continue to push fluids, practice good hand hygiene, cover mouth when coughing. °F/u prn. If starting to experience fevers, shaking, or shortness of breath, seek immediate care. °Pt voiced understanding and agreement to the plan. ° ° Paul , DO °04/03/20 °10:00 AM ° °

## 2020-04-05 DIAGNOSIS — J209 Acute bronchitis, unspecified: Secondary | ICD-10-CM | POA: Diagnosis not present

## 2020-04-05 DIAGNOSIS — Z20822 Contact with and (suspected) exposure to covid-19: Secondary | ICD-10-CM | POA: Diagnosis not present

## 2020-04-09 ENCOUNTER — Telehealth: Payer: Self-pay | Admitting: Family Medicine

## 2020-04-09 DIAGNOSIS — J40 Bronchitis, not specified as acute or chronic: Secondary | ICD-10-CM

## 2020-04-09 NOTE — Telephone Encounter (Signed)
Patient is not feeling the best and would like refills on this medication . Cough is still there.     Medication: doxycycline (VIBRA-TABS) 100 MG tablet [[id number]]   methylPREDNISolone (MEDROL DOSEPAK) 4 MG TBPK tablet   Has the patient contacted their pharmacy? No. (If no, request that the patient contact the pharmacy for the refill.) (If yes, when and what did the pharmacy advise?)  Preferred Pharmacy (with phone number or street name): [facility], [place] - [address]  [address], [place] [address]  Phone:  [phone] Fax:  [phone]  DEA #:  [id number]  Agent: Please be advised that RX refills may take up to 3 business days. We ask that you follow-up with your pharmacy.

## 2020-04-09 NOTE — Telephone Encounter (Signed)
She should still have a few days of the antibiotic left. I expect full resolution of this illness if diagnosis is correct, which over a video visit is not as certain. I want to see her if she is still having issues. Ty.

## 2020-04-09 NOTE — Telephone Encounter (Signed)
Please advise 

## 2020-04-10 ENCOUNTER — Other Ambulatory Visit: Payer: Self-pay

## 2020-04-10 ENCOUNTER — Ambulatory Visit (HOSPITAL_BASED_OUTPATIENT_CLINIC_OR_DEPARTMENT_OTHER)
Admission: RE | Admit: 2020-04-10 | Discharge: 2020-04-10 | Disposition: A | Discharge status: Home / Self Care | Payer: Medicare Other | Source: Ambulatory Visit | Attending: Family Medicine | Admitting: Family Medicine

## 2020-04-10 DIAGNOSIS — J40 Bronchitis, not specified as acute or chronic: Secondary | ICD-10-CM | POA: Diagnosis not present

## 2020-04-10 DIAGNOSIS — K449 Diaphragmatic hernia without obstruction or gangrene: Secondary | ICD-10-CM | POA: Diagnosis not present

## 2020-04-10 DIAGNOSIS — R059 Cough, unspecified: Secondary | ICD-10-CM | POA: Diagnosis not present

## 2020-04-10 NOTE — Telephone Encounter (Signed)
Spoke w/ Pt- informed that Dr. Nani Ravens has agreed to chest x-ray, but to continue abx. She will come by [facility] today for chest x-ray.

## 2020-04-10 NOTE — Telephone Encounter (Signed)
Spoke w/ Pt- informed of recommendations. She is worried about having pneumonia- she would like a chest x-ray ordered. She does have some abx left but completely out of the steroid- she is mainly also requesting a refill for that because she has been sick for almost 3 weeks. Informed PCP out of office but I'd send him a message to see if he will order x-ray. Pt verbalized understanding.

## 2020-04-10 NOTE — Telephone Encounter (Signed)
PT is calling in reference to xray results, patient states she would like a call back.

## 2020-04-10 NOTE — Telephone Encounter (Signed)
That was on the list of possibilities. Doxycycline is a first line treatment for pneumonia. We can order if it would give her peace of mind and save an ER/UC visit. Ty.

## 2020-04-14 ENCOUNTER — Telehealth: Payer: Self-pay | Admitting: Family Medicine

## 2020-04-14 NOTE — Telephone Encounter (Signed)
Pt would like a return call  for her chest xray result  [phone]

## 2020-04-14 NOTE — Telephone Encounter (Signed)
Pt notified of xray results..   Patient wants to know if she had any signs of pneumonia and wants to know if a steroid will be helpful as well

## 2020-04-15 NOTE — Telephone Encounter (Signed)
Patient informed of PCP instructions. She states her appt on 04/03/20 was a virtual--she completed the medrol dose pack and antibiotic  her only symptoms for now are weakness after being up for a while, no cough. She is ok to wait this out.

## 2020-04-15 NOTE — Telephone Encounter (Signed)
No PNA. I gave her a steroid on 12/23. This is likely a virus than can take up to 3 weeks to improve, unfortunately. Ty.

## 2020-05-07 ENCOUNTER — Other Ambulatory Visit: Payer: Self-pay

## 2020-05-07 ENCOUNTER — Ambulatory Visit (INDEPENDENT_AMBULATORY_CARE_PROVIDER_SITE_OTHER): Payer: Medicare Other

## 2020-05-07 ENCOUNTER — Other Ambulatory Visit: Payer: Self-pay | Admitting: Family Medicine

## 2020-05-07 DIAGNOSIS — J4540 Moderate persistent asthma, uncomplicated: Secondary | ICD-10-CM

## 2020-05-07 DIAGNOSIS — Z23 Encounter for immunization: Secondary | ICD-10-CM

## 2020-05-07 MED ORDER — BUDESONIDE-FORMOTEROL FUMARATE 160-4.5 MCG/ACT IN AERO: 2.0000 | INHALATION_SPRAY | Freq: Two times a day (BID) | RESPIRATORY_TRACT | 5 refills | Status: DC

## 2020-05-07 MED ORDER — LOSARTAN POTASSIUM 25 MG PO TABS
25.0000 mg | ORAL_TABLET | Freq: Every day | ORAL | 1 refills | Status: DC
Start: 2020-05-07 — End: 2020-05-13

## 2020-05-07 MED ORDER — SERTRALINE HCL 50 MG PO TABS: 50.0000 mg | ORAL_TABLET | Freq: Every day | ORAL | 3 refills | Status: DC

## 2020-05-07 MED ORDER — TRIAMTERENE-HCTZ 75-50 MG PO TABS
1.0000 | ORAL_TABLET | Freq: Every day | ORAL | 3 refills | Status: DC
Start: 2020-05-07 — End: 2020-05-13

## 2020-05-07 MED ORDER — ALPRAZOLAM 1 MG PO TABS: 0.5000 mg | ORAL_TABLET | Freq: Every evening | ORAL | 1 refills | Status: DC | PRN

## 2020-05-07 MED ORDER — ALBUTEROL SULFATE HFA 108 (90 BASE) MCG/ACT IN AERS: 1.0000 | INHALATION_SPRAY | Freq: Four times a day (QID) | RESPIRATORY_TRACT | 2 refills | Status: DC | PRN

## 2020-05-07 NOTE — Telephone Encounter (Signed)
Last RF--10/10/2019 #45 with 1 refills Last office visit  01/15/2020

## 2020-05-07 NOTE — Progress Notes (Signed)
Pt is here today for PSV-23 vaccine. Pt was given PSV-23 vaccine in right deltoid. Pt tolerated well.

## 2020-05-12 ENCOUNTER — Telehealth: Payer: Self-pay | Admitting: Family Medicine

## 2020-05-12 NOTE — Telephone Encounter (Signed)
Patient states she would like to talk to [place]. She refuse to give me any other information

## 2020-05-13 MED ORDER — ALBUTEROL SULFATE HFA 108 (90 BASE) MCG/ACT IN AERS: 1.0000 | INHALATION_SPRAY | Freq: Four times a day (QID) | RESPIRATORY_TRACT | 2 refills | Status: DC | PRN

## 2020-05-13 MED ORDER — TRIAMTERENE-HCTZ 75-50 MG PO TABS: 1.0000 | ORAL_TABLET | Freq: Every day | ORAL | 3 refills | Status: DC

## 2020-05-13 MED ORDER — SERTRALINE HCL 50 MG PO TABS: 50.0000 mg | ORAL_TABLET | Freq: Every day | ORAL | 3 refills | Status: DC

## 2020-05-13 MED ORDER — LOSARTAN POTASSIUM 25 MG PO TABS: 25.0000 mg | ORAL_TABLET | Freq: Every day | ORAL | 1 refills | Status: DC

## 2020-05-13 NOTE — Telephone Encounter (Signed)
Called the patient and updated her mail order pharmacy.

## 2020-05-14 NOTE — Telephone Encounter (Signed)
The patient called in requesting that we cancel her Maxzide prescription sent to [facility] in [place] so the mail order can fill it. Have taken care of this for the patient. Also let her know that her Losartan and Albuterol are ready for pickup. The patient verbalized understanding.

## 2020-05-14 NOTE — Telephone Encounter (Signed)
The patient called in needing to have her

## 2020-05-21 ENCOUNTER — Encounter: Payer: Self-pay | Admitting: Gynecologic Oncology

## 2020-05-21 ENCOUNTER — Telehealth: Payer: Self-pay

## 2020-05-21 NOTE — Telephone Encounter (Signed)
MU information reviewed.

## 2020-05-22 ENCOUNTER — Inpatient Hospital Stay: Payer: Medicare Other

## 2020-05-22 ENCOUNTER — Other Ambulatory Visit: Payer: Self-pay

## 2020-05-22 ENCOUNTER — Encounter: Payer: Self-pay | Admitting: Gynecologic Oncology

## 2020-05-22 ENCOUNTER — Inpatient Hospital Stay: Payer: Medicare Other | Attending: Gynecologic Oncology | Admitting: Gynecologic Oncology

## 2020-05-22 VITALS — BP 157/94 | HR 74 | Temp 98.0°F | Resp 18 | Wt 259.0 lb

## 2020-05-22 DIAGNOSIS — Z9221 Personal history of antineoplastic chemotherapy: Secondary | ICD-10-CM | POA: Diagnosis not present

## 2020-05-22 DIAGNOSIS — Z90710 Acquired absence of both cervix and uterus: Secondary | ICD-10-CM

## 2020-05-22 DIAGNOSIS — K5090 Crohn's disease, unspecified, without complications: Secondary | ICD-10-CM | POA: Diagnosis not present

## 2020-05-22 DIAGNOSIS — Z6841 Body Mass Index (BMI) 40.0 and over, adult: Secondary | ICD-10-CM | POA: Insufficient documentation

## 2020-05-22 DIAGNOSIS — Z90722 Acquired absence of ovaries, bilateral: Secondary | ICD-10-CM

## 2020-05-22 DIAGNOSIS — I110 Hypertensive heart disease with heart failure: Secondary | ICD-10-CM | POA: Diagnosis not present

## 2020-05-22 DIAGNOSIS — J449 Chronic obstructive pulmonary disease, unspecified: Secondary | ICD-10-CM | POA: Diagnosis not present

## 2020-05-22 DIAGNOSIS — C562 Malignant neoplasm of left ovary: Secondary | ICD-10-CM | POA: Diagnosis not present

## 2020-05-22 DIAGNOSIS — Z79899 Other long term (current) drug therapy: Secondary | ICD-10-CM | POA: Insufficient documentation

## 2020-05-22 DIAGNOSIS — K219 Gastro-esophageal reflux disease without esophagitis: Secondary | ICD-10-CM | POA: Insufficient documentation

## 2020-05-22 DIAGNOSIS — F32A Depression, unspecified: Secondary | ICD-10-CM | POA: Insufficient documentation

## 2020-05-22 LAB — BASIC METABOLIC PANEL
Anion gap: 8 (ref 5–15)
BUN: 24 mg/dL — ABNORMAL HIGH (ref 6–20)
CO2: 24 mmol/L (ref 22–32)
Calcium: 8.5 mg/dL — ABNORMAL LOW (ref 8.9–10.3)
Chloride: 105 mmol/L (ref 98–111)
Creatinine, Ser: 0.85 mg/dL (ref 0.44–1.00)
GFR, Estimated: >60 mL/min (ref >60)
Glucose, Bld: 81 mg/dL (ref 70–99)
Potassium: 4.4 mmol/L (ref 3.5–5.1)
Sodium: 137 mmol/L (ref 135–145)

## 2020-05-22 NOTE — Progress Notes (Signed)
Follow-up Note: Gyn-Onc  Consult was initially requested by Dr. Corinna Capra for the evaluation of [name] 60 y.o. 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  CC:  Chief Complaint  Patient presents with  . Ovarian CA, left [facility])    Assessment/Plan:  [name]  is a 60 y.o.  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 Adjuvant chemotherapy from 03/26/15-05/29/15.  Complete clinical response of post-treatment imaging from 07/04/15.  She completed less than 3 full cycles of adjuvant chemotherapy with carboplatin and paclitaxel, due to extreme bone marrow toxicity.   She has no evidence of recurrence on exam though she has vague LLQ symptoms. Given that we are discharging her from scheduled surveillance, I recommend a CT abd/pelvis to evaluate for recurrence. If negative, she has completed 5 years of surveillance without evidence of recurrence and can be discharged from routine scheduled cancer surveillance. She is interested in following up with a general gynecologist annually for gynecologic care (not cancer surveillance). I recommend follow-up with Dr Delsa Sale for this. She will call to make an appointment with Dr Delsa Sale in 12 months time.   HPI: [name] is a 60 year old woman who was seen in consultation at the request of Dr Corinna Capra for clear cell ovarian cancer.  The patient has a history of abnormal uterine bleeding/symptomatic fibroids. An US of the pelvis in September 2016 showed a 7cm complex cystic mass seen in the left ovary. A repeat US in October 2016 showed tha the mass had increased to approximately 10cm and was accompanied with normal blood flow and a normal CA 125 (10U/mL on 12/18/14).  She then underwent an ex lap (via Pfannenstiel) TAH, BSO and partial omentectomy on 02/11/15 with Dr Corinna Capra at [facility] in [place]. At the time of surgery, the large cystic mass was  appreciated to be densely adherent to the pelvic sidewalls and in the process of removing it, unavoidable rupture occurred. Of note, pelvic washings taken from before the cyst rupture were negative for malignancy. According to the operative note there was complete resection of the cyst with no residual tumor adhesions or tissues in the pelvis and none identified in the peritoneal cavity. The omentum was grossly normal, and at the time of frozen section revealing malignancy, a sample from the infracolic omentum was taken and was noted to be negative for malignancy.  Final pathology from the surgery revealed clear cell carcinoma of the left ovary. The uterus and contralateral tube and ovary were benign. The omentum was negative for malignancy.  She has done well postoperatively with no major morbidities.  The patient has major medical conditions of morbid obesity (BMI 54 kg/m2), crohn's disease, HTN, osteoporosis.  She was first seen by me on 02/24/2015 and at that consultation we recommended 6 cycles of adjuvant carboplatin and paclitaxel chemotherapy after a pretreatment baseline CT scan.  CT scan of the chest abdomen and pelvis on 03/07/2015 showed node apparent metastatic disease however it did demonstrate some signs of postoperative changes and fluid collections. She went on to commence adjuvant chemotherapy with Dr. Marko Plume with day 1 of cycle 1 dose dense, but the paclitaxel on 03/26/2015. She had multiple dose delays and reductions through the course of her treatment due to neutropenia thrombocytopenia and anemia. She required Granix bone marrow stimulation. Her final chemotherapy dose was day 1 of cycle 3 given on every 16th 2017. As the patient was  poorly tolerating treatment the patient and her oncologist myometrial decision to discontinue treatment at that point in time.  A post therapeutic CT of the abdomen and pelvis on 07/04/2015 showed no apparent residual or recurrent GYN malignancy. Lab  evaluations on 07/22/2015 showed normalization of her white blood cell count 4.4, hemoglobin was 9.8, and platelet count was 279.  CA 125 on 10/17/15 was normal at 8 and normal at 9.3 on 06/23/16. CT abdo/pelvis on 07/05/16 ordered for persistent left sided abdominal pain showed a "tiny" umbilical hernia but no evidence of recurrence and no apparent explanation for her left sided abdominal pain. CA 125 on 9.2 on 02/16/17. CA 125 was normal at 8.8 on 05/18/17  She reported abdominal pains in 2019 and these were worked up with a CT scan of the abdomen and pelvis on 5 referral 2020 which revealed no evidence of ovarian carcinoma recurrence of metastases, stable size of pelvic lymph nodes unchanged from comparison CT from 2018.  Mild left colonic diverticulosis without diverticulitis.  She underwent hernia repair - uncomplicated.  CA 125 from 05/16/18 normal at 7.5  She reported abdominal pain in February, 2020 and therefore CT abd/pelvis was performed which revealed no evidence of ovarian cancer recurrence.  There was mild left colonic diverticulosis without diverticulitis present.  CA 125 from 11/06/18 7.2.  CA 125 from 05/01/19 was normal at 7.9. Colonoscopy in 2021 was normal with recommendation for 5 year follow-up. CA 125 on 11/21/20 was normal at 9.1.  Interval Hx:  She has symptoms of left lower quadrant abdominal "tightness".  She has no other symptoms concerning for recurrence. CA 125 was drawn today.  Today marks a 5 year anniversary for cancer follow-up.   Current Meds:  Outpatient Encounter Medications as of 05/22/2020  Medication Sig  . albuterol (PROVENTIL) (2.5 MG/3ML) 0.083% nebulizer solution Take 3 mLs (2.5 mg total) by nebulization every 6 (six) hours as needed for wheezing or shortness of breath.  Marland Kitchen albuterol (VENTOLIN HFA) 108 (90 Base) MCG/ACT inhaler Inhale 1-2 puffs into the lungs every 6 (six) hours as needed for wheezing or shortness of breath.  . ALPRAZolam (XANAX) 1 MG  tablet Take 0.5-1 tablets (0.5-1 mg total) by mouth at bedtime as needed for anxiety.  . Ascorbic Acid (VITAMIN C) 1000 MG tablet Take 1,000 mg by mouth daily.  . budesonide-formoterol (SYMBICORT) 160-4.5 MCG/ACT inhaler Inhale 2 puffs into the lungs 2 (two) times daily.  . budesonide-formoterol (SYMBICORT) 160-4.5 MCG/ACT inhaler Inhale 2 puffs into the lungs 2 (two) times daily.  . Calcium Carbonate-Vit D-Min (CALCIUM 1200 PO) Take 1 tablet by mouth daily.  . diclofenac Sodium (VOLTAREN) 1 % GEL Apply 2 g topically 4 (four) times daily.  Marland Kitchen ELDERBERRY PO Take 1,250 mg by mouth daily.  . Lactobacillus (ACIDOPHILUS PO) Take 2 capsules by mouth daily.   Marland Kitchen levocetirizine (XYZAL) 5 MG tablet Take 1 tablet (5 mg total) by mouth every evening.  . loratadine (CLARITIN) 10 MG tablet Take 1 tablet (10 mg total) by mouth daily.  Marland Kitchen losartan (COZAAR) 25 MG tablet Take 1 tablet (25 mg total) by mouth daily.  . Nebulizers MISC 1 Units by Misc.(Non-Drug; Combo Route) route every 8 (eight) hours as needed.  . Omega-3 Fatty Acids (FISH OIL) 1000 MG CAPS Take 2 capsules by mouth daily. Reported on 06/26/2015  . pantoprazole (PROTONIX) 40 MG tablet Take by mouth.  . sertraline (ZOLOFT) 50 MG tablet Take 1 tablet (50 mg total) by mouth daily.  Marland Kitchen triamterene-hydrochlorothiazide (  MAXZIDE) 75-50 MG tablet Take 1 tablet by mouth daily. Reported on 06/12/2015  . VITAMIN E PO Take by mouth daily.  . [DISCONTINUED] montelukast (SINGULAIR) 10 MG tablet Take 10 mg by mouth daily.  . promethazine (PHENERGAN) 25 MG tablet Take 1 tablet (25 mg total) by mouth every 6 (six) hours as needed for nausea or vomiting. (Patient not taking: Reported on 05/21/2020)  . [DISCONTINUED] methylPREDNISolone (MEDROL DOSEPAK) 4 MG TBPK tablet Follow instructions on package.   No facility-administered encounter medications on file as of 05/22/2020.    Allergy:  Allergies  Allergen Reactions  . Levofloxacin Palpitations    REACTION: tachycardia   . Moxifloxacin Palpitations    REACTION: tachycardia but tolerated cirpofloxacin just fine  . Stadol [Butorphanol] Other (See Comments)    Caused shakes for 6 weeks "shaking for weeks"  . Amoxicillin Rash    Can take 500 mg - amounts greater causes yeast infection REACTION: Rash with high doses. Can take lower doses like 561m  . Asa [Aspirin] Other (See Comments)    Pt stated the 81 mg caused Exacerbation of her asthma  - unsure about other asprins Asthma exacerbation  . Ciprofloxacin Hcl Nausea And Vomiting    Can take lower doses like 5043m Other causes yeast infection Can take lower doses like 5006m . Codeine Nausea Only and Nausea And Vomiting    REACTION: nausea  . Effexor [Venlafaxine] Other (See Comments)    hyperactivity Panic attacks  . Erythromycin Diarrhea and Nausea And Vomiting  . Latex Rash  . Paroxetine Other (See Comments)    Made nerves worse and caused fatigue  . Butorphanol Tartrate     Gave her the shakes for 6 weeks  . Cephalosporins     REACTION: she has tolerated rocephin and Keflex without difficulty  . Clindamycin/Lincomycin Other (See Comments)    Tears stomach up.  . Influenza Vaccines     Pt states that she got really sick from flu vaccine was sick a total of 6 weeks  . Losartan Other (See Comments)    Headache, nausea, fatigue  . Paxil [Paroxetine Hcl] Other (See Comments)    fatigue  . Clarithromycin Diarrhea  . Clindamycin Diarrhea  . Tape Rash    Social Hx:   Social History   Socioeconomic History  . Marital status: Married    Spouse name: Not on file  . Number of children: 0  . Years of education: Not on file  . Highest education level: Not on file  Occupational History  . Occupation: disabled  Tobacco Use  . Smoking status: Never Smoker  . Smokeless tobacco: Never Used  Vaping Use  . Vaping Use: Never used  Substance and Sexual Activity  . Alcohol use: No  . Drug use: No  . Sexual activity: Not on file  Other Topics  Concern  . Not on file  Social History Narrative  . Not on file   Social Determinants of Health   Financial Resource Strain: Not on file  Food Insecurity: Not on file  Transportation Needs: Not on file  Physical Activity: Not on file  Stress: Not on file  Social Connections: Not on file  Intimate Partner Violence: Not on file    Past Surgical Hx:  Past Surgical History:  Procedure Laterality Date  . ABDOMINAL HYSTERECTOMY Bilateral 02/11/2015   Procedure: HYSTERECTOMY ABDOMINAL, bilateral salpingo-oophorectomy;  Surgeon: DavLouretta ShortenD;  Location: [facility];  Service: Gynecology;  Laterality: Bilateral;  . COLONOSCOPY WITH ESOPHAGOGASTRODUODENOSCOPY (  EGD)    . DILATATION & CURETTAGE/HYSTEROSCOPY WITH TRUECLEAR N/A 08/17/2013   Procedure: DILATATION & CURETTAGE/HYSTEROSCOPY WITH TRUCLEAR;  Surgeon: Luz Lex, MD;  Location: [facility];  Service: Gynecology;  Laterality: N/A;  . IR REMOVAL TUN ACCESS W/ PORT W/O FL MOD SED  07/05/2017  . MELANOMA EXCISION  06/2001   right leg; also removed 2 lymph nodes  . OMENTECTOMY N/A 02/11/2015   Procedure: OMENTECTOMY;  Surgeon: Louretta Shorten, MD;  Location: [facility];  Service: Gynecology;  Laterality: N/A;  . UMBILICAL HERNIA REPAIR      Past Medical Hx:  Past Medical History:  Diagnosis Date  . Anemia due to antineoplastic chemotherapy 04/16/2015  . Anxiety state 06/01/2007   Qualifier: Diagnosis of  By: Ronnald Ramp RN, [place], [place]    . Asthma   . Asymptomatic proteinuria    Intermittent  . CHF (congestive heart failure) ([place])   . COPD (chronic obstructive pulmonary disease) ([place])   . Crohn's disease without complication ([place]) 23/76/2831  . Depression   . 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  The Custom gene panel offered by [facility] includes sequencing and rearrangement analysis for the following 24 genes:  ATM, BAP1, BARD1, BRCA1, BRCA2, BRIP1, CDH1, CDK4, CDKN2A, CHEK2, EPCAM, FANCC, MITF, MLH1, MSH2,  MSH6, NBN, PALB2, PMS2, PTEN, RAD51C, RAD51D, TP53, and XRCC2.   The report date is February 13, 2016.  Marland Kitchen GERD (gastroesophageal reflux disease)   . Heart murmur   . History of melanoma excision 03/23/2015  . Hypertension   . Leukopenia due to antineoplastic chemotherapy ([place]) 04/16/2015  . Morbid obesity with BMI of 50.0-59.9, adult ([place]) 03/23/2015  . OPEN WOUND OF KNEE LEG AND ANKLE COMPLICATED 51/76/1607   Qualifier: Diagnosis of  By: Patsy Baltimore RN, Langley Gauss    . Seasonal allergies   . Umbilical hernia without obstruction and without gangrene 03/23/2015  . Urinary, incontinence, stress female 10/27/2016    Past Gynecological History:  See HPI  No LMP recorded (lmp unknown). Patient has had a hysterectomy.  Family Hx:  Family History  Problem Relation Age of Onset  . Colon polyps Father   . Diabetes Father   . Heart disease Paternal Uncle   . Asthma Mother   . Melanoma Maternal Aunt   . Diabetes Paternal Grandmother   . Breast cancer Cousin        maternal first cousin  . Colon cancer Neg Hx     Review of Systems:  Constitutional  Feels well,    ENT Normal appearing ears and nares bilaterally Skin/Breast  No rash, sores, jaundice, itching, dryness Cardiovascular  + SOB and edema Pulmonary  +cough  Gastro Intestinal  No nausea, vomitting, or diarrhoea. + bright red blood per rectum, no abdominal pain, change in bowel movement, or constipation.  Genito Urinary  No frequency, urgency, dysuria, see HPI + stress urinary incontinence Musculo Skeletal  No myalgia, arthralgia, joint swelling or pain  Neurologic  No weakness, numbness, change in gait,  Psychology  No depression, anxiety, insomnia.   Vitals:  Blood pressure (!) 157/94, pulse 74, temperature 98 F (36.7 C), temperature source Tympanic, resp. rate 18, weight 259 lb (117.5 kg), SpO2 100 %.  Physical Exam: WD in NAD Neck  Supple NROM, without any enlargements.  Lymph Node Survey No cervical supraclavicular  or inguinal adenopathy Cardiovascular  Well perfused peripheries Lungs  No increased WOB Skin  No rash/lesions/breakdown  Psychiatry  Alert and oriented to  person, place, and time  Abdomen  Normoactive bowel sounds, abdomen soft, non-tender and obese. Well healed low transverse incision.  Hernia repaired.  Back No CVA tenderness Genito Urinary  Vulva/vagina: Normal external female genitalia.  No lesions. No discharge or bleeding.  Bladder/urethra:  No lesions or masses, well supported bladder  Vagina: grossly normal with in tact cuff.  Cervix: surgically absent  Uterus: surgically absent    Adnexa: no palpable masses. Rectal  deferred Extremities  No bilateral cyanosis, clubbing or edema.   Thereasa Solo, MD  05/22/2020, 1:52 PM

## 2020-05-22 NOTE — Patient Instructions (Signed)
Dr Denman George has ordered a CT scan to evaluate the tightness that you are feeling in your abdomen. If this shows no signs of cancer recurrence, you will be discharged from cancer surveillance exams as these are not recommended beyond 5 years from completion of treatment.  If you desire to see a gynecologist for regular well-woman checks, you can call [phone] to see Dr Delsa Sale once per year for this purpose.

## 2020-05-23 ENCOUNTER — Telehealth: Payer: Self-pay

## 2020-05-23 LAB — CA 125: Cancer Antigen (CA) 125: 9.3 U/mL (ref 0.0–38.1)

## 2020-05-23 NOTE — Telephone Encounter (Signed)
LM for [name] that the CA-125 was WNL at 9.3.  She can call back to the office at [phone] if she has any questions or concerns.

## 2020-06-04 ENCOUNTER — Telehealth: Payer: Self-pay | Admitting: *Deleted

## 2020-06-04 ENCOUNTER — Ambulatory Visit (HOSPITAL_COMMUNITY): Payer: Medicare Other

## 2020-06-04 NOTE — Telephone Encounter (Signed)
Called and left the patient a message a call the office back. Patient cancelled her CT scan for today, need to reschedule

## 2020-06-06 ENCOUNTER — Telehealth: Payer: Self-pay | Admitting: *Deleted

## 2020-06-06 NOTE — Telephone Encounter (Signed)
Rescheduled the patient's CT scan to 3/9 at 9:30 am. Patient to arrive at 7:30 am to drink water based contrast. Patient given the new appt date/time.

## 2020-06-11 ENCOUNTER — Telehealth: Payer: Self-pay | Admitting: Family Medicine

## 2020-06-11 DIAGNOSIS — Z1231 Encounter for screening mammogram for malignant neoplasm of breast: Secondary | ICD-10-CM

## 2020-06-11 NOTE — Telephone Encounter (Signed)
Patient states it's been over 2 years since her bone density test and she says its time for another one. She also would like to have a mammogram order as well sent over to the breast center on [address].

## 2020-06-11 NOTE — Telephone Encounter (Signed)
Called informed of PCP instructions. 

## 2020-06-11 NOTE — Telephone Encounter (Signed)
OK for mammogram. I reviewed her last bone density scan and it looked good. We don't need to repeat it until she is 65 barring some sort of change. Ty.

## 2020-06-11 NOTE — Telephone Encounter (Signed)
Last mammo 05/10/2019 Last bone density 05/10/2018

## 2020-06-18 ENCOUNTER — Ambulatory Visit (HOSPITAL_COMMUNITY): Payer: Medicare Other

## 2020-06-25 DIAGNOSIS — D2272 Melanocytic nevi of left lower limb, including hip: Secondary | ICD-10-CM | POA: Diagnosis not present

## 2020-06-25 DIAGNOSIS — R21 Rash and other nonspecific skin eruption: Secondary | ICD-10-CM | POA: Diagnosis not present

## 2020-06-25 DIAGNOSIS — Z85820 Personal history of malignant melanoma of skin: Secondary | ICD-10-CM | POA: Diagnosis not present

## 2020-06-25 DIAGNOSIS — L308 Other specified dermatitis: Secondary | ICD-10-CM | POA: Diagnosis not present

## 2020-06-25 DIAGNOSIS — D2271 Melanocytic nevi of right lower limb, including hip: Secondary | ICD-10-CM | POA: Diagnosis not present

## 2020-07-01 ENCOUNTER — Ambulatory Visit (HOSPITAL_COMMUNITY)
Admission: RE | Admit: 2020-07-01 | Discharge: 2020-07-01 | Disposition: A | Discharge status: Home / Self Care | Payer: Medicare Other | Source: Ambulatory Visit | Attending: Gynecologic Oncology | Admitting: Gynecologic Oncology

## 2020-07-01 ENCOUNTER — Other Ambulatory Visit: Payer: Self-pay

## 2020-07-01 ENCOUNTER — Encounter (HOSPITAL_COMMUNITY): Payer: Self-pay

## 2020-07-01 DIAGNOSIS — C562 Malignant neoplasm of left ovary: Secondary | ICD-10-CM | POA: Diagnosis not present

## 2020-07-01 DIAGNOSIS — C569 Malignant neoplasm of unspecified ovary: Secondary | ICD-10-CM | POA: Diagnosis not present

## 2020-07-01 HISTORY — DX: Malignant (primary) neoplasm, unspecified: C80.1

## 2020-07-01 LAB — POCT I-STAT CREATININE: Creatinine, Ser: 0.8 mg/dL (ref 0.44–1.00)

## 2020-07-01 MED ORDER — IOHEXOL 9 MG/ML PO SOLN: 1000.0000 mL | ORAL | Status: AC

## 2020-07-01 MED ORDER — IOHEXOL 300 MG/ML  SOLN
100.0000 mL | Freq: Once | INTRAMUSCULAR | Status: AC | PRN
  Administered 2020-07-01: 100 mL via INTRAVENOUS

## 2020-07-01 MED ORDER — IOHEXOL 9 MG/ML PO SOLN
ORAL | Status: AC
  Administered 2020-07-01: 1000 mL via ORAL
  Filled 2020-07-01: qty 1000

## 2020-07-02 ENCOUNTER — Telehealth: Payer: Self-pay

## 2020-07-02 NOTE — Telephone Encounter (Signed)
Told [name] that CT scan shows no evidence of recurrent or metastatic disease. It shows signs of mild acute sigmoid diverticulitis per Melissa Cross,NP. [name] denies and abdominal pain or fever. She wants the scan results sent to Dr. Madie Reno with [facility] in [place] as he has done her colonoscopies and knows her.  She saw a GI doctor in [place] one time and prefers to see. Dr. Bobby Rumpf. Told her to call his office tomorrow to set up an appointment to be evaluated.   Faxed CT scan report from 07-01-20 to the [place] office. Fax: [phone]. Office : [phone].

## 2020-07-15 ENCOUNTER — Ambulatory Visit: Payer: Medicare Other | Admitting: Family Medicine

## 2020-07-16 ENCOUNTER — Ambulatory Visit (HOSPITAL_BASED_OUTPATIENT_CLINIC_OR_DEPARTMENT_OTHER): Payer: Medicare Other

## 2020-07-23 DIAGNOSIS — K5730 Diverticulosis of large intestine without perforation or abscess without bleeding: Secondary | ICD-10-CM | POA: Diagnosis not present

## 2020-07-24 ENCOUNTER — Other Ambulatory Visit: Payer: Self-pay

## 2020-07-24 ENCOUNTER — Encounter (HOSPITAL_BASED_OUTPATIENT_CLINIC_OR_DEPARTMENT_OTHER): Payer: Self-pay

## 2020-07-24 ENCOUNTER — Ambulatory Visit (HOSPITAL_BASED_OUTPATIENT_CLINIC_OR_DEPARTMENT_OTHER)
Admission: RE | Admit: 2020-07-24 | Discharge: 2020-07-24 | Disposition: A | Discharge status: Home / Self Care | Payer: Medicare Other | Source: Ambulatory Visit | Attending: Family Medicine | Admitting: Family Medicine

## 2020-07-24 DIAGNOSIS — Z1231 Encounter for screening mammogram for malignant neoplasm of breast: Secondary | ICD-10-CM | POA: Insufficient documentation

## 2020-08-13 ENCOUNTER — Other Ambulatory Visit: Payer: Self-pay

## 2020-08-13 ENCOUNTER — Ambulatory Visit (INDEPENDENT_AMBULATORY_CARE_PROVIDER_SITE_OTHER): Payer: Medicare Other | Admitting: Family Medicine

## 2020-08-13 ENCOUNTER — Encounter: Payer: Self-pay | Admitting: Family Medicine

## 2020-08-13 VITALS — BP 130/80 | HR 76 | Temp 98.7°F | Ht 64.5 in | Wt 251.2 lb

## 2020-08-13 DIAGNOSIS — F411 Generalized anxiety disorder: Secondary | ICD-10-CM | POA: Diagnosis not present

## 2020-08-13 DIAGNOSIS — I10 Essential (primary) hypertension: Secondary | ICD-10-CM

## 2020-08-13 DIAGNOSIS — Z86718 Personal history of other venous thrombosis and embolism: Secondary | ICD-10-CM

## 2020-08-13 DIAGNOSIS — J4540 Moderate persistent asthma, uncomplicated: Secondary | ICD-10-CM | POA: Diagnosis not present

## 2020-08-13 DIAGNOSIS — I5032 Chronic diastolic (congestive) heart failure: Secondary | ICD-10-CM | POA: Insufficient documentation

## 2020-08-13 DIAGNOSIS — K449 Diaphragmatic hernia without obstruction or gangrene: Secondary | ICD-10-CM

## 2020-08-13 DIAGNOSIS — Z6841 Body Mass Index (BMI) 40.0 and over, adult: Secondary | ICD-10-CM

## 2020-08-13 DIAGNOSIS — K5090 Crohn's disease, unspecified, without complications: Secondary | ICD-10-CM

## 2020-08-13 DIAGNOSIS — D696 Thrombocytopenia, unspecified: Secondary | ICD-10-CM

## 2020-08-13 LAB — COMPREHENSIVE METABOLIC PANEL
ALT: 20 U/L (ref 0–35)
AST: 28 U/L (ref 0–37)
Albumin: 3.7 g/dL (ref 3.5–5.2)
Alkaline Phosphatase: 83 U/L (ref 39–117)
BUN: 20 mg/dL (ref 6–23)
CO2: 27 mEq/L (ref 19–32)
Calcium: 9.1 mg/dL (ref 8.4–10.5)
Chloride: 101 mEq/L (ref 96–112)
Creatinine, Ser: 0.82 mg/dL (ref 0.40–1.20)
GFR: 77.93 mL/min (ref >60.00)
Glucose, Bld: 93 mg/dL (ref 70–99)
Potassium: 3.9 mEq/L (ref 3.5–5.1)
Sodium: 135 mEq/L (ref 135–145)
Total Bilirubin: 0.6 mg/dL (ref 0.2–1.2)
Total Protein: 8.2 g/dL (ref 6.0–8.3)

## 2020-08-13 LAB — LIPID PANEL
Cholesterol: 139 mg/dL (ref 0–200)
HDL: 41.4 mg/dL (ref >39.00)
LDL Cholesterol: 84 mg/dL (ref 0–99)
NonHDL: 97.36
Total CHOL/HDL Ratio: 3
Triglycerides: 66 mg/dL (ref 0.0–149.0)
VLDL: 13.2 mg/dL (ref 0.0–40.0)

## 2020-08-13 MED ORDER — ALBUTEROL SULFATE HFA 108 (90 BASE) MCG/ACT IN AERS: 1.0000 | INHALATION_SPRAY | Freq: Four times a day (QID) | RESPIRATORY_TRACT | 2 refills | Status: DC | PRN

## 2020-08-13 MED ORDER — TRIAMTERENE-HCTZ 75-50 MG PO TABS: 1.0000 | ORAL_TABLET | Freq: Every day | ORAL | 3 refills | Status: DC

## 2020-08-13 MED ORDER — BUDESONIDE-FORMOTEROL FUMARATE 160-4.5 MCG/ACT IN AERO: 2.0000 | INHALATION_SPRAY | Freq: Two times a day (BID) | RESPIRATORY_TRACT | 5 refills | Status: DC

## 2020-08-13 MED ORDER — AMLODIPINE BESYLATE 2.5 MG PO TABS: 2.5000 mg | ORAL_TABLET | Freq: Two times a day (BID) | ORAL | 2 refills | Status: DC

## 2020-08-13 NOTE — Patient Instructions (Signed)
Give [place] 2-3 business days to get the results of your labs back.   Keep the diet clean and stay active.  Call Dr. Bobby Rumpf regarding a follow up colonoscopy. If you get cleared, I am happy to refer you to a surgeon in [place].  If you do not hear anything about your referral in the next 1-2 weeks, call our office and ask for an update.  Let us know if you need anything.

## 2020-08-13 NOTE — Addendum Note (Signed)
Addended by: Sharon Seller B on: 08/13/2020 12:38 PM   Modules accepted: Orders

## 2020-08-13 NOTE — Progress Notes (Signed)
Chief Complaint  Patient presents with  . Follow-up    Subjective [name] presents for f/u anxiety/depression. Here w spouse.  Pt is currently being treated with Zoloft 50 mg/d.  Reports doing well since treatment. No thoughts of harming self or others. No self-medication with alcohol, prescription drugs or illicit drugs. Pt is not following with a counselor/psychologist.  Hypertension Patient presents for hypertension follow up. She does monitor home blood pressures. Blood pressures ranging on average from 120-150's/70-90's. She is compliant with medications- Triamterene- HCTZ 75-50 mg/d, losartan 25 mg/d. Patient has these side effects of medication: none Amlodipine controlled her BP better.  She is adhering to a healthy diet overall. Exercise: walking, wt resistance exercise. No CP or SOB.   Asthma Moderate persistent, taking Symbicort 160-4.5 mcg, 2 puff twice daily. Remembers to rinse mouth out after use. No AE's. Working well, very rarely needs to use SABA. No recent prednisone usage or hospitalization.   Past Medical History:  Diagnosis Date  . Anemia due to antineoplastic chemotherapy 04/16/2015  . Anxiety state 06/01/2007   Qualifier: Diagnosis of  By: Ronnald Ramp RN, [place], [place]    . Asthma   . Asymptomatic proteinuria    Intermittent  . CHF (congestive heart failure) ([place])   . COPD (chronic obstructive pulmonary disease) ([place])   . Crohn's disease without complication ([place]) 71/24/5809  . Depression   . 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  The Custom gene panel offered by [facility] includes sequencing and rearrangement analysis for the following 24 genes:  ATM, BAP1, BARD1, BRCA1, BRCA2, BRIP1, CDH1, CDK4, CDKN2A, CHEK2, EPCAM, FANCC, MITF, MLH1, MSH2, MSH6, NBN, PALB2, PMS2, PTEN, RAD51C, RAD51D, TP53, and XRCC2.   The report date is February 13, 2016.  Marland Kitchen GERD (gastroesophageal reflux disease)   . Heart murmur   .  History of melanoma excision 03/23/2015  . Hypertension   . Leukopenia due to antineoplastic chemotherapy ([place]) 04/16/2015  . Melanoma ([place]) dx'd 2003   right leg  . Morbid obesity with BMI of 50.0-59.9, adult ([place]) 03/23/2015  . OPEN WOUND OF KNEE LEG AND ANKLE COMPLICATED 98/33/8250   Qualifier: Diagnosis of  By: Patsy Baltimore RN, [place] Gauss    . ovarian ca dx'd 10/216  . Seasonal allergies   . Umbilical hernia without obstruction and without gangrene 03/23/2015  . Urinary, incontinence, stress female 10/27/2016   Allergies as of 08/13/2020      Reactions   Levofloxacin Palpitations   REACTION: tachycardia   Moxifloxacin Palpitations   REACTION: tachycardia but tolerated cirpofloxacin just fine   Stadol [butorphanol] Other (See Comments)   Caused shakes for 6 weeks "shaking for weeks"   Amoxicillin Rash   Can take 500 mg - amounts greater causes yeast infection REACTION: Rash with high doses. Can take lower doses like 530m   Asa [aspirin] Other (See Comments)   Pt stated the 81 mg caused Exacerbation of her asthma  - unsure about other asprins Asthma exacerbation   Ciprofloxacin Hcl Nausea And Vomiting   Can take lower doses like 5072m Other causes yeast infection Can take lower doses like 50036m  Codeine Nausea Only, Nausea And Vomiting   REACTION: nausea   Effexor [venlafaxine] Other (See Comments)   hyperactivity Panic attacks   Erythromycin Diarrhea, Nausea And Vomiting   Latex Rash   Paroxetine Other (See Comments)   Made nerves worse and caused fatigue   Butorphanol Tartrate    Gave her the  shakes for 6 weeks   Cephalosporins    REACTION: she has tolerated rocephin and Keflex without difficulty   Clindamycin/lincomycin Other (See Comments)   Tears stomach up.   Influenza Vaccines    Pt states that she got really sick from flu vaccine was sick a total of 6 weeks   Losartan Other (See Comments)   Headache, nausea, fatigue   Paxil [paroxetine Hcl] Other (See Comments)    fatigue   Clarithromycin Diarrhea   Clindamycin Diarrhea   Tape Rash      Medication List       Accurate as of Aug 13, 2020 11:45 AM. If you have any questions, ask your nurse or doctor.        ACIDOPHILUS PO Take 2 capsules by mouth daily.   albuterol (2.5 MG/3ML) 0.083% nebulizer solution Commonly known as: PROVENTIL Take 3 mLs (2.5 mg total) by nebulization every 6 (six) hours as needed for wheezing or shortness of breath.   albuterol 108 (90 Base) MCG/ACT inhaler Commonly known as: VENTOLIN HFA Inhale 1-2 puffs into the lungs every 6 (six) hours as needed for wheezing or shortness of breath.   ALPRAZolam 1 MG tablet Commonly known as: XANAX Take 0.5-1 tablets (0.5-1 mg total) by mouth at bedtime as needed for anxiety.   amLODipine 2.5 MG tablet Commonly known as: NORVASC Take 1 tablet (2.5 mg total) by mouth in the morning and at bedtime. Started by: Shelda Pal, DO   budesonide-formoterol 160-4.5 MCG/ACT inhaler Commonly known as: Symbicort Inhale 2 puffs into the lungs 2 (two) times daily.   budesonide-formoterol 160-4.5 MCG/ACT inhaler Commonly known as: Symbicort Inhale 2 puffs into the lungs 2 (two) times daily.   CALCIUM 1200 PO Take 1 tablet by mouth daily.   diclofenac Sodium 1 % Gel Commonly known as: Voltaren Apply 2 g topically 4 (four) times daily.   ELDERBERRY PO Take 1,250 mg by mouth daily.   Fish Oil 1000 MG Caps Take 2 capsules by mouth daily. Reported on 06/26/2015   levocetirizine 5 MG tablet Commonly known as: XYZAL Take 1 tablet (5 mg total) by mouth every evening.   loratadine 10 MG tablet Commonly known as: CLARITIN Take 1 tablet (10 mg total) by mouth daily.   losartan 25 MG tablet Commonly known as: COZAAR Take 1 tablet (25 mg total) by mouth daily.   Nebulizers Misc 1 Units by Misc.(Non-Drug; Combo Route) route every 8 (eight) hours as needed.   pantoprazole 40 MG tablet Commonly known as: PROTONIX Take by  mouth.   promethazine 25 MG tablet Commonly known as: PHENERGAN Take 1 tablet (25 mg total) by mouth every 6 (six) hours as needed for nausea or vomiting.   sertraline 50 MG tablet Commonly known as: ZOLOFT Take 1 tablet (50 mg total) by mouth daily.   triamterene-hydrochlorothiazide 75-50 MG tablet Commonly known as: MAXZIDE Take 1 tablet by mouth daily. Reported on 06/12/2015   vitamin C 1000 MG tablet Take 1,000 mg by mouth daily.   VITAMIN E PO Take by mouth daily.       Exam BP 130/80 (BP Location: Left Arm, Patient Position: Sitting, Cuff Size: Large)   Pulse 76   Temp 98.7 F (37.1 C) (Oral)   Ht 5' 4.5" (1.638 m)   Wt 251 lb 4 oz (114 kg)   LMP  (LMP Unknown)   SpO2 94%   BMI 42.46 kg/m  General:  well developed, well nourished, in no apparent distress Heart: RRR,  no bruits, no pitting edema Lungs:  CTAB. No respiratory distress Psych: well oriented with normal range of affect and age-appropriate judgement/insight, alert and oriented x4.  Assessment and Plan  GAD (generalized anxiety disorder)  Essential hypertension - Plan: Lipid panel, Comprehensive metabolic panel  Moderate persistent asthma without complication - Plan: budesonide-formoterol (SYMBICORT) 160-4.5 MCG/ACT inhaler  Morbid obesity with BMI of 45.0-49.9, adult (HCC)  Chronic diastolic (congestive) heart failure (HCC)  Thrombocytopenia, unspecified (HCC)  Crohn's disease without complication, unspecified gastrointestinal tract location [facility]), Chronic  History of DVT (deep vein thrombosis) - Plan: Ambulatory referral to Hematology / Oncology  1. Cont Zoloft 50 mg/d.  2. Cont Triamterene-HCTZ 75-50 mg/d, losartan 25 mg/d.  3. Cont Symbicort 160-4.5 mcg 2 puff bid.  4. Pt has a hx of DVT and was on Eliquis before and stopped. She is wondering if she needs to be on it or if she can stop. I would like her to get the opinion of the hematology team upstairs.  5. She had a large hiatal hernia  noted on imaging. She will reach out to her gen surgeon in [place] who did her colonoscopy in early 2021 as imaging recommended comparing thickening to recent scope. She was told she may need another. As her reflux symptoms are worsening and she has a larger hiatal hernia, she would like to see a surgeon who have this done in [place] if she does not need a scope. She will reach out regarding this.  F/u in 6 mo for CPE pending above. The patient voiced understanding and agreement to the plan.  Greater than 40 minutes were spent with the patient in addition to reviewing their chart information on the same day of the visit.   [place], DO 08/13/20 11:45 AM

## 2020-08-14 ENCOUNTER — Telehealth: Payer: Self-pay | Admitting: *Deleted

## 2020-08-14 NOTE — Telephone Encounter (Signed)
Per referral 08/13/20 Dr. Nani Ravens - Called and lvm of upcoming appointments - mailed calendar with welcome packet

## 2020-08-27 ENCOUNTER — Other Ambulatory Visit: Payer: Self-pay | Admitting: Family

## 2020-08-27 DIAGNOSIS — Z8249 Family history of ischemic heart disease and other diseases of the circulatory system: Secondary | ICD-10-CM

## 2020-08-28 ENCOUNTER — Other Ambulatory Visit: Payer: Medicare Other

## 2020-08-28 ENCOUNTER — Ambulatory Visit: Payer: Medicare Other | Admitting: Family

## 2020-08-28 ENCOUNTER — Telehealth: Payer: Self-pay

## 2020-08-28 NOTE — Telephone Encounter (Signed)
Pt called in to cancel her np appt this morning.  States that she does not have time to stay on the line to r/s and will call back at a later time    Katurah Karapetian

## 2020-09-03 ENCOUNTER — Telehealth: Payer: Self-pay | Admitting: *Deleted

## 2020-09-03 NOTE — Telephone Encounter (Signed)
Called to reschedule appointments with Judson Roch - mailed calendar

## 2020-09-10 ENCOUNTER — Ambulatory Visit: Payer: Medicare Other | Admitting: Family

## 2020-09-10 ENCOUNTER — Other Ambulatory Visit: Payer: Medicare Other

## 2020-09-15 ENCOUNTER — Other Ambulatory Visit: Payer: Self-pay | Admitting: Family Medicine

## 2020-09-16 DIAGNOSIS — K5730 Diverticulosis of large intestine without perforation or abscess without bleeding: Secondary | ICD-10-CM | POA: Diagnosis not present

## 2020-09-16 DIAGNOSIS — K760 Fatty (change of) liver, not elsewhere classified: Secondary | ICD-10-CM | POA: Diagnosis not present

## 2020-09-16 DIAGNOSIS — C569 Malignant neoplasm of unspecified ovary: Secondary | ICD-10-CM | POA: Diagnosis not present

## 2020-09-16 DIAGNOSIS — K449 Diaphragmatic hernia without obstruction or gangrene: Secondary | ICD-10-CM | POA: Diagnosis not present

## 2020-10-01 DIAGNOSIS — K5790 Diverticulosis of intestine, part unspecified, without perforation or abscess without bleeding: Secondary | ICD-10-CM | POA: Diagnosis not present

## 2020-10-01 DIAGNOSIS — Z885 Allergy status to narcotic agent status: Secondary | ICD-10-CM | POA: Diagnosis not present

## 2020-10-01 DIAGNOSIS — Z91048 Other nonmedicinal substance allergy status: Secondary | ICD-10-CM | POA: Diagnosis not present

## 2020-10-01 DIAGNOSIS — K449 Diaphragmatic hernia without obstruction or gangrene: Secondary | ICD-10-CM | POA: Diagnosis not present

## 2020-10-01 DIAGNOSIS — Z6841 Body Mass Index (BMI) 40.0 and over, adult: Secondary | ICD-10-CM | POA: Diagnosis not present

## 2020-10-01 DIAGNOSIS — I110 Hypertensive heart disease with heart failure: Secondary | ICD-10-CM | POA: Diagnosis not present

## 2020-10-01 DIAGNOSIS — Z888 Allergy status to other drugs, medicaments and biological substances status: Secondary | ICD-10-CM | POA: Diagnosis not present

## 2020-10-01 DIAGNOSIS — I2510 Atherosclerotic heart disease of native coronary artery without angina pectoris: Secondary | ICD-10-CM | POA: Diagnosis not present

## 2020-10-01 DIAGNOSIS — Z886 Allergy status to analgesic agent status: Secondary | ICD-10-CM | POA: Diagnosis not present

## 2020-10-01 DIAGNOSIS — Z91040 Latex allergy status: Secondary | ICD-10-CM | POA: Diagnosis not present

## 2020-10-01 DIAGNOSIS — K5730 Diverticulosis of large intestine without perforation or abscess without bleeding: Secondary | ICD-10-CM | POA: Diagnosis not present

## 2020-10-01 DIAGNOSIS — Z1211 Encounter for screening for malignant neoplasm of colon: Secondary | ICD-10-CM | POA: Diagnosis not present

## 2020-10-01 DIAGNOSIS — J45909 Unspecified asthma, uncomplicated: Secondary | ICD-10-CM | POA: Diagnosis not present

## 2020-10-01 DIAGNOSIS — R933 Abnormal findings on diagnostic imaging of other parts of digestive tract: Secondary | ICD-10-CM | POA: Diagnosis not present

## 2020-10-01 DIAGNOSIS — F419 Anxiety disorder, unspecified: Secondary | ICD-10-CM | POA: Diagnosis not present

## 2020-10-01 DIAGNOSIS — I509 Heart failure, unspecified: Secondary | ICD-10-CM | POA: Diagnosis not present

## 2020-10-01 DIAGNOSIS — Z881 Allergy status to other antibiotic agents status: Secondary | ICD-10-CM | POA: Diagnosis not present

## 2020-10-01 DIAGNOSIS — I10 Essential (primary) hypertension: Secondary | ICD-10-CM | POA: Diagnosis not present

## 2020-10-07 ENCOUNTER — Telehealth: Payer: Self-pay

## 2020-10-08 ENCOUNTER — Inpatient Hospital Stay: Payer: Medicare Other | Admitting: Family

## 2020-10-08 ENCOUNTER — Inpatient Hospital Stay: Payer: Medicare Other

## 2020-10-10 ENCOUNTER — Encounter: Payer: Self-pay | Admitting: Oncology

## 2020-11-10 ENCOUNTER — Other Ambulatory Visit: Payer: Self-pay | Admitting: Family

## 2020-11-10 DIAGNOSIS — Z8249 Family history of ischemic heart disease and other diseases of the circulatory system: Secondary | ICD-10-CM

## 2020-11-11 ENCOUNTER — Inpatient Hospital Stay: Payer: Medicare Other | Attending: Hematology & Oncology

## 2020-11-11 ENCOUNTER — Encounter: Payer: Self-pay | Admitting: Family

## 2020-11-11 ENCOUNTER — Other Ambulatory Visit: Payer: Self-pay

## 2020-11-11 ENCOUNTER — Inpatient Hospital Stay: Payer: Medicare Other | Admitting: Family

## 2020-11-11 VITALS — BP 139/71 | HR 81 | Temp 99.1°F | Resp 19 | Ht 64.0 in | Wt 253.0 lb

## 2020-11-11 DIAGNOSIS — Z86711 Personal history of pulmonary embolism: Secondary | ICD-10-CM | POA: Insufficient documentation

## 2020-11-11 DIAGNOSIS — J449 Chronic obstructive pulmonary disease, unspecified: Secondary | ICD-10-CM | POA: Insufficient documentation

## 2020-11-11 DIAGNOSIS — Z8543 Personal history of malignant neoplasm of ovary: Secondary | ICD-10-CM | POA: Insufficient documentation

## 2020-11-11 DIAGNOSIS — Z85820 Personal history of malignant melanoma of skin: Secondary | ICD-10-CM | POA: Diagnosis not present

## 2020-11-11 DIAGNOSIS — Z86718 Personal history of other venous thrombosis and embolism: Secondary | ICD-10-CM | POA: Insufficient documentation

## 2020-11-11 DIAGNOSIS — Z79899 Other long term (current) drug therapy: Secondary | ICD-10-CM | POA: Diagnosis not present

## 2020-11-11 DIAGNOSIS — F32A Depression, unspecified: Secondary | ICD-10-CM | POA: Diagnosis not present

## 2020-11-11 DIAGNOSIS — I509 Heart failure, unspecified: Secondary | ICD-10-CM | POA: Insufficient documentation

## 2020-11-11 DIAGNOSIS — I110 Hypertensive heart disease with heart failure: Secondary | ICD-10-CM | POA: Diagnosis not present

## 2020-11-11 DIAGNOSIS — Z8249 Family history of ischemic heart disease and other diseases of the circulatory system: Secondary | ICD-10-CM

## 2020-11-11 DIAGNOSIS — Z6841 Body Mass Index (BMI) 40.0 and over, adult: Secondary | ICD-10-CM | POA: Diagnosis not present

## 2020-11-11 DIAGNOSIS — K5090 Crohn's disease, unspecified, without complications: Secondary | ICD-10-CM | POA: Diagnosis not present

## 2020-11-11 LAB — CBC (CANCER CENTER ONLY)
HCT: 36.6 % (ref 36.0–46.0)
Hemoglobin: 11.4 g/dL — ABNORMAL LOW (ref 12.0–15.0)
MCH: 27.5 pg (ref 26.0–34.0)
MCHC: 31.1 g/dL (ref 30.0–36.0)
MCV: 88.2 fL (ref 80.0–100.0)
Platelet Count: 285 10*3/uL (ref 150–400)
RBC: 4.15 MIL/uL (ref 3.87–5.11)
RDW: 14.5 % (ref 11.5–15.5)
WBC Count: 4.4 10*3/uL (ref 4.0–10.5)
nRBC: 0 % (ref 0.0–0.2)

## 2020-11-11 LAB — CMP (CANCER CENTER ONLY)
ALT: 16 U/L (ref 0–44)
AST: 23 U/L (ref 15–41)
Albumin: 3.5 g/dL (ref 3.5–5.0)
Alkaline Phosphatase: 74 U/L (ref 38–126)
Anion gap: 7 (ref 5–15)
BUN: 24 mg/dL — ABNORMAL HIGH (ref 6–20)
CO2: 29 mmol/L (ref 22–32)
Calcium: 9.6 mg/dL (ref 8.9–10.3)
Chloride: 99 mmol/L (ref 98–111)
Creatinine: 1.03 mg/dL — ABNORMAL HIGH (ref 0.44–1.00)
GFR, Estimated: >60 mL/min (ref >60)
Glucose, Bld: 77 mg/dL (ref 70–99)
Potassium: 3.6 mmol/L (ref 3.5–5.1)
Sodium: 135 mmol/L (ref 135–145)
Total Bilirubin: 0.6 mg/dL (ref 0.3–1.2)
Total Protein: 8.6 g/dL — ABNORMAL HIGH (ref 6.5–8.1)

## 2020-11-11 LAB — SAVE SMEAR(SSMR), FOR PROVIDER SLIDE REVIEW

## 2020-11-11 LAB — D-DIMER, QUANTITATIVE: D-Dimer, Quant: 2.58 ug/mL-FEU — ABNORMAL HIGH (ref 0.00–0.50)

## 2020-11-11 LAB — LACTATE DEHYDROGENASE: LDH: 134 U/L (ref 98–192)

## 2020-11-11 NOTE — Progress Notes (Signed)
Hematology/Oncology Consultation   Name: [name]      MRN: [id number]    Location: Room/bed info not found  Date: 11/11/2020 Time:8:55 AM   REFERRING PHYSICIAN: Hart Carwin P. 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 She thinks she was treated with Heparin IV during admission and transitioned to Eliquis at discharge which she took for several months. She had no prior history of thrombotic event and has had no issue since.  She states that she had repeat imaging which showed resolution of PE's and DVT.  She is not currently on aspirin or anticoagulation.  She had not done any travelling. No injury or surgery.  Her father also had history of lower extremity DVT's. No history of hormone replacement therapy.  PCP checked prothrombin antibody, protein C total, protein S total, Antithrombin III and factor V leiden and all were negative.  Maternal grandmother and aunts with history of stroke.  No smoking, ETOH or recreational drug use.  No history of diabetes or thyroid disease.  No blood loss noted. No bruising or petechiae.  She was diagnosed with melanoma of the left calf in 2003. She states that this was treated with surgery only.  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 She was on chemotherapy 03/26/2015-05/29/2015. She states that she was only able to complete 3 out of 6 cycles due to side effects and intolerance. She completed her 5 years of surveillance with Denman George in 05/2020. She was also seen by Dr. Godfrey Pick.  She states that her cycles previous to surgery were regular with heavy flow. She had a lot of PMS pain. She states that she also had  history of uterine fibroids with D&C. No fever, chills, n/v, cough, rash, dizziness, SOB, chest pain, palpitations, abdominal pain or changes in bowel or bladder habits.  No swelling, tenderness, numbness or tingling in her extremities at this time.  She has occasional swelling in her right left secondary to previous lymph node removal and also avoids salt intake to reduce fluid retention.  No falls or syncope to report.  She states that she has been eating well and staying properly hydrated. Her weight is stable at 253 lbs.  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   ROS: All other 10 point review of systems is negative.   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  The Custom gene panel offered by [facility] includes sequencing and rearrangement analysis for the following 24 genes:  ATM, BAP1, BARD1, BRCA1, BRCA2, BRIP1, CDH1, CDK4, CDKN2A, CHEK2, EPCAM, FANCC, MITF, MLH1, MSH2, MSH6, NBN, PALB2, PMS2,  PTEN, RAD51C, RAD51D, TP53, and XRCC2.   The report date is February 13, 2016.   GERD (gastroesophageal reflux disease)    Heart murmur    History of melanoma excision 03/23/2015   Hypertension    Leukopenia due to antineoplastic chemotherapy ([place]) 04/16/2015   Melanoma ([place]) dx'd 2003   right leg   Morbid obesity with BMI of 50.0-59.9, adult ([place]) 03/23/2015   OPEN WOUND OF KNEE LEG AND ANKLE COMPLICATED 67/67/2094   Qualifier: Diagnosis of  By: Patsy Baltimore RN, Denise     ovarian  ca dx'd 10/216   Seasonal allergies    Umbilical hernia without obstruction and without gangrene 03/23/2015   Urinary, incontinence, stress female 10/27/2016    ALLERGIES: Allergies  Allergen Reactions   Levofloxacin Palpitations    REACTION: tachycardia   Moxifloxacin Palpitations    REACTION: tachycardia but tolerated cirpofloxacin just fine   Stadol [Butorphanol] Other (See Comments)    Caused shakes for 6 weeks "shaking for weeks"   Amoxicillin Rash    Can take 500 mg - amounts greater causes yeast infection REACTION: Rash with high doses. Can take lower doses like 530m   Asa [Aspirin] Other (See Comments)    Pt stated the 81 mg caused Exacerbation of her asthma  - unsure about other asprins Asthma exacerbation   Ciprofloxacin Hcl Nausea And Vomiting    Can take lower doses like 5051m Other causes yeast infection Can take lower doses like 50086m  Codeine Nausea Only and Nausea And Vomiting    REACTION: nausea   Effexor [Venlafaxine] Other (See Comments)    hyperactivity Panic attacks   Erythromycin Diarrhea and Nausea And Vomiting   Latex Rash   Paroxetine Other (See Comments)    Made nerves worse and caused fatigue   Butorphanol Tartrate     Gave her the shakes for 6 weeks   Cephalosporins     REACTION: she has tolerated rocephin and Keflex without difficulty   Clindamycin/Lincomycin Other (See Comments)    Tears stomach up.   Influenza Vaccines     Pt states that she got really sick from flu vaccine was sick a total of 6 weeks   Losartan Other (See Comments)    Headache, nausea, fatigue   Paxil [Paroxetine Hcl] Other (See Comments)    fatigue   Clarithromycin Diarrhea   Clindamycin Diarrhea   Tape Rash      MEDICATIONS:  Current Outpatient Medications on File Prior to Visit  Medication Sig Dispense Refill   albuterol (PROVENTIL) (2.5 MG/3ML) 0.083% nebulizer solution Take 3 mLs (2.5 mg total) by nebulization every 6 (six) hours as needed for wheezing or  shortness of breath. 150 mL 1   albuterol (VENTOLIN HFA) 108 (90 Base) MCG/ACT inhaler Inhale 1-2 puffs into the lungs every 6 (six) hours as needed for wheezing or shortness of breath. 18 g 2   ALPRAZolam (XANAX) 1 MG tablet Take 0.5-1 tablets (0.5-1 mg total) by mouth at bedtime as needed for anxiety. 45 tablet 1   amLODipine (NORVASC) 2.5 MG tablet Take 1 tablet (2.5 mg total) by mouth in the morning and at bedtime. 180 tablet 2   Ascorbic Acid (VITAMIN C) 1000 MG tablet Take 1,000 mg by mouth daily.     budesonide-formoterol (SYMBICORT) 160-4.5 MCG/ACT inhaler Inhale 2 puffs into the lungs 2 (two) times daily. 10.2 g 5   budesonide-formoterol (SYMBICORT) 160-4.5 MCG/ACT inhaler Inhale 2 puffs into the lungs 2 (two) times daily. 10.2 g 5  Calcium Carbonate-Vit D-Min (CALCIUM 1200 PO) Take 1 tablet by mouth daily.     diclofenac Sodium (VOLTAREN) 1 % GEL Apply 2 g topically 4 (four) times daily. 100 g 2   ELDERBERRY PO Take 1,250 mg by mouth daily.     Lactobacillus (ACIDOPHILUS PO) Take 2 capsules by mouth daily.      levocetirizine (XYZAL) 5 MG tablet Take 1 tablet (5 mg total) by mouth every evening. 30 tablet 2   loratadine (CLARITIN) 10 MG tablet Take 1 tablet (10 mg total) by mouth daily. 90 tablet 3   losartan (COZAAR) 25 MG tablet TAKE 1 TABLET BY MOUTH DAILY 90 tablet 1   Nebulizers MISC 1 Units by Misc.(Non-Drug; Combo Route) route every 8 (eight) hours as needed.     Omega-3 Fatty Acids (FISH OIL) 1000 MG CAPS Take 2 capsules by mouth daily. Reported on 06/26/2015     pantoprazole (PROTONIX) 40 MG tablet Take by mouth.     promethazine (PHENERGAN) 25 MG tablet Take 1 tablet (25 mg total) by mouth every 6 (six) hours as needed for nausea or vomiting. 90 tablet 1   sertraline (ZOLOFT) 50 MG tablet Take 1 tablet (50 mg total) by mouth daily. 90 tablet 3   triamterene-hydrochlorothiazide (MAXZIDE) 75-50 MG tablet Take 1 tablet by mouth daily. Reported on 06/12/2015 90 tablet 3   VITAMIN E  PO Take by mouth daily.     No current facility-administered medications on file prior to visit.     PAST SURGICAL HISTORY Past Surgical History:  Procedure Laterality Date   ABDOMINAL HYSTERECTOMY Bilateral 02/11/2015   Procedure: HYSTERECTOMY ABDOMINAL, bilateral salpingo-oophorectomy;  Surgeon: Louretta Shorten, MD;  Location: [facility];  Service: Gynecology;  Laterality: Bilateral;   COLONOSCOPY WITH ESOPHAGOGASTRODUODENOSCOPY (EGD)     DILATATION & CURETTAGE/HYSTEROSCOPY WITH TRUECLEAR N/A 08/17/2013   Procedure: DILATATION & CURETTAGE/HYSTEROSCOPY WITH TRUCLEAR;  Surgeon: Luz Lex, MD;  Location: [facility];  Service: Gynecology;  Laterality: N/A;   IR REMOVAL TUN ACCESS W/ PORT W/O FL MOD SED  07/05/2017   MELANOMA EXCISION  06/2001   right leg; also removed 2 lymph nodes   OMENTECTOMY N/A 02/11/2015   Procedure: OMENTECTOMY;  Surgeon: Louretta Shorten, MD;  Location: [facility];  Service: Gynecology;  Laterality: N/A;   UMBILICAL HERNIA REPAIR      FAMILY HISTORY: Family History  Problem Relation Age of Onset   Colon polyps Father    Diabetes Father    Heart disease Paternal Uncle    Asthma Mother    Melanoma Maternal Aunt    Diabetes Paternal Grandmother    Breast cancer Cousin        maternal first cousin   Colon cancer Neg Hx     SOCIAL HISTORY:  reports that she has never smoked. She has never used smokeless tobacco. She reports that she does not drink alcohol and does not use drugs.  PERFORMANCE STATUS: The patient's performance status is 0 - Asymptomatic  PHYSICAL EXAM: Most Recent Vital Signs: There were no vitals taken for this visit. LMP  (LMP Unknown)   General Appearance:    Alert, cooperative, no distress, appears stated age  Head:    Normocephalic, without obvious abnormality, atraumatic  Eyes:    PERRL, conjunctiva/corneas clear, EOM's intact, fundi    benign, both eyes        Throat:   Lips, mucosa, and tongue normal; teeth and gums normal  Neck:   Supple,  symmetrical, trachea midline, no adenopathy;  thyroid:  no enlargement/tenderness/nodules; no carotid   bruit or JVD  Back:     Symmetric, no curvature, ROM normal, no CVA tenderness  Lungs:     Clear to auscultation bilaterally, respirations unlabored  Chest Wall:    No tenderness or deformity   Heart:    Regular rate and rhythm, S1 and S2 normal, no murmur, rub   or gallop     Abdomen:     Soft, non-tender, bowel sounds active all four quadrants,    no masses, no organomegaly        Extremities:   Extremities normal, atraumatic, no cyanosis or edema  Pulses:   2+ and symmetric all extremities  Skin:   Skin color, texture, turgor normal, no rashes or lesions  Lymph nodes:   Cervical, supraclavicular, and axillary nodes normal  Neurologic:   CNII-XII intact, normal strength, sensation and reflexes    throughout    LABORATORY DATA:  No results found for this or any previous visit (from the past 48 hour(s)).    RADIOGRAPHY: No results found.     PATHOLOGY: None  ASSESSMENT/PLAN: Ms. Peeler is a very pleasant 60 yo caucasian female with history of DVT and PE diagnosed in 2019. No prior history of thrombotic event and no recurrence since.  The rest of her hyper coag work up was negative.   I discussed restarting anticoagulation with Dr. Marin Olp and since she had no thrombotic events prior to or since her diagnosis in 2019 we will have her start taking 2 baby aspirin daily with food.  All questions were answered. We will now follow up as needed. She can contact our office with any problems, questions or concerns. We can certainly see her any time she needs [place] in the future.   The patient was discussed with Dr. Marin Olp and he is in agreement with the aforementioned.   Laverna Peace, NP

## 2020-11-12 ENCOUNTER — Telehealth: Payer: Self-pay | Admitting: *Deleted

## 2020-11-12 LAB — HOMOCYSTEINE: Homocysteine: 12.8 umol/L (ref 0.0–14.5)

## 2020-11-12 NOTE — Telephone Encounter (Signed)
No 11/11/20 LOS

## 2020-11-13 LAB — CARDIOLIPIN ANTIBODIES, IGG, IGM, IGA
Anticardiolipin IgA: 10 APL U/mL (ref 0–11)
Anticardiolipin IgG: <9 GPL U/mL (ref 0–14)
Anticardiolipin IgM: <9 MPL U/mL (ref 0–12)

## 2020-11-14 ENCOUNTER — Telehealth: Payer: Self-pay | Admitting: Family

## 2020-11-14 ENCOUNTER — Encounter: Payer: Self-pay | Admitting: Oncology

## 2020-11-14 LAB — LUPUS ANTICOAGULANT PANEL
DRVVT: 37.1 s (ref 0.0–47.0)
PTT Lupus Anticoagulant: 30 s (ref 0.0–51.9)

## 2020-11-14 NOTE — Telephone Encounter (Signed)
I was able to go over [name]'s hyper coag studies with her. Her results were negative. She is doing well on 1 baby aspirin daily and we will have her increase now to two baby aspirin daily with snack.  She will follow-up with [place] as needed. We can see her for any questions or concerns in the future.  No questions at this time. Patient appreciative of call.

## 2020-11-26 ENCOUNTER — Telehealth: Payer: Self-pay | Admitting: Family Medicine

## 2020-11-26 NOTE — Telephone Encounter (Signed)
Left message for patient to call back and schedule Medicare Annual Wellness Visit (AWV) in office.   If not able to come in office, please offer to do virtually or by telephone.  Left office number and my jabber [phone].  Due for AWVI  Please schedule at anytime with Nurse Health Advisor.

## 2020-12-03 DIAGNOSIS — H25093 Other age-related incipient cataract, bilateral: Secondary | ICD-10-CM | POA: Diagnosis not present

## 2020-12-03 DIAGNOSIS — H40052 Ocular hypertension, left eye: Secondary | ICD-10-CM | POA: Diagnosis not present

## 2020-12-11 DIAGNOSIS — H2513 Age-related nuclear cataract, bilateral: Secondary | ICD-10-CM | POA: Diagnosis not present

## 2020-12-11 DIAGNOSIS — H40013 Open angle with borderline findings, low risk, bilateral: Secondary | ICD-10-CM | POA: Diagnosis not present

## 2020-12-11 DIAGNOSIS — H18413 Arcus senilis, bilateral: Secondary | ICD-10-CM | POA: Diagnosis not present

## 2020-12-11 DIAGNOSIS — H25043 Posterior subcapsular polar age-related cataract, bilateral: Secondary | ICD-10-CM | POA: Diagnosis not present

## 2020-12-30 DIAGNOSIS — D2261 Melanocytic nevi of right upper limb, including shoulder: Secondary | ICD-10-CM | POA: Diagnosis not present

## 2020-12-30 DIAGNOSIS — D2262 Melanocytic nevi of left upper limb, including shoulder: Secondary | ICD-10-CM | POA: Diagnosis not present

## 2020-12-30 DIAGNOSIS — L718 Other rosacea: Secondary | ICD-10-CM | POA: Diagnosis not present

## 2020-12-30 DIAGNOSIS — Z85820 Personal history of malignant melanoma of skin: Secondary | ICD-10-CM | POA: Diagnosis not present

## 2021-01-08 ENCOUNTER — Encounter: Payer: Self-pay | Admitting: Family Medicine

## 2021-01-08 ENCOUNTER — Telehealth (INDEPENDENT_AMBULATORY_CARE_PROVIDER_SITE_OTHER): Payer: Medicare Other | Admitting: Family Medicine

## 2021-01-08 ENCOUNTER — Other Ambulatory Visit: Payer: Self-pay

## 2021-01-08 DIAGNOSIS — U071 COVID-19: Secondary | ICD-10-CM | POA: Diagnosis not present

## 2021-01-08 MED ORDER — BENZONATATE 200 MG PO CAPS: 200.0000 mg | ORAL_CAPSULE | Freq: Two times a day (BID) | ORAL | 0 refills | Status: DC | PRN

## 2021-01-08 MED ORDER — MOLNUPIRAVIR EUA 200MG CAPSULE: 4.0000 | ORAL_CAPSULE | Freq: Two times a day (BID) | ORAL | 0 refills | Status: AC

## 2021-01-08 MED ORDER — METHYLPREDNISOLONE 4 MG PO TBPK: ORAL_TABLET | ORAL | 0 refills | Status: DC

## 2021-01-08 NOTE — Progress Notes (Signed)
CC: Covid   [name] here for URI complaints. Due to COVID-19 pandemic, we are interacting via web telephone. I verified patient's ID using 2 identifiers. Patient agreed to proceed with visit via this method. Patient is at home, I am at office. Patient, her spouse and I are present for visit.   Duration: 3 days  Associated symptoms: sinus congestion, sinus pain, rhinorrhea, sore throat, wheezing, shortness of breath, and coughing Denies: itchy watery eyes, ear pain, ear drainage, myalgia, and fevers, N/V/D, loss of taste/smell Treatment to date: neb tx's Sick contacts: Yes; spouse Tested + on 9/26  Past Medical History:  Diagnosis Date   Anemia due to antineoplastic chemotherapy 04/16/2015   Anxiety state 06/01/2007   Qualifier: Diagnosis of  By: Ronnald Ramp RN, CGRN, Sheri     Asthma    Asymptomatic proteinuria    Intermittent   CHF (congestive heart failure) (HCC)    COPD (chronic obstructive pulmonary disease) ([place])    Crohn's disease without complication ([place]) 58/44/1712   Depression    Genetic testing 02/17/2016   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  The Custom gene panel offered by [facility] includes sequencing and rearrangement analysis for the following 24 genes:  ATM, BAP1, BARD1, BRCA1, BRCA2, BRIP1, CDH1, CDK4, CDKN2A, CHEK2, EPCAM, FANCC, MITF, MLH1, MSH2, MSH6, NBN, PALB2, PMS2, PTEN, RAD51C, RAD51D, TP53, and XRCC2.   The report date is February 13, 2016.   GERD (gastroesophageal reflux disease)    Heart murmur    History of melanoma excision 03/23/2015   Hypertension    Leukopenia due to antineoplastic chemotherapy ([place]) 04/16/2015   Melanoma ([place]) dx'd 2003   right leg   Morbid obesity with BMI of 50.0-59.9, adult ([place]) 03/23/2015   OPEN WOUND OF KNEE LEG AND ANKLE COMPLICATED 78/71/8367   Qualifier: Diagnosis of  By: Patsy Baltimore RN, Denise     ovarian ca dx'd 10/216   Seasonal allergies    Umbilical hernia without obstruction and without gangrene  03/23/2015   Urinary, incontinence, stress female 10/27/2016    Objective No conversational dyspnea Age appropriate judgment and insight Nml affect and mood  COVID-19 - Plan: benzonatate (TESSALON) 200 MG capsule, methylPREDNISolone (MEDROL DOSEPAK) 4 MG TBPK tablet, molnupiravir EUA (LAGEVRIO) 200 mg CAPS capsule  Given asthma hx, will tx w Medrol Dosepak and antiviral. Benzonatate prn. Discussed quarantining guidelines.  Continue to push fluids, practice good hand hygiene, cover mouth when coughing. F/u prn. If starting to experience worsening ss's, irreplaceable fluid loss, shaking, or shortness of breath, seek immediate care. Total time: 12 min Pt voiced understanding and agreement to the plan.  [place], DO 01/08/21 10:50 AM

## 2021-01-12 ENCOUNTER — Telehealth: Payer: Self-pay | Admitting: Family Medicine

## 2021-01-12 MED ORDER — METHYLPREDNISOLONE 4 MG PO TABS: 20.0000 mg | ORAL_TABLET | Freq: Every day | ORAL | 0 refills | Status: DC

## 2021-01-12 NOTE — Telephone Encounter (Signed)
Covid symptoms have not gotten any better since she got covid 10/03.She did not take her antiviral and needs advice on what to do. Please advice.

## 2021-01-12 NOTE — Telephone Encounter (Signed)
Called her back as Dr Viona Gilmore is OOT She notes that both she and her husband had covid last week- she tested positive on 9/26- symptoms for about 6 days  She is no longer running a fever She notes that she tends to get  She is taking her steroid pack- she has 2 days left  She did not start taking Mulnupiravir when it was prescribed.  She now wants to know what she should do.  She does have the mulnupiravir at home-  I advised her to go ahead and start taking this as it may be helpful, though it is towards the outside of the treatment window.  She wonders what she will do if her symptoms get worse after she finishes her Medrol Dosepak, she wants to have more steroids on hold.  I called in prescription for as below.  She also then mentions that her husband also has questions and wants me to consult for him as well.  See separate phone note.  15-minute phone conversation  Meds ordered this encounter  Medications   methylPREDNISolone (MEDROL) 4 MG tablet    Sig: Take 5 tablets (20 mg total) by mouth daily. Take for 5 days as needed for asthma exacerbation    Dispense:  25 tablet    Refill:  0

## 2021-01-13 ENCOUNTER — Telehealth: Payer: Medicare Other | Admitting: Internal Medicine

## 2021-01-13 ENCOUNTER — Other Ambulatory Visit: Payer: Self-pay

## 2021-01-13 ENCOUNTER — Telehealth: Payer: Self-pay | Admitting: Family Medicine

## 2021-01-13 NOTE — Telephone Encounter (Signed)
Patient states she was supposed to get her methylPREDNISolone (MEDROL) 4 MG tablet in a pack where it tells her when and how many to take. She would like the prescription be resent in that pack, not the bottle. Please advice  [facility], [address]  [address], [place] [address]  Phone:  [phone]  Fax:  [phone]

## 2021-01-14 NOTE — Telephone Encounter (Signed)
This was written as: Take 5 tablets (20 mg total) by mouth daily. Take for 5 days as needed for asthma exacerbation  It doesn't necessarily need to be in a pack does it?

## 2021-01-14 NOTE — Telephone Encounter (Signed)
No- it does not need to be in the pack. Please let her know I did not use the pack this time so we can customize her dose (since she has already been on steroids for several days per Dr Nani Ravens) If they have other concerns we ae glad to see them in the office Please give her a call

## 2021-01-15 ENCOUNTER — Other Ambulatory Visit: Payer: Self-pay

## 2021-01-15 ENCOUNTER — Telehealth: Payer: Self-pay | Admitting: Family Medicine

## 2021-01-15 DIAGNOSIS — J4540 Moderate persistent asthma, uncomplicated: Secondary | ICD-10-CM

## 2021-01-15 MED ORDER — METHYLPREDNISOLONE 4 MG PO TABS: 20.0000 mg | ORAL_TABLET | Freq: Every day | ORAL | 0 refills | Status: DC

## 2021-01-15 MED ORDER — BUDESONIDE-FORMOTEROL FUMARATE 160-4.5 MCG/ACT IN AERO: 2.0000 | INHALATION_SPRAY | Freq: Two times a day (BID) | RESPIRATORY_TRACT | 5 refills | Status: DC

## 2021-01-15 NOTE — Telephone Encounter (Signed)
[facility] 

## 2021-01-15 NOTE — Telephone Encounter (Signed)
Patient states she is having trouble with her [facility] pharmacy, and would like this [facility]   Medication: methylPREDNISolone (MEDROL) 4 MG tablet  Has the patient contacted their pharmacy? Yes.   (If no, request that the patient contact the pharmacy for the refill.) (If yes, when and what did the pharmacy advise?)  Preferred Pharmacy (with phone number or street name):  [facility] [address] [place],  [address] Phone: [phone]  Agent: Please be advised that RX refills may take up to 3 business days. We ask that you follow-up with your pharmacy.

## 2021-01-15 NOTE — Telephone Encounter (Signed)
Pt aware and voices understanding.   

## 2021-01-16 ENCOUNTER — Telehealth: Payer: Self-pay | Admitting: Family Medicine

## 2021-01-16 DIAGNOSIS — J4540 Moderate persistent asthma, uncomplicated: Secondary | ICD-10-CM

## 2021-01-16 NOTE — Telephone Encounter (Signed)
Medication was sent to the wrong pharmacy, [facility]   Medication: budesonide-formoterol (SYMBICORT) 160-4.5 MCG/ACT inhaler  Has the patient contacted their pharmacy? No. (If no, request that the patient contact the pharmacy for the refill.) (If yes, when and what did the pharmacy advise?)  Preferred Pharmacy (with phone number or street name): [id number] [id number] Phone:  [phone] Fax:  [phone] Agent: Please be advised that RX refills may take up to 3 business days. We ask that you follow-up with your pharmacy.

## 2021-01-19 MED ORDER — BUDESONIDE-FORMOTEROL FUMARATE 160-4.5 MCG/ACT IN AERO
2.0000 | INHALATION_SPRAY | Freq: Two times a day (BID) | RESPIRATORY_TRACT | 5 refills | Status: DC
Start: 2021-01-19 — End: 2021-03-10

## 2021-01-22 ENCOUNTER — Telehealth: Payer: Self-pay | Admitting: Family Medicine

## 2021-01-22 NOTE — Telephone Encounter (Signed)
Medication: methylPREDNISolone (MEDROL) 4 MG tablet     Has the patient contacted their pharmacy? No. (If no, request that the patient contact the pharmacy for the refill.) (If yes, when and what did the pharmacy advise?)    Preferred Pharmacy (with phone number or street name):  [id number] Phone:  [phone] Fax:  [phone] Meds were prescribed by [place]

## 2021-01-28 MED ORDER — METHYLPREDNISOLONE 4 MG PO TABS: 20.0000 mg | ORAL_TABLET | Freq: Every day | ORAL | 0 refills | Status: DC

## 2021-01-28 NOTE — Telephone Encounter (Signed)
Patient is still wheezing and coughing.  Would like a refill

## 2021-01-28 NOTE — Telephone Encounter (Signed)
Pt. Called in and stated she hasnt heard anything back regarding the refill and wants to know what options she has because she is still dealing with a bad cough from covid

## 2021-01-30 ENCOUNTER — Other Ambulatory Visit: Payer: Self-pay | Admitting: Family Medicine

## 2021-01-30 MED ORDER — METHYLPREDNISOLONE 4 MG PO TABS: 20.0000 mg | ORAL_TABLET | Freq: Every day | ORAL | 0 refills | Status: DC

## 2021-02-10 ENCOUNTER — Encounter: Payer: Medicare Other | Admitting: Family Medicine

## 2021-02-25 DIAGNOSIS — I2510 Atherosclerotic heart disease of native coronary artery without angina pectoris: Secondary | ICD-10-CM | POA: Diagnosis not present

## 2021-02-25 DIAGNOSIS — H269 Unspecified cataract: Secondary | ICD-10-CM | POA: Diagnosis not present

## 2021-02-25 DIAGNOSIS — Z886 Allergy status to analgesic agent status: Secondary | ICD-10-CM | POA: Diagnosis not present

## 2021-02-25 DIAGNOSIS — H25092 Other age-related incipient cataract, left eye: Secondary | ICD-10-CM | POA: Diagnosis not present

## 2021-02-25 DIAGNOSIS — Z90710 Acquired absence of both cervix and uterus: Secondary | ICD-10-CM | POA: Diagnosis not present

## 2021-02-25 DIAGNOSIS — Z881 Allergy status to other antibiotic agents status: Secondary | ICD-10-CM | POA: Diagnosis not present

## 2021-02-25 DIAGNOSIS — F419 Anxiety disorder, unspecified: Secondary | ICD-10-CM | POA: Diagnosis not present

## 2021-02-25 DIAGNOSIS — H2512 Age-related nuclear cataract, left eye: Secondary | ICD-10-CM | POA: Diagnosis not present

## 2021-02-25 DIAGNOSIS — M1990 Unspecified osteoarthritis, unspecified site: Secondary | ICD-10-CM | POA: Diagnosis not present

## 2021-02-25 DIAGNOSIS — K449 Diaphragmatic hernia without obstruction or gangrene: Secondary | ICD-10-CM | POA: Diagnosis not present

## 2021-02-25 DIAGNOSIS — Z86711 Personal history of pulmonary embolism: Secondary | ICD-10-CM | POA: Diagnosis not present

## 2021-02-25 DIAGNOSIS — Z885 Allergy status to narcotic agent status: Secondary | ICD-10-CM | POA: Diagnosis not present

## 2021-02-25 DIAGNOSIS — J45909 Unspecified asthma, uncomplicated: Secondary | ICD-10-CM | POA: Diagnosis not present

## 2021-02-25 DIAGNOSIS — K219 Gastro-esophageal reflux disease without esophagitis: Secondary | ICD-10-CM | POA: Diagnosis not present

## 2021-02-25 DIAGNOSIS — L231 Allergic contact dermatitis due to adhesives: Secondary | ICD-10-CM | POA: Diagnosis not present

## 2021-02-25 DIAGNOSIS — Z880 Allergy status to penicillin: Secondary | ICD-10-CM | POA: Diagnosis not present

## 2021-02-25 DIAGNOSIS — I10 Essential (primary) hypertension: Secondary | ICD-10-CM | POA: Diagnosis not present

## 2021-02-26 DIAGNOSIS — H2511 Age-related nuclear cataract, right eye: Secondary | ICD-10-CM | POA: Diagnosis not present

## 2021-03-09 ENCOUNTER — Ambulatory Visit (INDEPENDENT_AMBULATORY_CARE_PROVIDER_SITE_OTHER): Payer: Medicare Other | Admitting: Family Medicine

## 2021-03-09 ENCOUNTER — Other Ambulatory Visit: Payer: Self-pay

## 2021-03-09 ENCOUNTER — Encounter: Payer: Self-pay | Admitting: Family Medicine

## 2021-03-09 VITALS — BP 128/82 | HR 73 | Temp 98.6°F | Ht 64.0 in | Wt 256.0 lb

## 2021-03-09 DIAGNOSIS — I10 Essential (primary) hypertension: Secondary | ICD-10-CM

## 2021-03-09 DIAGNOSIS — Z0000 Encounter for general adult medical examination without abnormal findings: Secondary | ICD-10-CM | POA: Diagnosis not present

## 2021-03-09 DIAGNOSIS — R748 Abnormal levels of other serum enzymes: Secondary | ICD-10-CM | POA: Diagnosis not present

## 2021-03-09 DIAGNOSIS — E2839 Other primary ovarian failure: Secondary | ICD-10-CM | POA: Diagnosis not present

## 2021-03-09 MED ORDER — PANTOPRAZOLE SODIUM 40 MG PO TBEC: 40.0000 mg | DELAYED_RELEASE_TABLET | Freq: Every day | ORAL | 2 refills | Status: DC

## 2021-03-09 MED ORDER — ALPRAZOLAM 1 MG PO TABS: 0.5000 mg | ORAL_TABLET | Freq: Every evening | ORAL | 1 refills | Status: DC | PRN

## 2021-03-09 NOTE — Patient Instructions (Addendum)
Give [place] 2-3 business days to get the results of your labs back.   Keep the diet clean and stay active.  I recommend getting the updated bivalent covid vaccination booster, 90 days after the last day of symptoms, at your convenience.   The new Shingrix vaccine (for shingles) is a 2 shot series. It can make people feel low energy, achy and almost like they have the flu for 48 hours after injection. Please plan accordingly when deciding on when to get this shot. Call our office for a nurse visit appointment to get this. The second shot of the series is less severe regarding the side effects, but it still lasts 48 hours.   Take 1200 mg of calcium daily and at least 1000 units of vitamin D3 daily.   Let us know if you need anything.

## 2021-03-09 NOTE — Progress Notes (Signed)
Chief Complaint  Patient presents with   Follow-up    6 month     Well Woman [name] is here for a complete physical.   Her last physical was >1 year ago.  Current diet: in general, diet is fair. Current exercise: walking, wt lifting. Weight is stable and she denies fatigue out of ordinary. Patient has had a hysterectomy.  Seatbelt? 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  The Custom gene panel offered by [facility] includes sequencing and rearrangement analysis for the following 24 genes:  ATM, BAP1, BARD1, BRCA1, BRCA2, BRIP1, CDH1, CDK4, CDKN2A, CHEK2, EPCAM, FANCC, MITF, MLH1, MSH2, MSH6, NBN, PALB2, PMS2, PTEN, RAD51C, RAD51D, TP53, and XRCC2.   The report date is February 13, 2016.   GERD (gastroesophageal reflux disease)    Heart murmur    History of melanoma excision 03/23/2015   Hypertension    Leukopenia due to antineoplastic chemotherapy ([place]) 04/16/2015   Melanoma ([place]) dx'd 2003   right leg   Morbid obesity with BMI of 50.0-59.9, adult ([facility]) 03/23/2015   OPEN WOUND OF KNEE LEG AND ANKLE COMPLICATED 96/22/2979   Qualifier: Diagnosis of  By: Patsy Baltimore RN, Denise     ovarian ca dx'd 10/216   Seasonal allergies    Umbilical hernia without obstruction and without gangrene 03/23/2015   Urinary, incontinence, stress female 10/27/2016     Past Surgical History:  Procedure Laterality Date    ABDOMINAL HYSTERECTOMY Bilateral 02/11/2015   Procedure: HYSTERECTOMY ABDOMINAL, bilateral salpingo-oophorectomy;  Surgeon: Louretta Shorten, MD;  Location: [facility];  Service: Gynecology;  Laterality: Bilateral;   COLONOSCOPY WITH ESOPHAGOGASTRODUODENOSCOPY (EGD)     DILATATION & CURETTAGE/HYSTEROSCOPY WITH TRUECLEAR N/A 08/17/2013   Procedure: DILATATION & CURETTAGE/HYSTEROSCOPY WITH TRUCLEAR;  Surgeon: Luz Lex, MD;  Location: [facility];  Service: Gynecology;  Laterality: N/A;   IR REMOVAL TUN ACCESS W/ PORT W/O FL MOD SED  07/05/2017   MELANOMA EXCISION  06/2001   right leg; also removed 2 lymph nodes   OMENTECTOMY N/A 02/11/2015   Procedure: OMENTECTOMY;  Surgeon: Louretta Shorten, MD;  Location: [facility];  Service: Gynecology;  Laterality: N/A;   UMBILICAL HERNIA REPAIR      Medications  Current Outpatient Medications on File Prior to Visit  Medication Sig Dispense Refill   albuterol (PROVENTIL) (2.5 MG/3ML) 0.083% nebulizer solution Take 3 mLs (2.5 mg total) by nebulization every 6 (six) hours as needed for wheezing or shortness of breath. 150 mL 1   albuterol (VENTOLIN HFA) 108 (90 Base) MCG/ACT inhaler Inhale 1-2 puffs into the lungs every 6 (six) hours as needed for wheezing or shortness of breath. 18 g 2   ALPRAZolam (XANAX) 1 MG tablet Take 0.5-1 tablets (0.5-1 mg total) by mouth at bedtime as needed for anxiety. 45 tablet 1   amLODipine (NORVASC) 2.5 MG tablet Take 1 tablet (2.5 mg total) by mouth in the morning  and at bedtime. 180 tablet 2   benzonatate (TESSALON) 200 MG capsule Take 1 capsule (200 mg total) by mouth 2 (two) times daily as needed for cough. 20 capsule 0   budesonide-formoterol (SYMBICORT) 160-4.5 MCG/ACT inhaler Inhale 2 puffs into the lungs 2 (two) times daily. 10.2 g 5   diclofenac Sodium (VOLTAREN) 1 % GEL Apply 2 g topically 4 (four) times daily. 100 g 2   ELDERBERRY PO Take 1,250 mg by mouth daily.     Lactobacillus (ACIDOPHILUS PO) Take 2 capsules by mouth daily.       methylPREDNISolone (MEDROL) 4 MG tablet Take 5 tablets (20 mg total) by mouth daily. Take for 5 days as needed for asthma exacerbation 25 tablet 0   Nebulizers MISC 1 Units by Misc.(Non-Drug; Combo Route) route every 8 (eight) hours as needed.     sertraline (ZOLOFT) 50 MG tablet Take 1 tablet (50 mg total) by mouth daily. 90 tablet 3   triamterene-hydrochlorothiazide (MAXZIDE) 75-50 MG tablet Take 1 tablet by mouth daily. Reported on 06/12/2015 90 tablet 3   VITAMIN E PO Take by mouth daily.     pantoprazole (PROTONIX) 40 MG tablet Take by mouth.     Allergies Allergies  Allergen Reactions   Butorphanol Tartrate Other (See Comments)    Gave her the shakes for 6 weeks   Levofloxacin Palpitations    REACTION: tachycardia   Moxifloxacin Palpitations    REACTION: tachycardia but tolerated cirpofloxacin just fine   Stadol [Butorphanol] Other (See Comments)    Caused shakes for 6 weeks "shaking for weeks"   Amoxicillin Rash    REACTION: Rash with high doses. Can take lower doses like 541m   Asa [Aspirin] Other (See Comments)    Pt stated the 81 mg caused Exacerbation of her asthma    Ciprofloxacin Hcl Nausea And Vomiting    Can take lower doses like 5086m   Clindamycin/Lincomycin Nausea And Vomiting   Codeine Nausea And Vomiting   Effexor [Venlafaxine] Other (See Comments)    hyperactivity Panic attacks   Erythromycin Diarrhea and Nausea And Vomiting   Latex Rash   Losartan Nausea Only and Other (See Comments)    Headache, fatigue   Paroxetine Other (See Comments)    Made nerves worse and caused fatigue   Cephalosporins Other (See Comments)    REACTION: she has tolerated rocephin and Keflex without difficulty   Clarithromycin Diarrhea   Clindamycin Diarrhea   Influenza Vaccines Other (See Comments)    Pt states that she got really sick from flu vaccine was sick a total of 6 weeks   Paxil [Paroxetine Hcl] Other (See Comments)    fatigue   Tape Rash    Review of  Systems: Constitutional:  no unexpected weight changes Eye:  no recent significant change in vision Ear/Nose/Mouth/Throat:  Ears:  no recent change in hearing Nose/Mouth/Throat:  no complaints of nasal congestion, no sore throat Cardiovascular: no chest pain Respiratory:  no shortness of breath Gastrointestinal:  no abdominal pain, no change in bowel habits GU:  Female: negative for dysuria or pelvic pain Musculoskeletal/Extremities:  no pain of the joints Integumentary (Skin/Breast):  no abnormal skin lesions reported Neurologic:  no headaches Endocrine:  denies fatigue  Exam BP 128/82   Pulse 73   Temp 98.6 F (37 C) (Oral)   Ht _0  (1.626 m)   Wt 256 lb (116.1 kg)   LMP  (LMP Unknown)   SpO2 98%   BMI 43.94 kg/m  General:  well developed, well nourished, in no apparent distress Skin:  no significant moles, warts, or growths Head:  no masses, lesions, or tenderness Eyes:  pupils equal and round, sclera anicteric without injection Ears:  canals without lesions, TMs shiny without retraction, no obvious effusion, no erythema Nose:  nares patent, septum midline, mucosa normal, and no drainage or sinus tenderness Throat/Pharynx:  lips and gingiva without lesion; tongue and uvula midline; non-inflamed pharynx; no exudates or postnasal drainage Neck: neck supple without adenopathy, thyromegaly, or masses Lungs:  clear to auscultation, breath sounds equal bilaterally, no respiratory distress Cardio:  regular rate and rhythm, no LE edema Abdomen:  abdomen soft, nontender; bowel sounds normal; no masses or organomegaly Genital: Defer to GYN Musculoskeletal:  symmetrical muscle groups noted without atrophy or deformity Extremities:  no clubbing, cyanosis, or edema, no deformities, no skin discoloration Neuro:  gait normal; deep tendon reflexes normal and symmetric Psych: well oriented with normal range of affect and appropriate judgment/insight  Assessment and Plan  Well adult  exam  Essential hypertension - Plan: Comprehensive metabolic panel, CBC  Low serum HDL - Plan: Lipid panel  Estrogen deficiency - Plan: DG Bone Density   Well 60 y.o. female. Counseled on diet and exercise. Other orders as above. Bivalent covid booster and Shingrix rec'd.  Flu shot politely declined.  Requested DEXA as ins will cover, I'm fine with that given her famhx.  Follow up in 6 mo or prn. The patient voiced understanding and agreement to the plan.  [place], DO 03/09/21 2:25 PM

## 2021-03-10 ENCOUNTER — Other Ambulatory Visit: Payer: Self-pay | Admitting: Family Medicine

## 2021-03-10 DIAGNOSIS — J4540 Moderate persistent asthma, uncomplicated: Secondary | ICD-10-CM

## 2021-03-10 LAB — COMPREHENSIVE METABOLIC PANEL
ALT: 14 U/L (ref 0–35)
AST: 23 U/L (ref 0–37)
Albumin: 3.6 g/dL (ref 3.5–5.2)
Alkaline Phosphatase: 72 U/L (ref 39–117)
BUN: 21 mg/dL (ref 6–23)
CO2: 27 mEq/L (ref 19–32)
Calcium: 9 mg/dL (ref 8.4–10.5)
Chloride: 101 mEq/L (ref 96–112)
Creatinine, Ser: 0.84 mg/dL (ref 0.40–1.20)
GFR: 75.41 mL/min (ref >60.00)
Glucose, Bld: 82 mg/dL (ref 70–99)
Potassium: 4.2 mEq/L (ref 3.5–5.1)
Sodium: 134 mEq/L — ABNORMAL LOW (ref 135–145)
Total Bilirubin: 0.5 mg/dL (ref 0.2–1.2)
Total Protein: 8.7 g/dL — ABNORMAL HIGH (ref 6.0–8.3)

## 2021-03-10 LAB — CBC
HCT: 34.8 % — ABNORMAL LOW (ref 36.0–46.0)
Hemoglobin: 11.2 g/dL — ABNORMAL LOW (ref 12.0–15.0)
MCHC: 32.3 g/dL (ref 30.0–36.0)
MCV: 85.9 fl (ref 78.0–100.0)
Platelets: 287 10*3/uL (ref 150.0–400.0)
RBC: 4.05 Mil/uL (ref 3.87–5.11)
RDW: 14.7 % (ref 11.5–15.5)
WBC: 3.6 10*3/uL — ABNORMAL LOW (ref 4.0–10.5)

## 2021-03-10 LAB — LIPID PANEL
Cholesterol: 160 mg/dL (ref 0–200)
HDL: 50.2 mg/dL (ref >39.00)
LDL Cholesterol: 92 mg/dL (ref 0–99)
NonHDL: 109.64
Total CHOL/HDL Ratio: 3
Triglycerides: 86 mg/dL (ref 0.0–149.0)
VLDL: 17.2 mg/dL (ref 0.0–40.0)

## 2021-03-10 MED ORDER — AMLODIPINE BESYLATE 2.5 MG PO TABS: 2.5000 mg | ORAL_TABLET | Freq: Two times a day (BID) | ORAL | 2 refills | Status: DC

## 2021-03-10 MED ORDER — TRIAMTERENE-HCTZ 75-50 MG PO TABS: 1.0000 | ORAL_TABLET | Freq: Every day | ORAL | 3 refills | Status: DC

## 2021-03-10 MED ORDER — ALBUTEROL SULFATE HFA 108 (90 BASE) MCG/ACT IN AERS
1.0000 | INHALATION_SPRAY | Freq: Four times a day (QID) | RESPIRATORY_TRACT | 2 refills | Status: DC | PRN
Start: 2021-03-10 — End: 2022-03-30

## 2021-03-10 MED ORDER — SERTRALINE HCL 50 MG PO TABS: 50.0000 mg | ORAL_TABLET | Freq: Every day | ORAL | 3 refills | Status: DC

## 2021-03-10 MED ORDER — BUDESONIDE-FORMOTEROL FUMARATE 160-4.5 MCG/ACT IN AERO: 2.0000 | INHALATION_SPRAY | Freq: Two times a day (BID) | RESPIRATORY_TRACT | 8 refills | Status: DC

## 2021-03-11 ENCOUNTER — Telehealth (HOSPITAL_BASED_OUTPATIENT_CLINIC_OR_DEPARTMENT_OTHER): Payer: Self-pay

## 2021-03-11 DIAGNOSIS — J45909 Unspecified asthma, uncomplicated: Secondary | ICD-10-CM | POA: Diagnosis not present

## 2021-03-11 DIAGNOSIS — I509 Heart failure, unspecified: Secondary | ICD-10-CM | POA: Diagnosis not present

## 2021-03-11 DIAGNOSIS — Z881 Allergy status to other antibiotic agents status: Secondary | ICD-10-CM | POA: Diagnosis not present

## 2021-03-11 DIAGNOSIS — Z880 Allergy status to penicillin: Secondary | ICD-10-CM | POA: Diagnosis not present

## 2021-03-11 DIAGNOSIS — I110 Hypertensive heart disease with heart failure: Secondary | ICD-10-CM | POA: Diagnosis not present

## 2021-03-11 DIAGNOSIS — Z886 Allergy status to analgesic agent status: Secondary | ICD-10-CM | POA: Diagnosis not present

## 2021-03-11 DIAGNOSIS — H25091 Other age-related incipient cataract, right eye: Secondary | ICD-10-CM | POA: Diagnosis not present

## 2021-03-11 DIAGNOSIS — I10 Essential (primary) hypertension: Secondary | ICD-10-CM | POA: Diagnosis not present

## 2021-03-11 DIAGNOSIS — K219 Gastro-esophageal reflux disease without esophagitis: Secondary | ICD-10-CM | POA: Diagnosis not present

## 2021-03-11 DIAGNOSIS — Z86711 Personal history of pulmonary embolism: Secondary | ICD-10-CM | POA: Diagnosis not present

## 2021-03-11 DIAGNOSIS — Z885 Allergy status to narcotic agent status: Secondary | ICD-10-CM | POA: Diagnosis not present

## 2021-03-11 DIAGNOSIS — H2511 Age-related nuclear cataract, right eye: Secondary | ICD-10-CM | POA: Diagnosis not present

## 2021-03-11 DIAGNOSIS — Z91040 Latex allergy status: Secondary | ICD-10-CM | POA: Diagnosis not present

## 2021-03-11 DIAGNOSIS — Z6841 Body Mass Index (BMI) 40.0 and over, adult: Secondary | ICD-10-CM | POA: Diagnosis not present

## 2021-03-11 DIAGNOSIS — K5090 Crohn's disease, unspecified, without complications: Secondary | ICD-10-CM | POA: Diagnosis not present

## 2021-03-11 DIAGNOSIS — I2510 Atherosclerotic heart disease of native coronary artery without angina pectoris: Secondary | ICD-10-CM | POA: Diagnosis not present

## 2021-03-11 DIAGNOSIS — Z79899 Other long term (current) drug therapy: Secondary | ICD-10-CM | POA: Diagnosis not present

## 2021-03-16 ENCOUNTER — Telehealth: Payer: Self-pay | Admitting: Family Medicine

## 2021-03-16 MED ORDER — PANTOPRAZOLE SODIUM 40 MG PO TBEC: 40.0000 mg | DELAYED_RELEASE_TABLET | Freq: Every day | ORAL | 2 refills | Status: DC

## 2021-03-16 MED ORDER — AMLODIPINE BESYLATE 2.5 MG PO TABS: 2.5000 mg | ORAL_TABLET | Freq: Two times a day (BID) | ORAL | 2 refills | Status: DC

## 2021-03-16 NOTE — Telephone Encounter (Signed)
Spoke to the patient and changed pharmacy.

## 2021-03-16 NOTE — Telephone Encounter (Signed)
Patient states she needs to speak to cma regarding her medication needing to be changed to another pharmacy, when asked for more details she stated it was a lot and just wanted to talk to [place]. She would like a call back. Please advice.

## 2021-03-27 ENCOUNTER — Encounter: Payer: Self-pay | Admitting: Family Medicine

## 2021-03-27 LAB — HM DIABETES EYE EXAM

## 2021-04-09 ENCOUNTER — Telehealth: Payer: Self-pay | Admitting: Family Medicine

## 2021-04-09 NOTE — Telephone Encounter (Signed)
Left message for patient to call back and schedule Medicare Annual Wellness Visit (AWV) in office.   If not able to come in office, please offer to do virtually or by telephone.  Left office number and my jabber [phone].  AWVI eligible as of 04/12/2009  Please schedule at anytime with Nurse Health Advisor.

## 2021-04-15 ENCOUNTER — Telehealth: Payer: Self-pay | Admitting: Family Medicine

## 2021-04-15 ENCOUNTER — Other Ambulatory Visit: Payer: Self-pay | Admitting: Family Medicine

## 2021-04-15 DIAGNOSIS — J4540 Moderate persistent asthma, uncomplicated: Secondary | ICD-10-CM

## 2021-04-15 DIAGNOSIS — Z1231 Encounter for screening mammogram for malignant neoplasm of breast: Secondary | ICD-10-CM

## 2021-04-15 MED ORDER — BUDESONIDE-FORMOTEROL FUMARATE 160-4.5 MCG/ACT IN AERO: 2.0000 | INHALATION_SPRAY | Freq: Two times a day (BID) | RESPIRATORY_TRACT | 0 refills | Status: DC

## 2021-04-15 MED ORDER — BUDESONIDE-FORMOTEROL FUMARATE 160-4.5 MCG/ACT IN AERO: 2.0000 | INHALATION_SPRAY | Freq: Two times a day (BID) | RESPIRATORY_TRACT | 3 refills | Status: DC

## 2021-04-15 NOTE — Addendum Note (Signed)
Addended by: Sharon Seller B on: 04/15/2021 02:43 PM   Modules accepted: Orders

## 2021-04-15 NOTE — Telephone Encounter (Signed)
Clarified refill questions for today. Please put in order for Mammo for 07/2021

## 2021-04-15 NOTE — Telephone Encounter (Signed)
done

## 2021-04-15 NOTE — Telephone Encounter (Signed)
Pt called and stated she has some questions regarding her medications and also she wants to follow up on referrals that was supposed to be sent in last appointment.

## 2021-04-21 ENCOUNTER — Other Ambulatory Visit (INDEPENDENT_AMBULATORY_CARE_PROVIDER_SITE_OTHER): Payer: Medicare Other

## 2021-04-21 ENCOUNTER — Other Ambulatory Visit: Payer: Self-pay | Admitting: Family Medicine

## 2021-04-21 DIAGNOSIS — R300 Dysuria: Secondary | ICD-10-CM

## 2021-04-21 NOTE — Progress Notes (Signed)
ua

## 2021-04-22 LAB — URINALYSIS
Bilirubin Urine: NEGATIVE
Hgb urine dipstick: NEGATIVE
Leukocytes,Ua: NEGATIVE
Nitrite: NEGATIVE
Specific Gravity, Urine: 1.015 (ref 1.000–1.030)
Total Protein, Urine: NEGATIVE
Urine Glucose: NEGATIVE
Urobilinogen, UA: 0.2 (ref 0.0–1.0)
pH: 7.5 (ref 5.0–8.0)

## 2021-04-22 LAB — URINE CULTURE
MICRO NUMBER:: 12850942
SPECIMEN QUALITY:: ADEQUATE

## 2021-04-25 ENCOUNTER — Telehealth (HOSPITAL_BASED_OUTPATIENT_CLINIC_OR_DEPARTMENT_OTHER): Payer: Self-pay

## 2021-05-15 ENCOUNTER — Other Ambulatory Visit: Payer: Self-pay | Admitting: Family Medicine

## 2021-05-15 DIAGNOSIS — J4540 Moderate persistent asthma, uncomplicated: Secondary | ICD-10-CM

## 2021-05-19 ENCOUNTER — Other Ambulatory Visit: Payer: Self-pay

## 2021-05-19 ENCOUNTER — Ambulatory Visit (HOSPITAL_BASED_OUTPATIENT_CLINIC_OR_DEPARTMENT_OTHER)
Admission: RE | Admit: 2021-05-19 | Discharge: 2021-05-19 | Disposition: A | Discharge status: Home / Self Care | Payer: Medicare Other | Source: Ambulatory Visit | Attending: Family Medicine | Admitting: Family Medicine

## 2021-05-19 DIAGNOSIS — E2839 Other primary ovarian failure: Secondary | ICD-10-CM | POA: Diagnosis not present

## 2021-05-19 DIAGNOSIS — Z780 Asymptomatic menopausal state: Secondary | ICD-10-CM | POA: Diagnosis not present

## 2021-06-09 ENCOUNTER — Ambulatory Visit: Payer: Medicare Other | Admitting: Family Medicine

## 2021-06-12 ENCOUNTER — Telehealth: Payer: Self-pay | Admitting: Family Medicine

## 2021-06-12 NOTE — Telephone Encounter (Signed)
Left message for patient to call back and schedule Medicare Annual Wellness Visit (AWV) in office.  ° °If not able to come in office, please offer to do virtually or by telephone.  Left office number and my jabber #[phone]. ° °Due for AWVI ° °Please schedule at anytime with Nurse Health Advisor. °  °

## 2021-06-19 ENCOUNTER — Ambulatory Visit: Payer: Medicare Other | Admitting: Family Medicine

## 2021-06-23 ENCOUNTER — Ambulatory Visit: Payer: Medicare Other | Admitting: Family Medicine

## 2021-06-26 ENCOUNTER — Ambulatory Visit: Payer: Medicare Other | Admitting: Pharmacist

## 2021-06-26 DIAGNOSIS — I10 Essential (primary) hypertension: Secondary | ICD-10-CM

## 2021-06-26 DIAGNOSIS — J4540 Moderate persistent asthma, uncomplicated: Secondary | ICD-10-CM

## 2021-06-29 NOTE — Patient Instructions (Signed)
[name] ?It was a pleasure speaking with you today.  ?I have attached a summary of our visit today and information about your health goals.  ? ?If you have any questions or concerns, please feel free to contact me either at the phone number below or with a MyChart message.  ? ?Keep up the good work! ? ?[name], PharmD ?Clinical Pharmacist ?[facility] ?[name] ?[phone] (direct line)  ?[phone] (main office number) ? ? ? ?Hypertension: ?Controlled; blood pressure goal < 130/80 ?BP Readings from Last 3 Encounters:  ?03/09/21 128/82  ?11/11/20 139/71  ?08/13/20 130/80  ? ?current treatment: ?Triamterene-hydrochlorothiazide 75-66m - take 1 tablet daily  ?Amlodipine 2.5279mdaily ?Interventions:  ?Discussed blood pressure goal ?Continue current therapy ? ?Asthma: ?Uncontrolled/controlled ?Current treatment: ?Symbicort 180/4.79m44m inhaler 2 puffs twice a day ?Albuterol inhaler - inhaler 1 or 2 puffs into lungs every 6 hours as needed for wheezing or shortness of breath ?Albuterol nebulizer - use 3 milliliters in nebulizer every 6 hours as needed for wheezing or shortness of breath ?Patient states cost of Symbicort just increased. Possibly in Medicare coverage gap. ?Interventions:  ?Educated on importance of taking maintenance inhaler, Symbicort daily; Reminded to rinse mouth after each use.  ?Assessed patient finances. Assessed for LIS / Extra Help but patient was about $500 over limit for even partial assistance. Provided patient with application for Symbicort patient assistance.  ? ?Medication management ?Pharmacist Clinical Goal(s): ?Over the next 90 days, patient will work with PharmD and providers to maintain optimal medication adherence ?Current pharmacy: [facility] ?Interventions ?Comprehensive medication review performed. ?Reviewed refill history and assessed adherence ?Continue current medication management strategy ? ?Patient Goals/Self-Care Activities ?Over the next 45  days, patient will:  ?take medications as prescribed,  ?focus on medication adherence by filling medications on time, and  ?collaborate with provider on medication access solutions ?Complete application for Symbicort patient assistance and return to TamCherre Robinslinical pharmacist at [facility].  ? ?Follow Up Plan: Telephone follow up appointment with care management team member scheduled for:  1 month   ? ?The patient verbalized understanding of instructions, educational materials, and care plan provided today and agreed to receive a mailed copy of patient instructions, educational materials, and care plan.   ?

## 2021-06-29 NOTE — Chronic Care Management (AMB) (Signed)
?Care Management  ? ?Pharmacy Note ? ?06/29/2021 ?Name: [name] MRN: [id number] DOB: 1961/01/20 ? ?Subjective: ?[name] is a 61 y.o. year old female who is a primary care patient of Shelda Pal, DO. The Care Management team was consulted for assistance with care management and care coordination needs.   ? ?Engaged with patient by telephone for initial visit in response to provider referral for pharmacy case management and/or care coordination services.  ? ?The patient was given information about Care Management services today including:  ?Care Management services includes personalized support from designated clinical staff supervised by the patient's primary care provider, including individualized plan of care and coordination with other care providers. ?24/7 contact phone numbers for assistance for urgent and routine care needs. ?The patient may stop case management services at any time by phone call to the office staff. ? ?Patient agreed to services and consent obtained. ? ?Assessment:  Review of patient status, including review of consultants reports, laboratory and other test data, was performed as part of comprehensive evaluation and provision of chronic care management services.  ? ?SDOH (Social Determinants of Health) assessments and interventions performed:  ?SDOH Interventions   ? ?Flowsheet Row Most Recent Value  ?SDOH Interventions   ?Financial Strain Interventions Other (Comment)  [Applying for PAP for Symbicort]  ? ?  ?  ? ?Objective: ? ?Lab Results  ?Component Value Date  ? CREATININE 0.84 03/09/2021  ? CREATININE 1.03 (H) 11/11/2020  ? CREATININE 0.82 08/13/2020  ? ? ?Lab Results  ?Component Value Date  ? HGBA1C 6.1 05/12/2009  ? ? ?   ?Component Value Date/Time  ? CHOL 160 03/09/2021 1406  ? TRIG 86.0 03/09/2021 1406  ? HDL 50.20 03/09/2021 1406  ? CHOLHDL 3 03/09/2021 1406  ? VLDL 17.2 03/09/2021 1406  ? [place] 92 03/09/2021 1406  ? [place] 84 01/15/2020 1358  ? ? ?Other:  (TSH, CBC, Vit D, etc.) ? ?Clinical ASCVD: No  ?The 10-year ASCVD risk score (Arnett DK, et al., 2019) is: 7.2% ?  Values used to calculate the score: ?    Age: 69 years ?    Sex: Female ?    Is Non-Hispanic African American: No ?    Diabetic: No ?    Tobacco smoker: No ?    Systolic Blood Pressure: 891 mmHg ?    Is BP treated: Yes ?    HDL Cholesterol: 50.2 mg/dL ?    Total Cholesterol: 160 mg/dL   ? ?Other: (CHADS2VASc if Afib, PHQ9 if depression, MMRC or CAT for COPD, ACT, DEXA) ? ?BP Readings from Last 3 Encounters:  ?03/09/21 128/82  ?11/11/20 139/71  ?08/13/20 130/80  ? ? ?Care Plan ? ?Allergies  ?Allergen Reactions  ? Butorphanol Tartrate Other (See Comments)  ?  Gave her the shakes for 6 weeks  ? Levofloxacin Palpitations  ?  REACTION: tachycardia  ? Moxifloxacin Palpitations  ?  REACTION: tachycardia but tolerated cirpofloxacin just fine  ? Stadol [Butorphanol] Other (See Comments)  ?  Caused shakes for 6 weeks ?"shaking for weeks"  ? Amoxicillin Rash  ?  REACTION: Rash with high doses. Can take lower doses like 553m  ? Asa [Aspirin] Other (See Comments)  ?  Pt stated the 81 mg caused Exacerbation of her asthma ?  ? Ciprofloxacin Hcl Nausea And Vomiting  ?  Can take lower doses like 5061m  ? Clindamycin/Lincomycin Nausea And Vomiting  ? Codeine Nausea And Vomiting  ? Effexor [Venlafaxine] Other (See Comments)  ?  hyperactivity ?Panic attacks  ? Erythromycin Diarrhea and Nausea And Vomiting  ? Latex Rash  ? Losartan Nausea Only and Other (See Comments)  ?  Headache, fatigue  ? Paroxetine Other (See Comments)  ?  Made nerves worse and caused fatigue  ? Cephalosporins Other (See Comments)  ?  REACTION: she has tolerated rocephin and Keflex without difficulty  ? Clarithromycin Diarrhea  ? Clindamycin Diarrhea  ? Influenza Vaccines Other (See Comments)  ?  Pt states that she got really sick from flu vaccine was sick a total of 6 weeks  ? Paxil [Paroxetine Hcl] Other (See Comments)  ?  fatigue  ? Tape Rash   ? ? ?Medications Reviewed Today   ? ? Reviewed by Cherre Robins, RPH-CPP (Pharmacist) on 06/29/21 at 0541  Med List Status: <None>  ? ?Medication Order Taking? Sig Documenting Provider Last Dose Status Informant  ?albuterol (PROVENTIL) (2.5 MG/3ML) 0.083% nebulizer solution [id number] Yes Take 3 mLs (2.5 mg total) by nebulization every 6 (six) hours as needed for wheezing or shortness of breath. Shelda Pal, DO Taking Active   ?albuterol (VENTOLIN HFA) 108 (90 Base) MCG/ACT inhaler [id number] Yes Inhale 1-2 puffs into the lungs every 6 (six) hours as needed for wheezing or shortness of breath. Shelda Pal, DO Taking Active   ?ALPRAZolam (XANAX) 1 MG tablet [id number] Yes Take 0.5-1 tablets (0.5-1 mg total) by mouth at bedtime as needed for anxiety. Shelda Pal, DO Taking Active   ?amLODipine (NORVASC) 2.5 MG tablet [id number] Yes Take 1 tablet (2.5 mg total) by mouth in the morning and at bedtime. Shelda Pal, DO Taking Active   ?diclofenac Sodium (VOLTAREN) 1 % GEL [id number] Yes Apply 2 g topically 4 (four) times daily. Shelda Pal, DO Taking Active   ?ELDERBERRY PO [id number] Yes Take 1,250 mg by mouth daily. [provider] Taking Active   ?Lactobacillus (ACIDOPHILUS PO) [id number] Yes Take 2 capsules by mouth daily.  [provider] Taking Active Self  ?methylPREDNISolone (MEDROL) 4 MG tablet [id number] No Take 5 tablets (20 mg total) by mouth daily. Take for 5 days as needed for asthma exacerbation  ?Patient not taking: Reported on 06/29/2021  ? Shelda Pal, DO Not Taking Active   ?Nebulizers MISC [id number] Yes 1 Units by Misc.(Non-Drug; Combo Route) route every 8 (eight) hours as needed. [provider] Taking Active   ?pantoprazole (PROTONIX) 40 MG tablet [id number] Yes Take 1 tablet (40 mg total) by mouth daily. Shelda Pal, DO Taking Active   ?sertraline (ZOLOFT) 50 MG tablet [id number] Yes Take 1 tablet (50  mg total) by mouth daily. Shelda Pal, DO Taking Active   ?SYMBICORT 160-4.5 MCG/ACT inhaler [id number] Yes INHALE 2 PUFFS INTO THE LUNGS TWICE DAILY [place]ling, Crosby Oyster, DO Taking Active   ?triamterene-hydrochlorothiazide (MAXZIDE) 75-50 MG tablet [id number] Yes Take 1 tablet by mouth daily. Reported on 06/12/2015 Shelda Pal, DO Taking Active   ?VITAMIN E PO [id number] Yes Take by mouth daily. [provider] Taking Active   ? ?  ?  ? ?  ? ? ?Patient Active Problem List  ? Diagnosis Date Noted  ? Moderate persistent asthma without complication 03/31/7587  ? Chronic diastolic (congestive) heart failure ([place]) 08/13/2020  ? Thrombocytopenia, unspecified ([place]) 08/13/2020  ? History of DVT (deep vein thrombosis) 06/12/2019  ? Cough 11/30/2018  ? GAD (generalized anxiety disorder) 11/10/2018  ? Urinary, incontinence, stress female 10/27/2016  ? Genetic testing 02/17/2016  ? Morbid obesity with  BMI of 45.0-49.9, adult ([place]) 10/21/2015  ? Port catheter in place 07/30/2015  ? Abscess of groin, right 06/19/2015  ? Noncompliance with therapeutic plan 06/15/2015  ? Chemotherapy induced neutropenia ([place]) 05/17/2015  ? Portacath in place 05/11/2015  ? Chemotherapy induced thrombocytopenia 05/11/2015  ? Hypokalemia 04/25/2015  ? Antineoplastic chemotherapy induced pancytopenia ([place]) 04/25/2015  ? Fever 04/25/2015  ? Rash 04/25/2015  ? Diarrhea 04/25/2015  ? Anemia due to antineoplastic chemotherapy 04/16/2015  ? Folliculitis 58/48/3507  ? Leukopenia due to antineoplastic chemotherapy ([place]) 04/16/2015  ? Morbid obesity with BMI of 50.0-59.9, adult ([place]) 03/23/2015  ? History of melanoma excision 03/23/2015  ? Asthma exacerbation attacks 03/23/2015  ? Crohn's disease without complication ([place]) 57/32/2567  ? Umbilical hernia without obstruction and without gangrene 03/23/2015  ? Essential hypertension 03/23/2015  ? Ovarian CA, left ([place]) 02/11/2015  ? 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  ? CANDIDIASIS OF  VULVA AND VAGINA 04/10/2009  ? CONTACT DERMATITIS&OTHER ECZEMA DUE UNSPEC CAUSE 04/10/2009  ? CELLULITIS AND ABSCESS OF LEG EXCEPT FOOT 03/24/2009  ? EDEMA 03/24/2009  ? OPEN WOUND OF KNEE LEG AND ANKL

## 2021-07-01 ENCOUNTER — Ambulatory Visit (INDEPENDENT_AMBULATORY_CARE_PROVIDER_SITE_OTHER): Payer: Medicare Other | Admitting: Family Medicine

## 2021-07-01 ENCOUNTER — Encounter: Payer: Self-pay | Admitting: Family Medicine

## 2021-07-01 VITALS — BP 124/80 | HR 72 | Temp 99.1°F | Resp 16 | Ht 64.0 in | Wt 261.8 lb

## 2021-07-01 DIAGNOSIS — I5032 Chronic diastolic (congestive) heart failure: Secondary | ICD-10-CM

## 2021-07-01 DIAGNOSIS — Z6841 Body Mass Index (BMI) 40.0 and over, adult: Secondary | ICD-10-CM

## 2021-07-01 DIAGNOSIS — T50905A Adverse effect of unspecified drugs, medicaments and biological substances, initial encounter: Secondary | ICD-10-CM | POA: Diagnosis not present

## 2021-07-01 DIAGNOSIS — H1032 Unspecified acute conjunctivitis, left eye: Secondary | ICD-10-CM | POA: Diagnosis not present

## 2021-07-01 MED ORDER — SEMAGLUTIDE-WEIGHT MANAGEMENT 1.7 MG/0.75ML ~~LOC~~ SOAJ: 1.7000 mg | SUBCUTANEOUS | 0 refills | Status: DC

## 2021-07-01 MED ORDER — POLYMYXIN B-TRIMETHOPRIM 10000-0.1 UNIT/ML-% OP SOLN: 2.0000 [drp] | Freq: Four times a day (QID) | OPHTHALMIC | 0 refills | Status: DC

## 2021-07-01 MED ORDER — SEMAGLUTIDE-WEIGHT MANAGEMENT 0.5 MG/0.5ML ~~LOC~~ SOAJ: 0.5000 mg | SUBCUTANEOUS | 0 refills | Status: DC

## 2021-07-01 MED ORDER — SEMAGLUTIDE-WEIGHT MANAGEMENT 1 MG/0.5ML ~~LOC~~ SOAJ: 1.0000 mg | SUBCUTANEOUS | 0 refills | Status: DC

## 2021-07-01 MED ORDER — TORSEMIDE 20 MG PO TABS: 20.0000 mg | ORAL_TABLET | Freq: Every day | ORAL | 2 refills | Status: DC

## 2021-07-01 MED ORDER — SEMAGLUTIDE-WEIGHT MANAGEMENT 0.25 MG/0.5ML ~~LOC~~ SOAJ: 0.2500 mg | SUBCUTANEOUS | 0 refills | Status: DC

## 2021-07-01 MED ORDER — SEMAGLUTIDE-WEIGHT MANAGEMENT 2.4 MG/0.75ML ~~LOC~~ SOAJ: 2.4000 mg | SUBCUTANEOUS | 0 refills | Status: DC

## 2021-07-01 NOTE — Patient Instructions (Addendum)
Artificial tears like Refresh and Systane may be used for comfort. OK to get generic version. Generally people use them every 2-4 hours, but you can use them as much as you want because there is no medication in it. ? ?Use warm compresses.  ? ?Avoid touching your eye.  ? ?For the swelling in your lower extremities, be sure to elevate your legs when able, mind the salt intake, stay physically active and consider wearing compression stockings. ? ?Don't fill the medicine if it is too expensive.  ? ?Keep the diet clean and stay active. ? ?Let us know if you need anything. ?

## 2021-07-01 NOTE — Progress Notes (Signed)
Chief Complaint  ?Patient presents with  ? Leg Swelling  ?  Here for Swelling leg and feet  ? ? ?[name] here for bilateral leg swelling. ? ?Duration: 1  week ?Hx of prolonged bedrest, recent surgery, travel or injury? No ?Pain the calf? No ?SOB? No ?Personal or family history of clot or bleeding disorder? Yes ?Hx of heart failure, renal failure, hepatic failure? No hx of CKD or hep failure ?Medication change? No  ? ?Itchy eye for a few days. Tried OTC artificial tears without relief. Drainage from eye. Nothing got into eye.  ? ?Trying to lose weight. Diet could be better has not been exercise much lately. Eats late at night. Interested in trying [place].  ? ?Past Medical History:  ?Diagnosis Date  ? Anemia due to antineoplastic chemotherapy 04/16/2015  ? Anxiety state 06/01/2007  ? Qualifier: Diagnosis of  By: Ronnald Ramp RN, [place], [place]    ? Asthma   ? Asymptomatic proteinuria   ? Intermittent  ? CHF (congestive heart failure) ([place])   ? COPD (chronic obstructive pulmonary disease) ([place])   ? Crohn's disease without complication ([place]) 16/83/7290  ? Depression   ? Genetic testing 02/17/2016  ? 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  The Custom gene panel offered by [facility] includes sequencing and rearrangement analysis for the following 24 genes:  ATM, BAP1, BARD1, BRCA1, BRCA2, BRIP1, CDH1, CDK4, CDKN2A, CHEK2, EPCAM, FANCC, MITF, MLH1, MSH2, MSH6, NBN, PALB2, PMS2, PTEN, RAD51C, RAD51D, TP53, and XRCC2.   The report date is February 13, 2016.  ? GERD (gastroesophageal reflux disease)   ? Heart murmur   ? History of melanoma excision 03/23/2015  ? Hypertension   ? Leukopenia due to antineoplastic chemotherapy ([place]) 04/16/2015  ? Melanoma ([place]) dx'd 2003  ? right leg  ? Morbid obesity with BMI of 50.0-59.9, adult ([place]) 03/23/2015  ? OPEN WOUND OF KNEE LEG AND ANKLE COMPLICATED 21/02/5519  ? Qualifier: Diagnosis of  By: Gustavo Lah    ? ovarian ca dx'd 10/216  ? Seasonal allergies   ? Umbilical  hernia without obstruction and without gangrene 03/23/2015  ? Urinary, incontinence, stress female 10/27/2016  ? ?BP 124/80 (BP Location: Right Arm, Patient Position: Sitting, Cuff Size: Normal)   Pulse 72   Temp 99.1 ?F (37.3 ?C) (Oral)   Resp 16   Ht 5' 4"  (1.626 m)   Wt 261 lb 12.8 oz (118.8 kg)   LMP  (LMP Unknown)   SpO2 94%   BMI 44.94 kg/m?  ?Gen- awake, alert, appears stated age ?Heart- RRR, no murmurs, 2+  pitting LE edema b/l tapering at prox 1/3 of tibia ?Lungs- CTAB, normal effort w/o accessory muscle use ?MSK- no calf pain to palpation b/l ?Psych: Age appropriate judgment and insight ? ?Chronic diastolic (congestive) heart failure (HCC) - Plan: torsemide (DEMADEX) 20 MG tablet, Basic metabolic panel ? ?Adverse effect of drug or medicament, initial encounter ? ?Acute conjunctivitis of left eye, unspecified acute conjunctivitis type - Plan: trimethoprim-polymyxin b (POLYTRIM) ophthalmic solution ? ?Morbid obesity with BMI of 50.0-59.9, adult ([place]) - Plan: Semaglutide-Weight Management 0.25 MG/0.5ML SOAJ, Semaglutide-Weight Management 0.5 MG/0.5ML SOAJ, Semaglutide-Weight Management 1 MG/0.5ML SOAJ, Semaglutide-Weight Management 1.7 MG/0.75ML SOAJ, Semaglutide-Weight Management 2.4 MG/0.75ML SOAJ ? ?Adverse effect of med. Stop Lasix, change to Demadex. Will ck bmp in 1 week. ?As above. ?Polytrim drops, artitificial tears, warm compresses.  ?Wegovy, don't fill if too expensive. Counseled on diet/exercise. F/u in 1 mo to reck 1 and  4.  ?Pt voiced understanding and agreement to the plan. ? ?Shelda Pal, DO ?07/01/21  ?1:46 PM ? ? ?

## 2021-07-02 ENCOUNTER — Telehealth: Payer: Self-pay | Admitting: Family Medicine

## 2021-07-02 NOTE — Telephone Encounter (Signed)
Pt stated Tammy had asked some financial questions regarding medications. She said it was okay to call back at earliest convenience.   ?

## 2021-07-02 NOTE — Telephone Encounter (Signed)
Patient was called back. She provided household income needed for Symbicort patient assistance program application. ?

## 2021-07-07 ENCOUNTER — Other Ambulatory Visit: Payer: Medicare Other

## 2021-07-07 ENCOUNTER — Encounter: Payer: Self-pay | Admitting: Family Medicine

## 2021-07-07 ENCOUNTER — Ambulatory Visit (INDEPENDENT_AMBULATORY_CARE_PROVIDER_SITE_OTHER): Payer: Medicare Other | Admitting: Family Medicine

## 2021-07-07 VITALS — BP 120/84 | HR 88 | Temp 98.3°F | Ht 64.0 in | Wt 264.4 lb

## 2021-07-07 DIAGNOSIS — I5032 Chronic diastolic (congestive) heart failure: Secondary | ICD-10-CM | POA: Diagnosis not present

## 2021-07-07 DIAGNOSIS — Z6841 Body Mass Index (BMI) 40.0 and over, adult: Secondary | ICD-10-CM

## 2021-07-07 MED ORDER — CIPROFLOXACIN HCL 0.3 % OP SOLN: 1.0000 [drp] | OPHTHALMIC | 0 refills | Status: DC

## 2021-07-07 MED ORDER — OZEMPIC (0.25 OR 0.5 MG/DOSE) 2 MG/1.5ML ~~LOC~~ SOPN: PEN_INJECTOR | SUBCUTANEOUS | 1 refills | Status: DC

## 2021-07-07 NOTE — Patient Instructions (Signed)
Let me know if there are cost issues. ? ?Keep the diet clean and stay active. ? ?Let us know if you need anything. ?

## 2021-07-07 NOTE — Progress Notes (Signed)
Chief Complaint  ?Patient presents with  ? Follow-up  ?  1 week  ? ? ?Subjective: ?Patient is a 61 y.o. female here for f.u. Here w spouse.  ? ?Has not started torsemide. No changes in swelling. Has tried to stay off of feet. Diet is fair, still walking. No CP or SOB.  ? ?Morbid obesity: Ins would not cover Wegovy, they stated they would cover Ozempic. She does not have DM but would like it called in. Diet is improving. She tries walking and lifting weights routinely.  ? ?Irritated eye: Drops sent in, pharmacy does not have. Wondering if she could have Cipro drops instead as she has done well with this in the past.  ? ?Past Medical History:  ?Diagnosis Date  ? Anemia due to antineoplastic chemotherapy 04/16/2015  ? Anxiety state 06/01/2007  ? Qualifier: Diagnosis of  By: Ronnald Ramp RN, [place], [place]    ? Asthma   ? Asymptomatic proteinuria   ? Intermittent  ? CHF (congestive heart failure) ([place])   ? COPD (chronic obstructive pulmonary disease) ([place])   ? Crohn's disease without complication ([place]) 96/78/9381  ? Depression   ? Genetic testing 02/17/2016  ? 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  The Custom gene panel offered by [facility] includes sequencing and rearrangement analysis for the following 24 genes:  ATM, BAP1, BARD1, BRCA1, BRCA2, BRIP1, CDH1, CDK4, CDKN2A, CHEK2, EPCAM, FANCC, MITF, MLH1, MSH2, MSH6, NBN, PALB2, PMS2, PTEN, RAD51C, RAD51D, TP53, and XRCC2.   The report date is February 13, 2016.  ? GERD (gastroesophageal reflux disease)   ? Heart murmur   ? History of melanoma excision 03/23/2015  ? Hypertension   ? Leukopenia due to antineoplastic chemotherapy ([place]) 04/16/2015  ? Melanoma ([place]) dx'd 2003  ? right leg  ? Morbid obesity with BMI of 50.0-59.9, adult ([place]) 03/23/2015  ? OPEN WOUND OF KNEE LEG AND ANKLE COMPLICATED 01/75/1025  ? Qualifier: Diagnosis of  By: Gustavo Lah    ? ovarian ca dx'd 10/216  ? Seasonal allergies   ? Umbilical hernia without obstruction and without gangrene  03/23/2015  ? Urinary, incontinence, stress female 10/27/2016  ? ? ?Objective: ?BP 120/84   Pulse 88   Temp 98.3 ?F (36.8 ?C) (Oral)   Ht _0  (1.626 m)   Wt 264 lb 6 oz (119.9 kg)   LMP  (LMP Unknown)   SpO2 98%   BMI 45.38 kg/m?  ?General: Awake, appears stated age ?Heart: RRR, 2+ pitting b/l LE edema tapering at prox 1/3 of tibia b/l ?Lungs: CTAB, no rales, wheezes or rhonchi. No accessory muscle use ?Psych: Age appropriate judgment and insight, normal affect and mood ? ?Assessment and Plan: ?Morbid obesity with BMI of 45.0-49.9, adult ([place]) ? ?Chronic diastolic (congestive) heart failure (HCC) ? ?Chronic, not controlled. Stop Wegovy, sent in [place]. Counseled on diet/exercise.  ?Chronic, not controlled. Will ck BMP next week, start torsemide as scheduled. Compression stockings, elevation, activity encouraged. Mind salt intake.  ?F/u as originally scheduled.  ?The patient voiced understanding and agreement to the plan. ? ?Shelda Pal, DO ?07/07/21  ?3:52 PM ? ? ? ? ?

## 2021-07-08 ENCOUNTER — Telehealth: Payer: Self-pay | Admitting: Family Medicine

## 2021-07-08 NOTE — Telephone Encounter (Signed)
Fax from pharmacy ?[name] is not covered by insurance ?

## 2021-07-08 NOTE — Telephone Encounter (Signed)
Plz have pt contact pharmacy to see what they will cover for weight loss for non-diabetics. Ty.  ?

## 2021-07-08 NOTE — Telephone Encounter (Signed)
Called left message to call back 

## 2021-07-09 NOTE — Telephone Encounter (Signed)
Called left message to call back 

## 2021-07-09 NOTE — Telephone Encounter (Signed)
Spoke to the patient and informed her of instructions. ?She agreed to do so. ?

## 2021-07-14 ENCOUNTER — Other Ambulatory Visit: Payer: Medicare Other

## 2021-07-20 DIAGNOSIS — H524 Presbyopia: Secondary | ICD-10-CM | POA: Diagnosis not present

## 2021-07-21 ENCOUNTER — Other Ambulatory Visit: Payer: Medicare Other

## 2021-07-24 ENCOUNTER — Telehealth: Payer: Self-pay | Admitting: *Deleted

## 2021-07-24 NOTE — Telephone Encounter (Signed)
Attempted to return the patient's call and left a message to call the office back  ?

## 2021-07-28 ENCOUNTER — Encounter (HOSPITAL_BASED_OUTPATIENT_CLINIC_OR_DEPARTMENT_OTHER): Payer: Self-pay

## 2021-07-28 ENCOUNTER — Ambulatory Visit (HOSPITAL_BASED_OUTPATIENT_CLINIC_OR_DEPARTMENT_OTHER)
Admission: RE | Admit: 2021-07-28 | Discharge: 2021-07-28 | Disposition: A | Discharge status: Home / Self Care | Payer: Medicare Other | Source: Ambulatory Visit | Attending: Family Medicine | Admitting: Family Medicine

## 2021-07-28 DIAGNOSIS — Z1231 Encounter for screening mammogram for malignant neoplasm of breast: Secondary | ICD-10-CM | POA: Insufficient documentation

## 2021-07-29 ENCOUNTER — Encounter: Payer: Self-pay | Admitting: Oncology

## 2021-07-29 NOTE — Telephone Encounter (Signed)
Patient returned call and scheduled an appt for 5/10 with Dr Delsa Sale  ?

## 2021-08-14 ENCOUNTER — Encounter: Payer: Self-pay | Admitting: Obstetrics & Gynecology

## 2021-08-19 ENCOUNTER — Other Ambulatory Visit: Payer: Self-pay

## 2021-08-19 ENCOUNTER — Inpatient Hospital Stay: Payer: Medicare Other | Attending: Obstetrics & Gynecology | Admitting: Obstetrics & Gynecology

## 2021-08-19 ENCOUNTER — Encounter: Payer: Self-pay | Admitting: Obstetrics & Gynecology

## 2021-08-19 VITALS — BP 151/81 | HR 73 | Temp 98.5°F | Resp 18 | Ht 64.0 in | Wt 253.0 lb

## 2021-08-19 DIAGNOSIS — Z01419 Encounter for gynecological examination (general) (routine) without abnormal findings: Secondary | ICD-10-CM | POA: Insufficient documentation

## 2021-08-19 DIAGNOSIS — Z8543 Personal history of malignant neoplasm of ovary: Secondary | ICD-10-CM | POA: Insufficient documentation

## 2021-08-19 DIAGNOSIS — C562 Malignant neoplasm of left ovary: Secondary | ICD-10-CM

## 2021-08-19 NOTE — Assessment & Plan Note (Deleted)
[name]  is a 61 y.o.  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 ?Adjuvant chemotherapy from 03/26/15-05/29/15. ?? ?Complete clinical response of post-treatment imaging from 07/04/15. ?? ?She completed less than 3 full cycles of adjuvant chemotherapy with carboplatin and paclitaxel, due to extreme bone marrow toxicity.  ?? ?> can be discharged from routine scheduled cancer surveillance. ?>recommend following up with a general gynecologist annually for gynecologic care  ?

## 2021-08-19 NOTE — Progress Notes (Signed)
Follow Up Note: Gyn-Onc ? ?[name] 61 y.o. female ? ?CC: She presents for an annual exam ? ? ?HPI: The oncology history was reviewed. ? ?Interval History: She denies abdominal distention, pain, weight loss or change in her bowel habits.   Wears incontinence undergarments at night.  Not sexually active--partner w/ED.  No domestic violence.  MMG UTD.   Has PCP. ? ?Review of Systems  ?Review of Systems  ?Constitutional:  Negative for malaise/fatigue and weight loss.  ?Respiratory:  Negative for shortness of breath and wheezing.   ?Cardiovascular:  Negative for chest pain and leg swelling.  ?Gastrointestinal:  Negative for abdominal pain, blood in stool, constipation, nausea and vomiting.  ?Genitourinary:  Negative for dysuria, frequency, hematuria and urgency.  ?Musculoskeletal:  Negative for joint pain and myalgias.  ?Neurological:  Negative for weakness.  ?Psychiatric/Behavioral:  Negative for depression. The patient does not have insomnia.   ? ?Current medications, allergy, social history, past surgical history, past medical history, family history were all reviewed. ? ? ? ?Vitals:  There were no vitals taken for this visit. ? ?Physical Exam:  ?Physical Exam ?Exam conducted with a chaperone present.  ?Constitutional:   ?   General: She is not in acute distress. ?Breast: ?No masses, NT, no discharge ?Cardiovascular:  ?   Rate and Rhythm: Normal rate and regular rhythm.  ?Pulmonary:  ?   Effort: Pulmonary effort is normal.  ?   Breath sounds: Normal breath sounds. No wheezing or rhonchi.  ?Abdominal:  ?   Palpations: Abdomen is soft.  ?   Tenderness: There is no abdominal tenderness. There is no right CVA tenderness or left CVA tenderness.  ?   Hernia: No hernia is present.  ?Genitourinary: ?   General: Normal vulva.  ?   Urethra: No urethral lesion.  ?   Vagina: No lesions. No bleeding ?Musculoskeletal:  ?   Cervical back: Neck supple.  ?   Right lower leg: No edema.  ?   Left lower leg: No edema.   ?Lymphadenopathy:  ?   Upper Body:  ?   Right upper body: No supraclavicular adenopathy.  ?   Left upper body: No supraclavicular adenopathy.  ?   Lower Body: No right inguinal adenopathy. No left inguinal adenopathy.  ?Skin: ?   Findings: No rash.  ?Neurological:  ?   Mental Status: She is oriented to person, place, and time.  ? ?Assessment/Plan:  ?Problem List Items Addressed This Visit   ? ?  ? Oncology  ? Ovarian CA, left ([place]) - Primary  ? ?Other Visit Diagnoses   ? ? Encounter for gynecological examination without abnormal finding      ? ?  ?  ?Unremarkable exam ? ?>healthy lifestyle practices reviewed ?>education materials provided re: urinary incontinence, Kegel exercises ?>return prn or in 1 yr ? ? ?Lahoma Crocker, MD  ?

## 2021-08-19 NOTE — Patient Instructions (Signed)
Return in 1 year ?

## 2021-08-26 ENCOUNTER — Ambulatory Visit: Payer: Medicare Other | Admitting: Pharmacist

## 2021-08-26 DIAGNOSIS — J4540 Moderate persistent asthma, uncomplicated: Secondary | ICD-10-CM

## 2021-08-26 NOTE — Chronic Care Management (AMB) (Signed)
?Care Management  ? ?Pharmacy Note ? ?08/26/2021 ?Name: [name] MRN: [id number] DOB: [birth year] ? ?Summary:  ?Coordinated with patient assistance program to confirmed that patient was approved to receive Symbicort inhaler from [place] and me patient assistance program thru 04/11/2022. Tried to contact patient to see if she had received first shipment and if she had any questions about how to reorder. Unable to reach patient. LM on MV if she has questions to contact me at either [phone] or [phone].  ?Plan to start reapplication process at end of 2023.  ? ?Subjective: ?[name] is a 61 y.o. year old female who is a primary care patient of Shelda Pal, DO. The Care Management team was consulted for assistance with care management and care coordination needs.   ? ?Collaboration with patient assistance program - [place] and [place]  for  verification of patient assistance program approval  in response to provider referral for pharmacy case management and/or care coordination services.  ? ?The patient was given information about Care Management services today including:  ?Care Management services includes personalized support from designated clinical staff supervised by the patient's primary care provider, including individualized plan of care and coordination with other care providers. ?24/7 contact phone numbers for assistance for urgent and routine care needs. ?The patient may stop case management services at any time by phone call to the office staff. ? ?Patient agreed to services and consent obtained. ? ?Assessment:  Review of patient status, including review of consultants reports, laboratory and other test data, was performed as part of comprehensive evaluation and provision of chronic care management services.  ? ?SDOH (Social Determinants of Health) assessments and interventions performed:  ? ?  ? ?Objective: ? ?Lab Results  ?Component Value Date  ? CREATININE 0.84 03/09/2021  ? CREATININE  1.03 (H) 11/11/2020  ? CREATININE 0.82 08/13/2020  ? ? ?Lab Results  ?Component Value Date  ? HGBA1C 6.1 05/12/2009  ? ? ?   ?Component Value Date/Time  ? CHOL 160 03/09/2021 1406  ? TRIG 86.0 03/09/2021 1406  ? HDL 50.20 03/09/2021 1406  ? CHOLHDL 3 03/09/2021 1406  ? VLDL 17.2 03/09/2021 1406  ? [place] 92 03/09/2021 1406  ? [place] 84 01/15/2020 1358  ? ? ?Other: (TSH, CBC, Vit D, etc.) ? ?Clinical ASCVD: No  ?The 10-year ASCVD risk score (Arnett DK, et al., 2019) is: 6.1% ?  Values used to calculate the score: ?    Age: 61 years ?    Sex: Female ?    Is Non-Hispanic African American: No ?    Diabetic: No ?    Tobacco smoker: No ?    Systolic Blood Pressure: 124 mmHg ?    Is BP treated: Yes ?    HDL Cholesterol: 50.2 mg/dL ?    Total Cholesterol: 160 mg/dL   ? ?Other: (CHADS2VASc if Afib, PHQ9 if depression, MMRC or CAT for COPD, ACT, DEXA) ? ?BP Readings from Last 3 Encounters:  ?08/19/21 (!) 151/81  ?07/07/21 120/84  ?07/01/21 124/80  ? ? ?Care Plan ? ?Allergies  ?Allergen Reactions  ? Butorphanol Tartrate Other (See Comments)  ?  Gave her the shakes for 6 weeks  ? Levofloxacin Palpitations  ?  REACTION: tachycardia  ? Moxifloxacin Palpitations  ?  REACTION: tachycardia but tolerated cirpofloxacin just fine  ? Stadol [Butorphanol] Other (See Comments)  ?  Caused shakes for 6 weeks ?"shaking for weeks"  ? Amoxicillin Rash  ?  REACTION: Rash with high doses.  Can take lower doses like 545m  ? Asa [Aspirin] Other (See Comments)  ?  Pt stated the 81 mg caused Exacerbation of her asthma ?  ? Ciprofloxacin Hcl Nausea And Vomiting  ?  Can take lower doses like 5070m  ? Clindamycin/Lincomycin Nausea And Vomiting  ? Codeine Nausea And Vomiting  ? Effexor [Venlafaxine] Other (See Comments)  ?  hyperactivity ?Panic attacks  ? Erythromycin Diarrhea and Nausea And Vomiting  ? Latex Rash  ? Losartan Nausea Only and Other (See Comments)  ?  Headache, fatigue  ? Paroxetine Other (See Comments)  ?  Made nerves worse and caused  fatigue  ? Cephalosporins Other (See Comments)  ?  REACTION: she has tolerated rocephin and Keflex without difficulty  ? Clarithromycin Diarrhea  ? Clindamycin Diarrhea  ? Influenza Vaccines Other (See Comments)  ?  Pt states that she got really sick from flu vaccine was sick a total of 6 weeks  ? Paxil [Paroxetine Hcl] Other (See Comments)  ?  fatigue  ? Tape Rash  ? ? ?Medications Reviewed Today   ? ? Reviewed by EcCherre RobinsRPH-CPP (Pharmacist) on 08/26/21 at 1521  Med List Status: <None>  ? ?Medication Order Taking? Sig Documenting Provider Last Dose Status Informant  ?albuterol (PROVENTIL) (2.5 MG/3ML) 0.083% nebulizer solution [id number] Take 3 mLs (2.5 mg total) by nebulization every 6 (six) hours as needed for wheezing or shortness of breath. WeShelda PalDO Taking Active   ?albuterol (VENTOLIN HFA) 108 (90 Base) MCG/ACT inhaler [id number] Inhale 1-2 puffs into the lungs every 6 (six) hours as needed for wheezing or shortness of breath. WeShelda PalDO Taking Active   ?ALPRAZolam (XANAX) 1 MG tablet [id number] Take 0.5-1 tablets (0.5-1 mg total) by mouth at bedtime as needed for anxiety. WeShelda PalDO Taking Active   ?amLODipine (NORVASC) 2.5 MG tablet [id number] Take 1 tablet (2.5 mg total) by mouth in the morning and at bedtime. WeShelda PalDO Taking Active   ?diclofenac Sodium (VOLTAREN) 1 % GEL [id number] Apply 2 g topically 4 (four) times daily. WeShelda PalDO Taking Active   ?ELDERBERRY PO [id number] Take 1,250 mg by mouth daily. [provider] Taking Active   ?Lactobacillus (ACIDOPHILUS PO) [id number] Take 2 capsules by mouth daily.  [provider] Taking Active Self  ?Nebulizers MISC [id number] 1 Units by Misc.(Non-Drug; Combo Route) route every 8 (eight) hours as needed. [provider] Taking Active   ?pantoprazole (PROTONIX) 40 MG tablet [id number] Take 1 tablet (40 mg total) by mouth  daily. WeShelda PalDO Taking Active   ?Discontinued 0575/10/2558527Duplicate)   ?sertraline (ZOLOFT) 50 MG tablet [id number] Take 1 tablet (50 mg total) by mouth daily. WeShelda PalDO Taking Active   ?SYMBICORT 160-4.5 MCG/ACT inhaler [id number] INHALE 2 PUFFS INTO THE LUNGS TWICE DAILY [place], [place] OysterDO Taking Active   ?triamterene-hydrochlorothiazide (MAXZIDE) 75-50 MG tablet [id number] Take 1 tablet by mouth daily. Reported on 06/12/2015 WeShelda PalDO Taking Active   ?VITAMIN E PO [id number] Take by mouth daily. [provider] Taking Active   ? ?  ?  ? ?  ? ? ?Patient Active Problem List  ? Diagnosis Date Noted  ? Moderate persistent asthma without complication 0509/98/3382? Chronic diastolic (congestive) heart failure ([facility]05/07/2020  ? Thrombocytopenia, unspecified ([place]/07/2020  ? History of DVT (deep vein thrombosis) 06/12/2019  ?  Cough 11/30/2018  ? GAD (generalized anxiety disorder) 11/10/2018  ? Urinary, incontinence, stress female 10/27/2016  ? Genetic testing 02/17/2016  ? Morbid obesity with BMI of 45.0-49.9, adult ([place]) 10/21/2015  ? Port catheter in place 07/30/2015  ? Abscess of groin, right 06/19/2015  ? Noncompliance with therapeutic plan 06/15/2015  ? Chemotherapy induced neutropenia ([facility]) 05/17/2015  ? Portacath in place 05/11/2015  ? Chemotherapy induced thrombocytopenia 05/11/2015  ? Hypokalemia 04/25/2015  ? Antineoplastic chemotherapy induced pancytopenia ([place]) 04/25/2015  ? Fever 04/25/2015  ? Rash 04/25/2015  ? Diarrhea 04/25/2015  ? Anemia due to antineoplastic chemotherapy 04/16/2015  ? Folliculitis 16/01/9603  ? Leukopenia due to antineoplastic chemotherapy ([place]) 04/16/2015  ? Morbid obesity with BMI of 50.0-59.9, adult ([place]) 03/23/2015  ? History of melanoma excision 03/23/2015  ? Asthma exacerbation attacks 03/23/2015  ? Crohn's disease without complication ([place]) 54/12/8117  ? Umbilical hernia without obstruction and  without gangrene 03/23/2015  ? Essential hypertension 03/23/2015  ? Ovarian CA, left ([place]) 02/11/2015  ? 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  ? CANDIDIASIS OF VULVA AND VAGINA 04/10/2009  ? CONTACT DERMATI

## 2021-08-27 NOTE — Chronic Care Management (AMB) (Signed)
Addendum:  Patient called back to report she did get 3 Symbicort inhaler. Provided instruction on how to reorder Symbicort from [facility] and Me patient assistance program.

## 2021-09-08 ENCOUNTER — Encounter: Payer: Self-pay | Admitting: Family Medicine

## 2021-09-08 ENCOUNTER — Ambulatory Visit (INDEPENDENT_AMBULATORY_CARE_PROVIDER_SITE_OTHER): Payer: Medicare Other | Admitting: Family Medicine

## 2021-09-08 VITALS — BP 130/72 | HR 71 | Temp 98.4°F | Ht 62.0 in | Wt 256.1 lb

## 2021-09-08 DIAGNOSIS — I10 Essential (primary) hypertension: Secondary | ICD-10-CM | POA: Diagnosis not present

## 2021-09-08 DIAGNOSIS — J4540 Moderate persistent asthma, uncomplicated: Secondary | ICD-10-CM

## 2021-09-08 DIAGNOSIS — I5032 Chronic diastolic (congestive) heart failure: Secondary | ICD-10-CM | POA: Diagnosis not present

## 2021-09-08 DIAGNOSIS — F411 Generalized anxiety disorder: Secondary | ICD-10-CM | POA: Diagnosis not present

## 2021-09-08 DIAGNOSIS — Z79899 Other long term (current) drug therapy: Secondary | ICD-10-CM

## 2021-09-08 DIAGNOSIS — E786 Lipoprotein deficiency: Secondary | ICD-10-CM | POA: Diagnosis not present

## 2021-09-08 LAB — COMPREHENSIVE METABOLIC PANEL
ALT: 14 U/L (ref 0–35)
AST: 23 U/L (ref 0–37)
Albumin: 3.4 g/dL — ABNORMAL LOW (ref 3.5–5.2)
Alkaline Phosphatase: 76 U/L (ref 39–117)
BUN: 25 mg/dL — ABNORMAL HIGH (ref 6–23)
CO2: 27 mEq/L (ref 19–32)
Calcium: 9 mg/dL (ref 8.4–10.5)
Chloride: 101 mEq/L (ref 96–112)
Creatinine, Ser: 0.83 mg/dL (ref 0.40–1.20)
GFR: 76.23 mL/min (ref >60.00)
Glucose, Bld: 84 mg/dL (ref 70–99)
Potassium: 4.2 mEq/L (ref 3.5–5.1)
Sodium: 134 mEq/L — ABNORMAL LOW (ref 135–145)
Total Bilirubin: 0.5 mg/dL (ref 0.2–1.2)
Total Protein: 9 g/dL — ABNORMAL HIGH (ref 6.0–8.3)

## 2021-09-08 LAB — LIPID PANEL
Cholesterol: 132 mg/dL (ref 0–200)
HDL: 43.9 mg/dL (ref >39.00)
LDL Cholesterol: 75 mg/dL (ref 0–99)
NonHDL: 88.2
Total CHOL/HDL Ratio: 3
Triglycerides: 68 mg/dL (ref 0.0–149.0)
VLDL: 13.6 mg/dL (ref 0.0–40.0)

## 2021-09-08 MED ORDER — ALBUTEROL SULFATE (2.5 MG/3ML) 0.083% IN NEBU: 2.5000 mg | INHALATION_SOLUTION | Freq: Four times a day (QID) | RESPIRATORY_TRACT | 1 refills | Status: DC | PRN

## 2021-09-08 MED ORDER — ALPRAZOLAM 1 MG PO TABS: 0.5000 mg | ORAL_TABLET | Freq: Every evening | ORAL | 1 refills | Status: DC | PRN

## 2021-09-08 MED ORDER — TORSEMIDE 20 MG PO TABS: 20.0000 mg | ORAL_TABLET | Freq: Every day | ORAL | 1 refills | Status: DC

## 2021-09-08 NOTE — Patient Instructions (Addendum)
Give [place] 2-3 business days to get the results of your labs back.   Keep the diet clean and stay active.  Stay active.   The Shingrix vaccine (for shingles) is a 2 shot series spaced 2-6 months apart. It can make people feel low energy, achy and almost like they have the flu for 48 hours after injection. 1/5 people can have nausea and/or vomiting. Please plan accordingly when deciding on when to get this shot. Call your pharmacy to get this. The second shot of the series is less severe regarding the side effects, but it still lasts 48 hours.   Let us know if you need anything.

## 2021-09-08 NOTE — Progress Notes (Signed)
Chief Complaint  Patient presents with   Follow-up    6 month    Subjective [name] presents for f/u anxiety/depression.  Pt is currently being treated with Zoloft 50 mg/d, Xanax 0.5-1 milligram daily prn.  Reports doing well since treatment. No thoughts of harming self or others. No self-medication with alcohol, prescription drugs or illicit drugs. Pt is not following with a counselor/psychologist.  Hypertension Patient presents for hypertension follow up. She does not monitor home blood pressures. She is compliant with medications- Norvasc 2.5 mg/d, Maxzide 75-50 mg/d. Patient has these side effects of medication: none She is adhering to a healthy diet overall. Exercise: walking, elliptical machine, lifting wts No CP or SOB.   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  The Custom gene panel offered by [facility] includes sequencing and rearrangement analysis for the following 24 genes:  ATM, BAP1, BARD1, BRCA1, BRCA2, BRIP1, CDH1, CDK4, CDKN2A, CHEK2, EPCAM, FANCC, MITF, MLH1, MSH2, MSH6, NBN, PALB2, PMS2, PTEN, RAD51C, RAD51D, TP53, and XRCC2.   The report date is February 13, 2016.   GERD (gastroesophageal reflux disease)    Heart murmur    History of melanoma excision 03/23/2015   Hypertension    Leukopenia due to antineoplastic chemotherapy ([place]) 04/16/2015   Melanoma ([place]) dx'd 2003   right leg   Morbid obesity with BMI of 50.0-59.9, adult ([place]) 03/23/2015   OPEN WOUND OF KNEE LEG AND ANKLE COMPLICATED 32/44/0102   Qualifier:  Diagnosis of  By: Patsy Baltimore RN, Denise     ovarian ca dx'd 10/216   Seasonal allergies    Umbilical hernia without obstruction and without gangrene 03/23/2015   Urinary, incontinence, stress female 10/27/2016   Allergies as of 09/08/2021       Reactions   Butorphanol Tartrate Other (See Comments)   Gave her the shakes for 6 weeks   Levofloxacin Palpitations   REACTION: tachycardia   Moxifloxacin Palpitations   REACTION: tachycardia but tolerated cirpofloxacin just fine   Stadol [butorphanol] Other (See Comments)   Caused shakes for 6 weeks "shaking for weeks"   Amoxicillin Rash   REACTION: Rash with high doses. Can take lower doses like 520m   Asa [aspirin] Other (See Comments)   Pt stated the 81 mg caused Exacerbation of her asthma   Ciprofloxacin Hcl Nausea And Vomiting   Can take lower doses like 509m   Clindamycin/lincomycin Nausea And Vomiting   Codeine Nausea And Vomiting   Effexor [venlafaxine] Other (See Comments)   hyperactivity Panic attacks   Erythromycin Diarrhea, Nausea And Vomiting   Latex Rash   Losartan Nausea Only, Other (See Comments)   Headache, fatigue   Paroxetine Other (See Comments)   Made nerves worse and caused fatigue   Cephalosporins Other (See Comments)   REACTION: she has tolerated rocephin and Keflex without difficulty   Clarithromycin Diarrhea   Clindamycin Diarrhea   Influenza Vaccines Other (See Comments)   Pt states that she got really sick from flu vaccine was sick a total of 6  weeks   Paxil [paroxetine Hcl] Other (See Comments)   fatigue   Tape Rash        Medication List        Accurate as of Sep 08, 2021 12:07 PM. If you have any questions, ask your nurse or doctor.          ACIDOPHILUS PO Take 2 capsules by mouth daily.   albuterol 108 (90 Base) MCG/ACT inhaler Commonly known as: VENTOLIN HFA Inhale 1-2 puffs into the lungs every 6 (six) hours as needed for wheezing or shortness of breath.   albuterol (2.5 MG/3ML)  0.083% nebulizer solution Commonly known as: PROVENTIL Take 3 mLs (2.5 mg total) by nebulization every 6 (six) hours as needed for wheezing or shortness of breath.   ALPRAZolam 1 MG tablet Commonly known as: XANAX Take 0.5-1 tablets (0.5-1 mg total) by mouth at bedtime as needed for anxiety.   amLODipine 2.5 MG tablet Commonly known as: NORVASC Take 1 tablet (2.5 mg total) by mouth in the morning and at bedtime.   diclofenac Sodium 1 % Gel Commonly known as: Voltaren Apply 2 g topically 4 (four) times daily.   ELDERBERRY PO Take 1,250 mg by mouth daily.   Nebulizers Misc 1 Units by Misc.(Non-Drug; Combo Route) route every 8 (eight) hours as needed.   pantoprazole 40 MG tablet Commonly known as: PROTONIX Take 1 tablet (40 mg total) by mouth daily.   sertraline 50 MG tablet Commonly known as: ZOLOFT Take 1 tablet (50 mg total) by mouth daily.   Symbicort 160-4.5 MCG/ACT inhaler Generic drug: budesonide-formoterol INHALE 2 PUFFS INTO THE LUNGS TWICE DAILY   torsemide 20 MG tablet Commonly known as: DEMADEX Take 1 tablet (20 mg total) by mouth daily. Started by: Shelda Pal, DO   triamterene-hydrochlorothiazide 75-50 MG tablet Commonly known as: MAXZIDE Take 1 tablet by mouth daily. Reported on 06/12/2015   VITAMIN E PO Take by mouth daily.        Exam BP 130/72   Pulse 71   Temp 98.4 F (36.9 C) (Oral)   Ht 5' 2"  (1.575 m)   Wt 256 lb 2 oz (116.2 kg)   LMP  (LMP Unknown)   SpO2 99%   BMI 46.85 kg/m  General:  well developed, well nourished, in no apparent distress Heart: RRR, nonpitting 1+ edema in the lower extremities tapering in the mid tibia Lungs: Clear to auscultation bilaterally.  No respiratory distress Psych: well oriented with normal range of affect and age-appropriate judgement/insight, alert and oriented x4.  Assessment and Plan  Anxiety state  Essential hypertension  Moderate persistent asthma without complication  Chronic  diastolic (congestive) heart failure (HCC) - Plan: torsemide (DEMADEX) 20 MG tablet  Encounter for long-term (current) use of high-risk medication - Plan: Drug Monitoring Panel [phone] , Urine  Low HDL (under 40) - Plan: Comprehensive metabolic panel, Lipid panel  Chronic, stable. Cont Zoloft 50 mg/d, Xanax, CSC and UDS today.  Chronic, stable. Cont Norvasc 2.5 mg/d, Maxzide 75-50 mg/d.  Chronic, now stable.  Continue torsemide 20 mg daily as needed.  Swelling is better today. F/u in 6 mo pending the above. The patient voiced understanding and agreement to the plan.  [place], DO 09/08/21 12:07 PM

## 2021-09-10 LAB — DRUG MONITORING PANEL 376104, URINE
Amphetamines: NEGATIVE ng/mL (ref <500)
Barbiturates: NEGATIVE ng/mL (ref <300)
Benzodiazepines: NEGATIVE ng/mL (ref <100)
Cocaine Metabolite: NEGATIVE ng/mL (ref <150)
Desmethyltramadol: NEGATIVE ng/mL (ref <100)
Opiates: NEGATIVE ng/mL (ref <100)
Oxycodone: NEGATIVE ng/mL (ref <100)
Tramadol: NEGATIVE ng/mL (ref <100)

## 2021-09-10 LAB — DM TEMPLATE

## 2021-09-28 ENCOUNTER — Other Ambulatory Visit: Payer: Self-pay | Admitting: Family Medicine

## 2021-09-28 DIAGNOSIS — I5032 Chronic diastolic (congestive) heart failure: Secondary | ICD-10-CM

## 2021-09-30 ENCOUNTER — Other Ambulatory Visit: Payer: Self-pay

## 2021-09-30 DIAGNOSIS — I5032 Chronic diastolic (congestive) heart failure: Secondary | ICD-10-CM

## 2021-09-30 MED ORDER — TORSEMIDE 20 MG PO TABS: ORAL_TABLET | ORAL | 0 refills | Status: DC

## 2021-09-30 NOTE — Telephone Encounter (Signed)
Patient is requesting 90 day supply. #30 sent in.

## 2021-10-16 ENCOUNTER — Telehealth: Payer: Self-pay | Admitting: *Deleted

## 2021-10-16 NOTE — Telephone Encounter (Signed)
Patient called and let a message wanted to know if she still needed a CA-125 drawn. Per Dr Delsa Sale and Dr Denman George last notes patient is more than five years from her diagnosis; the patient no longer needs a CA-125 lab draw. Explained to the patient that she no longer needs lab draws, but to continue with the years annual exams. Patient advised to call the office in January for an appt in February

## 2021-11-18 ENCOUNTER — Telehealth: Payer: Self-pay | Admitting: Family Medicine

## 2021-11-18 NOTE — Telephone Encounter (Signed)
Left message for patient to call back and schedule Medicare Annual Wellness Visit (AWV).   Please offer to do virtually or by telephone.  Left office number and my jabber #[phone].  AWVI eligible as of  04/12/2009  Please schedule at anytime with Nurse Health Advisor.   

## 2021-12-04 ENCOUNTER — Telehealth: Payer: Self-pay | Admitting: Family Medicine

## 2021-12-04 NOTE — Telephone Encounter (Signed)
LVM for patient to call back and schedule a medicare annual welness visit at their earliest convinience.

## 2021-12-06 ENCOUNTER — Other Ambulatory Visit: Payer: Self-pay | Admitting: Family Medicine

## 2021-12-18 ENCOUNTER — Telehealth: Payer: Self-pay | Admitting: Family Medicine

## 2021-12-18 MED ORDER — AMLODIPINE BESYLATE 2.5 MG PO TABS: ORAL_TABLET | ORAL | 2 refills | Status: DC

## 2021-12-18 MED ORDER — ALPRAZOLAM 1 MG PO TABS: 0.5000 mg | ORAL_TABLET | Freq: Every evening | ORAL | 0 refills | Status: DC | PRN

## 2021-12-18 MED ORDER — PANTOPRAZOLE SODIUM 40 MG PO TBEC
DELAYED_RELEASE_TABLET | ORAL | 2 refills | Status: DC
Start: 2021-12-18 — End: 2022-07-21

## 2021-12-18 MED ORDER — SERTRALINE HCL 50 MG PO TABS: 50.0000 mg | ORAL_TABLET | Freq: Every day | ORAL | 3 refills | Status: DC

## 2021-12-18 NOTE — Telephone Encounter (Signed)
Last OV--09/08/2021 Last RF--#45 with 1 refill 09/08/2021 CSC/UDS--- 09/08/2021

## 2021-12-18 NOTE — Telephone Encounter (Signed)
Patient is requesting call back from Sharon. She stated it was regarding several things. Please call patient back at [phone] to advise.

## 2021-12-18 NOTE — Telephone Encounter (Signed)
Will send in 45 tabs w no refills to determine how often she is using.

## 2022-01-04 ENCOUNTER — Telehealth: Payer: Self-pay | Admitting: Pharmacist

## 2022-01-04 NOTE — Telephone Encounter (Signed)
Received fax requesting updated Rx for Symbicort for [place]. Completed Rx. Forwarded to PCP to review and sign

## 2022-01-06 DIAGNOSIS — Z85820 Personal history of malignant melanoma of skin: Secondary | ICD-10-CM | POA: Diagnosis not present

## 2022-01-06 DIAGNOSIS — L817 Pigmented purpuric dermatosis: Secondary | ICD-10-CM | POA: Diagnosis not present

## 2022-01-06 DIAGNOSIS — D225 Melanocytic nevi of trunk: Secondary | ICD-10-CM | POA: Diagnosis not present

## 2022-01-06 DIAGNOSIS — L718 Other rosacea: Secondary | ICD-10-CM | POA: Diagnosis not present

## 2022-01-13 ENCOUNTER — Ambulatory Visit: Payer: Medicare Other | Admitting: Family Medicine

## 2022-02-22 ENCOUNTER — Ambulatory Visit (INDEPENDENT_AMBULATORY_CARE_PROVIDER_SITE_OTHER): Payer: Medicare Other | Admitting: Pharmacist

## 2022-02-22 DIAGNOSIS — J4540 Moderate persistent asthma, uncomplicated: Secondary | ICD-10-CM

## 2022-02-22 NOTE — Progress Notes (Unsigned)
Pharmacy Note  02/22/2022 Name: [name] MRN: [id number] DOB: 01-30-1961  Subjective: [name] is a 61 y.o. year old female who is a primary care patient of Shelda Pal, DO. Clinical Pharmacist Practitioner referral was placed to assist with medication management.    Engaged with patient by telephone for follow up visit today.  Asthma - patient has been receiving Symbicort from patient assistance program [place] [employer] for 2023 but in 2024 [place] [employer] [place] no longer supply Symbicort thru this program (Symbicort has a generic available now).  She reports she tried Advair in the past but she was "allergic" to something in Advair - it make her nauseated / caused vomiting.    Lab Results  Component Value Date   HGBA1C 6.1 05/12/2009       Component Value Date/Time   CHOL 132 09/08/2021 1054   TRIG 68.0 09/08/2021 1054   HDL 43.90 09/08/2021 1054   CHOLHDL 3 09/08/2021 1054   VLDL 13.6 09/08/2021 1054   LDLCALC 75 09/08/2021 1054   LDLCALC 84 01/15/2020 1358     Allergies  Allergen Reactions   Butorphanol Tartrate Other (See Comments)    Gave her the shakes for 6 weeks   Levofloxacin Palpitations    REACTION: tachycardia   Moxifloxacin Palpitations    REACTION: tachycardia but tolerated cirpofloxacin just fine   Stadol [Butorphanol] Other (See Comments)    Caused shakes for 6 weeks "shaking for weeks"   Amoxicillin Rash    REACTION: Rash with high doses. Can take lower doses like 516m   Asa [Aspirin] Other (See Comments)    Pt stated the 81 mg caused Exacerbation of her asthma    Ciprofloxacin Hcl Nausea And Vomiting    Can take lower doses like 5043m   Clindamycin/Lincomycin Nausea And Vomiting   Codeine Nausea And Vomiting   Effexor [Venlafaxine] Other (See Comments)    hyperactivity Panic attacks   Erythromycin Diarrhea and Nausea And Vomiting   Latex Rash   Losartan Nausea Only and Other (See Comments)    Headache, fatigue    Paroxetine Other (See Comments)    Made nerves worse and caused fatigue   Cephalosporins Other (See Comments)    REACTION: she has tolerated rocephin and Keflex without difficulty   Clarithromycin Diarrhea   Clindamycin Diarrhea   Influenza Vaccines Other (See Comments)    Pt states that she got really sick from flu vaccine was sick a total of 6 weeks   Paxil [Paroxetine Hcl] Other (See Comments)    fatigue   Tape Rash    Medications Reviewed Today     Reviewed by WeShelda PalDO (Physician) on 09/08/21 at 10Jolleyist Status: <None>   Medication Order Taking? Sig Documenting Provider Last Dose Status Informant  albuterol (PROVENTIL) (2.5 MG/3ML) 0.083% nebulizer solution [id number] Take 3 mLs (2.5 mg total) by nebulization every 6 (six) hours as needed for wheezing or shortness of breath. WeShelda PalDO Taking Active   albuterol (VENTOLIN HFA) 108 (90 Base) MCG/ACT inhaler [id number] Inhale 1-2 puffs into the lungs every 6 (six) hours as needed for wheezing or shortness of breath. WeShelda PalDO Taking Active   ALPRAZolam (XDuanne Moron1 MG tablet [id number] 0.5-1 tablets (0.5-1 mg total) by mouth at bedtime as needed for anxiety. WeShelda PalDO  Active   amLODipine (NORVASC) 2.5 MG tablet [id number] Take 1 tablet (2.5 mg total) by  mouth in the morning and at bedtime. Shelda Pal, DO Taking Active   diclofenac Sodium (VOLTAREN) 1 % GEL [id number] Yes Apply 2 g topically 4 (four) times daily. Shelda Pal, DO Taking Active   ELDERBERRY PO [id number] Yes Take 1,250 mg by mouth daily. [provider] Taking Active   Lactobacillus (ACIDOPHILUS PO) [id number] Yes Take 2 capsules by mouth daily.  [provider] Taking Active Self  Nebulizers MISC [id number] Yes 1 Units by Misc.(Non-Drug; Combo Route) route every 8 (eight) hours as needed. [provider] Taking Active   pantoprazole  (PROTONIX) 40 MG tablet [id number] Yes Take 1 tablet (40 mg total) by mouth daily. Shelda Pal, DO Taking Active   sertraline (ZOLOFT) 50 MG tablet [id number] Yes Take 1 tablet (50 mg total) by mouth daily. Shelda Pal, DO Taking Active   SYMBICORT 160-4.5 MCG/ACT inhaler [id number] Yes INHALE 2 PUFFS INTO THE LUNGS TWICE DAILY Shelda Pal, DO Taking Active   torsemide (DEMADEX) 20 MG tablet [id number]  Take 1 tablet (20 mg total) by mouth daily. Shelda Pal, DO  Active   triamterene-hydrochlorothiazide (MAXZIDE) 75-50 MG tablet [id number] Yes Take 1 tablet by mouth daily. Reported on 06/12/2015 Shelda Pal, DO Taking Active   VITAMIN E PO [id number] Yes Take by mouth daily. [provider] Taking Active             Patient Active Problem List   Diagnosis Date Noted   Moderate persistent asthma without complication 30/10/6224   Chronic diastolic (congestive) heart failure ([place]) 08/13/2020   Thrombocytopenia, unspecified ([place]) 08/13/2020   History of DVT (deep vein thrombosis) 06/12/2019   Cough 11/30/2018   GAD (generalized anxiety disorder) 11/10/2018   Urinary, incontinence, stress female 10/27/2016   Genetic testing 02/17/2016   Morbid obesity with BMI of 45.0-49.9, adult ([place]) 10/21/2015   Port catheter in place 07/30/2015   Abscess of groin, right 06/19/2015   Noncompliance with therapeutic plan 06/15/2015   Chemotherapy induced neutropenia ([place]) 05/17/2015   Portacath in place 05/11/2015   Chemotherapy induced thrombocytopenia 05/11/2015   Hypokalemia 04/25/2015   Antineoplastic chemotherapy induced pancytopenia ([place]) 04/25/2015   Fever 04/25/2015   Rash 04/25/2015   Diarrhea 04/25/2015   Anemia due to antineoplastic chemotherapy 33/35/4562   Folliculitis 56/38/9373   Leukopenia due to antineoplastic chemotherapy ([place]) 04/16/2015   Morbid obesity with BMI of 50.0-59.9, adult ([place]) 03/23/2015   History of  melanoma excision 03/23/2015   Asthma exacerbation attacks 03/23/2015   Crohn's disease without complication ([place]) 42/87/6811   Umbilical hernia without obstruction and without gangrene 03/23/2015   Essential hypertension 03/23/2015   Ovarian CA, left ([place]) 02/11/2015   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   CANDIDIASIS OF VULVA AND VAGINA 04/10/2009   CONTACT DERMATITIS&OTHER ECZEMA DUE UNSPEC CAUSE 04/10/2009   CELLULITIS AND ABSCESS OF LEG EXCEPT FOOT 03/24/2009   EDEMA 03/24/2009   OPEN WOUND OF KNEE LEG AND ANKLE COMPLICATED 57/26/2035   RECTAL BLEEDING 10/15/2008   MORBID OBESITY 06/01/2007   Anxiety state 06/01/2007   HYPERTENSION 06/01/2007   Asthma 06/01/2007   GASTROESOPHAGEAL REFLUX DISEASE 06/01/2007   CROHN'S DISEASE, SMALL INTESTINE 06/01/2007   Arthritis 06/01/2007   MALIGNANT MELANOMA, HX OF 06/01/2007   Skin cancer 04/12/2001     Medication Assistance:   Symbicort inhaler obtained through [place] [employer] medication assistance program  medication assistance program.  Enrollment ends 04/11/2022   Assessment / Plan: Asthma - controlled Reviewed patient's 2024 formulary for [facility] Medicare. The  follow medications will be tier 3 or $47 / month:   Generic Symbicort  Generic Advair / Wixela or brand Advair  Breo Patient should be able to request 1 more refill or Symbicort in 2023 for 90 days. Will give [place] time to determine what to do in 2024. Could change to [facility]. Their medication assistance program for 2023 at least requires patient to spend $600 out of pocket before they will consider medication assistance program application.  Also assessed for LIS today. Applied for LIS - decision pending (patient is very close to the cut off). Should received letter in about 21 business day with LIS / Extra Help decision.   Follow Up:  in January 2024   Cherre Robins, PharmD Clinical Pharmacist [place] [phone]

## 2022-02-25 ENCOUNTER — Other Ambulatory Visit (HOSPITAL_COMMUNITY): Payer: Self-pay

## 2022-02-25 ENCOUNTER — Encounter: Payer: Self-pay | Admitting: Oncology

## 2022-03-09 ENCOUNTER — Telehealth: Payer: Self-pay | Admitting: Family Medicine

## 2022-03-09 NOTE — Telephone Encounter (Signed)
Patient received the mail from [facility] and wants a callback to discuss. She needs help with completing it.

## 2022-03-10 NOTE — Telephone Encounter (Signed)
Called [name] back but questions was actually about her husband's application for [place]. See if chart for documentation.

## 2022-03-16 ENCOUNTER — Ambulatory Visit (INDEPENDENT_AMBULATORY_CARE_PROVIDER_SITE_OTHER): Payer: Medicare Other | Admitting: Family Medicine

## 2022-03-16 ENCOUNTER — Encounter: Payer: Medicare Other | Admitting: Family Medicine

## 2022-03-16 ENCOUNTER — Encounter: Payer: Self-pay | Admitting: Family Medicine

## 2022-03-16 VITALS — BP 120/80 | HR 76 | Temp 98.6°F | Ht 64.0 in | Wt 258.5 lb

## 2022-03-16 DIAGNOSIS — Z0000 Encounter for general adult medical examination without abnormal findings: Secondary | ICD-10-CM

## 2022-03-16 DIAGNOSIS — I10 Essential (primary) hypertension: Secondary | ICD-10-CM

## 2022-03-16 LAB — COMPREHENSIVE METABOLIC PANEL
ALT: 15 U/L (ref 0–35)
AST: 23 U/L (ref 0–37)
Albumin: 3.4 g/dL — ABNORMAL LOW (ref 3.5–5.2)
Alkaline Phosphatase: 81 U/L (ref 39–117)
BUN: 21 mg/dL (ref 6–23)
CO2: 26 mEq/L (ref 19–32)
Calcium: 8.7 mg/dL (ref 8.4–10.5)
Chloride: 101 mEq/L (ref 96–112)
Creatinine, Ser: 0.88 mg/dL (ref 0.40–1.20)
GFR: 70.81 mL/min (ref >60.00)
Glucose, Bld: 83 mg/dL (ref 70–99)
Potassium: 4.2 mEq/L (ref 3.5–5.1)
Sodium: 134 mEq/L — ABNORMAL LOW (ref 135–145)
Total Bilirubin: 0.5 mg/dL (ref 0.2–1.2)
Total Protein: 8.9 g/dL — ABNORMAL HIGH (ref 6.0–8.3)

## 2022-03-16 LAB — LIPID PANEL
Cholesterol: 140 mg/dL (ref 0–200)
HDL: 49.7 mg/dL (ref >39.00)
LDL Cholesterol: 80 mg/dL (ref 0–99)
NonHDL: 90.76
Total CHOL/HDL Ratio: 3
Triglycerides: 54 mg/dL (ref 0.0–149.0)
VLDL: 10.8 mg/dL (ref 0.0–40.0)

## 2022-03-16 MED ORDER — PHENTERMINE HCL 37.5 MG PO CAPS: 37.5000 mg | ORAL_CAPSULE | ORAL | 0 refills | Status: DC

## 2022-03-16 MED ORDER — ALPRAZOLAM 1 MG PO TABS: 0.5000 mg | ORAL_TABLET | Freq: Every evening | ORAL | 1 refills | Status: DC | PRN

## 2022-03-16 NOTE — Patient Instructions (Addendum)
Give [place] 2-3 business days to get the results of your labs back.   Keep the diet clean and stay active.  Consider using GoodRx for phentermine.   Let us know if you need anything.

## 2022-03-16 NOTE — Progress Notes (Signed)
Chief Complaint  Patient presents with   Annual Exam     Well Woman [name] is here for a complete physical.   Her last physical was >1 year ago.  Current diet: in general, diet is fair. Current exercise: elliptical, lifting weights. Weight is stable and she denies fatigue out of ordinary. No LMP recorded (lmp unknown). Patient has had a hysterectomy. Seatbelt? Yes Advanced directive? No  Health Maintenance Pap/HPV- N/A, had a hysterectomy Mammogram- Yes Colon cancer screening-Yes Shingrix- Yes; due for 2nd in a mo Tetanus- Yes Hep C screening- Yes HIV screening- Yes  Obesity Pt has had issues with her weight. No interest in wt loss surgery or seeing a weight loss clinic. She tried fasting without good results. Diet/exercise as above. Interested in a medication to help things.   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  The Custom gene panel offered by [facility] includes sequencing and rearrangement analysis for the following 24 genes:  ATM, BAP1, BARD1, BRCA1, BRCA2, BRIP1, CDH1, CDK4, CDKN2A, CHEK2, EPCAM, FANCC, MITF, MLH1, MSH2, MSH6, NBN, PALB2, PMS2, PTEN, RAD51C, RAD51D, TP53, and XRCC2.   The report date is February 13, 2016.   GERD (gastroesophageal reflux disease)    Heart murmur    History of melanoma excision 03/23/2015   Hypertension    Leukopenia due to antineoplastic chemotherapy ([place]) 04/16/2015   Melanoma ([place]) dx'd 2003   right leg   Morbid obesity with BMI of 50.0-59.9, adult ([place]) 03/23/2015   OPEN WOUND OF KNEE LEG  AND ANKLE COMPLICATED 34/19/3790   Qualifier: Diagnosis of  By: Patsy [place] RN, Denise     ovarian ca dx'd 10/216   Seasonal allergies    Umbilical hernia without obstruction and without gangrene 03/23/2015   Urinary, incontinence, stress female 10/27/2016     Past Surgical History:  Procedure Laterality Date   ABDOMINAL HYSTERECTOMY Bilateral 02/11/2015   Procedure: HYSTERECTOMY ABDOMINAL, bilateral salpingo-oophorectomy;  Surgeon: Louretta Shorten, MD;  Location: [facility];  Service: Gynecology;  Laterality: Bilateral;   COLONOSCOPY WITH ESOPHAGOGASTRODUODENOSCOPY (EGD)     DILATATION & CURETTAGE/HYSTEROSCOPY WITH TRUECLEAR N/A 08/17/2013   Procedure: DILATATION & CURETTAGE/HYSTEROSCOPY WITH TRUCLEAR;  Surgeon: Luz Lex, MD;  Location: [facility];  Service: Gynecology;  Laterality: N/A;   IR REMOVAL TUN ACCESS W/ PORT W/O FL MOD SED  07/05/2017   MELANOMA EXCISION  06/2001   right leg; also removed 2 lymph nodes   OMENTECTOMY N/A 02/11/2015   Procedure: OMENTECTOMY;  Surgeon: Louretta Shorten, MD;  Location: [facility];  Service: Gynecology;  Laterality: N/A;   UMBILICAL HERNIA REPAIR      Medications  Current Outpatient Medications on File Prior to Visit  Medication Sig Dispense Refill   albuterol (PROVENTIL) (2.5 MG/3ML) 0.083% nebulizer solution Take 3 mLs (2.5 mg total) by nebulization every 6 (six) hours as needed for wheezing or shortness of breath. 150 mL 1   albuterol (VENTOLIN HFA) 108 (90 Base) MCG/ACT inhaler Inhale 1-2 puffs into the lungs every 6 (six) hours as  needed for wheezing or shortness of breath. 18 g 2   ALPRAZolam (XANAX) 1 MG tablet Take 0.5-1 tablets (0.5-1 mg total) by mouth at bedtime as needed for anxiety. 45 tablet 0   amLODipine (NORVASC) 2.5 MG tablet TAKE 1 TABLET(2.5 MG) BY MOUTH IN THE MORNING AND AT BEDTIME 180 tablet 2   diclofenac Sodium (VOLTAREN) 1 % GEL Apply 2 g topically 4 (four) times daily. 100 g 2   ELDERBERRY PO Take 1,250 mg by mouth daily.     Lactobacillus  (ACIDOPHILUS PO) Take 2 capsules by mouth daily.      Nebulizers MISC 1 Units by Misc.(Non-Drug; Combo Route) route every 8 (eight) hours as needed.     pantoprazole (PROTONIX) 40 MG tablet TAKE 1 TABLET(40 MG) BY MOUTH DAILY 90 tablet 2   sertraline (ZOLOFT) 50 MG tablet Take 1 tablet (50 mg total) by mouth daily. 90 tablet 3   SYMBICORT 160-4.5 MCG/ACT inhaler INHALE 2 PUFFS INTO THE LUNGS TWICE DAILY 30.6 g 3   torsemide (DEMADEX) 20 MG tablet TAKE 1 TABLET(20 MG) BY MOUTH DAILY 90 tablet 0   triamterene-hydrochlorothiazide (MAXZIDE) 75-50 MG tablet Take 1 tablet by mouth daily. Reported on 06/12/2015 90 tablet 3   VITAMIN E PO Take by mouth daily.     Allergies Allergies  Allergen Reactions   Butorphanol Tartrate Other (See Comments)    Gave her the shakes for 6 weeks   Levofloxacin Palpitations    REACTION: tachycardia   Moxifloxacin Palpitations    REACTION: tachycardia but tolerated cirpofloxacin just fine   Stadol [Butorphanol] Other (See Comments)    Caused shakes for 6 weeks "shaking for weeks"   Amoxicillin Rash    REACTION: Rash with high doses. Can take lower doses like 535m   Asa [Aspirin] Other (See Comments)    Pt stated the 81 mg caused Exacerbation of her asthma    Ciprofloxacin Hcl Nausea And Vomiting    Can take lower doses like 5061m   Clindamycin/Lincomycin Nausea And Vomiting   Codeine Nausea And Vomiting   Effexor [Venlafaxine] Other (See Comments)    hyperactivity Panic attacks   Erythromycin Diarrhea and Nausea And Vomiting   Latex Rash   Losartan Nausea Only and Other (See Comments)    Headache, fatigue   Paroxetine Other (See Comments)    Made nerves worse and caused fatigue   Cephalosporins Other (See Comments)    REACTION: she has tolerated rocephin and Keflex without difficulty   Clarithromycin Diarrhea   Clindamycin Diarrhea   Influenza Vaccines Other (See Comments)    Pt states that she got really sick from flu vaccine was sick a total of 6  weeks   Paxil [Paroxetine Hcl] Other (See Comments)    fatigue   Tape Rash    Review of Systems: Constitutional:  no unexpected weight changes Eye:  no recent significant change in vision Ear/Nose/Mouth/Throat:  Ears:  no recent change in hearing Nose/Mouth/Throat:  no complaints of nasal congestion, no sore throat Cardiovascular: no chest pain Respiratory:  no shortness of breath Gastrointestinal:  no abdominal pain, no change in bowel habits GU:  Female: negative for dysuria or pelvic pain Musculoskeletal/Extremities:  no pain of the joints Integumentary (Skin/Breast):  no abnormal skin lesions reported Neurologic:  no headaches Endocrine:  denies fatigue  Exam BP 120/80 (BP Location: Left Arm, Patient Position: Sitting, Cuff Size: Large)   Pulse 76   Temp 98.6 F (37 C) (Oral)   Ht _0  (1.626  m)   Wt 258 lb 8 oz (117.3 kg)   LMP  (LMP Unknown)   SpO2 95%   BMI 44.37 kg/m  General:  well developed, well nourished, in no apparent distress Skin:  no significant moles, warts, or growths Head:  no masses, lesions, or tenderness Eyes:  pupils equal and round, sclera anicteric without injection Ears:  canals without lesions, TMs shiny without retraction, no obvious effusion, no erythema Nose:  nares patent, mucosa normal, and no drainage  Throat/Pharynx:  lips and gingiva without lesion; tongue and uvula midline; non-inflamed pharynx; no exudates or postnasal drainage Neck: neck supple without adenopathy, thyromegaly, or masses Lungs:  clear to auscultation, breath sounds equal bilaterally, no respiratory distress Cardio:  regular rate and rhythm, no LE edema Abdomen:  abdomen soft, nontender; bowel sounds normal; no masses or organomegaly Genital: Defer to GYN Musculoskeletal:  symmetrical muscle groups noted without atrophy or deformity Extremities:  no clubbing, cyanosis, or edema, no deformities, no skin discoloration Neuro:  gait normal; deep tendon reflexes normal and  symmetric Psych: well oriented with normal range of affect and appropriate judgment/insight  Assessment and Plan  Well adult exam  Essential hypertension - Plan: Comprehensive metabolic panel, Lipid panel  MORBID OBESITY - Plan: phentermine 37.5 MG capsule   Well 61 y.o. female. Advanced directive form provided today.  Morbid obesity: start phentermine 37.5 mg/d. Counseled on diet and exercise. Follow up in 1 mo to reck weight. Politely declined seeing a wt loss specialist or bariatric surgeon. Other orders as above. The patient voiced understanding and agreement to the plan.  [place], DO 03/16/22 10:31 AM

## 2022-03-17 ENCOUNTER — Telehealth: Payer: Self-pay

## 2022-03-17 NOTE — Telephone Encounter (Signed)
See result notes. 

## 2022-03-24 ENCOUNTER — Encounter: Payer: Self-pay | Admitting: Family Medicine

## 2022-03-24 ENCOUNTER — Telehealth (INDEPENDENT_AMBULATORY_CARE_PROVIDER_SITE_OTHER): Payer: Medicare Other | Admitting: Family Medicine

## 2022-03-24 ENCOUNTER — Telehealth: Payer: Self-pay | Admitting: Family Medicine

## 2022-03-24 DIAGNOSIS — J069 Acute upper respiratory infection, unspecified: Secondary | ICD-10-CM

## 2022-03-24 DIAGNOSIS — J4541 Moderate persistent asthma with (acute) exacerbation: Secondary | ICD-10-CM | POA: Diagnosis not present

## 2022-03-24 MED ORDER — ALBUTEROL SULFATE (2.5 MG/3ML) 0.083% IN NEBU: 2.5000 mg | INHALATION_SOLUTION | Freq: Four times a day (QID) | RESPIRATORY_TRACT | 1 refills | Status: DC | PRN

## 2022-03-24 MED ORDER — METHYLPREDNISOLONE 4 MG PO TBPK: ORAL_TABLET | ORAL | 0 refills | Status: DC

## 2022-03-24 NOTE — Progress Notes (Signed)
No chief complaint on file.   [name] here for URI complaints. Due to COVID-19 pandemic, we are interacting via telephone. I verified patient's ID using 2 identifiers. Patient agreed to proceed with visit via this method. Patient is at home, I am at office. Patient and I are present for visit.   Duration: 1 week  Associated symptoms: sinus pain, rhinorrhea, wheezing, shortness of breath, and coughing Denies: sinus congestion, itchy watery eyes, ear pain, ear drainage, sore throat, wheezing, shortness of breath, myalgia, and fevers Treatment to date: Tylenol Sick contacts: No; did go to a concert prior to having symptoms  Past Medical History:  Diagnosis Date   Anemia due to antineoplastic chemotherapy 04/16/2015   Anxiety state 06/01/2007   Qualifier: Diagnosis of  By: Ronnald Ramp RN, CGRN, Sheri     Asthma    Asymptomatic proteinuria    Intermittent   CHF (congestive heart failure) (HCC)    COPD (chronic obstructive pulmonary disease) ([place])    Crohn's disease without complication ([place]) 19/75/8832   Depression    Genetic testing 02/17/2016   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  The Custom gene panel offered by [facility] includes sequencing and rearrangement analysis for the following 24 genes:  ATM, BAP1, BARD1, BRCA1, BRCA2, BRIP1, CDH1, CDK4, CDKN2A, CHEK2, EPCAM, FANCC, MITF, MLH1, MSH2, MSH6, NBN, PALB2, PMS2, PTEN, RAD51C, RAD51D, TP53, and XRCC2.   The report date is February 13, 2016.   GERD (gastroesophageal reflux disease)    Heart murmur    History of melanoma excision 03/23/2015   Hypertension    Leukopenia due to antineoplastic chemotherapy ([place]) 04/16/2015   Melanoma ([place]) dx'd 2003   right leg   Morbid obesity with BMI of 50.0-59.9, adult ([place]) 03/23/2015   OPEN WOUND OF KNEE LEG AND ANKLE COMPLICATED 54/98/2641   Qualifier: Diagnosis of  By: Patsy [place] RN, Denise     ovarian ca dx'd 10/216   Seasonal allergies    Umbilical hernia without obstruction  and without gangrene 03/23/2015   Urinary, incontinence, stress female 10/27/2016    Objective No conversational dyspnea Age appropriate judgment and insight Nml affect and mood  Viral URI with cough  Moderate persistent asthma with exacerbation  Continue to push fluids, practice good hand hygiene, cover mouth when coughing. F/u prn. If starting to experience fevers, shaking, or shortness of breath, seek immediate care. Total time: 11 min Pt voiced understanding and agreement to the plan.  [place], DO 03/24/22 11:00 AM

## 2022-03-24 NOTE — Telephone Encounter (Signed)
Called and added at 11:30 today.

## 2022-03-24 NOTE — Telephone Encounter (Signed)
Called back She has a cold/congestion/cough and would like to know if a dose pack (#21 methylprednisolone) could be sent in for her? She does not want to start the diet pill, will wait until the new year.

## 2022-03-24 NOTE — Telephone Encounter (Signed)
Pt called and stated she wanted to talk to Shirlean Mylar about a couple things regarding her and her husband.

## 2022-03-26 ENCOUNTER — Telehealth: Payer: Self-pay | Admitting: Pharmacist

## 2022-03-26 DIAGNOSIS — J4540 Moderate persistent asthma, uncomplicated: Secondary | ICD-10-CM

## 2022-03-26 MED ORDER — BUDESONIDE-FORMOTEROL FUMARATE 160-4.5 MCG/ACT IN AERO: 2.0000 | INHALATION_SPRAY | Freq: Two times a day (BID) | RESPIRATORY_TRACT | 2 refills | Status: DC

## 2022-03-26 NOTE — Telephone Encounter (Signed)
Spoke to pharmacist at [facility]. They did not have brand Symbicort in stock but they do have generic. He ran claim for generic Symbicort - cost will be $4.15 per 30 days.  Patient's LIS / Extra help has already started.

## 2022-03-26 NOTE — Telephone Encounter (Signed)
Patient was approved for full LIS per letter she received 03/13/2022.  She is requesting refill for Symbicort inhaler. Patient asked for Rx to be sent to [facility] on [address].

## 2022-03-29 NOTE — Telephone Encounter (Signed)
Patient called to request a call back from [facility]. She did not go into detail but stated it is regarding her medication. Please advise.

## 2022-03-30 MED ORDER — ALBUTEROL SULFATE HFA 108 (90 BASE) MCG/ACT IN AERS: 1.0000 | INHALATION_SPRAY | Freq: Four times a day (QID) | RESPIRATORY_TRACT | 3 refills | Status: DC | PRN

## 2022-03-30 NOTE — Telephone Encounter (Signed)
   [  2:30 PM] [name] [phone] - [name] is returning your call. Available for transfer? [2:30 PM] [name] on the phone with 1:45pm [2:30 PM] [place], [name] ok ill let her know ty

## 2022-03-30 NOTE — Addendum Note (Signed)
Addended by: Cherre Robins B on: 03/30/2022 04:11 PM   Modules accepted: Orders

## 2022-03-30 NOTE — Telephone Encounter (Signed)
Patient would like to get Brand Symbicort if possible. She also prefers either Proventil or the "red inhaler" for albuterol. Not sure is these are preferred by her insurance.  Checked [employer] formulary.

## 2022-03-30 NOTE — Telephone Encounter (Signed)
Tried to return patient's call. No answer. LM with CB#.

## 2022-03-30 NOTE — Telephone Encounter (Signed)
[employer] has generic Symbicort or brand as tier 3 for 2023 but in 2024 looks like only generic Symbicort will be tier 3. Patient would like to stick with Symbicort if possible. Called her pharmacy and they will reverse the generic and try brand Symbicort.  Patient also requested albuterol HFA inhaler. She would prefer either "the red inhaler", brand Ventolin or brand Proventil. Checked formulary and either Generic Ventolin or brand Ventolin are preferred tier 3.  Sent Rx for Ventolin to [facility]

## 2022-03-31 ENCOUNTER — Telehealth: Payer: Self-pay | Admitting: Family Medicine

## 2022-03-31 NOTE — Telephone Encounter (Signed)
Called the patient back and she is wanting to know if she should get the RSV vaccine, She is concerned since she has Asthma.

## 2022-03-31 NOTE — Telephone Encounter (Signed)
Patient informed of provider response

## 2022-03-31 NOTE — Telephone Encounter (Signed)
LM on VM for patient regarding inhalers and cost.

## 2022-03-31 NOTE — Telephone Encounter (Signed)
Patient would like a call back to discuss a vaccine.

## 2022-03-31 NOTE — Telephone Encounter (Signed)
Spoke with Verdis Frederickson at [facility]. They were able to fill Ventolin and symbicort brand. Cost is $10.35 each. They need to order Symbicort but should arrive this afternoon. [facility] will contact patient when ready.

## 2022-05-11 DIAGNOSIS — H43813 Vitreous degeneration, bilateral: Secondary | ICD-10-CM | POA: Diagnosis not present

## 2022-05-18 ENCOUNTER — Encounter: Payer: Self-pay | Admitting: Oncology

## 2022-06-16 ENCOUNTER — Ambulatory Visit: Payer: Medicare Other | Admitting: Family Medicine

## 2022-07-09 ENCOUNTER — Other Ambulatory Visit: Payer: Self-pay | Admitting: Family Medicine

## 2022-07-21 ENCOUNTER — Ambulatory Visit (INDEPENDENT_AMBULATORY_CARE_PROVIDER_SITE_OTHER): Payer: Medicare Other | Admitting: Family Medicine

## 2022-07-21 ENCOUNTER — Encounter: Payer: Self-pay | Admitting: Family Medicine

## 2022-07-21 VITALS — BP 128/76 | HR 87 | Temp 98.7°F | Ht 62.0 in | Wt 260.1 lb

## 2022-07-21 DIAGNOSIS — J4540 Moderate persistent asthma, uncomplicated: Secondary | ICD-10-CM | POA: Diagnosis not present

## 2022-07-21 DIAGNOSIS — Z6841 Body Mass Index (BMI) 40.0 and over, adult: Secondary | ICD-10-CM | POA: Diagnosis not present

## 2022-07-21 MED ORDER — SERTRALINE HCL 50 MG PO TABS: 50.0000 mg | ORAL_TABLET | Freq: Every day | ORAL | 3 refills | Status: DC

## 2022-07-21 MED ORDER — TIRZEPATIDE-WEIGHT MANAGEMENT 7.5 MG/0.5ML ~~LOC~~ SOAJ: 7.5000 mg | SUBCUTANEOUS | 0 refills | Status: DC

## 2022-07-21 MED ORDER — BUDESONIDE-FORMOTEROL FUMARATE 160-4.5 MCG/ACT IN AERO: 2.0000 | INHALATION_SPRAY | Freq: Two times a day (BID) | RESPIRATORY_TRACT | 2 refills | Status: DC

## 2022-07-21 MED ORDER — TRIAMTERENE-HCTZ 75-50 MG PO TABS: 1.0000 | ORAL_TABLET | Freq: Every day | ORAL | 3 refills | Status: DC

## 2022-07-21 MED ORDER — PANTOPRAZOLE SODIUM 40 MG PO TBEC: DELAYED_RELEASE_TABLET | ORAL | 2 refills | Status: DC

## 2022-07-21 MED ORDER — TIRZEPATIDE-WEIGHT MANAGEMENT 2.5 MG/0.5ML ~~LOC~~ SOAJ: 2.5000 mg | SUBCUTANEOUS | 0 refills | Status: DC

## 2022-07-21 MED ORDER — TIRZEPATIDE-WEIGHT MANAGEMENT 5 MG/0.5ML ~~LOC~~ SOAJ: 5.0000 mg | SUBCUTANEOUS | 0 refills | Status: DC

## 2022-07-21 NOTE — Patient Instructions (Signed)
Keep the diet clean and stay active.  Aim to do some physical exertion for 150 minutes per week. This is typically divided into 5 days per week, 30 minutes per day. The activity should be enough to get your heart rate up. Anything is better than nothing if you have time constraints.  Let me know if there are cost/stock issues. Wait 2 weeks before starting the injection.   Let us know if you need anything.

## 2022-07-21 NOTE — Progress Notes (Signed)
Chief Complaint  Patient presents with   Follow-up    Subjective: Patient is a 62 y.o. female here for weight concerns.  Tried phentermine and did not notice a big change in her appetite.  Her diet is overall healthy.  She does cardio and weight resistance exercise routinely.  She does not follow with a supervised weight loss program.  She does not have diabetes.  She is interested in an injectable medication.  She spoke with her insurance company and was told that stepdown would be $99 per month which she is willing to spend.  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  The Custom gene panel offered by [facility] includes sequencing and rearrangement analysis for the following 24 genes:  ATM, BAP1, BARD1, BRCA1, BRCA2, BRIP1, CDH1, CDK4, CDKN2A, CHEK2, EPCAM, FANCC, MITF, MLH1, MSH2, MSH6, NBN, PALB2, PMS2, PTEN, RAD51C, RAD51D, TP53, and XRCC2.   The report date is February 13, 2016.   GERD (gastroesophageal reflux disease)    Heart murmur    History of melanoma excision 03/23/2015   Hypertension    Leukopenia due to antineoplastic chemotherapy 04/16/2015   Melanoma dx'd 2003   right leg   Morbid obesity with BMI of 50.0-59.9, adult 03/23/2015   OPEN WOUND OF KNEE LEG AND ANKLE COMPLICATED 03/10/2009   Qualifier: Diagnosis of  By: Lennox Laity RN, Denise     ovarian ca dx'd 10/216   Seasonal allergies    Umbilical hernia without obstruction and without gangrene 03/23/2015   Urinary, incontinence, stress female 10/27/2016    Objective: BP 128/76 (BP Location: Left Arm, Cuff  Size: Large)   Pulse 87   Temp 98.7 F (37.1 C) (Oral)   Ht 5\' 2"  (1.575 m)   Wt 260 lb 2 oz (118 kg)   LMP  (LMP Unknown)   SpO2 97%   BMI 47.58 kg/m  General: Awake, appears stated age Heart: RRR Lungs: CTAB, no rales, wheezes or rhonchi. No accessory muscle use Psych: Age appropriate judgment and insight, normal affect and mood  Assessment and Plan: Morbid obesity with BMI of 45.0-49.9, adult - Plan: tirzepatide (ZEPBOUND) 2.5 MG/0.5ML Pen, tirzepatide (ZEPBOUND) 5 MG/0.5ML Pen, tirzepatide (ZEPBOUND) 7.5 MG/0.5ML Pen  Moderate persistent asthma without complication - Plan: budesonide-formoterol (SYMBICORT) 160-4.5 MCG/ACT inhaler  Chronic, uncontrolled.  Start Ozempic 2.5 mg weekly and uptitrate every 4 weeks.  Counseled on diet and exercise.  Recommended she wait 2 weeks before starting the medication to ensure smooth transitions to the higher doses given the supply issues we have been experiencing.  Follow-up in 2 months to recheck. The patient voiced understanding and agreement to the plan.  Jilda Roche [place], DO 07/21/22  3:06 PM

## 2022-07-28 ENCOUNTER — Telehealth: Payer: Self-pay | Admitting: Family Medicine

## 2022-07-28 NOTE — Telephone Encounter (Signed)
Patient called requesting a call back from [name] to discuss her weight loss medication. Please advice.

## 2022-07-28 NOTE — Telephone Encounter (Signed)
Called the patient back and she has not gotten the Zepbound yet, is on back order at her pharmacy. They will be getting it soon she said, but she did say it will need a PA.  I told her to let the pharmacy know to have the pharmacy initiate a PA for this medication through covermymeds or they can fax over a request. She verbazlized understanding.

## 2022-07-30 ENCOUNTER — Other Ambulatory Visit: Payer: Self-pay | Admitting: Family Medicine

## 2022-07-30 MED ORDER — PHENTERMINE HCL 37.5 MG PO CAPS: 37.5000 mg | ORAL_CAPSULE | ORAL | 0 refills | Status: DC

## 2022-07-30 NOTE — Telephone Encounter (Signed)
Spoke to the patient informed  She is getting ready to go on vacation and wanted to know if PCP could just send in [place]

## 2022-07-30 NOTE — Telephone Encounter (Signed)
Patient informed. 

## 2022-07-30 NOTE — Telephone Encounter (Signed)
Called the patient left message to call back 

## 2022-07-30 NOTE — Telephone Encounter (Signed)
Received fax from pharmacy medication is not covered. Per PCP to call patient to have her check with her insurance to see what medication is covered. Called an left a message

## 2022-08-11 ENCOUNTER — Ambulatory Visit: Payer: Medicare Other | Admitting: Obstetrics & Gynecology

## 2022-08-17 ENCOUNTER — Telehealth: Payer: Self-pay | Admitting: Family Medicine

## 2022-08-17 NOTE — Telephone Encounter (Signed)
The patient would like to try a weight loss injection.  She is currently taking the phentermine and said it is not working for her

## 2022-08-18 ENCOUNTER — Encounter: Payer: Self-pay | Admitting: Oncology

## 2022-08-18 NOTE — Telephone Encounter (Signed)
Called left message to call back 

## 2022-08-18 NOTE — Telephone Encounter (Signed)
Called and scheduled on 08/24/22. 

## 2022-08-24 ENCOUNTER — Ambulatory Visit: Payer: Medicare Other | Admitting: Family Medicine

## 2022-09-03 ENCOUNTER — Other Ambulatory Visit: Payer: Medicare Other | Admitting: Pharmacist

## 2022-09-03 DIAGNOSIS — I10 Essential (primary) hypertension: Secondary | ICD-10-CM

## 2022-09-03 DIAGNOSIS — J45909 Unspecified asthma, uncomplicated: Secondary | ICD-10-CM

## 2022-09-03 MED ORDER — AMLODIPINE BESYLATE 2.5 MG PO TABS: ORAL_TABLET | ORAL | 3 refills | Status: DC

## 2022-09-03 MED ORDER — DICLOFENAC SODIUM 1 % EX GEL: 2.0000 g | Freq: Four times a day (QID) | CUTANEOUS | 0 refills | Status: DC

## 2022-09-03 NOTE — Progress Notes (Signed)
Pharmacy Note  09/03/2022 Name: [name] MRN: [id number] DOB: November 12, 1960  Subjective: [name] is a 62 y.o. year old female who is a primary care patient of Sharlene Dory, DO. Clinical Pharmacist Practitioner referral was placed to assist with medication management.    Engaged with patient by telephone for follow up visit today.  Asthma -  Current therapy: Symbicort (Generic) 2 puffs twice a day and albuterol inhaler or nebs as needed for shortness of breath.  Uses albuterol about once per week.   Medications tried in past: Advair - patient reports she was "allergic" to something in Advair - it make her nauseated / caused vomiting.   Medication Management:  Patient is due to have amlodipine refilled. She also asks for refills on diclofenac gel and alprazolam. Last refill for alprazolam was for #45 tablets 06/18/2022  Patient also asked about weight loss medication specifically Zepbound and Wegovy / Ozempic.    Lab Results  Component Value Date   HGBA1C 6.1 05/12/2009       Component Value Date/Time   CHOL 140 03/16/2022 1101   TRIG 54.0 03/16/2022 1101   HDL 49.70 03/16/2022 1101   CHOLHDL 3 03/16/2022 1101   VLDL 10.8 03/16/2022 1101   LDLCALC 80 03/16/2022 1101   LDLCALC 84 01/15/2020 1358     Allergies  Allergen Reactions   Butorphanol Tartrate Other (See Comments)    Gave her the shakes for 6 weeks   Levofloxacin Palpitations    REACTION: tachycardia   Moxifloxacin Palpitations    REACTION: tachycardia but tolerated cirpofloxacin just fine   Stadol [Butorphanol] Other (See Comments)    Caused shakes for 6 weeks "shaking for weeks"   Amoxicillin Rash    REACTION: Rash with high doses. Can take lower doses like 500mg    Asa [Aspirin] Other (See Comments)    Pt stated the 81 mg caused Exacerbation of her asthma    Ciprofloxacin Hcl Nausea And Vomiting    Can take lower doses like 500mg .   Clindamycin/Lincomycin Nausea And Vomiting    Codeine Nausea And Vomiting   Effexor [Venlafaxine] Other (See Comments)    hyperactivity Panic attacks   Erythromycin Diarrhea and Nausea And Vomiting   Latex Rash   Losartan Nausea Only and Other (See Comments)    Headache, fatigue   Paroxetine Other (See Comments)    Made nerves worse and caused fatigue   Cephalosporins Other (See Comments)    REACTION: she has tolerated rocephin and Keflex without difficulty   Clarithromycin Diarrhea   Clindamycin Diarrhea   Influenza Vaccines Other (See Comments)    Pt states that she got really sick from flu vaccine was sick a total of 6 weeks   Paxil [Paroxetine Hcl] Other (See Comments)    fatigue   Tape Rash    Medications Reviewed Today     Reviewed by Henrene Pastor, RPH-CPP (Pharmacist) on 09/03/22 at 1108  Med List Status: <None>   Medication Order Taking? Sig Documenting Provider Last Dose Status Informant  albuterol (PROVENTIL) (2.5 MG/3ML) 0.083% nebulizer solution [id number] Yes Take 3 mLs (2.5 mg total) by nebulization every 6 (six) hours as needed for wheezing or shortness of breath. Sharlene Dory, DO Taking Active   albuterol (VENTOLIN HFA) 108 (90 Base) MCG/ACT inhaler [id number] Yes Inhale 1-2 puffs into the lungs every 6 (six) hours as needed for wheezing or shortness of breath. Patient prefers brand Ventolin HFA inhaler (tier 3 per her insurance) Carmelia Roller, [employer]  Renae Fickle, DO Taking Active   ALPRAZolam Prudy Feeler) 1 MG tablet [id number] Yes Take 0.5-1 tablets (0.5-1 mg total) by mouth at bedtime as needed for anxiety. Sharlene Dory, DO Taking Active   amLODipine (NORVASC) 2.5 MG tablet [id number] Yes TAKE 1 TABLET(2.5 MG) BY MOUTH IN THE MORNING AND AT BEDTIME Sharlene Dory, DO Taking Active   budesonide-formoterol [facility]) 160-4.5 MCG/ACT inhaler [id number] Yes Inhale 2 puffs into the lungs 2 (two) times daily. Sharlene Dory, DO Taking Active   diclofenac Sodium (VOLTAREN) 1 % GEL [id number] Yes  Apply 2 g topically 4 (four) times daily. Sharlene Dory, DO Taking Active   ECHINACEA PO [id number] Yes Take 1 tablet by mouth daily. [provider] Taking Active   ELDERBERRY PO [id number] Yes Take 1,250 mg by mouth daily. [provider] Taking Active   Lactobacillus (ACIDOPHILUS PO) [id number] Yes Take 2 capsules by mouth daily.  [provider] Taking Active Self  Nebulizers MISC [id number]  1 Units by Misc.(Non-Drug; Combo Route) route every 8 (eight) hours as needed. [provider]  Active   pantoprazole (PROTONIX) 40 MG tablet [id number] Yes TAKE 1 TABLET(40 MG) BY MOUTH DAILY Carmelia Roller Jilda Roche, DO Taking Active   phentermine 37.5 MG capsule [id number] No Take 1 capsule (37.5 mg total) by mouth every morning.  Patient not taking: Reported on 09/03/2022   Sharlene Dory, DO Not Taking Active   sertraline (ZOLOFT) 50 MG tablet [id number] Yes Take 1 tablet (50 mg total) by mouth daily. Sharlene Dory, DO Taking Active   torsemide (DEMADEX) 20 MG tablet [id number] No TAKE 1 TABLET(20 MG) BY MOUTH DAILY  Patient not taking: Reported on 09/03/2022   Sharlene Dory, DO Not Taking Active   triamterene-hydrochlorothiazide (MAXZIDE) 75-50 MG tablet [id number] Yes Take 1 tablet by mouth daily. Reported on 06/12/2015 Sharlene Dory, DO Taking Active   VITAMIN E PO [id number] Yes Take by mouth daily. [provider] Taking Active             Patient Active Problem List   Diagnosis Date Noted   Moderate persistent asthma without complication 08/13/2020   Chronic diastolic (congestive) heart failure (HCC) 08/13/2020   Thrombocytopenia, unspecified (HCC) 08/13/2020   History of DVT (deep vein thrombosis) 06/12/2019   Cough 11/30/2018   GAD (generalized anxiety disorder) 11/10/2018   Urinary, incontinence, stress female 10/27/2016   Genetic testing 02/17/2016   Morbid obesity with BMI of 45.0-49.9, adult (HCC)  10/21/2015   Port catheter in place 07/30/2015   Abscess of groin, right 06/19/2015   Noncompliance with therapeutic plan 06/15/2015   Chemotherapy induced neutropenia (HCC) 05/17/2015   Portacath in place 05/11/2015   Chemotherapy induced thrombocytopenia 05/11/2015   Hypokalemia 04/25/2015   Antineoplastic chemotherapy induced pancytopenia (HCC) 04/25/2015   Fever 04/25/2015   Rash 04/25/2015   Diarrhea 04/25/2015   Anemia due to antineoplastic chemotherapy 04/16/2015   Folliculitis 04/16/2015   Leukopenia due to antineoplastic chemotherapy (HCC) 04/16/2015   Morbid obesity with BMI of 50.0-59.9, adult (HCC) 03/23/2015   History of melanoma excision 03/23/2015   Asthma exacerbation attacks 03/23/2015   Crohn's disease without complication (HCC) 03/23/2015   Umbilical hernia without obstruction and without gangrene 03/23/2015   Essential hypertension 03/23/2015   Ovarian CA, left (HCC) 02/11/2015   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   CANDIDIASIS OF VULVA AND VAGINA 04/10/2009   CONTACT DERMATITIS&OTHER ECZEMA DUE UNSPEC CAUSE 04/10/2009   CELLULITIS AND ABSCESS OF LEG EXCEPT FOOT 03/24/2009  EDEMA 03/24/2009   OPEN WOUND OF KNEE LEG AND ANKLE COMPLICATED 03/10/2009   RECTAL BLEEDING 10/15/2008   MORBID OBESITY 06/01/2007   Anxiety state 06/01/2007   HYPERTENSION 06/01/2007   Asthma 06/01/2007   GASTROESOPHAGEAL REFLUX DISEASE 06/01/2007   CROHN'S DISEASE, SMALL INTESTINE 06/01/2007   Arthritis 06/01/2007   MALIGNANT MELANOMA, HX OF 06/01/2007   Skin cancer 04/12/2001     Medication Assistance:  Patient has Medicare Extra Help / LIS for 2024.    Assessment / Plan: Asthma - controlled Continue generic Symbicort 2 puff twice a day  Use albuterol inhaler or nebs as needed.   Medication Management:  Sent refill request to PCP for alprazolam.  Updated Rs for amlodipine and diclofenac gel - patient is aware insurance may not cover diclofeac gel since is available  over-the-counter>   Discussed that currently Medicare does not cover ANY weight loss medications. There is the possibility that [facility] will be available to Medicare patients in the future but will be for secondary CVD risk reduction in patients that already have diagnosis of CVD.   Meds ordered this encounter  Medications   amLODipine (NORVASC) 2.5 MG tablet    Sig: TAKE 1 TABLET(2.5 MG) BY MOUTH IN THE MORNING AND AT BEDTIME    Dispense:  180 tablet    Refill:  3   diclofenac Sodium (VOLTAREN) 1 % GEL    Sig: Apply 2 g topically 4 (four) times daily.    Dispense:  100 g    Refill:  0    May not be covered by insurance.     Follow Up:  3 months   Henrene Pastor, PharmD Clinical Pharmacist [facility]  - [facility] [phone]

## 2022-09-10 DIAGNOSIS — Z1231 Encounter for screening mammogram for malignant neoplasm of breast: Secondary | ICD-10-CM | POA: Diagnosis not present

## 2022-09-10 DIAGNOSIS — R92323 Mammographic fibroglandular density, bilateral breasts: Secondary | ICD-10-CM | POA: Diagnosis not present

## 2022-09-10 LAB — HM MAMMOGRAPHY

## 2022-09-14 ENCOUNTER — Encounter: Payer: Self-pay | Admitting: Family Medicine

## 2022-09-14 NOTE — Progress Notes (Unsigned)
Follow Up Note: Gyn-Onc  [name] 62 y.o. female  CC: She presents for an annual exam   HPI: The oncology history was reviewed.  Interval History: She denies abdominal distention, pain, weight loss or change in her bowel habits.   Wears incontinence undergarments at night.  Not sexually active--partner w/ED.  No domestic violence.  MMG in 4/23 was neg.     Review of Systems  Review of Systems  Constitutional:  Negative for malaise/fatigue and weight loss.  Respiratory:  Negative for shortness of breath and wheezing.   Cardiovascular:  Negative for chest pain and leg swelling.  Gastrointestinal:  Negative for abdominal pain, blood in stool, constipation, nausea and vomiting.  Genitourinary:  Negative for dysuria, frequency, hematuria and urgency.  Musculoskeletal:  Negative for joint pain and myalgias.  Neurological:  Negative for weakness.  Psychiatric/Behavioral:  Negative for depression. The patient does not have insomnia.    Current medications, allergy, social history, past surgical history, past medical history, family history were all reviewed.    Vitals:  There were no vitals taken for this visit.  Physical Exam:  Physical Exam Exam conducted with a chaperone present.  Constitutional:      General: She is not in acute distress. Breast: No masses, NT, no discharge Cardiovascular:     Rate and Rhythm: Normal rate and regular rhythm.  Pulmonary:     Effort: Pulmonary effort is normal.     Breath sounds: Normal breath sounds. No wheezing or rhonchi.  Abdominal:     Palpations: Abdomen is soft.     Tenderness: There is no abdominal tenderness. There is no right CVA tenderness or left CVA tenderness.     Hernia: No hernia is present.  Genitourinary:    General: Normal vulva.     Urethra: No urethral lesion.     Vagina: No lesions. No bleeding Musculoskeletal:     Cervical back: Neck supple.     Right lower leg: No edema.     Left lower leg: No edema.   Lymphadenopathy:     Upper Body:     Right upper body: No supraclavicular adenopathy.     Left upper body: No supraclavicular adenopathy.     Lower Body: No right inguinal adenopathy. No left inguinal adenopathy.  Skin:    Findings: No rash.  Neurological:     Mental Status: She is oriented to person, place, and time.   Assessment/Plan:  Problem List Items Addressed This Visit       Oncology   Ovarian CA, left (HCC) - Primary    Unremarkable exam  >healthy lifestyle practices reviewed >education materials provided re: urinary incontinence, Kegel exercises >return prn or in 1 yr   Antionette Char, MD

## 2022-09-15 ENCOUNTER — Encounter: Payer: Self-pay | Admitting: Obstetrics & Gynecology

## 2022-09-15 ENCOUNTER — Other Ambulatory Visit: Payer: Self-pay

## 2022-09-15 ENCOUNTER — Inpatient Hospital Stay: Payer: Medicare Other | Attending: Obstetrics & Gynecology | Admitting: Obstetrics & Gynecology

## 2022-09-15 VITALS — BP 132/77 | HR 87 | Temp 98.3°F | Resp 16 | Ht 63.78 in | Wt 258.8 lb

## 2022-09-15 DIAGNOSIS — Z8543 Personal history of malignant neoplasm of ovary: Secondary | ICD-10-CM | POA: Diagnosis not present

## 2022-09-15 DIAGNOSIS — C562 Malignant neoplasm of left ovary: Secondary | ICD-10-CM

## 2022-09-15 NOTE — Patient Instructions (Signed)
Return in 1 year ?

## 2022-09-22 ENCOUNTER — Other Ambulatory Visit: Payer: Self-pay | Admitting: Family Medicine

## 2022-09-22 ENCOUNTER — Telehealth: Payer: Self-pay | Admitting: Family Medicine

## 2022-09-22 ENCOUNTER — Encounter: Payer: Self-pay | Admitting: Family Medicine

## 2022-09-22 ENCOUNTER — Ambulatory Visit (INDEPENDENT_AMBULATORY_CARE_PROVIDER_SITE_OTHER): Payer: Medicare Other | Admitting: Family Medicine

## 2022-09-22 VITALS — BP 130/86 | HR 87 | Temp 99.3°F | Ht 64.0 in | Wt 254.2 lb

## 2022-09-22 DIAGNOSIS — J4541 Moderate persistent asthma with (acute) exacerbation: Secondary | ICD-10-CM | POA: Diagnosis not present

## 2022-09-22 DIAGNOSIS — E871 Hypo-osmolality and hyponatremia: Secondary | ICD-10-CM

## 2022-09-22 DIAGNOSIS — I10 Essential (primary) hypertension: Secondary | ICD-10-CM | POA: Diagnosis not present

## 2022-09-22 LAB — LIPID PANEL
Cholesterol: 110 mg/dL (ref 0–200)
HDL: 40.7 mg/dL (ref >39.00)
LDL Cholesterol: 58 mg/dL (ref 0–99)
NonHDL: 68.82
Total CHOL/HDL Ratio: 3
Triglycerides: 55 mg/dL (ref 0.0–149.0)
VLDL: 11 mg/dL (ref 0.0–40.0)

## 2022-09-22 LAB — COMPREHENSIVE METABOLIC PANEL
ALT: 16 U/L (ref 0–35)
AST: 28 U/L (ref 0–37)
Albumin: 3.2 g/dL — ABNORMAL LOW (ref 3.5–5.2)
Alkaline Phosphatase: 86 U/L (ref 39–117)
BUN: 24 mg/dL — ABNORMAL HIGH (ref 6–23)
CO2: 25 mEq/L (ref 19–32)
Calcium: 8.6 mg/dL (ref 8.4–10.5)
Chloride: 99 mEq/L (ref 96–112)
Creatinine, Ser: 0.89 mg/dL (ref 0.40–1.20)
GFR: 69.6 mL/min (ref >60.00)
Glucose, Bld: 92 mg/dL (ref 70–99)
Potassium: 3.9 mEq/L (ref 3.5–5.1)
Sodium: 131 mEq/L — ABNORMAL LOW (ref 135–145)
Total Bilirubin: 0.5 mg/dL (ref 0.2–1.2)
Total Protein: 9 g/dL — ABNORMAL HIGH (ref 6.0–8.3)

## 2022-09-22 MED ORDER — METHYLPREDNISOLONE 4 MG PO TBPK
ORAL_TABLET | ORAL | 0 refills | Status: DC
Start: 2022-09-22 — End: 2022-09-29

## 2022-09-22 MED ORDER — DOXYCYCLINE HYCLATE 100 MG PO TABS
100.0000 mg | ORAL_TABLET | Freq: Two times a day (BID) | ORAL | 0 refills | Status: AC
Start: 2022-09-24 — End: 2022-10-01

## 2022-09-22 MED ORDER — NALTREXONE HCL 50 MG PO TABS
25.0000 mg | ORAL_TABLET | Freq: Every day | ORAL | 1 refills | Status: DC
Start: 2022-09-22 — End: 2022-12-03

## 2022-09-22 MED ORDER — ALPRAZOLAM 1 MG PO TABS: 0.5000 mg | ORAL_TABLET | Freq: Every evening | ORAL | 1 refills | Status: DC | PRN

## 2022-09-22 MED ORDER — FLUCONAZOLE 150 MG PO TABS
ORAL_TABLET | ORAL | 0 refills | Status: DC
Start: 2022-09-22 — End: 2022-12-03

## 2022-09-22 MED ORDER — METHYLPREDNISOLONE ACETATE 80 MG/ML IJ SUSP
80.0000 mg | Freq: Once | INTRAMUSCULAR | Status: AC
Start: 2022-09-22 — End: 2022-09-22
  Administered 2022-09-22: 80 mg via INTRAMUSCULAR

## 2022-09-22 MED ORDER — BUPROPION HCL ER (XL) 150 MG PO TB24
150.0000 mg | ORAL_TABLET | Freq: Every day | ORAL | 1 refills | Status: DC
Start: 2022-09-22 — End: 2023-02-25

## 2022-09-22 NOTE — Telephone Encounter (Signed)
Spoke to the patient informed of PCP instructions She prefers to stay on the medication she just needed confirmation from PCP

## 2022-09-22 NOTE — Progress Notes (Signed)
Chief Complaint  Patient presents with   Follow-up    Bronchitis Discuss weight loss medication Cough Refill Alprazolam    Subjective [name] is a 62 y.o. female who presents for hypertension follow up. She does not monitor home blood pressures. She is compliant with medications. Patient has these side effects of medication: none She is sometimes adhering to a healthy diet overall. Current exercise: elliptical machine No CP or SOB out of ordinary (asthma is flaring).   URI Duration: 1 week Associated symptoms: wheezing, shortness of breath, and coughing Denies: sinus congestion, sinus pain, rhinorrhea, itchy watery eyes, ear pain, ear drainage, sore throat, myalgia, and fevers Treatment to date: home SABA; Symbicort Sick contacts: No   Past Medical History:  Diagnosis Date   Anemia due to antineoplastic chemotherapy 04/16/2015   Anxiety state 06/01/2007   Qualifier: Diagnosis of  By: Yetta Barre RN, CGRN, Sheri     Asthma    Asymptomatic proteinuria    Intermittent   CHF (congestive heart failure) (HCC)    COPD (chronic obstructive pulmonary disease) (HCC)    Crohn's disease without complication (HCC) 03/23/2015   Depression    Genetic testing 02/17/2016   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  The Custom gene panel offered by [facility] includes sequencing and rearrangement analysis for the following 24 genes:  ATM, BAP1, BARD1, BRCA1, BRCA2, BRIP1, CDH1, CDK4, CDKN2A, CHEK2, EPCAM, FANCC, MITF, MLH1, MSH2, MSH6, NBN, PALB2, PMS2, PTEN, RAD51C, RAD51D, TP53, and XRCC2.   The report date is February 13, 2016.   GERD (gastroesophageal reflux disease)    Heart murmur    History of melanoma excision 03/23/2015   Hypertension    Leukopenia due to antineoplastic chemotherapy (HCC) 04/16/2015   Melanoma (HCC) dx'd 2003   right leg   Morbid obesity with BMI of 50.0-59.9, adult (HCC) 03/23/2015   OPEN WOUND OF KNEE LEG AND ANKLE COMPLICATED 03/10/2009   Qualifier:  Diagnosis of  By: Lennox Laity RN, Denise     ovarian ca dx'd 10/216   Seasonal allergies    Umbilical hernia without obstruction and without gangrene 03/23/2015   Urinary, incontinence, stress female 10/27/2016    Exam BP 130/86 (BP Location: Left Arm, Patient Position: Sitting, Cuff Size: Large)   Pulse 87   Temp 99.3 F (37.4 C) (Oral)   Ht 5\' 4"  (1.626 m)   Wt 254 lb 4 oz (115.3 kg)   LMP  (LMP Unknown)   SpO2 95%   BMI 43.64 kg/m  General:  well developed, well nourished, in no apparent distress HEENT: No external ear lesions, canals are patent without otorrhea, TMs negative bilaterally, no sinus TTP bilaterally, nares are patent without rhinorrhea, MMM, no pharyngeal exudate or erythema Heart: RRR, no bruits, no LE edema Lungs: Diffuse expiratory wheezes, no accessory muscle use Psych: well oriented with normal range of affect and appropriate judgment/insight  Essential hypertension - Plan: Comprehensive metabolic panel, Lipid panel  Moderate persistent asthma with acute exacerbation - Plan: methylPREDNISolone (MEDROL DOSEPAK) 4 MG TBPK tablet, doxycycline (VIBRA-TABS) 100 MG tablet, methylPREDNISolone acetate (DEPO-MEDROL) injection 80 mg  MORBID OBESITY - Plan: buPROPion (WELLBUTRIN XL) 150 MG 24 hr tablet, naltrexone (DEPADE) 50 MG tablet  Chronic, stable.  Continue Maxide 75-50 mg daily, amlodipine 2.5 mg daily.  Counseled on diet and exercise. Exacerbation of chronic issue.  Continue Symbicort.  Depo-Medrol injection today.  Start Medrol Dosepak tomorrow as she tolerates this better than prednisone.  If no improvement in 2 days,  she will take 7 days of doxycycline. Counseled on diet and exercise.  Insurance does not cover injectables.  Start naltrexone 25 mg daily, Wellbutrin 150 mg daily. F/u in 6 months or as needed. The patient voiced understanding and agreement to the plan.  Jilda Roche [place], DO 09/22/22  11:38 AM

## 2022-09-22 NOTE — Patient Instructions (Addendum)
Continue to push fluids, practice good hand hygiene, and cover your mouth if you cough.  If you start having fevers, shaking or shortness of breath, seek immediate care.  OK to take Tylenol 1000 mg (2 extra strength tabs) or 975 mg (3 regular strength tabs) every 6 hours as needed.  Wait 2 days before taking the antibiotic. Only take if getting worse.   Keep the diet clean and stay active.  Let us know if you need anything.

## 2022-09-22 NOTE — Telephone Encounter (Signed)
The patient is concerned about the 2 new weight control meds sent in today. She thought Wellbutrin was for depression and the naltrexone (was for getting off alcohol). She would just like a better understanding of how they will work to help her with weight loss

## 2022-09-29 ENCOUNTER — Telehealth: Payer: Self-pay

## 2022-09-29 ENCOUNTER — Other Ambulatory Visit (INDEPENDENT_AMBULATORY_CARE_PROVIDER_SITE_OTHER): Payer: Medicare Other

## 2022-09-29 DIAGNOSIS — E871 Hypo-osmolality and hyponatremia: Secondary | ICD-10-CM | POA: Diagnosis not present

## 2022-09-29 LAB — BASIC METABOLIC PANEL
BUN: 24 mg/dL — ABNORMAL HIGH (ref 6–23)
CO2: 24 mEq/L (ref 19–32)
Calcium: 9 mg/dL (ref 8.4–10.5)
Chloride: 101 mEq/L (ref 96–112)
Creatinine, Ser: 0.8 mg/dL (ref 0.40–1.20)
GFR: 79.09 mL/min (ref >60.00)
Glucose, Bld: 100 mg/dL — ABNORMAL HIGH (ref 70–99)
Potassium: 4.3 mEq/L (ref 3.5–5.1)
Sodium: 135 mEq/L (ref 135–145)

## 2022-09-29 MED ORDER — METHYLPREDNISOLONE 4 MG PO TBPK
ORAL_TABLET | ORAL | 0 refills | Status: DC
Start: 2022-09-29 — End: 2022-12-03

## 2022-09-29 NOTE — Telephone Encounter (Signed)
She has been taking mucinex already since the beginning of being sick. Also she cannot schedule another appointment next week. She is requesting more prednisone sent in because she said her asthma has gotten worse since sick and not improving completely

## 2022-09-29 NOTE — Telephone Encounter (Signed)
Erroneous encounter. Patient spoke with Zella Ball while in office.

## 2022-09-29 NOTE — Telephone Encounter (Signed)
Have her take Mucinex to break up the congestion, could be lingering inflammation. Schedule her Tuesday for a recheck, cancel if she is doing better. Ty.

## 2022-09-29 NOTE — Telephone Encounter (Signed)
Sched an E visit. Sent more prednisone. If things don't improve, may have to seek more immediate care. Ty.

## 2022-09-29 NOTE — Telephone Encounter (Signed)
Patient completed both prescriptions sent in last week for her congestion and cough. She feels she still needs something sent in as the cough has gotten some better, but still feeling congestion in her chest. .

## 2022-09-29 NOTE — Telephone Encounter (Signed)
Called and informed of PCP instructions. Verbalized understanding

## 2022-10-01 ENCOUNTER — Telehealth: Payer: Self-pay | Admitting: Family Medicine

## 2022-10-01 DIAGNOSIS — J4540 Moderate persistent asthma, uncomplicated: Secondary | ICD-10-CM

## 2022-10-01 MED ORDER — BUDESONIDE-FORMOTEROL FUMARATE 160-4.5 MCG/ACT IN AERO
2.0000 | INHALATION_SPRAY | Freq: Two times a day (BID) | RESPIRATORY_TRACT | 2 refills | Status: DC
Start: 2022-10-01 — End: 2022-11-26

## 2022-10-01 NOTE — Telephone Encounter (Signed)
Pt states this usually goes through [facility].   Medication: budesonide-formoterol (SYMBICORT) 160-4.5 MCG/ACT inhaler   Has the patient contacted their pharmacy? No.   Preferred Pharmacy:   [id number] - [place], [place] - [address] AT . [address] [place], [place] [address] Phone: [phone]  Fax: [phone]

## 2022-10-01 NOTE — Telephone Encounter (Signed)
Rx sent 

## 2022-10-12 ENCOUNTER — Ambulatory Visit (INDEPENDENT_AMBULATORY_CARE_PROVIDER_SITE_OTHER): Payer: Medicare Other | Admitting: Family Medicine

## 2022-10-12 ENCOUNTER — Telehealth: Payer: Self-pay | Admitting: Family Medicine

## 2022-10-12 ENCOUNTER — Encounter: Payer: Self-pay | Admitting: Family Medicine

## 2022-10-12 VITALS — BP 123/82 | HR 65 | Temp 98.3°F | Ht 62.0 in | Wt 254.1 lb

## 2022-10-12 DIAGNOSIS — Z6841 Body Mass Index (BMI) 40.0 and over, adult: Secondary | ICD-10-CM

## 2022-10-12 MED ORDER — SEMAGLUTIDE-WEIGHT MANAGEMENT 1.7 MG/0.75ML ~~LOC~~ SOAJ
1.7000 mg | SUBCUTANEOUS | 0 refills | Status: DC
Start: 2023-01-07 — End: 2022-10-12

## 2022-10-12 MED ORDER — SEMAGLUTIDE-WEIGHT MANAGEMENT 2.4 MG/0.75ML ~~LOC~~ SOAJ
2.4000 mg | SUBCUTANEOUS | 0 refills | Status: DC
Start: 2023-02-05 — End: 2023-01-21

## 2022-10-12 MED ORDER — SEMAGLUTIDE-WEIGHT MANAGEMENT 1 MG/0.5ML ~~LOC~~ SOAJ
1.0000 mg | SUBCUTANEOUS | 0 refills | Status: DC
Start: 2022-12-09 — End: 2022-10-12

## 2022-10-12 MED ORDER — SEMAGLUTIDE-WEIGHT MANAGEMENT 0.5 MG/0.5ML ~~LOC~~ SOAJ
0.5000 mg | SUBCUTANEOUS | 0 refills | Status: DC
Start: 2022-11-10 — End: 2022-10-12

## 2022-10-12 MED ORDER — SEMAGLUTIDE-WEIGHT MANAGEMENT 1.7 MG/0.75ML ~~LOC~~ SOAJ
1.7000 mg | SUBCUTANEOUS | 0 refills | Status: DC
Start: 2023-01-07 — End: 2023-01-21

## 2022-10-12 MED ORDER — SEMAGLUTIDE-WEIGHT MANAGEMENT 0.25 MG/0.5ML ~~LOC~~ SOAJ
0.2500 mg | SUBCUTANEOUS | 0 refills | Status: AC
Start: 2022-10-12 — End: 2022-11-09

## 2022-10-12 MED ORDER — SEMAGLUTIDE-WEIGHT MANAGEMENT 2.4 MG/0.75ML ~~LOC~~ SOAJ
2.4000 mg | SUBCUTANEOUS | 0 refills | Status: DC
Start: 2023-02-05 — End: 2022-10-12

## 2022-10-12 MED ORDER — SEMAGLUTIDE-WEIGHT MANAGEMENT 0.25 MG/0.5ML ~~LOC~~ SOAJ
0.2500 mg | SUBCUTANEOUS | 0 refills | Status: DC
Start: 2022-10-12 — End: 2022-10-12

## 2022-10-12 MED ORDER — SEMAGLUTIDE-WEIGHT MANAGEMENT 1 MG/0.5ML ~~LOC~~ SOAJ
1.0000 mg | SUBCUTANEOUS | 0 refills | Status: AC
Start: 2022-12-09 — End: 2023-01-06

## 2022-10-12 MED ORDER — SEMAGLUTIDE-WEIGHT MANAGEMENT 0.5 MG/0.5ML ~~LOC~~ SOAJ
0.5000 mg | SUBCUTANEOUS | 0 refills | Status: AC
Start: 2022-11-10 — End: 2022-12-08

## 2022-10-12 NOTE — Patient Instructions (Addendum)
Let me know if there are any issues.   They are not always FDA approved, but we can see how things go.   Keep the diet clean and stay active.  Let us know if you need anything.

## 2022-10-12 NOTE — Telephone Encounter (Signed)
sent 

## 2022-10-12 NOTE — Telephone Encounter (Signed)
Need clarification on what they are asking for and how to be written

## 2022-10-12 NOTE — Progress Notes (Signed)
Chief Complaint  Patient presents with   Follow-up    Bronchitis     Subjective: Patient is a 62 y.o. female here for obesity.  Pt has had trouble losing wt. Ins does not cover most options. Has never been on wt loss meds before. Diet is fair, she stays very active. No CP or SOB. Has been with [employer] without success.   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  The Custom gene panel offered by [facility] includes sequencing and rearrangement analysis for the following 24 genes:  ATM, BAP1, BARD1, BRCA1, BRCA2, BRIP1, CDH1, CDK4, CDKN2A, CHEK2, EPCAM, FANCC, MITF, MLH1, MSH2, MSH6, NBN, PALB2, PMS2, PTEN, RAD51C, RAD51D, TP53, and XRCC2.   The report date is February 13, 2016.   GERD (gastroesophageal reflux disease)    Heart murmur    History of melanoma excision 03/23/2015   Hypertension    Leukopenia due to antineoplastic chemotherapy (HCC) 04/16/2015   Melanoma (HCC) dx'd 2003   right leg   Morbid obesity with BMI of 50.0-59.9, adult (HCC) 03/23/2015   OPEN WOUND OF KNEE LEG AND ANKLE COMPLICATED 03/10/2009   Qualifier: Diagnosis of  By: Lennox Laity RN, Denise     ovarian ca dx'd 10/216   Seasonal allergies    Umbilical hernia without obstruction and without gangrene 03/23/2015   Urinary, incontinence, stress female 10/27/2016    Objective: BP 123/82 (BP Location: Left Arm, Patient Position: Sitting, Cuff Size: Large)   Pulse 65   Temp 98.3 F (36.8 C) (Oral)   Ht 5\' 2"  (1.575 m)   Wt 254 lb 2 oz (115.3 kg)   LMP  (LMP Unknown)   SpO2 98%    BMI 46.48 kg/m  General: Awake, appears stated age Heart: RRR, 2+ pitting b/l LE edema tapering at the prox tibia Lungs: CTAB, no rales, wheezes or rhonchi. No accessory muscle use Psych: Age appropriate judgment and insight, normal affect and mood  Assessment and Plan: Morbid obesity with BMI of 45.0-49.9, adult (HCC)  Chronic, unstable. Start [employer] as above. Counseled on diet/exercise. Warned about cost issues.  F/u in 6 weeks.  The patient voiced understanding and agreement to the plan.  Jilda Roche [place], DO 10/12/22  10:46 AM

## 2022-10-12 NOTE — Telephone Encounter (Signed)
Sarah from the pharmacy states they are a compounding pharmacy and they need the rx written a certain way. Stated the title has to be a specific formula, 2.5 Semaglutide, pyridoxine over ml. She stated it has to be the full amount because they dispense it all in one bottle and the pt draws her dosage based in the directions. She stated if we have any questions to call the pharmacy and ask for her.    [facility] - [place], [place] - [address] Jerlyn Ly [address] Laurell Josephs [place] [address] Phone: [phone]  Fax: [phone]

## 2022-10-13 NOTE — Addendum Note (Signed)
Addended by: Radene Gunning on: 10/13/2022 05:07 PM   Modules accepted: Level of Service

## 2022-10-19 ENCOUNTER — Telehealth: Payer: Self-pay | Admitting: Family Medicine

## 2022-10-19 NOTE — Telephone Encounter (Signed)
Have not seen a fax yet

## 2022-10-19 NOTE — Telephone Encounter (Signed)
Pt called stating to follow up on script correction for Semaglutide. Advised pt that, per previous conversation with pharmacy on 7.8.24, they were supposed to be sending over a form to our office to get the correction sorted (correction being that they do not carry the self injectors and need the script rewritten for syringe and vial). After consulting with Zella Ball and checking the fax queue, advised we had not yet received the form to facilitate the correction and would send a note back to look into this matter.

## 2022-10-20 NOTE — Telephone Encounter (Signed)
As of this morning still have not seen a fax from pharmacy regarding this request

## 2022-10-20 NOTE — Telephone Encounter (Signed)
I do not have a good alternative for her at this time, we could set her up with a weight loss clinic if she wishes.

## 2022-10-20 NOTE — Telephone Encounter (Signed)
She is not interested  But she is going to call back to the specialty pharmacy to see if they can fax over clarification of what they have in stock.  She said instead of the injectables they offer/vials/and you have to draw it up. I informed her we would have to have a fax from that pharmacy regarding exactly what they will fill. She will call them again to request this.

## 2022-10-20 NOTE — Telephone Encounter (Signed)
Patient would like an alternative to the weight loss injections

## 2022-10-20 NOTE — Telephone Encounter (Signed)
Pt is requesting to speak to [name] about alternatives to this rx.

## 2022-11-19 ENCOUNTER — Telehealth: Payer: Self-pay | Admitting: Family Medicine

## 2022-11-19 MED ORDER — PROMETHAZINE HCL 25 MG PO TABS: 25.0000 mg | ORAL_TABLET | Freq: Four times a day (QID) | ORAL | 0 refills | Status: DC | PRN

## 2022-11-19 NOTE — Addendum Note (Signed)
Addended by: Radene Gunning on: 11/19/2022 04:36 PM   Modules accepted: Orders

## 2022-11-19 NOTE — Telephone Encounter (Signed)
Called left message to call back 

## 2022-11-19 NOTE — Telephone Encounter (Signed)
Patient informed sent in. 

## 2022-11-19 NOTE — Telephone Encounter (Signed)
Called the patient back and she states she is very nauseated. She would like to have promethazine for nausea sent in to [facility]. [place]

## 2022-11-19 NOTE — Telephone Encounter (Signed)
Pt requested call back but did not discuss what it is about.

## 2022-11-23 ENCOUNTER — Ambulatory Visit: Payer: Medicare Other | Admitting: Family Medicine

## 2022-11-26 ENCOUNTER — Other Ambulatory Visit: Payer: Self-pay | Admitting: Family Medicine

## 2022-11-26 DIAGNOSIS — J4540 Moderate persistent asthma, uncomplicated: Secondary | ICD-10-CM

## 2022-12-03 ENCOUNTER — Other Ambulatory Visit: Payer: Medicare Other | Admitting: Pharmacist

## 2022-12-03 MED ORDER — ALBUTEROL SULFATE HFA 108 (90 BASE) MCG/ACT IN AERS: 1.0000 | INHALATION_SPRAY | Freq: Four times a day (QID) | RESPIRATORY_TRACT | 2 refills | Status: DC | PRN

## 2022-12-03 NOTE — Progress Notes (Signed)
Pharmacy Note  12/03/2022 Name: [name] MRN: [id number] DOB: Dec 01, 1960  Subjective: [name] is a 62 y.o. year old female who is a primary care patient of Sharlene Dory, DO. Clinical Pharmacist Practitioner referral was placed to assist with medication management.    Engaged with patient by telephone for follow up visit today.  Asthma -  Current therapy: Symbicort (Generic) 2 puffs twice a day and albuterol inhaler or nebs as needed for shortness of breath.  Uses albuterol about once per week.  Patient reports she is in need of refills for both albuterol and Symbicort  Medications tried in past: Advair - patient reports she was "allergic" to something in Advair - it make her nauseated / caused vomiting.   Medication Management:  Patient also is due to have pantoprazole refilled - looks like she has refill remaining  Morbid Obesity: Patient was prescribed bupropion / natrexone combo - similar to Contrave but she decided not to take.  Dr Carmelia Roller also prescribed [id number] / compounded semeglutide but patient did not start. She reports she will discuss with Dr Carmelia Roller more at next appointment.    Lab Results  Component Value Date   HGBA1C 6.1 05/12/2009       Component Value Date/Time   CHOL 110 09/22/2022 1118   TRIG 55.0 09/22/2022 1118   HDL 40.70 09/22/2022 1118   CHOLHDL 3 09/22/2022 1118   VLDL 11.0 09/22/2022 1118   LDLCALC 58 09/22/2022 1118   LDLCALC 84 01/15/2020 1358     Allergies  Allergen Reactions   Butorphanol Tartrate Other (See Comments)    Gave her the shakes for 6 weeks   Levofloxacin Palpitations    REACTION: tachycardia   Moxifloxacin Palpitations    REACTION: tachycardia but tolerated cirpofloxacin just fine   Stadol [Butorphanol] Other (See Comments)    Caused shakes for 6 weeks "shaking for weeks"   Amoxicillin Rash    REACTION: Rash with high doses. Can take lower doses like 500mg    Asa [Aspirin] Other (See  Comments)    Pt stated the 81 mg caused Exacerbation of her asthma    Ciprofloxacin Hcl Nausea And Vomiting    Can take lower doses like 500mg .   Clindamycin/Lincomycin Nausea And Vomiting   Codeine Nausea And Vomiting   Effexor [Venlafaxine] Other (See Comments)    hyperactivity Panic attacks   Erythromycin Diarrhea and Nausea And Vomiting   Latex Rash   Losartan Nausea Only and Other (See Comments)    Headache, fatigue   Paroxetine Other (See Comments)    Made nerves worse and caused fatigue   Cephalosporins Other (See Comments)    REACTION: she has tolerated rocephin and Keflex without difficulty   Clarithromycin Diarrhea   Clindamycin Diarrhea   Influenza Vaccines Other (See Comments)    Pt states that she got really sick from flu vaccine was sick a total of 6 weeks   Paxil [Paroxetine Hcl] Other (See Comments)    fatigue   Tape Rash    Medications Reviewed Today   Medications were not reviewed in this encounter     Patient Active Problem List   Diagnosis Date Noted   Moderate persistent asthma without complication 08/13/2020   Chronic diastolic (congestive) heart failure (HCC) 08/13/2020   Thrombocytopenia, unspecified (HCC) 08/13/2020   History of DVT (deep vein thrombosis) 06/12/2019   Cough 11/30/2018   GAD (generalized anxiety disorder) 11/10/2018   Urinary, incontinence, stress female 10/27/2016   Genetic testing  02/17/2016   Morbid obesity with BMI of 45.0-49.9, adult (HCC) 10/21/2015   Port catheter in place 07/30/2015   Abscess of groin, right 06/19/2015   Noncompliance with therapeutic plan 06/15/2015   Chemotherapy induced neutropenia (HCC) 05/17/2015   Portacath in place 05/11/2015   Chemotherapy induced thrombocytopenia 05/11/2015   Hypokalemia 04/25/2015   Antineoplastic chemotherapy induced pancytopenia (HCC) 04/25/2015   Fever 04/25/2015   Rash 04/25/2015   Diarrhea 04/25/2015   Anemia due to antineoplastic chemotherapy 04/16/2015    Folliculitis 04/16/2015   Leukopenia due to antineoplastic chemotherapy (HCC) 04/16/2015   Morbid obesity with BMI of 50.0-59.9, adult (HCC) 03/23/2015   History of melanoma excision 03/23/2015   Asthma exacerbation attacks 03/23/2015   Crohn's disease without complication (HCC) 03/23/2015   Umbilical hernia without obstruction and without gangrene 03/23/2015   Essential hypertension 03/23/2015   Ovarian CA, left (HCC) 02/11/2015   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   CANDIDIASIS OF VULVA AND VAGINA 04/10/2009   CONTACT DERMATITIS&OTHER ECZEMA DUE UNSPEC CAUSE 04/10/2009   CELLULITIS AND ABSCESS OF LEG EXCEPT FOOT 03/24/2009   EDEMA 03/24/2009   OPEN WOUND OF KNEE LEG AND ANKLE COMPLICATED 03/10/2009   RECTAL BLEEDING 10/15/2008   MORBID OBESITY 06/01/2007   Anxiety state 06/01/2007   HYPERTENSION 06/01/2007   Asthma 06/01/2007   GASTROESOPHAGEAL REFLUX DISEASE 06/01/2007   CROHN'S DISEASE, SMALL INTESTINE 06/01/2007   Arthritis 06/01/2007   MALIGNANT MELANOMA, HX OF 06/01/2007   Skin cancer 04/12/2001     Medication Assistance:  Patient has Medicare Extra Help / LIS for 2024.    Assessment / Plan: Asthma - recent exacerbation in June 2024 Continue generic Symbicort 2 puff twice a day - requested refill from [facility] Use albuterol inhaler or nebs as needed - sent in updated prescription for rescue inhaler.   Medication Management:  Reviewed med list and updated Reviewed refill history and discussed adherence.  Coordinated refills for Symbicort, Ventolin and pantoprazole with [facility]  Meds ordered this encounter  Medications   albuterol (VENTOLIN HFA) 108 (90 Base) MCG/ACT inhaler    Sig: Inhale 1-2 puffs into the lungs every 6 (six) hours as needed for wheezing or shortness of breath. Patient prefers brand Ventolin HFA inhaler (tier 3 per her insurance)    Dispense:  6.7 g    Refill:  2     Morbid Obesity:  Discussed that currently Medicare only covers Reginal Lutes is  patient has history of MI, stroke or PAD.  (secondary CVD risk reduction in patients that already have diagnosis of CVD) Education provided about mechanism of action of bupropion and naltrexone. They can help with cravings and weight loss but may take about 1 month to see effects on appetite or weight.    Follow Up:  3 months   Henrene Pastor, PharmD Clinical Pharmacist [facility]  - [facility] [phone]

## 2023-01-12 DIAGNOSIS — D225 Melanocytic nevi of trunk: Secondary | ICD-10-CM | POA: Diagnosis not present

## 2023-01-12 DIAGNOSIS — D485 Neoplasm of uncertain behavior of skin: Secondary | ICD-10-CM | POA: Diagnosis not present

## 2023-01-12 DIAGNOSIS — Z85820 Personal history of malignant melanoma of skin: Secondary | ICD-10-CM | POA: Diagnosis not present

## 2023-01-12 DIAGNOSIS — L718 Other rosacea: Secondary | ICD-10-CM | POA: Diagnosis not present

## 2023-01-12 DIAGNOSIS — L309 Dermatitis, unspecified: Secondary | ICD-10-CM | POA: Diagnosis not present

## 2023-01-21 ENCOUNTER — Telehealth: Payer: Medicare Other | Admitting: Family Medicine

## 2023-01-21 ENCOUNTER — Encounter: Payer: Self-pay | Admitting: Family Medicine

## 2023-01-21 ENCOUNTER — Other Ambulatory Visit: Payer: Self-pay | Admitting: Family Medicine

## 2023-01-21 ENCOUNTER — Ambulatory Visit: Payer: Medicare Other | Admitting: Family Medicine

## 2023-01-21 DIAGNOSIS — Z6841 Body Mass Index (BMI) 40.0 and over, adult: Secondary | ICD-10-CM

## 2023-01-21 NOTE — Patient Instructions (Addendum)
We are sending a new order for the 2 mg/week dosage.   Keep the diet clean and stay active.  Let us know if you need anything.

## 2023-01-21 NOTE — Progress Notes (Signed)
Chief Complaint  Patient presents with   Follow-up    Weight loss medication     Subjective: Patient is a 62 y.o. female here for weight follow up.  Lost 12 lbs. She is on semaglutide 1.5 mg/week and interested in moving up. Compliant, no AE's. Has been walking, elliptical machine, some wt lifting. Diet is healthy. Cravings are decreased, appetite as well. No CP or SOB.   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  The Custom gene panel offered by [facility] includes sequencing and rearrangement analysis for the following 24 genes:  ATM, BAP1, BARD1, BRCA1, BRCA2, BRIP1, CDH1, CDK4, CDKN2A, CHEK2, EPCAM, FANCC, MITF, MLH1, MSH2, MSH6, NBN, PALB2, PMS2, PTEN, RAD51C, RAD51D, TP53, and XRCC2.   The report date is February 13, 2016.   GERD (gastroesophageal reflux disease)    Heart murmur    History of melanoma excision 03/23/2015   Hypertension    Leukopenia due to antineoplastic chemotherapy (HCC) 04/16/2015   Melanoma (HCC) dx'd 2003   right leg   Morbid obesity with BMI of 50.0-59.9, adult (HCC) 03/23/2015   OPEN WOUND OF KNEE LEG AND ANKLE COMPLICATED 03/10/2009   Qualifier: Diagnosis of  By: Lennox Laity RN, Denise     ovarian ca dx'd 10/216   Seasonal allergies    Umbilical hernia without obstruction and without gangrene 03/23/2015   Urinary, incontinence, stress female 10/27/2016    Objective: BP 120/79 (BP Location: Right Arm, Patient Position: Sitting, Cuff Size: Large)   Pulse 100   Temp 98.9 F (37.2 C) (Oral)   Ht 5\' 1"  (1.549 m)   Wt 242 lb 4  oz (109.9 kg)   LMP  (LMP Unknown)   SpO2 98%   BMI 45.77 kg/m  General: Awake, appears stated age Heart: RRR Lungs: CTAB, no rales, wheezes or rhonchi. No accessory muscle use Psych: Age appropriate judgment and insight, normal affect and mood  Assessment and Plan: MORBID OBESITY  Chronic, unstable. Improving but will increase dosage to semaglutide 2 mg/week. Counseled on diet/exercise.  The patient voiced understanding and agreement to the plan.  Jilda Roche [place], DO 01/21/23  3:50 PM

## 2023-01-24 ENCOUNTER — Telehealth: Payer: Self-pay | Admitting: Family Medicine

## 2023-01-24 NOTE — Telephone Encounter (Signed)
Called left message to call back

## 2023-01-24 NOTE — Telephone Encounter (Signed)
Called the patient informed of PCP response. She said they will not cover zepbound and wegovy. She wanted to know if there is anything else.

## 2023-01-24 NOTE — Telephone Encounter (Signed)
Called the patient back She has contacted her insurance and they will cover Ozempic and Mounjaro.

## 2023-01-24 NOTE — Telephone Encounter (Signed)
Pt was returning Robin's call.

## 2023-01-24 NOTE — Telephone Encounter (Signed)
Pt called and requested to speak with nurse regarding "insurance information". I asked pt to elaborate but pt prefers to just discuss nurse. Please call and advise.

## 2023-01-24 NOTE — Telephone Encounter (Signed)
Nothing in particular that works and has a good side effect profile unfortunately.

## 2023-01-24 NOTE — Telephone Encounter (Signed)
That's great, but she doesn't have diabetes so they won't cover it for her.

## 2023-01-25 NOTE — Telephone Encounter (Signed)
The patient would like to continue on the semaglutide 1 mg weekly. She cannot increase the dose due to cost. Send to [facility]

## 2023-01-25 NOTE — Telephone Encounter (Signed)
Called and informed the patient of PCP response

## 2023-01-25 NOTE — Telephone Encounter (Signed)
Called in prescription to [facility] will call the patient to inform

## 2023-02-16 ENCOUNTER — Other Ambulatory Visit: Payer: Self-pay

## 2023-02-16 ENCOUNTER — Telehealth: Payer: Self-pay

## 2023-02-16 MED ORDER — SERTRALINE HCL 50 MG PO TABS: 50.0000 mg | ORAL_TABLET | Freq: Every day | ORAL | 3 refills | Status: DC

## 2023-02-16 NOTE — Telephone Encounter (Signed)
Sent to provider 

## 2023-02-16 NOTE — Telephone Encounter (Signed)
Yes refill sent

## 2023-02-18 ENCOUNTER — Other Ambulatory Visit: Payer: Self-pay | Admitting: Family Medicine

## 2023-02-21 ENCOUNTER — Other Ambulatory Visit: Payer: Self-pay

## 2023-02-21 ENCOUNTER — Telehealth: Payer: Self-pay | Admitting: Family Medicine

## 2023-02-21 NOTE — Telephone Encounter (Signed)
Patient called to see if she could get a call from CMA to discuss a medication she received last month. Please call her to discuss.

## 2023-02-22 ENCOUNTER — Telehealth: Payer: Self-pay

## 2023-02-22 ENCOUNTER — Other Ambulatory Visit: Payer: Self-pay

## 2023-02-22 MED ORDER — SEMAGLUTIDE-WEIGHT MANAGEMENT 1.7 MG/0.75ML ~~LOC~~ SOAJ: 1.7000 mg | SUBCUTANEOUS | 5 refills | Status: DC

## 2023-02-22 NOTE — Telephone Encounter (Signed)
Called pt was advised Semaglutide 1.7 fax to [employer].

## 2023-02-25 ENCOUNTER — Other Ambulatory Visit: Payer: Self-pay | Admitting: Pharmacist

## 2023-02-25 MED ORDER — ALBUTEROL SULFATE (2.5 MG/3ML) 0.083% IN NEBU: 2.5000 mg | INHALATION_SOLUTION | Freq: Four times a day (QID) | RESPIRATORY_TRACT | 1 refills | Status: DC | PRN

## 2023-02-25 NOTE — Progress Notes (Signed)
Pharmacy Note  02/25/2023 Name: [name] MRN: [id number] DOB: 04/20/1960  Subjective: [name] is a 62 y.o. year old female who is a primary care patient of Sharlene Dory, DO. Clinical Pharmacist Practitioner referral was placed to assist with medication management.    Engaged with patient by telephone for follow up visit today.  Asthma -  Current therapy: Symbicort (Generic) 2 puffs twice a day and albuterol inhaler or nebs as needed for shortness of breath.  Rescue therapy - albuterol inhaler or nebulizer solution Uses albuterol about once per week.   Medications tried in past: Advair - patient reports she was "allergic" to something in Advair - it make her nauseated / caused vomiting.   Medication Management:  Patient requests update prescriptions for alprazolam and albuterol neb solution Also needs refill for amlodipine and pantoprazole  Morbid Obesity: Patient has been taking semaglutide weekly for the last 2 to 3 months. She has lost about 12 to 15 lbs. She feels very good about her progress.      Lab Results  Component Value Date   HGBA1C 6.1 05/12/2009       Component Value Date/Time   CHOL 110 09/22/2022 1118   TRIG 55.0 09/22/2022 1118   HDL 40.70 09/22/2022 1118   CHOLHDL 3 09/22/2022 1118   VLDL 11.0 09/22/2022 1118   LDLCALC 58 09/22/2022 1118   LDLCALC 84 01/15/2020 1358     Allergies  Allergen Reactions   Butorphanol Tartrate Other (See Comments)    Gave her the shakes for 6 weeks   Levofloxacin Palpitations    REACTION: tachycardia   Moxifloxacin Palpitations    REACTION: tachycardia but tolerated cirpofloxacin just fine   Stadol [Butorphanol] Other (See Comments)    Caused shakes for 6 weeks "shaking for weeks"   Amoxicillin Rash    REACTION: Rash with high doses. Can take lower doses like 500mg    Asa [Aspirin] Other (See Comments)    Pt stated the 81 mg caused Exacerbation of her asthma    Ciprofloxacin Hcl  Nausea And Vomiting    Can take lower doses like 500mg .   Clindamycin/Lincomycin Nausea And Vomiting   Codeine Nausea And Vomiting   Effexor [Venlafaxine] Other (See Comments)    hyperactivity Panic attacks   Erythromycin Diarrhea and Nausea And Vomiting   Latex Rash   Losartan Nausea Only and Other (See Comments)    Headache, fatigue   Paroxetine Other (See Comments)    Made nerves worse and caused fatigue   Cephalosporins Other (See Comments)    REACTION: she has tolerated rocephin and Keflex without difficulty   Clarithromycin Diarrhea   Clindamycin Diarrhea   Influenza Vaccines Other (See Comments)    Pt states that she got really sick from flu vaccine was sick a total of 6 weeks   Paxil [Paroxetine Hcl] Other (See Comments)    fatigue   Tape Rash    Medications Reviewed Today     Reviewed by Henrene Pastor, RPH-CPP (Pharmacist) on 02/25/23 at 1532  Med List Status: <None>   Medication Order Taking? Sig Documenting Provider Last Dose Status Informant  albuterol (PROVENTIL) (2.5 MG/3ML) 0.083% nebulizer solution [id number] Yes Take 3 mLs (2.5 mg total) by nebulization every 6 (six) hours as needed for wheezing or shortness of breath. Sharlene Dory, DO Taking Active   albuterol (VENTOLIN HFA) 108 (90 Base) MCG/ACT inhaler [id number] Yes INHALE 1 TO 2 PUFFS INTO THE LUNGS EVERY 6 HOURS AS NEEDED  FOR WHEEZING OR SHORTNESS OF BREATH [place], Jilda Roche, DO Taking Active   ALPRAZolam Prudy Feeler) 1 MG tablet [id number] Yes Take 0.5-1 tablets (0.5-1 mg total) by mouth at bedtime as needed for anxiety. Sharlene Dory, DO Taking Active   amLODipine (NORVASC) 2.5 MG tablet [id number] Yes TAKE 1 TABLET(2.5 MG) BY MOUTH IN THE MORNING AND AT BEDTIME Sharlene Dory, DO Taking Active   budesonide-formoterol [facility]) 160-4.5 MCG/ACT inhaler [id number] Yes Inhale 2 puffs into the lungs 2 (two) times daily. Sharlene Dory, DO Taking Active   diclofenac Sodium  (VOLTAREN) 1 % GEL [id number]  Apply 2 g topically 4 (four) times daily. Sharlene Dory, DO  Active   Lactobacillus (ACIDOPHILUS PO) [id number]  Take 2 capsules by mouth daily.  [provider]  Active Self  Nebulizers MISC [id number] Yes 1 Units by Misc.(Non-Drug; Combo Route) route every 8 (eight) hours as needed. [provider] Taking Active   pantoprazole (PROTONIX) 40 MG tablet [id number] Yes TAKE 1 TABLET(40 MG) BY MOUTH DAILY Carmelia Roller, Jilda Roche, DO Taking Active   promethazine (PHENERGAN) 25 MG tablet [id number] Yes Take 1 tablet (25 mg total) by mouth every 6 (six) hours as needed for nausea or vomiting. Sharlene Dory, DO Taking Active   SEMAGLUTIDE, 1 MG/DOSE, [place] [id number]  Inject 1 mg into the skin once a week. [provider]  Active   Semaglutide-Weight Management 1.7 MG/0.75ML SOAJ [id number] Yes Inject 1.7 mg into the skin once a week. Sharlene Dory, DO Taking Active   sertraline (ZOLOFT) 50 MG tablet [id number] Yes Take 1 tablet (50 mg total) by mouth daily. Sharlene Dory, DO Taking Active   triamterene-hydrochlorothiazide (MAXZIDE) 75-50 MG tablet [id number] Yes Take 1 tablet by mouth daily. Reported on 06/12/2015 Sharlene Dory, DO Taking Active   VITAMIN E PO [id number] Yes Take by mouth daily. [provider] Taking Active             Patient Active Problem List   Diagnosis Date Noted   Moderate persistent asthma without complication 08/13/2020   Chronic diastolic (congestive) heart failure (HCC) 08/13/2020   Thrombocytopenia, unspecified (HCC) 08/13/2020   History of DVT (deep vein thrombosis) 06/12/2019   Cough 11/30/2018   GAD (generalized anxiety disorder) 11/10/2018   Urinary, incontinence, stress female 10/27/2016   Genetic testing 02/17/2016   Morbid obesity with BMI of 45.0-49.9, adult (HCC) 10/21/2015   Port-A-Cath in place 07/30/2015   Abscess of groin, right 06/19/2015    Noncompliance with therapeutic plan 06/15/2015   Chemotherapy induced neutropenia (HCC) 05/17/2015   Port-A-Cath in place 05/11/2015   Chemotherapy-induced thrombocytopenia 05/11/2015   Hypokalemia 04/25/2015   Antineoplastic chemotherapy induced pancytopenia (HCC) 04/25/2015   Fever 04/25/2015   Rash 04/25/2015   Diarrhea 04/25/2015   Anemia due to antineoplastic chemotherapy 04/16/2015   Folliculitis 04/16/2015   Leukopenia due to antineoplastic chemotherapy (HCC) 04/16/2015   Morbid obesity with BMI of 50.0-59.9, adult (HCC) 03/23/2015   History of melanoma excision 03/23/2015   Asthma exacerbation attacks 03/23/2015   Crohn's disease without complication (HCC) 03/23/2015   Umbilical hernia without obstruction and without gangrene 03/23/2015   Essential hypertension 03/23/2015   Ovarian CA, left (HCC) 02/11/2015   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   CANDIDIASIS OF VULVA AND VAGINA 04/10/2009   CONTACT DERMATITIS&OTHER ECZEMA DUE UNSPEC CAUSE 04/10/2009   CELLULITIS AND ABSCESS OF LEG EXCEPT FOOT 03/24/2009   EDEMA 03/24/2009   OPEN WOUND OF KNEE LEG AND ANKLE COMPLICATED 03/10/2009  RECTAL BLEEDING 10/15/2008   MORBID OBESITY 06/01/2007   Anxiety state 06/01/2007   Essential hypertension 06/01/2007   Asthma 06/01/2007   GASTROESOPHAGEAL REFLUX DISEASE 06/01/2007   CROHN'S DISEASE, SMALL INTESTINE 06/01/2007   Arthritis 06/01/2007   MALIGNANT MELANOMA, HX OF 06/01/2007   Skin cancer 04/12/2001     Medication Assistance:  Patient has Medicare Extra Help / LIS for 2024.    Assessment / Plan: Asthma - recent exacerbation in June 2024 Continue generic Symbicort 2 puff twice a day  Use albuterol inhaler or nebs as needed - will request refill for albuterol neb from PCP  Medication Management:  Reviewed med list and updated Reviewed refill history and discussed adherence.  Coordinated refills for amlodipine and pantoprazole with [facility].  Will request updated Rxs for  albuterol nebs and alprazolam from PCP.   Morbid Obesity:  Continue semaglutide weekly  Follow Up:  3 months   Henrene Pastor, PharmD Clinical Pharmacist [facility] Primary Care  - [facility] [phone]

## 2023-02-25 NOTE — Telephone Encounter (Signed)
Is she using this daily? She shouldn't need a refill yet.

## 2023-02-25 NOTE — Telephone Encounter (Signed)
Patient is requesting refills for albuterol nebs and alprazolam 1mg .   LR for albuterol nebs was for 150 mL on 04/03/2022  LR for alprazolam was 12/01/2022 for #45 tabs. See below for refill history.      Dispensed Days Supply Quantity Pharmacy  ALPRAZOLAM 1MG  TAB 12/01/2022 45 45 each [facility] #...  ALPRAZOLAM 1MG  TAB 09/22/2022 45 45 each [facility] #...  ALPRAZOLAM 1MG  TAB 06/18/2022 45 45 each [facility] #...  ALPRAZOLAM 1MG  TAB 03/16/2022 45 45 each [facility] #.Marland KitchenMarland Kitchen

## 2023-03-23 ENCOUNTER — Encounter: Payer: Medicare Other | Admitting: Family Medicine

## 2023-04-15 ENCOUNTER — Encounter: Payer: Self-pay | Admitting: Oncology

## 2023-05-11 ENCOUNTER — Encounter: Payer: Self-pay | Admitting: Family Medicine

## 2023-05-11 ENCOUNTER — Telehealth: Payer: Self-pay | Admitting: Neurology

## 2023-05-11 ENCOUNTER — Encounter: Payer: Self-pay | Admitting: Oncology

## 2023-05-11 ENCOUNTER — Encounter: Payer: Medicare Other | Admitting: Family Medicine

## 2023-05-11 ENCOUNTER — Ambulatory Visit: Payer: Medicare Other | Admitting: Family Medicine

## 2023-05-11 VITALS — BP 130/72 | HR 102 | Temp 98.0°F | Resp 16 | Ht 61.0 in | Wt 229.0 lb

## 2023-05-11 DIAGNOSIS — L409 Psoriasis, unspecified: Secondary | ICD-10-CM | POA: Diagnosis not present

## 2023-05-11 DIAGNOSIS — Z0000 Encounter for general adult medical examination without abnormal findings: Secondary | ICD-10-CM | POA: Diagnosis not present

## 2023-05-11 DIAGNOSIS — I10 Essential (primary) hypertension: Secondary | ICD-10-CM | POA: Diagnosis not present

## 2023-05-11 LAB — LIPID PANEL
Cholesterol: 115 mg/dL (ref 0–200)
HDL: 36.1 mg/dL — ABNORMAL LOW (ref >39.00)
LDL Cholesterol: 61 mg/dL (ref 0–99)
NonHDL: 79.39
Total CHOL/HDL Ratio: 3
Triglycerides: 92 mg/dL (ref 0.0–149.0)
VLDL: 18.4 mg/dL (ref 0.0–40.0)

## 2023-05-11 LAB — COMPREHENSIVE METABOLIC PANEL
ALT: 12 U/L (ref 0–35)
AST: 23 U/L (ref 0–37)
Albumin: 3.2 g/dL — ABNORMAL LOW (ref 3.5–5.2)
Alkaline Phosphatase: 74 U/L (ref 39–117)
BUN: 21 mg/dL (ref 6–23)
CO2: 23 meq/L (ref 19–32)
Calcium: 9 mg/dL (ref 8.4–10.5)
Chloride: 103 meq/L (ref 96–112)
Creatinine, Ser: 0.89 mg/dL (ref 0.40–1.20)
GFR: 69.29 mL/min (ref >60.00)
Glucose, Bld: 86 mg/dL (ref 70–99)
Potassium: 4.5 meq/L (ref 3.5–5.1)
Sodium: 133 meq/L — ABNORMAL LOW (ref 135–145)
Total Bilirubin: 0.5 mg/dL (ref 0.2–1.2)
Total Protein: 7.5 g/dL (ref 6.0–8.3)

## 2023-05-11 LAB — CBC
HCT: 22.6 % — CL (ref 36.0–46.0)
Hemoglobin: 6.5 g/dL — CL (ref 12.0–15.0)
MCHC: 28.7 g/dL — ABNORMAL LOW (ref 30.0–36.0)
MCV: 66 fL — ABNORMAL LOW (ref 78.0–100.0)
Platelets: 508 10*3/uL — ABNORMAL HIGH (ref 150.0–400.0)
RBC: 3.42 Mil/uL — ABNORMAL LOW (ref 3.87–5.11)
RDW: 19.9 % — ABNORMAL HIGH (ref 11.5–15.5)
WBC: 4.1 10*3/uL (ref 4.0–10.5)

## 2023-05-11 MED ORDER — TRIAMCINOLONE ACETONIDE 0.1 % EX CREA: 1.0000 | TOPICAL_CREAM | Freq: Two times a day (BID) | CUTANEOUS | 0 refills | Status: DC

## 2023-05-11 MED ORDER — ALPRAZOLAM 1 MG PO TABS: 0.5000 mg | ORAL_TABLET | Freq: Every evening | ORAL | 1 refills | Status: DC | PRN

## 2023-05-11 MED ORDER — METHYLPREDNISOLONE ACETATE 80 MG/ML IJ SUSP
80.0000 mg | Freq: Once | INTRAMUSCULAR | Status: AC
  Administered 2023-05-11: 80 mg via INTRAMUSCULAR

## 2023-05-11 MED ORDER — PANTOPRAZOLE SODIUM 40 MG PO TBEC: DELAYED_RELEASE_TABLET | ORAL | 2 refills | Status: DC

## 2023-05-11 NOTE — Telephone Encounter (Signed)
Pt called and lvm to return call

## 2023-05-11 NOTE — Progress Notes (Signed)
Chief Complaint  Patient presents with   Annual Exam    Annual Exam     Well Woman [name] is here for a complete physical.   Her last physical was >1 year ago.  Current diet: in general, a "healthy" diet. Current exercise: some walking, lifting wts, elliptical machine. Weight is intentionally going down and she denies fatigue out of ordinary. Seatbelt? Yes Advanced directive? No  Health Maintenance Pap/HPV- N/A Mammogram- Yes Colon cancer screening-Yes Shingrix- Done Tetanus- Yes Hep C screening- Yes HIV screening- Yes  Over the past mo, has had a scaly rash over her R forearm, finger, and anterior chest. No new topicals. Famhx of psoriasis. Tried Vaseline with minimal relief. No pain or drainage. Not spreading.   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  The Custom gene panel offered by [facility] includes sequencing and rearrangement analysis for the following 24 genes:  ATM, BAP1, BARD1, BRCA1, BRCA2, BRIP1, CDH1, CDK4, CDKN2A, CHEK2, EPCAM, FANCC, MITF, MLH1, MSH2, MSH6, NBN, PALB2, PMS2, PTEN, RAD51C, RAD51D, TP53, and XRCC2.   The report date is February 13, 2016.   GERD (gastroesophageal reflux disease)    Heart murmur    History of melanoma excision 03/23/2015   Hypertension    Leukopenia due to antineoplastic chemotherapy (HCC) 04/16/2015   Melanoma (HCC) dx'd 2003   right leg   Morbid obesity with BMI of 50.0-59.9, adult (HCC) 03/23/2015   OPEN WOUND OF KNEE LEG AND ANKLE COMPLICATED 03/10/2009   Qualifier: Diagnosis of  By:  Lennox Laity RN, Denise     ovarian ca dx'd 10/216   Seasonal allergies    Umbilical hernia without obstruction and without gangrene 03/23/2015   Urinary, incontinence, stress female 10/27/2016     Past Surgical History:  Procedure Laterality Date   ABDOMINAL HYSTERECTOMY Bilateral 02/11/2015   Procedure: HYSTERECTOMY ABDOMINAL, bilateral salpingo-oophorectomy;  Surgeon: Candice Camp, MD;  Location: [facility];  Service: Gynecology;  Laterality: Bilateral;   COLONOSCOPY WITH ESOPHAGOGASTRODUODENOSCOPY (EGD)     DILATATION & CURETTAGE/HYSTEROSCOPY WITH TRUECLEAR N/A 08/17/2013   Procedure: DILATATION & CURETTAGE/HYSTEROSCOPY WITH TRUCLEAR;  Surgeon: Turner Daniels, MD;  Location: [facility];  Service: Gynecology;  Laterality: N/A;   IR REMOVAL TUN ACCESS W/ PORT W/O FL MOD SED  07/05/2017   MELANOMA EXCISION  06/2001   right leg; also removed 2 lymph nodes   OMENTECTOMY N/A 02/11/2015   Procedure: OMENTECTOMY;  Surgeon: Candice Camp, MD;  Location: [facility];  Service: Gynecology;  Laterality: N/A;   UMBILICAL HERNIA REPAIR      Medications  Current Outpatient Medications on File Prior to Visit  Medication Sig Dispense Refill   albuterol (PROVENTIL) (2.5 MG/3ML) 0.083% nebulizer solution Take 3 mLs (2.5 mg total) by nebulization every 6 (six) hours as needed for wheezing or shortness of breath. 150 mL 1   albuterol (VENTOLIN HFA) 108 (90 Base) MCG/ACT inhaler INHALE 1 TO 2 PUFFS INTO THE LUNGS EVERY 6 HOURS AS NEEDED FOR WHEEZING OR SHORTNESS OF BREATH 6.7 g 2  amLODipine (NORVASC) 2.5 MG tablet TAKE 1 TABLET(2.5 MG) BY MOUTH IN THE MORNING AND AT BEDTIME 180 tablet 3   budesonide-formoterol (SYMBICORT) 160-4.5 MCG/ACT inhaler Inhale 2 puffs into the lungs 2 (two) times daily. 10.2 g 5   diclofenac Sodium (VOLTAREN) 1 % GEL Apply 2 g topically 4 (four) times daily. 100 g 0   Lactobacillus (ACIDOPHILUS PO) Take 2 capsules by mouth daily.      Nebulizers MISC 1 Units by Misc.(Non-Drug; Combo Route) route every 8  (eight) hours as needed.     Semaglutide-Weight Management 1.7 MG/0.75ML SOAJ Inject 1.7 mg into the skin once a week. 3 mL 5   sertraline (ZOLOFT) 50 MG tablet Take 1 tablet (50 mg total) by mouth daily. 90 tablet 3   triamterene-hydrochlorothiazide (MAXZIDE) 75-50 MG tablet Take 1 tablet by mouth daily. Reported on 06/12/2015 90 tablet 3   VITAMIN E PO Take by mouth daily.      Allergies Allergies  Allergen Reactions   Butorphanol Tartrate Other (See Comments)    Gave her the shakes for 6 weeks   Levofloxacin Palpitations    REACTION: tachycardia   Moxifloxacin Palpitations    REACTION: tachycardia but tolerated cirpofloxacin just fine   Stadol [Butorphanol] Other (See Comments)    Caused shakes for 6 weeks "shaking for weeks"   Amoxicillin Rash    REACTION: Rash with high doses. Can take lower doses like 500mg    Asa [Aspirin] Other (See Comments)    Pt stated the 81 mg caused Exacerbation of her asthma    Ciprofloxacin Hcl Nausea And Vomiting    Can take lower doses like 500mg .   Clindamycin/Lincomycin Nausea And Vomiting   Codeine Nausea And Vomiting   Effexor [Venlafaxine] Other (See Comments)    hyperactivity Panic attacks   Erythromycin Diarrhea and Nausea And Vomiting   Latex Rash   Losartan Nausea Only and Other (See Comments)    Headache, fatigue   Paroxetine Other (See Comments)    Made nerves worse and caused fatigue   Cephalosporins Other (See Comments)    REACTION: she has tolerated rocephin and Keflex without difficulty   Clarithromycin Diarrhea   Clindamycin Diarrhea   Influenza Vaccines Other (See Comments)    Pt states that she got really sick from flu vaccine was sick a total of 6 weeks   Paxil [Paroxetine Hcl] Other (See Comments)    fatigue   Tape Rash    Review of Systems: Constitutional:  no unexpected weight changes Eye:  no recent significant change in vision Ear/Nose/Mouth/Throat:  Ears:  no recent change in hearing Nose/Mouth/Throat:  no  complaints of nasal congestion, no sore throat Cardiovascular: no chest pain Respiratory:  no shortness of breath Gastrointestinal:  no abdominal pain, no change in bowel habits GU:  Female: negative for dysuria or pelvic pain Musculoskeletal/Extremities:  no pain of the joints Integumentary (Skin/Breast):  no abnormal skin lesions reported Neurologic:  no headaches Endocrine:  denies fatigue  Exam BP 130/72   Pulse (!) 102   Temp 98 F (36.7 C) (Oral)   Resp 16   Ht 5\' 1"  (1.549 m)   Wt 229 lb (103.9 kg)   LMP  (LMP Unknown)   SpO2 98%   BMI 43.27 kg/m  General:  well developed, well nourished, in no apparent distress Skin: see below; otherwise no significant moles, warts, or growths Head:  no masses, lesions, or tenderness Eyes:  pupils equal and round, sclera anicteric without injection Ears:  canals  without lesions, TMs shiny without retraction, no obvious effusion, no erythema Nose:  nares patent, mucosa normal, and no drainage  Throat/Pharynx:  lips and gingiva without lesion; tongue and uvula midline; non-inflamed pharynx; no exudates or postnasal drainage Neck: neck supple without adenopathy, thyromegaly, or masses Lungs:  clear to auscultation, breath sounds equal bilaterally, no respiratory distress Cardio:  regular rate and rhythm, no LE edema Abdomen:  abdomen soft, nontender; bowel sounds normal; no masses or organomegaly Genital: Defer to GYN Musculoskeletal:  symmetrical muscle groups noted without atrophy or deformity Extremities:  no clubbing, cyanosis, or edema, no deformities, no skin discoloration Neuro:  gait slow, cautious; deep tendon reflexes normal and symmetric Psych: well oriented with normal range of affect and appropriate judgment/insight    Assessment and Plan  Well adult exam  Psoriasis - Plan: triamcinolone cream (KENALOG) 0.1 %  Essential hypertension - Plan: CBC, Comprehensive metabolic panel, Lipid panel   Well 63 y.o.  female. Counseled on diet and exercise. Advanced directive form provided today.  Does not have a cervix, does not require cerv cancer screening.  Psoriasis: new dx, topical steroid sent in. Could consider biopsy vs referral if no better. Steroid injection today.  Other orders as above. Follow up in 6 mo. The patient voiced understanding and agreement to the plan.  Jilda Roche [place], DO 05/11/23 11:04 AM

## 2023-05-11 NOTE — Patient Instructions (Addendum)
Give [place] 2-3 business days to get the results of your labs back.   Keep the diet clean and stay active.  Let me know if you are not turning the corner with your skin.   Please get me a copy of your advanced directive form at your convenience.   Let us know if you need anything.

## 2023-05-11 NOTE — Telephone Encounter (Signed)
PATIENT CALLED BACK IN STATED SHE DOES NOT NEED TO BE SEEN AT THE ER NOR IS SHE DIZZY ARE [name]

## 2023-05-11 NOTE — Telephone Encounter (Signed)
I called and spoke with the patient.  I did talk her to go to the emergency department given the recent findings.  Upon further questioning, she had been having dark stools.  She is largely asymptomatic, mentions some lightheadedness that is slight when she works out.  She reports she will go to [facility] emergency department for further evaluation. I did call their ED to update the care team.

## 2023-05-11 NOTE — Telephone Encounter (Signed)
CRITICAL VALUE STICKER  CRITICAL VALUE: Hemoglobin 6.5 Hematocrit 22.6  RECEIVER (on-site recipient of call): Mileigh Tilley  DATE & TIME NOTIFIED: 05/11/2023 3:57pm  MESSENGER (representative from lab): Esma  MD NOTIFIED: yes

## 2023-05-11 NOTE — Telephone Encounter (Signed)
Plz reach out to pt to see how she is doing. She had no symptoms to suggest this earlier. If she is dizzy, lightheaded or feeling faint/weak, immediately go to ER. If not, have her return tomorrow AM for a CBC, Fe studies, B12, LDH, haptoglobin.

## 2023-05-11 NOTE — Addendum Note (Signed)
Addended by: Kathi Ludwig on: 05/11/2023 11:09 AM   Modules accepted: Orders

## 2023-05-12 ENCOUNTER — Telehealth: Payer: Self-pay

## 2023-05-12 ENCOUNTER — Other Ambulatory Visit: Payer: Medicare Other

## 2023-05-12 ENCOUNTER — Other Ambulatory Visit (INDEPENDENT_AMBULATORY_CARE_PROVIDER_SITE_OTHER): Payer: Medicare Other

## 2023-05-12 DIAGNOSIS — D649 Anemia, unspecified: Secondary | ICD-10-CM | POA: Diagnosis not present

## 2023-05-12 LAB — CBC WITH DIFFERENTIAL/PLATELET
Basophils Absolute: 0 10*3/uL (ref 0.0–0.1)
Basophils Relative: 0.8 % (ref 0.0–3.0)
Eosinophils Absolute: 0 10*3/uL (ref 0.0–0.7)
Eosinophils Relative: 0.2 % (ref 0.0–5.0)
HCT: 22.1 % — CL (ref 36.0–46.0)
Hemoglobin: 6.3 g/dL — CL (ref 12.0–15.0)
Lymphocytes Relative: 9.4 % — ABNORMAL LOW (ref 12.0–46.0)
Lymphs Abs: 0.4 10*3/uL — ABNORMAL LOW (ref 0.7–4.0)
MCHC: 28.7 g/dL — ABNORMAL LOW (ref 30.0–36.0)
MCV: 65.8 fL — ABNORMAL LOW (ref 78.0–100.0)
Monocytes Absolute: 0.4 10*3/uL (ref 0.1–1.0)
Monocytes Relative: 8 % (ref 3.0–12.0)
Neutro Abs: 3.9 10*3/uL (ref 1.4–7.7)
Neutrophils Relative %: 81.6 % — ABNORMAL HIGH (ref 43.0–77.0)
Platelets: 468 10*3/uL — ABNORMAL HIGH (ref 150.0–400.0)
RBC: 3.36 Mil/uL — ABNORMAL LOW (ref 3.87–5.11)
RDW: 19.9 % — ABNORMAL HIGH (ref 11.5–15.5)
WBC: 4.8 10*3/uL (ref 4.0–10.5)

## 2023-05-12 NOTE — Telephone Encounter (Signed)
Patient came in to office to have blood work done. She stated she went to ER last night and left due to the wait. Did you want me to drop the orders or you will?

## 2023-05-12 NOTE — Telephone Encounter (Signed)
She was directed to the ER last night, did she go?

## 2023-05-12 NOTE — Progress Notes (Signed)
No charge today. Pt here for repeat verification.

## 2023-05-12 NOTE — Telephone Encounter (Signed)
Orders placed for CBCw/diff STAT.

## 2023-05-12 NOTE — Telephone Encounter (Signed)
Copied from CRM [id number]. Topic: Clinical - Request for Lab/Test Order >> May 12, 2023  8:15 AM Bo Mcclintock wrote: Reason for CRM: Pt states Dr. Carmelia Roller wants her to come in to repeat Lipid Panel/CBC/Comprehensive metabolic panel. I scheduled the pt but there were no orders for these labs in epic

## 2023-05-12 NOTE — Telephone Encounter (Signed)
Open in error

## 2023-05-12 NOTE — Telephone Encounter (Signed)
Refer to other phone note .

## 2023-05-12 NOTE — Telephone Encounter (Signed)
CRITICAL VALUE STICKER  CRITICAL VALUE: Hemoglobin 6.3 and hematocrit 22.1   RESPONSE:  Pt has been advised by Ria Clock, FNP and PCP to head to ED immediately as her levels are critically low.

## 2023-05-13 ENCOUNTER — Telehealth: Payer: Self-pay

## 2023-05-13 DIAGNOSIS — R161 Splenomegaly, not elsewhere classified: Secondary | ICD-10-CM | POA: Diagnosis not present

## 2023-05-13 DIAGNOSIS — K5730 Diverticulosis of large intestine without perforation or abscess without bleeding: Secondary | ICD-10-CM | POA: Diagnosis not present

## 2023-05-13 DIAGNOSIS — M7989 Other specified soft tissue disorders: Secondary | ICD-10-CM | POA: Diagnosis not present

## 2023-05-13 DIAGNOSIS — K449 Diaphragmatic hernia without obstruction or gangrene: Secondary | ICD-10-CM | POA: Diagnosis not present

## 2023-05-13 DIAGNOSIS — E871 Hypo-osmolality and hyponatremia: Secondary | ICD-10-CM | POA: Diagnosis not present

## 2023-05-13 DIAGNOSIS — R040 Epistaxis: Secondary | ICD-10-CM | POA: Diagnosis not present

## 2023-05-13 DIAGNOSIS — I7090 Unspecified atherosclerosis: Secondary | ICD-10-CM | POA: Diagnosis not present

## 2023-05-13 DIAGNOSIS — R042 Hemoptysis: Secondary | ICD-10-CM | POA: Diagnosis not present

## 2023-05-13 DIAGNOSIS — R918 Other nonspecific abnormal finding of lung field: Secondary | ICD-10-CM | POA: Diagnosis not present

## 2023-05-13 DIAGNOSIS — Z8719 Personal history of other diseases of the digestive system: Secondary | ICD-10-CM | POA: Diagnosis not present

## 2023-05-13 DIAGNOSIS — D509 Iron deficiency anemia, unspecified: Secondary | ICD-10-CM | POA: Diagnosis not present

## 2023-05-13 NOTE — Telephone Encounter (Signed)
Called pt lvm needing her to call our office and sent provider message.

## 2023-05-13 NOTE — Telephone Encounter (Signed)
Copied from CRM [id number]. Topic: Clinical - Medical Advice >> May 13, 2023  3:17 PM Isabell A wrote: Reason for CRM: Patient returning phone call from [facility], states she is currently in the ER - her phone does not have good service at the moment.

## 2023-05-16 ENCOUNTER — Encounter: Payer: Self-pay | Admitting: Gastroenterology

## 2023-05-16 ENCOUNTER — Telehealth: Payer: Self-pay

## 2023-05-16 NOTE — Telephone Encounter (Signed)
Pt called back stated she did go to ER on Friday states she is currently in the ER - her phone does not have good service at the moment.

## 2023-05-17 ENCOUNTER — Telehealth: Payer: Self-pay

## 2023-05-17 DIAGNOSIS — H43813 Vitreous degeneration, bilateral: Secondary | ICD-10-CM | POA: Diagnosis not present

## 2023-05-17 NOTE — Telephone Encounter (Signed)
 Returned pt call back from yesterday to f/u Ask her to call th office back.  Copied from CRM [id number]. Topic: General - Other >> May 16, 2023  2:33 PM Franky GRADE wrote: Reason for CRM: Patient is returning a call from [place], Amy HERO, they were unable to speak earlier due to patient being in the ER and not having great service.

## 2023-05-27 ENCOUNTER — Encounter: Payer: Self-pay | Admitting: Family Medicine

## 2023-05-27 ENCOUNTER — Ambulatory Visit (INDEPENDENT_AMBULATORY_CARE_PROVIDER_SITE_OTHER): Payer: Medicare Other | Admitting: Family Medicine

## 2023-05-27 ENCOUNTER — Telehealth: Payer: Self-pay

## 2023-05-27 VITALS — BP 126/74 | HR 96 | Temp 98.0°F | Resp 16 | Ht 61.0 in | Wt 222.2 lb

## 2023-05-27 DIAGNOSIS — D649 Anemia, unspecified: Secondary | ICD-10-CM | POA: Diagnosis not present

## 2023-05-27 DIAGNOSIS — K921 Melena: Secondary | ICD-10-CM

## 2023-05-27 LAB — CBC WITH DIFFERENTIAL/PLATELET
Basophils Absolute: 0 10*3/uL (ref 0.0–0.1)
Basophils Relative: 0.6 % (ref 0.0–3.0)
Eosinophils Absolute: 0 10*3/uL (ref 0.0–0.7)
Eosinophils Relative: 0.6 % (ref 0.0–5.0)
HCT: 29.4 % — ABNORMAL LOW (ref 36.0–46.0)
Hemoglobin: 9.1 g/dL — ABNORMAL LOW (ref 12.0–15.0)
Lymphocytes Relative: 8.8 % — ABNORMAL LOW (ref 12.0–46.0)
Lymphs Abs: 0.4 10*3/uL — ABNORMAL LOW (ref 0.7–4.0)
MCHC: 31 g/dL (ref 30.0–36.0)
MCV: 73.5 fL — ABNORMAL LOW (ref 78.0–100.0)
Monocytes Absolute: 0.4 10*3/uL (ref 0.1–1.0)
Monocytes Relative: 7.9 % (ref 3.0–12.0)
Neutro Abs: 3.9 10*3/uL (ref 1.4–7.7)
Neutrophils Relative %: 82.1 % — ABNORMAL HIGH (ref 43.0–77.0)
Platelets: 463 10*3/uL — ABNORMAL HIGH (ref 150.0–400.0)
RBC: 4.01 Mil/uL (ref 3.87–5.11)
RDW: 29 % — ABNORMAL HIGH (ref 11.5–15.5)
WBC: 4.8 10*3/uL (ref 4.0–10.5)

## 2023-05-27 MED ORDER — IRON (FERROUS SULFATE) 325 (65 FE) MG PO TABS: 325.0000 mg | ORAL_TABLET | Freq: Three times a day (TID) | ORAL | 2 refills | Status: DC

## 2023-05-27 MED ORDER — BETAMETHASONE DIPROPIONATE 0.05 % EX CREA: TOPICAL_CREAM | Freq: Two times a day (BID) | CUTANEOUS | 1 refills | Status: DC

## 2023-05-27 NOTE — Patient Instructions (Signed)
We will be in touch today.  Take an over the counter iron supplement (Ferrous sulfate) 1-3 times daily as your stomach will allow  Iron Rich foods:  Red meat Prunes Spinach Liver Cream of Wheat  Moderate sources: Iron-fortified cereals (bran, etc) Almonds Beans/peas Pork Lamb Ham Scallops [place] Peaches Peanuts  Let us know if you need anything.

## 2023-05-27 NOTE — Telephone Encounter (Signed)
Message sent to provider

## 2023-05-27 NOTE — Progress Notes (Signed)
Chief Complaint  Patient presents with   Follow-up    Follow up    Subjective: Patient is a 63 y.o. female here for ED f/u.  Patient was seen for a physical with [place] and hemoglobin was found to be 6.3.  She was instructed to go to the emergency department which she did.  There her hemoglobin was 5.7.  She received 2 units of packed red blood cells and sent home.  She reports having intermittent dark/tarry stools.  No abdominal pain, nausea, vomiting, or blood in your urine.  Interestingly, she is asymptomatic.  She has been taking 1 iron tab daily.  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  The Custom gene panel offered by [facility] includes sequencing and rearrangement analysis for the following 24 genes:  ATM, BAP1, BARD1, BRCA1, BRCA2, BRIP1, CDH1, CDK4, CDKN2A, CHEK2, EPCAM, FANCC, MITF, MLH1, MSH2, MSH6, NBN, PALB2, PMS2, PTEN, RAD51C, RAD51D, TP53, and XRCC2.   The report date is February 13, 2016.   GERD (gastroesophageal reflux disease)    Heart murmur    History of melanoma excision 03/23/2015   Hypertension    Leukopenia due to antineoplastic chemotherapy (HCC) 04/16/2015   Melanoma (HCC) dx'd 2003   right leg   Morbid obesity with BMI of 50.0-59.9, adult (HCC) 03/23/2015   OPEN WOUND OF KNEE LEG AND ANKLE COMPLICATED 03/10/2009   Qualifier: Diagnosis of  By: Lennox Laity RN, Denise     ovarian ca dx'd 10/216   Seasonal allergies    Umbilical hernia without obstruction and without gangrene 03/23/2015   Urinary, incontinence, stress female 10/27/2016     Objective: BP 126/74 (BP Location: Left Arm, Patient Position: Sitting)   Pulse 96   Temp 98 F (36.7 C) (Oral)   Resp 16   Ht 5\' 1"  (1.549 m)   Wt 222 lb 3.2 oz (100.8 kg)   LMP  (LMP Unknown)   SpO2 98%   BMI 41.98 kg/m  General: Awake, appears stated age Heart: RRR, no LE edema Lungs: CTAB, no rales, wheezes or rhonchi. No accessory muscle use Abdomen: Bowel sounds present, soft, nontender, nondistended Psych: Age appropriate judgment and insight, normal affect and mood  Assessment and Plan: Severe anemia - Plan: Iron, TIBC and Ferritin Panel, CBC w/Diff  Melena  Status CBC shows a hemoglobin stable at 9.1.  I do not think she needs to be admitted at this point.  I will reach out to the gastroenterology team to see if she can get in sometime next week.  Question of melena, possibly needs an upper GI scope.  Continue iron supplementation.  If she does develop symptoms such as lightheadedness or dizziness, she will seek immediate care. The patient voiced understanding and agreement to the plan.  I spent 32 minutes with the patient discussing the above plans in addition to coordinating care and reviewing her chart on the same day of the visit.  Jilda Roche [place], DO 05/27/23  12:15 PM

## 2023-05-28 LAB — IRON,TIBC AND FERRITIN PANEL
%SAT: 33 % (ref 16–45)
Ferritin: 66 ng/mL (ref 16–288)
Iron: 98 ug/dL (ref 45–160)
TIBC: 299 ug/dL (ref 250–450)

## 2023-06-08 ENCOUNTER — Other Ambulatory Visit: Payer: Self-pay | Admitting: Family Medicine

## 2023-06-08 ENCOUNTER — Telehealth: Payer: Self-pay

## 2023-06-08 DIAGNOSIS — D649 Anemia, unspecified: Secondary | ICD-10-CM

## 2023-06-08 NOTE — Telephone Encounter (Signed)
 Copied from CRM [id number]. Topic: Referral - Question >> Jun 08, 2023  3:52 PM Truddie Crumble wrote: Reason for CRM: patient called checking on a referral for oncology   CB [phone]

## 2023-06-16 NOTE — Telephone Encounter (Signed)
Pt has appt on 06/20/23

## 2023-06-17 ENCOUNTER — Other Ambulatory Visit: Payer: Self-pay | Admitting: Family

## 2023-06-17 DIAGNOSIS — D649 Anemia, unspecified: Secondary | ICD-10-CM

## 2023-06-20 ENCOUNTER — Inpatient Hospital Stay (HOSPITAL_BASED_OUTPATIENT_CLINIC_OR_DEPARTMENT_OTHER): Payer: Medicare Other | Admitting: Family

## 2023-06-20 ENCOUNTER — Inpatient Hospital Stay: Payer: Medicare Other | Attending: Hematology & Oncology

## 2023-06-20 ENCOUNTER — Encounter: Payer: Self-pay | Admitting: Family

## 2023-06-20 VITALS — BP 141/82 | HR 86 | Temp 98.3°F | Resp 17 | Wt 220.0 lb

## 2023-06-20 DIAGNOSIS — D649 Anemia, unspecified: Secondary | ICD-10-CM | POA: Diagnosis not present

## 2023-06-20 DIAGNOSIS — K50911 Crohn's disease, unspecified, with rectal bleeding: Secondary | ICD-10-CM

## 2023-06-20 DIAGNOSIS — Z6841 Body Mass Index (BMI) 40.0 and over, adult: Secondary | ICD-10-CM | POA: Diagnosis not present

## 2023-06-20 DIAGNOSIS — Z79899 Other long term (current) drug therapy: Secondary | ICD-10-CM

## 2023-06-20 LAB — CBC WITH DIFFERENTIAL (CANCER CENTER ONLY)
Abs Immature Granulocytes: 0.01 10*3/uL (ref 0.00–0.07)
Basophils Absolute: 0 10*3/uL (ref 0.0–0.1)
Basophils Relative: 1 %
Eosinophils Absolute: 0.1 10*3/uL (ref 0.0–0.5)
Eosinophils Relative: 2 %
HCT: 32.3 % — ABNORMAL LOW (ref 36.0–46.0)
Hemoglobin: 9.7 g/dL — ABNORMAL LOW (ref 12.0–15.0)
Immature Granulocytes: 0 %
Lymphocytes Relative: 10 %
Lymphs Abs: 0.4 10*3/uL — ABNORMAL LOW (ref 0.7–4.0)
MCH: 25.1 pg — ABNORMAL LOW (ref 26.0–34.0)
MCHC: 30 g/dL (ref 30.0–36.0)
MCV: 83.5 fL (ref 80.0–100.0)
Monocytes Absolute: 0.3 10*3/uL (ref 0.1–1.0)
Monocytes Relative: 7 %
Neutro Abs: 3.4 10*3/uL (ref 1.7–7.7)
Neutrophils Relative %: 80 %
Platelet Count: 372 10*3/uL (ref 150–400)
RBC: 3.87 MIL/uL (ref 3.87–5.11)
Smear Review: NORMAL
WBC Count: 4.3 10*3/uL (ref 4.0–10.5)
nRBC: 0.7 % — ABNORMAL HIGH (ref 0.0–0.2)

## 2023-06-20 LAB — CMP (CANCER CENTER ONLY)
ALT: 12 U/L (ref 0–44)
AST: 25 U/L (ref 15–41)
Albumin: 3.4 g/dL — ABNORMAL LOW (ref 3.5–5.0)
Alkaline Phosphatase: 68 U/L (ref 38–126)
Anion gap: 7 (ref 5–15)
BUN: 20 mg/dL (ref 8–23)
CO2: 24 mmol/L (ref 22–32)
Calcium: 9.2 mg/dL (ref 8.9–10.3)
Chloride: 102 mmol/L (ref 98–111)
Creatinine: 0.82 mg/dL (ref 0.44–1.00)
GFR, Estimated: >60 mL/min (ref >60)
Glucose, Bld: 81 mg/dL (ref 70–99)
Potassium: 4.2 mmol/L (ref 3.5–5.1)
Sodium: 133 mmol/L — ABNORMAL LOW (ref 135–145)
Total Bilirubin: 0.5 mg/dL (ref 0.0–1.2)
Total Protein: 8.4 g/dL — ABNORMAL HIGH (ref 6.5–8.1)

## 2023-06-20 LAB — IRON AND IRON BINDING CAPACITY (CC-WL,HP ONLY)
Iron: 46 ug/dL (ref 28–170)
Saturation Ratios: 15 % (ref 10.4–31.8)
TIBC: 314 ug/dL (ref 250–450)
UIBC: 268 ug/dL (ref 148–442)

## 2023-06-20 LAB — RETICULOCYTES
Immature Retic Fract: 6.5 % (ref 2.3–15.9)
RBC.: 3.9 MIL/uL (ref 3.87–5.11)
Retic Count, Absolute: 74 10*3/uL (ref 19.0–186.0)
Retic Ct Pct: 1.9 % (ref 0.4–3.1)

## 2023-06-20 LAB — SAVE SMEAR(SSMR), FOR PROVIDER SLIDE REVIEW

## 2023-06-20 LAB — FERRITIN: Ferritin: 31 ng/mL (ref 11–307)

## 2023-06-20 LAB — LACTATE DEHYDROGENASE: LDH: 221 U/L — ABNORMAL HIGH (ref 98–192)

## 2023-06-20 NOTE — Progress Notes (Signed)
 Hematology/Oncology Consultation   Name: [name]      MRN: [id number]    Location: Room/bed info not found  Date: 06/20/2023 Time:11:21 AM   REFERRING PHYSICIAN:  Arva Chafe, DO  REASON FOR CONSULT:  Severe anemia    DIAGNOSIS: Sever anemia   HISTORY OF PRESENT ILLNESS:  [name] is a pleasant 63 yo caucasian female with recent symptomatic anemia (Hgb 5.7) treated with 2 units of blood in the ED on 05/13/2023.  She had noted some fatigue as well as a few dark tarry stools.  She has been referred to GI as well and sees them next week on 3/17. She has not noted any more dark, tarry stools or obvious blood.  No abnormal bruising, no petechiae.  She does have history of Crohn's.  She is taking oral iron once daily.  Mild fatigue at times.  We have seen her previously in 2022 for history of PE and DVT in 2019 (resolved). Hypercoag panel was negative. With her father's history of DVT's in the lower extremities we had her start taking 2 baby aspirin daily with food. No prior history of and no recurrent thrombotic events.  She stopped her aspirin with the recent anemia and will continue to hold until after work up with GI.  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 She was on chemotherapy 03/26/2015-05/29/2015. She states that she was only able to complete 3 out of 6 cycles due to side effects and intolerance. She completed her 5 years of surveillance with [name] in 05/2020. She was also seen by Dr. Ila [name].  She also has history of melanoma removed from the right leg.  She has chronic leg swelling which she states is stable.  No numbness or tingling in her extremities at this time.  She has generalized joint aches and pains with arthritis.  She ambulates with a cane for added support.  No falls or syncope reported.  No fever, chills, n/v, cough, dizziness, SOB, chest pain,  palpitations, abdominal pain or changes in bowel or bladder habits.  She has eczema and uses her prescribed topical cream as needed.  No diabetes or thyroid disease.  Appetite and hydration are good. She is currently on Ozempic to help with weight loss. She states that she is down 45 lbs so far. Weight today is 220 lbs.  No smoking, ETOH or recreational drug use.   ROS: All other 10 point review of systems is negative.   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  The Custom gene panel offered by [facility] includes sequencing and rearrangement analysis for the following 24 genes:  ATM, BAP1, BARD1, BRCA1, BRCA2, BRIP1, CDH1, CDK4, CDKN2A, CHEK2, EPCAM, FANCC, MITF, MLH1, MSH2, MSH6, NBN, PALB2, PMS2, PTEN, RAD51C, RAD51D, TP53, and XRCC2.   The report date is February 13, 2016.   GERD (gastroesophageal reflux disease)    Heart murmur  History of melanoma excision 03/23/2015   Hypertension    Leukopenia due to antineoplastic chemotherapy (HCC) 04/16/2015   Melanoma (HCC) dx'd 2003   right leg   Morbid obesity with BMI of 50.0-59.9, adult (HCC) 03/23/2015   OPEN WOUND OF KNEE LEG AND ANKLE COMPLICATED 03/10/2009   Qualifier: Diagnosis of  By: Lennox Laity RN, Denise     ovarian ca dx'd 10/216   Seasonal allergies    Umbilical hernia without obstruction and without gangrene 03/23/2015   Urinary, incontinence, stress female 10/27/2016    ALLERGIES: Allergies  Allergen Reactions   Butorphanol Tartrate Other (See Comments)    Gave her the  shakes for 6 weeks   Levofloxacin Palpitations    REACTION: tachycardia   Moxifloxacin Palpitations    REACTION: tachycardia but tolerated cirpofloxacin just fine   Stadol [Butorphanol] Other (See Comments)    Caused shakes for 6 weeks "shaking for weeks"   Amoxicillin Rash    REACTION: Rash with high doses. Can take lower doses like 500mg    Asa [Aspirin] Other (See Comments)    Pt stated the 81 mg caused Exacerbation of her asthma    Ciprofloxacin Hcl Nausea And Vomiting    Can take lower doses like 500mg .   Clindamycin/Lincomycin Nausea And Vomiting   Codeine Nausea And Vomiting   Effexor [Venlafaxine] Other (See Comments)    hyperactivity Panic attacks   Erythromycin Diarrhea and Nausea And Vomiting   Latex Rash   Losartan Nausea Only and Other (See Comments)    Headache, fatigue   Paroxetine Other (See Comments)    Made nerves worse and caused fatigue   Cephalosporins Other (See Comments)    REACTION: she has tolerated rocephin and Keflex without difficulty   Clarithromycin Diarrhea   Clindamycin Diarrhea   Influenza Vaccines Other (See Comments)    Pt states that she got really sick from flu vaccine was sick a total of 6 weeks   Paxil [Paroxetine Hcl] Other (See Comments)    fatigue   Tape Rash      MEDICATIONS:  Current Outpatient Medications on File Prior to Visit  Medication Sig Dispense Refill   albuterol (PROVENTIL) (2.5 MG/3ML) 0.083% nebulizer solution Take 3 mLs (2.5 mg total) by nebulization every 6 (six) hours as needed for wheezing or shortness of breath. 150 mL 1   albuterol (VENTOLIN HFA) 108 (90 Base) MCG/ACT inhaler INHALE 1 TO 2 PUFFS INTO THE LUNGS EVERY 6 HOURS AS NEEDED FOR WHEEZING OR SHORTNESS OF BREATH 6.7 g 2   ALPRAZolam (XANAX) 1 MG tablet Take 0.5-1 tablets (0.5-1 mg total) by mouth at bedtime as needed for anxiety. 45 tablet 1   amLODipine (NORVASC) 2.5 MG tablet TAKE 1 TABLET(2.5 MG) BY MOUTH IN THE MORNING AND AT BEDTIME 180 tablet 3    betamethasone dipropionate 0.05 % cream Apply topically 2 (two) times daily. 45 g 1   budesonide-formoterol (SYMBICORT) 160-4.5 MCG/ACT inhaler Inhale 2 puffs into the lungs 2 (two) times daily. 10.2 g 5   diclofenac Sodium (VOLTAREN) 1 % GEL Apply 2 g topically 4 (four) times daily. 100 g 0   Iron, Ferrous Sulfate, 325 (65 Fe) MG TABS Take 325 mg by mouth in the morning, at noon, and at bedtime. 90 tablet 2   Lactobacillus (ACIDOPHILUS PO) Take 2 capsules by mouth daily.      Nebulizers MISC 1 Units by Misc.(Non-Drug; Combo Route) route every 8 (eight) hours as needed.     pantoprazole (PROTONIX) 40 MG tablet TAKE  1 TABLET(40 MG) BY MOUTH DAILY 90 tablet 2   Semaglutide-Weight Management 1.7 MG/0.75ML SOAJ Inject 1.7 mg into the skin once a week. 3 mL 5   sertraline (ZOLOFT) 50 MG tablet Take 1 tablet (50 mg total) by mouth daily. 90 tablet 3   triamterene-hydrochlorothiazide (MAXZIDE) 75-50 MG tablet Take 1 tablet by mouth daily. Reported on 06/12/2015 90 tablet 3   VITAMIN E PO Take by mouth daily.     No current facility-administered medications on file prior to visit.     PAST SURGICAL HISTORY Past Surgical History:  Procedure Laterality Date   ABDOMINAL HYSTERECTOMY Bilateral 02/11/2015   Procedure: HYSTERECTOMY ABDOMINAL, bilateral salpingo-oophorectomy;  Surgeon: Candice Camp, MD;  Location: [facility];  Service: Gynecology;  Laterality: Bilateral;   COLONOSCOPY WITH ESOPHAGOGASTRODUODENOSCOPY (EGD)     DILATATION & CURETTAGE/HYSTEROSCOPY WITH TRUECLEAR N/A 08/17/2013   Procedure: DILATATION & CURETTAGE/HYSTEROSCOPY WITH TRUCLEAR;  Surgeon: Turner Daniels, MD;  Location: [facility];  Service: Gynecology;  Laterality: N/A;   IR REMOVAL TUN ACCESS W/ PORT W/O FL MOD SED  07/05/2017   MELANOMA EXCISION  06/2001   right leg; also removed 2 lymph nodes   OMENTECTOMY N/A 02/11/2015   Procedure: OMENTECTOMY;  Surgeon: Candice Camp, MD;  Location: [facility];  Service: Gynecology;  Laterality: N/A;   UMBILICAL  HERNIA REPAIR      FAMILY HISTORY: Family History  Problem Relation Age of Onset   Asthma Mother    Colon polyps Father    Diabetes Father    Melanoma Maternal Aunt    Heart disease Paternal Uncle    Diabetes Paternal Grandmother    Breast cancer Cousin        maternal first cousin   Colon cancer Neg Hx    Ovarian cancer Neg Hx    Endometrial cancer Neg Hx    Pancreatic cancer Neg Hx    Prostate cancer Neg Hx     SOCIAL HISTORY:  reports that she has never smoked. She has never used smokeless tobacco. She reports that she does not drink alcohol and does not use drugs.  PERFORMANCE STATUS: The patient's performance status is 1 - Symptomatic but completely ambulatory  PHYSICAL EXAM: Most Recent Vital Signs: There were no vitals taken for this visit. BP (!) 141/82 (BP Location: Right Arm, Patient Position: Sitting, Cuff Size: Large)   Pulse 86   Temp 98.3 F (36.8 C) (Oral)   Resp 17   Wt 220 lb (99.8 kg)   LMP  (LMP Unknown)   SpO2 100%   BMI 41.57 kg/m   General Appearance:    Alert, cooperative, no distress, appears stated age  Head:    Normocephalic, without obvious abnormality, atraumatic  Eyes:    PERRL, conjunctiva/corneas clear, EOM's intact, fundi    benign, both eyes        Throat:   Lips, mucosa, and tongue normal; teeth and gums normal  Neck:   Supple, symmetrical, trachea midline, no adenopathy;    thyroid:  no enlargement/tenderness/nodules; no carotid   bruit or JVD  Back:     Symmetric, no curvature, ROM normal, no CVA tenderness  Lungs:     Clear to auscultation bilaterally, respirations unlabored  Chest Wall:    No tenderness or deformity   Heart:    Regular rate and rhythm, S1 and S2 normal, no murmur, rub   or gallop     Abdomen:     Soft, non-tender, bowel sounds active all four quadrants,  no masses, no organomegaly        Extremities:   Extremities normal, atraumatic, no cyanosis, chronic bilateral lower extremity swelling  Pulses:    2+ and symmetric all extremities  Skin:   Skin color, texture, turgor normal, no rashes or lesions  Lymph nodes:   Cervical, supraclavicular, and axillary nodes normal  Neurologic:   CNII-XII intact, normal strength, sensation and reflexes    throughout    LABORATORY DATA:  No results found for this or any previous visit (from the past 48 hours).    RADIOGRAPHY: No results found.     PATHOLOGY: None  ASSESSMENT/PLAN: Ms. Gamino is a pleasant 63 yo caucasian female with recent symptomatic anemia (Hgb 5.7) treated with 2 units of blood in the ED on 05/13/2023.  Iron studies are pending. We can replace with IV iron if needed.  Hgb is stable at 9.7, no transfusion needed at this time.  She will continue her daily oral iron supplement and hold aspirin until we see what GI finds.  Follow-up in 3 months.   All questions were answered. The patient knows to call the clinic with any problems, questions or concerns. We can certainly see the patient much sooner if necessary.  The patient was discussed with and also seen by Dr. Myna Hidalgo and he is in agreement with the aforementioned.   Eileen Stanford, NP

## 2023-06-21 ENCOUNTER — Telehealth: Payer: Self-pay

## 2023-06-21 LAB — ERYTHROPOIETIN: Erythropoietin: 25.9 m[IU]/mL — ABNORMAL HIGH (ref 2.6–18.5)

## 2023-06-21 NOTE — Telephone Encounter (Signed)
Called and LM informing patient of lab results (ok per DPR). Requested a CB with any questions or concerns.

## 2023-06-21 NOTE — Telephone Encounter (Signed)
-----   Message from Eileen Stanford sent at 06/21/2023  2:31 PM EDT ----- Continue oral iron! No IV iron needed right now. She is holding steady!! Lets see what GI says.   Sarah ----- Message ----- From: Leory Plowman, Lab In [place] Sent: 06/20/2023  11:45 AM EDT To: Erenest Blank, NP

## 2023-06-22 ENCOUNTER — Ambulatory Visit: Admitting: Family Medicine

## 2023-06-22 ENCOUNTER — Encounter: Payer: Self-pay | Admitting: Family Medicine

## 2023-06-22 VITALS — BP 136/82 | HR 89 | Ht 61.0 in | Wt 221.0 lb

## 2023-06-22 DIAGNOSIS — S81802A Unspecified open wound, left lower leg, initial encounter: Secondary | ICD-10-CM

## 2023-06-22 MED ORDER — DOXYCYCLINE MONOHYDRATE 100 MG PO CAPS
100.0000 mg | ORAL_CAPSULE | Freq: Two times a day (BID) | ORAL | 0 refills | Status: AC
Start: 2023-06-23 — End: 2023-07-03

## 2023-06-22 MED ORDER — CEFTRIAXONE SODIUM 1 G IJ SOLR
1.0000 g | Freq: Once | INTRAMUSCULAR | Status: AC
  Administered 2023-06-22: 1 g via INTRAMUSCULAR

## 2023-06-22 MED ORDER — FLUCONAZOLE 150 MG PO TABS: ORAL_TABLET | ORAL | 0 refills | Status: DC

## 2023-06-22 MED ORDER — BETAMETHASONE VALERATE 0.1 % EX OINT: 1.0000 | TOPICAL_OINTMENT | Freq: Two times a day (BID) | CUTANEOUS | 2 refills | Status: DC

## 2023-06-22 NOTE — Addendum Note (Signed)
 Addended by: Margo Common on: 06/22/2023 03:40 PM   Modules accepted: Orders

## 2023-06-22 NOTE — Patient Instructions (Signed)
When you do wash it, use only soap and water. Do not vigorously scrub. Keep the area clean and dry.   Things to look out for: increasing pain not relieved by acetaminophen, fevers, spreading redness, drainage of pus, or foul odor.  Let us know if you need anything.

## 2023-06-22 NOTE — Progress Notes (Signed)
 Chief Complaint  Patient presents with   Acute Visit    Patient presents today for skin check.    [name] is a 63 y.o. female here for a skin complaint.  Duration: a few days Location: LLE posteriorly Pruritic? No Painful? Yes Drainage? Yes- clear drainage Trauma? 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  The Custom gene panel offered by [facility] includes sequencing and rearrangement analysis for the following 24 genes:  ATM, BAP1, BARD1, BRCA1, BRCA2, BRIP1, CDH1, CDK4, CDKN2A, CHEK2, EPCAM, FANCC, MITF, MLH1, MSH2, MSH6, NBN, PALB2, PMS2, PTEN, RAD51C, RAD51D, TP53, and XRCC2.   The report date is February 13, 2016.   GERD (gastroesophageal reflux disease)    Heart murmur    History of melanoma excision 03/23/2015   Hypertension    Leukopenia due to antineoplastic chemotherapy (HCC) 04/16/2015   Melanoma (HCC) dx'd 2003   right leg   Morbid obesity with BMI of 50.0-59.9, adult (HCC) 03/23/2015   OPEN WOUND OF KNEE LEG AND ANKLE COMPLICATED 03/10/2009   Qualifier: Diagnosis of  By: Lennox Laity RN, Denise     ovarian ca dx'd 10/216   Seasonal allergies    Umbilical hernia without obstruction and without gangrene 03/23/2015   Urinary, incontinence, stress female 10/27/2016    BP 136/82   Pulse 89   Ht 5\' 1"  (1.549 m)   Wt 221 lb (100.2 kg)   LMP  (LMP Unknown)   SpO2 95%   BMI 41.76 kg/m  Gen: awake, alert, appearing stated age Lungs: No accessory muscle  use Skin: See below. +induration, excoriation, TTP. No drainage, fluctuance Psych: Age appropriate judgment and insight    Wound of left lower extremity, initial encounter - Plan: doxycycline (MONODOX) 100 MG capsule  1 g or Rocephin today, 10 d of doxy starting tomorrow. If not improving, would consider setting up w wound care team. Warning signs and symptoms verbalized and written down in AVS. Ice, Tylenol.  F/u prn. The patient voiced understanding and agreement to the plan.  Jilda Roche [place], DO 06/22/23 2:40 PM

## 2023-06-24 ENCOUNTER — Ambulatory Visit: Payer: Medicare Other | Admitting: Gastroenterology

## 2023-06-24 NOTE — Progress Notes (Signed)
 Chief Complaint:  anemia, possible melena Primary GI Doctor:Dr. 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 Myrtie Neither, who was referred to me by Sharlene Dory* on 05/27/23 for a complaint of anemia, possible melena.    ---06/15/2018 patient last seen in the GI office by Dr. Myrtie Neither for main complaint of perianal bleeding and melena.Small soft tissue lesion gluteal cleft, possibly small pilonidal cyst. This appears to be the source of blood she sees when she wipes that area. Doxycycline 100 mg twice daily for 5 days for skin lesion.   On 05/13/23 Patient seen in ED, referred by her PCP for hemoglobin of 6.3. Denies any sob, denies chest pain, or weakness. States might have noticed some dark colored stools recently.  Patient's hemoglobin was 5.7 at the ED.  She received 2 units of blood and referred to a hematologist for further evaluation.  Iron was prescribed.  Patient also referred to GI for follow-up colonoscopy. Lab work: WBC 4, hemoglobin 5.7, hematocrit 21.7, platelets 436, BUN 22, creatinine 0.90.  Fecal occult negative.  On 05/27/2023 patient seen by PCP.  She reports having intermittent dark/tarry stools.  Follow-up hemoglobin 9.1.  Iron 98.  Ferritin 66.  Patient taking oral iron supplementation.  On 06/20/2023 patient seen by hematologist.  Per note patient has not noted any dark tarry stools.  History of PE and DVT in 2019.  Patient was placed on 2 baby aspirin daily with food.  Patient since then has stopped with recent anemia and will continue to hold until after workup. Hgb is stable at 9.7, no transfusion needed at this time. She will continue her daily oral iron supplement and hold aspirin until we see what GI finds.   Interval History    Patient  presents for follow-up after recent ED visit for anemia and reported melena. Patient states she had noted dark tarry stools that were odorous in mid to late February. She was also very fatigued. She called to make appt with PCP where she was discovered to have low Hgb and referred to ED. She is currently taking iron supplement 1 tablet po daily. Patient has one bowel movement daily, occasional straining while being on Semaglutide. She reports dark tarry stools about 50% of the time. Last episode two days ago. She was on two baby aspirin 81 mg po daily, but discontinued few weeks ago with reported anemia. She reports occasional reflux few times a month. She is currently taking Pantoprazole 40mg  po daily. Patient denies dysphagia. Patient denies nausea or vomiting. She has lost weight being on Semaglutide, reports about 35 lbs. History of Crohn's disease, reports she has not required medications for years. No alcohol use. Nonsmoker. Patient's family history includes father with colonic polyps.   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  The Custom gene panel offered by [facility] includes sequencing and rearrangement analysis for the following 24 genes:  ATM, BAP1, BARD1, BRCA1, BRCA2, BRIP1, CDH1, CDK4, CDKN2A, CHEK2, EPCAM, FANCC, MITF, MLH1, MSH2, MSH6, NBN, PALB2, PMS2, PTEN, RAD51C, RAD51D, TP53, and XRCC2.   The report date is February 13, 2016.    GERD (gastroesophageal reflux disease)    Heart murmur    History of melanoma excision 03/23/2015   Hypertension    Leukopenia due to antineoplastic chemotherapy (HCC) 04/16/2015   Melanoma (HCC) dx'd 2003   right leg   Morbid obesity with BMI of 50.0-59.9, adult (HCC) 03/23/2015   OPEN WOUND OF KNEE LEG AND ANKLE COMPLICATED 03/10/2009   Qualifier: Diagnosis of  By: Lennox Laity RN, Denise     ovarian ca dx'd 10/216   Seasonal allergies    Umbilical hernia without obstruction and without gangrene 03/23/2015   Urinary, incontinence, stress female 10/27/2016    Past Surgical History:  Procedure Laterality Date   ABDOMINAL HYSTERECTOMY Bilateral 02/11/2015   Procedure: HYSTERECTOMY ABDOMINAL, bilateral salpingo-oophorectomy;  Surgeon: Candice Camp, MD;  Location: [facility];  Service: Gynecology;  Laterality: Bilateral;   COLONOSCOPY WITH ESOPHAGOGASTRODUODENOSCOPY (EGD)     DILATATION & CURETTAGE/HYSTEROSCOPY WITH TRUECLEAR N/A 08/17/2013   Procedure: DILATATION & CURETTAGE/HYSTEROSCOPY WITH TRUCLEAR;  Surgeon: Turner Daniels, MD;  Location: [facility];  Service: Gynecology;  Laterality: N/A;   IR REMOVAL TUN ACCESS W/ PORT W/O FL MOD SED  07/05/2017   MELANOMA EXCISION  06/2001   right leg; also removed 2 lymph nodes   OMENTECTOMY N/A 02/11/2015   Procedure: OMENTECTOMY;  Surgeon: Candice Camp, MD;  Location: [facility];  Service: Gynecology;  Laterality: N/A;   UMBILICAL HERNIA REPAIR      Current Outpatient Medications  Medication Sig Dispense Refill   albuterol (PROVENTIL) (2.5 MG/3ML) 0.083% nebulizer solution Take 3 mLs (2.5 mg total) by nebulization every 6 (six) hours as needed for wheezing or shortness of breath. 150 mL 1   albuterol (VENTOLIN HFA) 108 (90 Base) MCG/ACT inhaler INHALE 1 TO 2 PUFFS INTO THE LUNGS EVERY 6 HOURS AS NEEDED FOR WHEEZING OR SHORTNESS OF BREATH 6.7 g 2   ALPRAZolam (XANAX) 1 MG tablet Take 0.5-1 tablets (0.5-1 mg total) by mouth at bedtime as needed for anxiety. 45 tablet 1    amLODipine (NORVASC) 2.5 MG tablet TAKE 1 TABLET(2.5 MG) BY MOUTH IN THE MORNING AND AT BEDTIME 180 tablet 3   betamethasone valerate ointment (VALISONE) 0.1 % Apply 1 Application topically 2 (two) times daily. 45 g 2   budesonide-formoterol (SYMBICORT) 160-4.5 MCG/ACT inhaler Inhale 2 puffs into the lungs 2 (two) times daily. 10.2 g 5   diclofenac Sodium (VOLTAREN) 1 % GEL Apply 2 g topically 4 (four) times daily. 100 g 0   doxycycline (MONODOX) 100 MG capsule Take 1 capsule (100 mg total) by mouth 2 (two) times daily for 10 days. 20 capsule 0   fluconazole (DIFLUCAN) 150 MG tablet Take 1 tab, repeat in 72 hours if no improvement. 2 tablet 0   fluorometholone (FML) 0.1 % ophthalmic suspension Place 1 drop into the left eye 3 (three) times daily.     Iron, Ferrous Sulfate, 325 (65 Fe) MG TABS Take 325 mg by mouth in the morning, at noon, and at bedtime. 90 tablet 2   Lactobacillus (ACIDOPHILUS PO) Take 1 capsule  by mouth daily.     Nebulizers MISC 1 Units by Misc.(Non-Drug; Combo Route) route every 8 (eight) hours as needed.     pantoprazole (PROTONIX) 40 MG tablet TAKE 1 TABLET(40 MG) BY MOUTH DAILY 90 tablet 2   Semaglutide-Weight Management 1.7 MG/0.75ML SOAJ Inject 1.7 mg into the skin once a week. 3 mL 5   sertraline (ZOLOFT) 50 MG tablet Take 1 tablet (50 mg total) by mouth daily. 90 tablet 3   triamterene-hydrochlorothiazide (MAXZIDE) 75-50 MG tablet Take 1 tablet by mouth daily. Reported on 06/12/2015 90 tablet 3   VITAMIN E PO Take by mouth daily.     No current facility-administered medications for this visit.    Allergies as of 06/27/2023 - Review Complete 06/27/2023  Allergen Reaction Noted   Butorphanol tartrate Other (See Comments) 11/11/2020   Levofloxacin Palpitations 09/14/2007   Moxifloxacin Palpitations 09/14/2007   Stadol [butorphanol] Other (See Comments) 08/15/2013   Amoxicillin Rash 03/24/2009   Asa [aspirin] Other (See Comments) 05/08/2015   Ciprofloxacin hcl Nausea  And Vomiting 01/30/2015   Clindamycin/lincomycin Nausea And Vomiting 08/09/2013   Codeine Nausea And Vomiting 09/14/2007   Effexor [venlafaxine] Other (See Comments) 08/09/2013   Erythromycin Diarrhea and Nausea And Vomiting 12/09/2015   Latex Rash 04/25/2015   Losartan Nausea Only and Other (See Comments) 08/09/2013   Paroxetine Other (See Comments) 12/09/2015   Cephalosporins Other (See Comments) 03/10/2009   Clarithromycin Diarrhea 12/09/2015   Clindamycin Diarrhea 12/13/2012   Influenza vaccines Other (See Comments) 07/06/2016   Paxil [paroxetine hcl] Other (See Comments) 08/09/2013   Tape Rash 04/25/2015    Family History  Problem Relation Age of Onset   Asthma Mother    Colon polyps Father    Diabetes Father    Melanoma Maternal Aunt    Heart disease Paternal Uncle    Diabetes Paternal Grandmother    Breast cancer Cousin        maternal first cousin   Colon cancer Neg Hx    Ovarian cancer Neg Hx    Endometrial cancer Neg Hx    Pancreatic cancer Neg Hx    Prostate cancer Neg Hx     Review of Systems:    Constitutional: No weight loss, fever, chills, weakness or fatigue HEENT: Eyes: No change in vision               Ears, Nose, Throat:  No change in hearing or congestion Skin: No rash or itching Cardiovascular: No chest pain, chest pressure or palpitations   Respiratory: No SOB or cough Gastrointestinal: See HPI and otherwise negative Genitourinary: No dysuria or change in urinary frequency Neurological: No headache, dizziness or syncope Musculoskeletal: No new muscle or joint pain Hematologic: No bleeding or bruising Psychiatric: No history of depression or anxiety    Physical Exam:  Vital signs: BP 126/72   Pulse 70   Ht 5\' 1"  (1.549 m)   Wt 220 lb (99.8 kg)   LMP  (LMP Unknown)   BMI 41.57 kg/m   Constitutional: Pleasant  female appears to be in NAD, Well developed, Well nourished, alert and cooperative Throat: Oral cavity and pharynx without  inflammation, swelling or lesion.  Respiratory: Respirations even and unlabored. Lungs clear to auscultation bilaterally.   No wheezes, crackles, or rhonchi.  Cardiovascular: Normal S1, S2. Regular rate and rhythm. No peripheral edema, cyanosis or pallor.  Gastrointestinal:  Soft, nondistended, nontender. Obese No rebound or guarding. Normal bowel sounds. No appreciable masses or hepatomegaly. Rectal:  Not performed.  Msk:  Symmetrical without gross deformities. Without edema, no deformity or joint abnormality.  Neurologic:  Alert and  oriented x4;  grossly normal neurologically. Uses cane. Skin:   Dry and intact without significant lesions or rashes. Psychiatric: Oriented to person, place and time. Demonstrates good judgement and reason without abnormal affect or behaviors.  RELEVANT LABS AND IMAGING: CBC    Latest Ref Rng & Units 06/20/2023   10:58 AM 05/27/2023   11:02 AM 05/12/2023    2:55 PM  CBC  WBC 4.0 - 10.5 K/uL 4.3  4.8  4.8   Hemoglobin 12.0 - 15.0 g/dL 9.7  9.1  6.3 Repeated and verified X2.   Hematocrit 36.0 - 46.0 % 32.3  29.4  22.1 Repeated and verified X2.   Platelets 150 - 400 K/uL 372  463.0  468.0      CMP     Latest Ref Rng & Units 06/20/2023   10:58 AM 05/11/2023   10:58 AM 09/29/2022   10:30 AM  CMP  Glucose 70 - 99 mg/dL 81  86  469   BUN 8 - 23 mg/dL 20  21  24    Creatinine 0.44 - 1.00 mg/dL 6.29  5.28  4.13   Sodium 135 - 145 mmol/L 133  133  135   Potassium 3.5 - 5.1 mmol/L 4.2  4.5  4.3   Chloride 98 - 111 mmol/L 102  103  101   CO2 22 - 32 mmol/L 24  23  24    Calcium 8.9 - 10.3 mg/dL 9.2  9.0  9.0   Total Protein 6.5 - 8.1 g/dL 8.4  7.5    Total Bilirubin 0.0 - 1.2 mg/dL 0.5  0.5    Alkaline Phos 38 - 126 U/L 68  74    AST 15 - 41 U/L 25  23    ALT 0 - 44 U/L 12  12       Lab Results  Component Value Date   TSH 1.91 02/14/2019   Lab Results  Component Value Date   IRON 46 06/20/2023   TIBC 314 06/20/2023   FERRITIN 31 06/20/2023  05/27/2023  labs show: Iron 98, TIBC 299, ferritin 66  Procedures: 10/01/20 colonoscopy for diverticulosis with Dr. Luvenia Redden at [facility] Multiple large moderate localized diverticula containing stool and causing  severe luminal narrowing (traversable) with no bleeding in the sigmoid colon. 10/01/20 EGD for [facility] and dysphagia With Dr. Brennan Bailey Normal. All observed locations appeared normal. Possible early GEJ stricture vs  spasm. No dilation needed. Small hiatal hernia. 05/11/19 colonoscopy with Dr. Mariana Arn at [facility] Findings:  Moderate, localized atrophic mucosa in the cecum and proximal ascending  colon; performed cold forceps biopsy  Normal appearing appendix orifice  Normal appearing appendix orifice  The terminal ileum appeared normal.  The rectum appeared normal.  Few small mild diverticula containing no content with no bleeding in the  sigmoid colon. 05/18/11 colonoscopy with Dr. Juanda Chance Endoscopic impression: No polyps or cancer Diverticula, scattered in the sigmoid colon Otherwise normal examination No evidence of active Crohn's disease 02/20/09 colonoscopy with Dr. Juanda Chance ENDOSCOPIC IMPRESSION:     1) No polyps or cancers     2) Normal terminal ileum     3) Normal colonoscopy 10/05/2007 EGD with Dr. Juanda Chance no evidence of es. stricture, acute gastritis, plecement of Small bowl capsule 10/05/07 capsule endoscopy 1) COMPLETE STUDY ,CAPSULE ENDOSCOPICALLY PLACED INTO DUODENUM 2) NO FINDINGS SUGGESTIVE OF CROHNS SEEN,BUT POOR VISUALIZATION BEYOND 3 HOURS.  11/10/2006 colonoscopy with Dr. Juanda Chance Colonoscopy Normal examination Normal exam to terminal ileum, s/p biopsies of TI and of colon.   Currently no endoscopic evidence of Crohn's disease but cannot rule out disease up in the small bowel. 08/23/2005 colonoscopy with Dr. Gaylyn Lambert Impression Ileal ulcerations most consistent with Crohn's although nonsteroidal enteritis remains a possibility based on the appearance.  Patient's  history does not support a history of nonsteroidal use.  Chronic infections ileitis is less likely. 11/05/2002 colonoscopy with Dr. Vernell Barrier Impression ileal erosions, possibly indicate of IBD.  Also consider nonsteroidal enteritis less likely chronic infectious enterocolitis since stool culture has been negative in the past, infectious colitis is considerably less likely.  Imaging:  08/27/19-echo-  Left ventricular ejection fraction, by estimation, is 60 to 65%.  05/13/2023 CT angio IMPRESSION: 1. No central pulmonary embolus. 2. No pulmonary infiltrate. 3. Large hiatal hernia. Splenomegaly. 05/13/2023 CT abdomen pelvis with IV contrast FINDINGS: Large hiatal hernia. Mild volume loss in the left lung base. Liver and spleen normal. Portal circulation patent. Portal vein enlarged at 2.1 cm which can be seen with portal hypertension. Gallbladder and pancreas normal. Adrenal glands normal. No solid or cystic renal lesion. No hydronephrosis. Urinary bladder unremarkable. Prior hysterectomy. No bowel obstruction. Normal appendix in the right lower quadrant. Mild left colon diverticulosis. No free fluid. No abdominal wall hernia. Mild atherosclerotic plaque. No AAA. No adenopathy. Moderate to severe lumbar spondylosis.  Impression:  IMPRESSION: No acute findings identified in the abdomen/pelvis.  09/16/20 CT A/P W IV contrast IMPRESSION:  1. Fatty liver.  2. Stable moderate sized hiatal hernia.  3. A few diverticula sigmoid colon.   Assessment: Encounter Diagnoses  Name Primary?   Iron deficiency anemia, unspecified iron deficiency anemia type Yes   Crohn's disease involving terminal ileum (HCC)    Melena    Large hiatal hernia    Gastroesophageal reflux disease, unspecified whether esophagitis present    63 year old female patient that presents for evaluation of iron deficiency anemia with reports of dark tarry stools since mid February.  Patient was on 2 baby aspirin's per day for history  of PE/DVT.Currently on hold.  Patient also has history of questionable Crohn's of the terminal ileum, however has not had ileal erosions on exam since colonoscopy in 2007.  It was most consistent with Crohn's disease however nonsteroidal enteritis was also a possibility based on appearance.  Patient has not been on any medication.  Patient is also followed by hematology who is managing her iron.  She presents today stating she is still having dark tarry stools 50% of the time.  Hemoglobin is stable at 9.7.  Records show she had both EGD and colonoscopy in June 2022 for hiatal hernia with dysphagia and diverticular disease at [facility]. Will discuss case with Dr. Myrtie Neither. Suspect she will need at least a endoscopy and possibly colonoscopy.   Plan: -Continue Pantoprazole 40mg  po daily -Possible endoscopic procedures with Dr. Myrtie Neither in [facility]. Will discuss her case with him/ -Continue Oral iron 1 tablet po daily -Continue to follow hematologist for IV iron as needed   Thank you for the courtesy of this consult. Please call me with any questions or concerns.   Delena Casebeer, FNP-C [facility] 06/27/2023, 3:25 PM  Cc: Sharlene Dory*

## 2023-06-27 ENCOUNTER — Encounter: Payer: Self-pay | Admitting: Gastroenterology

## 2023-06-27 ENCOUNTER — Ambulatory Visit: Payer: Medicare Other | Admitting: Gastroenterology

## 2023-06-27 VITALS — BP 126/72 | HR 70 | Ht 61.0 in | Wt 220.0 lb

## 2023-06-27 DIAGNOSIS — K5000 Crohn's disease of small intestine without complications: Secondary | ICD-10-CM | POA: Diagnosis not present

## 2023-06-27 DIAGNOSIS — K921 Melena: Secondary | ICD-10-CM

## 2023-06-27 DIAGNOSIS — D509 Iron deficiency anemia, unspecified: Secondary | ICD-10-CM | POA: Diagnosis not present

## 2023-06-27 DIAGNOSIS — K449 Diaphragmatic hernia without obstruction or gangrene: Secondary | ICD-10-CM | POA: Diagnosis not present

## 2023-06-27 DIAGNOSIS — K219 Gastro-esophageal reflux disease without esophagitis: Secondary | ICD-10-CM | POA: Diagnosis not present

## 2023-06-27 NOTE — Patient Instructions (Signed)
 We will call you to schedule an endoscopy/colonoscopy  _______________________________________________________  If your blood pressure at your visit was 140/90 or greater, please contact your primary care physician to follow up on this.  _______________________________________________________  If you are age 63 or older, your body mass index should be between 23-30. Your Body mass index is 41.57 kg/m. If this is out of the aforementioned range listed, please consider follow up with your Primary Care Provider.  If you are age 47 or younger, your body mass index should be between 19-25. Your Body mass index is 41.57 kg/m. If this is out of the aformentioned range listed, please consider follow up with your Primary Care Provider.   ________________________________________________________  The [facility] providers would like to encourage you to use [facility] to communicate with providers for non-urgent requests or questions.  Due to long hold times on the telephone, sending your provider a message by [facility] may be a faster and more efficient way to get a response.  Please allow 48 business hours for a response.  Please remember that this is for non-urgent requests.  _______________________________________________________

## 2023-06-29 NOTE — Progress Notes (Signed)
 ____________________________________________________________  Attending physician addendum:  Thank you for sending this case to me. I have reviewed the entire note and agree with the plan.  I agree that she needs both an EGD and colonoscopy, and the upper endoscopy sounds more pressing given the reported symptoms. Both procedures cannot be performed on the same day with the current endoscopic availability for me or one of my partners, even if the staffing will allow a 730 start on any upcoming procedure block. My recommendation is that we get her scheduled for an EGD on the next available date when I can make a 730 start and a colonoscopy on a subsequent day when I can make a 730 start.  Please also note she would need to hold her GLP-1 agonist medicine at least a week before the procedure, which will affect the available dates for this plan.  Amada Jupiter, MD  ____________________________________________________________

## 2023-06-30 ENCOUNTER — Telehealth: Payer: Self-pay

## 2023-06-30 ENCOUNTER — Telehealth: Payer: Self-pay | Admitting: Gastroenterology

## 2023-06-30 NOTE — Telephone Encounter (Signed)
-----   Message from Margarite Gouge May sent at 06/29/2023  7:15 AM EDT ----- Regarding: follow-up Verlon Au, Good morning, I heard back from Dr. Myrtie Neither on that patient from Monday- this was his recommendations:    I agree that she needs both an EGD and colonoscopy, and the upper endoscopy sounds more pressing given the reported symptoms.  Both procedures cannot be performed on the same day with the current endoscopic availability for me or one of my partners, even if the staffing will allow a 730 start on any upcoming procedure block. My recommendation is that we get her scheduled for an EGD on the next available date when I can make a 730 start and a colonoscopy on a subsequent day when I can make a 730 start.  Please also note she would need to hold her GLP-1 agonist medicine at least a week before the procedure, which will affect the available dates for this plan. Can you help with this?  If you have any questions let me know.   thank you,  Deanna

## 2023-06-30 NOTE — Telephone Encounter (Signed)
 Spoke to patient and relayed Dr. Myrtie Neither' recommendations.  She was adamant that she wanted to do both procedures together is not at all open to doing then separately.  She was also adamant that it be early in the morning.  I told her Dr. Myrtie Neither did not have a double early in the morning until May.  This was not acceptable and she asked that I just put her with someone else.  I told her I could not do this without Dr. Myrtie Neither' permission.  I told her I would get with him about her concerns and then call her back..  Patient agreed.

## 2023-06-30 NOTE — Telephone Encounter (Signed)
 Patient called and stated that she was last seen here with Deanna May and was told that she would receive a call to schedule a Endo and colonoscopy. Patient stated that she has not heard anything from [place] and was wanting to know when she will get a call. Patient is requesting a call back today if possible. Please advise.

## 2023-06-30 NOTE — Telephone Encounter (Signed)
 Left message for patient to call back

## 2023-06-30 NOTE — Telephone Encounter (Addendum)
-----   Message from [facility] [place] W sent at 06/30/2023  3:53 PM EDT ----- While I waited to hear back from you about the Miralax prep, I called the patient to at least schedule the egd.  It did not go well.  She has a list of demands, some of which she mentioned last Monday when she was here, some she mentioned today.  1.  She wants someone who has been here years and years, anyone will do, if they can do it as soon as she wants. 2.  She wants someone who doesn't speak with an accent she can't understand. 3. She wants someone nice. 4. She wants first thing in the morning. 5.  She can only use the MIralax prep. 6.  She absolutely does not want to do the procedures separately, they must be done together. 7.  She does not want to wait until May which is your first double that's early in the morning.   I told her I would have to get with you to see what I could do and would call her back. ___________________  [name],  Thank you for the staff message which I have copied to follow-up for documentation of your phone interaction with this patient.  I am agreeable to a MiraLAX along with the Dulcolax we typically give before the evening dose.  As for her requirements of both procedure to be done on the same day in the timeframe in which she is requesting, that is not feasible given the current schedules and availability of this entire practice.  So my offer remains the same as outlined in my addendum to Deanna's note.  [name] can decide to accept that or seek another opinion elsewhere.  Ellwood Dense MD

## 2023-07-01 ENCOUNTER — Encounter: Payer: Self-pay | Admitting: Gastroenterology

## 2023-07-01 ENCOUNTER — Telehealth: Payer: Self-pay | Admitting: Emergency Medicine

## 2023-07-01 NOTE — Telephone Encounter (Signed)
 Based on most recent labs, 2, which is quite good for someone of her maturity. It's more of a range rather than actual kidney disease at this level. Ty.

## 2023-07-01 NOTE — Telephone Encounter (Signed)
 Copied from CRM [id number]. Topic: Clinical - Medical Advice >> Jul 01, 2023 10:29 AM Elizebeth Brooking wrote: Reason for CRM: Patient would like a nurse to give her a callback regarding her last physical exam with her and explain all of her labs to her.

## 2023-07-01 NOTE — Telephone Encounter (Signed)
 Inbound call from patient requesting to speak with Verlon Au, please advise.

## 2023-07-01 NOTE — Telephone Encounter (Signed)
 Pt wants to know what stage kidney disease she is in now

## 2023-07-01 NOTE — Telephone Encounter (Signed)
 Lm on vm outlining her options per Dr. Myrtie Neither (TE dated 06/30/23).  I asked her to decide what she would like to do and then call back to let us know.

## 2023-07-04 ENCOUNTER — Other Ambulatory Visit: Payer: Self-pay | Admitting: Family Medicine

## 2023-07-05 DIAGNOSIS — L931 Subacute cutaneous lupus erythematosus: Secondary | ICD-10-CM | POA: Diagnosis not present

## 2023-07-05 DIAGNOSIS — L308 Other specified dermatitis: Secondary | ICD-10-CM | POA: Diagnosis not present

## 2023-07-05 NOTE — Telephone Encounter (Signed)
 Called patient and no answer left vm to return call

## 2023-07-07 NOTE — Telephone Encounter (Signed)
 Can be discussed at appt on 07/12/23

## 2023-07-12 ENCOUNTER — Encounter: Payer: Self-pay | Admitting: Family Medicine

## 2023-07-12 ENCOUNTER — Ambulatory Visit (INDEPENDENT_AMBULATORY_CARE_PROVIDER_SITE_OTHER): Admitting: Family Medicine

## 2023-07-12 VITALS — BP 132/86 | HR 78 | Temp 97.6°F | Ht 61.0 in | Wt 219.4 lb

## 2023-07-12 DIAGNOSIS — S81802D Unspecified open wound, left lower leg, subsequent encounter: Secondary | ICD-10-CM | POA: Diagnosis not present

## 2023-07-12 DIAGNOSIS — M1991 Primary osteoarthritis, unspecified site: Secondary | ICD-10-CM

## 2023-07-12 MED ORDER — SERTRALINE HCL 50 MG PO TABS: 50.0000 mg | ORAL_TABLET | Freq: Every day | ORAL | 3 refills | Status: DC

## 2023-07-12 MED ORDER — CELECOXIB 100 MG PO CAPS: 100.0000 mg | ORAL_CAPSULE | Freq: Two times a day (BID) | ORAL | 1 refills | Status: DC | PRN

## 2023-07-12 NOTE — Patient Instructions (Signed)
 This looks good. We don't need any more antibiotics.   Let me know if we don't continue to turn the corner.   When you do wash it, use only soap and water. Do not vigorously scrub. Apply triple antibiotic ointment (like Neosporin) twice daily. Keep the area clean and dry.   Things to look out for: increasing pain not relieved by ibuprofen/acetaminophen, fevers, spreading redness, drainage of pus, or foul odor.  Let us know if you need anything.

## 2023-07-12 NOTE — Progress Notes (Signed)
 Chief Complaint  Patient presents with   Acute Visit    Patient presents today for a follow-up on left lower leg wound.    [name] is a 63 y.o. female here for a skin complaint.  Duration:  3.5  weeks Location: Posterior right lower extremity Pruritic? No Painful? No Drainage? Yes-clear Trauma? No Other associated symptoms: Redness has improved since treatment with Rocephin and doxycycline.  No fevers. Therapies tried thus far: Rocephin, doxycycline  Patient has a longstanding history of osteoarthritis and is wondering what she can take.  She has tried Tylenol and Voltaren gel without significant relief.  No recent injury or change in activity.  She is currently on semaglutide which is helping her lose weight.  This has subsequently helped with her pain as well.  As the FDA is no longer allowing compounded semaglutide, she is worried she will gain weight back and is wondering if it is anything she can take to help with her arthritis.  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  The Custom gene panel offered by [facility] includes sequencing and rearrangement analysis for the following 24 genes:  ATM, BAP1, BARD1, BRCA1, BRCA2, BRIP1, CDH1, CDK4, CDKN2A, CHEK2, EPCAM, FANCC, MITF, MLH1, MSH2, MSH6, NBN, PALB2, PMS2, PTEN, RAD51C, RAD51D, TP53, and XRCC2.   The report date is February 13, 2016.   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   Melanoma (HCC) dx'd 2003   right leg   Morbid obesity with BMI of 50.0-59.9, adult (HCC) 03/23/2015   OPEN WOUND OF KNEE LEG AND ANKLE COMPLICATED 03/10/2009   Qualifier: Diagnosis of  By: Lennox Laity RN, Denise     ovarian ca dx'd 10/216   Seasonal allergies    Umbilical hernia without obstruction and without gangrene 03/23/2015   Urinary, incontinence, stress female 10/27/2016    BP 132/86   Pulse 78   Temp 97.6 F (36.4 C)   Ht 5\' 1"  (1.549 m)   Wt 219 lb 6.4 oz (99.5 kg)   LMP  (LMP Unknown)   SpO2 99%   BMI 41.46 kg/m  Gen: awake, alert, appearing stated age Lungs: No accessory muscle use Skin: See below. + Induration.  No drainage, erythema, TTP, fluctuance Psych: Age appropriate judgment and insight    Open wound of left lower extremity, subsequent encounter  Primary osteoarthritis, unspecified site - Plan: celecoxib (CELEBREX) 100 MG capsule  Improving.  No need for further antibiotic therapy.  TAO twice daily for the next several weeks.  She will let me know if things worsen.  Could consider referral to the wound care team. Chronic, uncontrolled.  Start Celebrex to 100 mg twice daily as needed.  Tylenol, heat, ice, stretches and exercises. F/u as originally scheduled. The patient voiced understanding and agreement to the plan.  Jilda Roche [place], DO 07/12/23 11:51 AM

## 2023-07-15 ENCOUNTER — Telehealth: Payer: Self-pay

## 2023-07-15 NOTE — Telephone Encounter (Signed)
 Called patient and no answer left message to return call.

## 2023-07-15 NOTE — Telephone Encounter (Signed)
 Triple antibiotic ointment? It's available over the counter.

## 2023-07-15 NOTE — Telephone Encounter (Signed)
 Copied from CRM [id number]. Topic: Clinical - Prescription Issue >> Jul 15, 2023  8:50 AM Kathryne Eriksson wrote: Reason for CRM: Ointment / Cream >> Jul 15, 2023  8:53 AM Kathryne Eriksson wrote: Patient states she was supposed to receive an ointment / cream for a leg wound prior to her last visit, yet it hasn't been called into the pharmacy. Patient would like to receive a call once the pharmacy has been sent, patient call back number is [phone]

## 2023-07-18 ENCOUNTER — Telehealth: Payer: Self-pay | Admitting: Emergency Medicine

## 2023-07-18 NOTE — Telephone Encounter (Signed)
 Patient called & stated she missed a call. But advise.

## 2023-07-18 NOTE — Telephone Encounter (Signed)
 Copied from CRM [id number]. Topic: Clinical - Medication Question >> Jul 18, 2023  4:44 PM [name] wrote: Reason for CRM: Patient states she can't use triple antibiotic ointment, needs something stronger, does not want the ointment that was given to her for her hands, please call [phone]

## 2023-07-18 NOTE — Telephone Encounter (Signed)
 Called patient and no answer left message to return call.

## 2023-07-19 ENCOUNTER — Encounter: Payer: Self-pay | Admitting: Oncology

## 2023-07-19 DIAGNOSIS — L931 Subacute cutaneous lupus erythematosus: Secondary | ICD-10-CM | POA: Diagnosis not present

## 2023-07-19 DIAGNOSIS — L930 Discoid lupus erythematosus: Secondary | ICD-10-CM | POA: Diagnosis not present

## 2023-07-19 NOTE — Telephone Encounter (Signed)
 Patient returned call and was advised by E2C2.

## 2023-07-25 ENCOUNTER — Encounter: Payer: Self-pay | Admitting: Oncology

## 2023-07-25 ENCOUNTER — Ambulatory Visit (AMBULATORY_SURGERY_CENTER)

## 2023-07-25 VITALS — Ht 61.0 in | Wt 219.0 lb

## 2023-07-25 DIAGNOSIS — K921 Melena: Secondary | ICD-10-CM

## 2023-07-25 DIAGNOSIS — K449 Diaphragmatic hernia without obstruction or gangrene: Secondary | ICD-10-CM

## 2023-07-25 DIAGNOSIS — K219 Gastro-esophageal reflux disease without esophagitis: Secondary | ICD-10-CM

## 2023-07-25 DIAGNOSIS — D509 Iron deficiency anemia, unspecified: Secondary | ICD-10-CM

## 2023-07-25 DIAGNOSIS — K5000 Crohn's disease of small intestine without complications: Secondary | ICD-10-CM

## 2023-07-25 NOTE — Progress Notes (Signed)
 No egg or soy allergy known to patient  No issues known to pt with past sedation with any surgeries or procedures Patient denies ever being told they had issues or difficulty with intubation  No FH of Malignant Hyperthermia Pt is not on diet pills Pt is not on  home 02  Pt is not on blood thinners  Pt reports mild constipation  No A fib or A flutter Have any cardiac testing pending-- no  LOA: independent  Prep: suprep   Patient's chart reviewed by Rogena Class CNRA prior to previsit and patient appropriate for the [facility].  Previsit completed and red dot placed by patient's name on their procedure day (on provider's schedule).     PV completed with patient. Prep instructions sent via mychart and home address.    ONCE A WEEK INJECTIONS Ozempic,  Mounjaro, Wegovy, Trulicity, Tanzeum, Byetta, Victoza, Bydureon, & SymlinPen  -DO NOT TAKE 7 days prior to the procedure.  Last dose on or before Sunday 5/4 failure to hold this medication will result in a cancellation or rescheduling of your procedure

## 2023-07-25 NOTE — Patient Instructions (Signed)
 ONCE A WEEK INJECTIONS Ozempic,  Mounjaro, Wegovy, Trulicity, Tanzeum, Byetta, Victoza, Bydureon, & SymlinPen  -DO NOT TAKE 7 days prior to the procedure.  Last dose on or before Sunday 5/4 failure to hold this medication will result in a cancellation or rescheduling of your procedure

## 2023-07-28 ENCOUNTER — Telehealth: Payer: Self-pay

## 2023-07-28 NOTE — Telephone Encounter (Signed)
 Copied from CRM [id number]. Topic: Clinical - Lab/Test Results >> Jul 28, 2023  3:13 PM Kita Perish H wrote: Reason for CRM: Patient is calling to have copies of her CBC blood work done at her physical sent to [facility] Dr. Marcia Setters [phone] patient doesn't have fax number.  Mashelle [phone]  Called patient and advised her CBC was faxed to Dr. Drusilla Gerlach w/ [facility] dermatology.

## 2023-08-01 ENCOUNTER — Other Ambulatory Visit: Payer: Self-pay | Admitting: Family Medicine

## 2023-08-10 ENCOUNTER — Encounter: Payer: Self-pay | Admitting: Oncology

## 2023-08-10 ENCOUNTER — Encounter: Payer: Self-pay | Admitting: Gastroenterology

## 2023-08-22 ENCOUNTER — Other Ambulatory Visit: Payer: Self-pay

## 2023-08-22 ENCOUNTER — Encounter: Payer: Self-pay | Admitting: Gastroenterology

## 2023-08-22 ENCOUNTER — Ambulatory Visit (AMBULATORY_SURGERY_CENTER): Admitting: Gastroenterology

## 2023-08-22 ENCOUNTER — Other Ambulatory Visit (INDEPENDENT_AMBULATORY_CARE_PROVIDER_SITE_OTHER)

## 2023-08-22 VITALS — BP 110/68 | HR 70 | Temp 98.4°F | Resp 18 | Ht 61.0 in | Wt 219.6 lb

## 2023-08-22 DIAGNOSIS — Q399 Congenital malformation of esophagus, unspecified: Secondary | ICD-10-CM

## 2023-08-22 DIAGNOSIS — K219 Gastro-esophageal reflux disease without esophagitis: Secondary | ICD-10-CM

## 2023-08-22 DIAGNOSIS — D509 Iron deficiency anemia, unspecified: Secondary | ICD-10-CM

## 2023-08-22 DIAGNOSIS — K317 Polyp of stomach and duodenum: Secondary | ICD-10-CM

## 2023-08-22 DIAGNOSIS — K2950 Unspecified chronic gastritis without bleeding: Secondary | ICD-10-CM

## 2023-08-22 DIAGNOSIS — K921 Melena: Secondary | ICD-10-CM

## 2023-08-22 DIAGNOSIS — K449 Diaphragmatic hernia without obstruction or gangrene: Secondary | ICD-10-CM | POA: Diagnosis not present

## 2023-08-22 DIAGNOSIS — K2951 Unspecified chronic gastritis with bleeding: Secondary | ICD-10-CM | POA: Diagnosis not present

## 2023-08-22 DIAGNOSIS — K5730 Diverticulosis of large intestine without perforation or abscess without bleeding: Secondary | ICD-10-CM

## 2023-08-22 DIAGNOSIS — K3189 Other diseases of stomach and duodenum: Secondary | ICD-10-CM | POA: Diagnosis not present

## 2023-08-22 LAB — CBC WITH DIFFERENTIAL/PLATELET
Basophils Absolute: 0 10*3/uL (ref 0.0–0.1)
Basophils Relative: 0.7 % (ref 0.0–3.0)
Eosinophils Absolute: 0 10*3/uL (ref 0.0–0.7)
Eosinophils Relative: 0.4 % (ref 0.0–5.0)
HCT: 36.1 % (ref 36.0–46.0)
Hemoglobin: 11.8 g/dL — ABNORMAL LOW (ref 12.0–15.0)
Lymphocytes Relative: 13.6 % (ref 12.0–46.0)
Lymphs Abs: 0.7 10*3/uL (ref 0.7–4.0)
MCHC: 32.7 g/dL (ref 30.0–36.0)
MCV: 86.5 fl (ref 78.0–100.0)
Monocytes Absolute: 0.3 10*3/uL (ref 0.1–1.0)
Monocytes Relative: 7 % (ref 3.0–12.0)
Neutro Abs: 3.9 10*3/uL (ref 1.4–7.7)
Neutrophils Relative %: 78.3 % — ABNORMAL HIGH (ref 43.0–77.0)
Platelets: 355 10*3/uL (ref 150.0–400.0)
RBC: 4.18 Mil/uL (ref 3.87–5.11)
RDW: 13.6 % (ref 11.5–15.5)
WBC: 5 10*3/uL (ref 4.0–10.5)

## 2023-08-22 LAB — IBC + FERRITIN
Ferritin: 34.1 ng/mL (ref 10.0–291.0)
Iron: 42 ug/dL (ref 42–145)
Saturation Ratios: 13 % — ABNORMAL LOW (ref 20.0–50.0)
TIBC: 322 ug/dL (ref 250.0–450.0)
Transferrin: 230 mg/dL (ref 212.0–360.0)

## 2023-08-22 MED ORDER — SODIUM CHLORIDE 0.9 % IV SOLN: 500.0000 mL | Freq: Once | INTRAVENOUS | Status: DC

## 2023-08-22 NOTE — Op Note (Signed)
 [facility] Patient Name: [name] Procedure Date: 08/22/2023 9:16 AM MRN: [id number] Endoscopist: Ace Abu L. Dominic Friendly , MD, [id number] Age: 63 Referring MD:  Date of Birth: 07-Oct-1960 Gender: Female Account #: [id number] Procedure:                Upper GI endoscopy Indications:              Unexplained iron  deficiency anemia, Melena, Hiatal                            hernia Medicines:                Monitored Anesthesia Care Procedure:                Pre-Anesthesia Assessment:                           - Prior to the procedure, a History and Physical                            was performed, and patient medications and                            allergies were reviewed. The patient's tolerance of                            previous anesthesia was also reviewed. The risks                            and benefits of the procedure and the sedation                            options and risks were discussed with the patient.                            All questions were answered, and informed consent                            was obtained. Prior Anticoagulants: The patient has                            taken no anticoagulant or antiplatelet agents. ASA                            Grade Assessment: III - A patient with severe                            systemic disease. After reviewing the risks and                            benefits, the patient was deemed in satisfactory                            condition to undergo the procedure.  After obtaining informed consent, the endoscope was                            passed under direct vision. Throughout the                            procedure, the patient's blood pressure, pulse, and                            oxygen saturations were monitored continuously. The                            Olympus Scope [id number] was introduced through the                            mouth, and advanced to the second part  of duodenum.                            The upper GI endoscopy was accomplished without                            difficulty. The patient tolerated the procedure                            well. Scope In: Scope Out: Findings:                 A 10 cm hiatal hernia was present. (Axial and                            transverse dimensions)                           The lower third of the esophagus was mildly                            tortuous. (As a result of the hernia) No                            esophagitis or stricture seen.                           Multiple diminutive erosions with no bleeding and                            no stigmata of recent bleeding were found in the                            gastric fundus/proximal body (within the hernia                            sac). Biopsies were taken from the antrum and body                            with a cold  forceps for histology. (Both areas in                            one pathology jar-rule out H. pylori)                           The exam of the stomach was otherwise normal.                           Mild mucosal changes characterized by scalloping                            were found in the second portion of the duodenum.                            Biopsies for histology were taken with a cold                            forceps for evaluation of celiac disease. (Jar 2)                           The exam of the duodenum was otherwise normal.                           A single 8 mm sessile polyp with no stigmata of                            recent bleeding was found in the cardia. Biopsies                            were taken with a cold forceps for histology. (Jar                            3) Complications:            No immediate complications. Estimated Blood Loss:     Estimated blood loss was minimal. Impression:               - 10 cm hiatal hernia.                           - Tortuous esophagus.                            - Gastric erosions with no bleeding and no stigmata                            of recent bleeding. Biopsied.                           - Mucosal changes in the duodenum. Biopsied.                           - A single gastric polyp. Biopsied.  Melena resolved. Patient discontinued aspirin. May                            have had peptic ulcer and/or more distal small                            bowel source of GI blood loss. Recommendation:           - Patient has a contact number available for                            emergencies. The signs and symptoms of potential                            delayed complications were discussed with the                            patient. Return to normal activities tomorrow.                            Written discharge instructions were provided to the                            patient.                           - Resume previous diet.                           - Continue present medications.                           - Await pathology results.                           - See the other procedure note for documentation of                            additional recommendations.                           - CBC and iron  studies within the next week.                           Office follow-up will be scheduled for further                            evaluation and management of the anemia.                           Remain off aspirin at this time. Alberta Cairns L. Dominic Friendly, MD 08/22/2023 10:02:46 AM This report has been signed electronically.

## 2023-08-22 NOTE — Op Note (Signed)
  [facility] Patient Name: [name] Procedure Date: 08/22/2023 9:15 AM MRN: [id number] Endoscopist: [name] , MD, [id number] Age: 63 Referring MD:  Date of Birth: 1960/12/21 Gender: Female Account #: [id number] Procedure:                Colonoscopy Indications:              Melena, Unexplained iron  deficiency anemia Medicines:                Monitored Anesthesia Care Procedure:                Pre-Anesthesia Assessment:                           - Prior to the procedure, a History and Physical                            was performed, and patient medications and                            allergies were reviewed. The patient's tolerance of                            previous anesthesia was also reviewed. The risks                            and benefits of the procedure and the sedation                            options and risks were discussed with the patient.                            All questions were answered, and informed consent                            was obtained. Prior Anticoagulants: The patient has                            taken no anticoagulant or antiplatelet agents. ASA                            Grade Assessment: III - A patient with severe                            systemic disease. After reviewing the risks and                            benefits, the patient was deemed in satisfactory                            condition to undergo the procedure.                           After obtaining informed consent, the colonoscope  was passed under direct vision. Throughout the                            procedure, the patient's blood pressure, pulse, and                            oxygen saturations were monitored continuously. The                            CF [id number] #[id number] was introduced through the anus                            and advanced to the the terminal ileum, with                            identification  of the appendiceal orifice and IC                            valve. The colonoscopy was performed without                            difficulty. The patient tolerated the procedure                            well. The quality of the bowel preparation was                            fair. The terminal ileum, ileocecal valve,                            appendiceal orifice, and rectum were photographed.                            The bowel preparation used was Miralax . Scope In: 9:39:27 AM Scope Out: 9:52:20 AM Scope Withdrawal Time: 0 hours 10 minutes 13 seconds  Total Procedure Duration: 0 hours 12 minutes 53 seconds  Findings:                 The perianal and digital rectal examinations were                            normal.                           The terminal ileum appeared normal.                           Repeat examination of right colon under NBI                            performed.                           Multiple small-mouthed diverticula were found in  the left colon.                           The exam was otherwise without abnormality on                            direct and retroflexion views. Complications:            No immediate complications. Estimated Blood Loss:     Estimated blood loss: none. Impression:               - Preparation of the colon was fair.                           - The examined portion of the ileum was normal.                           - Diverticulosis in the left colon.                           - The examination was otherwise normal on direct                            and retroflexion views.                           - No specimens collected. Recommendation:           - Patient has a contact number available for                            emergencies. The signs and symptoms of potential                            delayed complications were discussed with the                            patient. Return to normal  activities tomorrow.                            Written discharge instructions were provided to the                            patient.                           - Resume previous diet.                           - Continue present medications.                           - Repeat colonoscopy in 1 year for screening                            purposes. (See prep details above) For next  colonoscopy, liquid prep such as Suprep or Plenvu                             with more water  consumption for improved bowel prep                            quality. Kaylee Lando L. Dominic Friendly, MD 08/22/2023 10:06:14 AM This report has been signed electronically.

## 2023-08-22 NOTE — Progress Notes (Signed)
 History and Physical:  This patient presents for endoscopic testing for: Encounter Diagnoses  Name Primary?   Iron  deficiency anemia, unspecified iron  deficiency anemia type Yes   Melena    Large hiatal hernia    Gastroesophageal reflux disease, unspecified whether esophagitis present     63 year old woman evaluated in the office by APP consult on 06/27/2023 for iron  deficiency anemia.  She had been on aspirin for history of DVT/PE until it was stopped due to the anemia and description of black stool.  She says she has no symptoms of GERD as long she takes her Protonix  daily.  She denies dysphagia or odynophagia.  Previous colonoscopy described marked diverticulosis and tortuosity with angulation. Further clinical details and most recent office consult note with no significant clinical changes since then. She is on iron  supplement, with last hemoglobin of 9.7 on 06/20/2023.  Patient is otherwise without complaints or active issues today.   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  The Custom gene panel offered by [facility] includes sequencing and rearrangement analysis for the following 24 genes:  ATM, BAP1, BARD1, BRCA1, BRCA2, BRIP1, CDH1, CDK4, CDKN2A, CHEK2, EPCAM, FANCC, MITF, MLH1, MSH2, MSH6, NBN, PALB2, PMS2, PTEN, RAD51C, RAD51D, TP53, and XRCC2.   The report date is February 13, 2016.   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   Melanoma (HCC) dx'd 2003   right leg   Morbid obesity with BMI of 50.0-59.9, adult (HCC) 03/23/2015   OPEN WOUND OF KNEE LEG AND ANKLE COMPLICATED 03/10/2009   Qualifier: Diagnosis of  By: Leroy Ranks RN, Denise     ovarian ca dx'd 10/216   Seasonal allergies    Umbilical hernia without obstruction and without gangrene 03/23/2015   Urinary, incontinence, stress female 10/27/2016     Past Surgical History: Past Surgical History:  Procedure Laterality Date   ABDOMINAL HYSTERECTOMY Bilateral 02/11/2015   Procedure: HYSTERECTOMY ABDOMINAL, bilateral salpingo-oophorectomy;  Surgeon: Belle Box, MD;  Location: [facility];  Service: Gynecology;  Laterality: Bilateral;   COLONOSCOPY  2013   COLONOSCOPY WITH ESOPHAGOGASTRODUODENOSCOPY (EGD)     DILATATION & CURETTAGE/HYSTEROSCOPY WITH TRUECLEAR N/A 08/17/2013   Procedure: DILATATION & CURETTAGE/HYSTEROSCOPY WITH TRUCLEAR;  Surgeon: Denette Finner, MD;  Location: [facility];  Service: Gynecology;  Laterality: N/A;   IR REMOVAL TUN ACCESS W/ PORT W/O FL MOD SED  07/05/2017   MELANOMA EXCISION  06/2001   right leg; also removed 2 lymph nodes   OMENTECTOMY N/A 02/11/2015   Procedure: OMENTECTOMY;  Surgeon: Belle Box, MD;  Location: [facility];  Service: Gynecology;  Laterality: N/A;   UMBILICAL HERNIA REPAIR      Allergies: Allergies  Allergen Reactions   Butorphanol Tartrate Other (See Comments)    Gave her the shakes for 6 weeks   Levofloxacin Palpitations    REACTION: tachycardia   Moxifloxacin Palpitations  REACTION: tachycardia but tolerated cirpofloxacin just fine   Stadol [Butorphanol] Other (See Comments)    Caused shakes for 6 weeks "shaking for weeks"   Amoxicillin Rash    REACTION: Rash with high doses. Can take lower doses like 500mg    Asa [Aspirin] Other (See Comments)    Pt stated the 81 mg caused Exacerbation of her asthma    Ciprofloxacin  Hcl Nausea And Vomiting    Can take lower doses  like 500mg .   Clindamycin /Lincomycin Nausea And Vomiting   Codeine Nausea And Vomiting   Erythromycin Diarrhea and Nausea And Vomiting   Latex Rash   Losartan  Nausea Only and Other (See Comments)    Headache, fatigue   Losartan  Potassium Nausea Only, Other (See Comments) and Nausea And Vomiting    headache   Paroxetine Other (See Comments)    Made nerves worse and caused fatigue   Venlafaxine Other (See Comments)    hyperactivity  Panic attacks  hyperactivity, Panic attacks   Cephalosporins Other (See Comments)    REACTION: she has tolerated rocephin  and Keflex  without difficulty   Clarithromycin Diarrhea   Clindamycin  Diarrhea   Influenza Vaccines Other (See Comments)    Pt states that she got really sick from flu vaccine was sick a total of 6 weeks   Paxil [Paroxetine Hcl] Other (See Comments)    fatigue   Tape Rash    Outpatient Meds: Current Outpatient Medications  Medication Sig Dispense Refill   amLODipine  (NORVASC ) 2.5 MG tablet TAKE 1 TABLET(2.5 MG) BY MOUTH IN THE MORNING AND AT BEDTIME 180 tablet 3   budesonide -formoterol  (SYMBICORT ) 160-4.5 MCG/ACT inhaler Inhale 2 puffs into the lungs 2 (two) times daily. 10.2 g 5   pantoprazole  (PROTONIX ) 40 MG tablet TAKE 1 TABLET(40 MG) BY MOUTH DAILY 90 tablet 2   sertraline  (ZOLOFT ) 50 MG tablet Take 1 tablet (50 mg total) by mouth daily. 90 tablet 3   albuterol  (PROVENTIL ) (2.5 MG/3ML) 0.083% nebulizer solution Take 3 mLs (2.5 mg total) by nebulization every 6 (six) hours as needed for wheezing or shortness of breath. 150 mL 1   albuterol  (VENTOLIN  HFA) 108 (90 Base) MCG/ACT inhaler INHALE 1 TO 2 PUFFS INTO THE LUNGS EVERY 6 HOURS AS NEEDED FOR WHEEZING OR SHORTNESS OF BREATH 6.7 g 2   ALPRAZolam  (XANAX ) 1 MG tablet Take 0.5-1 tablets (0.5-1 mg total) by mouth at bedtime as needed for anxiety. 45 tablet 1   betamethasone  valerate ointment (VALISONE ) 0.1 % Apply 1 Application topically 2 (two) times daily. (Patient not taking:  Reported on 08/22/2023) 45 g 2   celecoxib  (CELEBREX ) 100 MG capsule Take 1 capsule (100 mg total) by mouth 2 (two) times daily as needed. (Patient not taking: Reported on 08/22/2023) 60 capsule 1   diclofenac  Sodium (VOLTAREN ) 1 % GEL Apply 2 g topically 4 (four) times daily. 100 g 0   doxycycline  (MONODOX ) 100 MG capsule Take 100 mg by mouth. (Patient not taking: Reported on 08/22/2023)     fluconazole  (DIFLUCAN ) 150 MG tablet TAKE 1 TABLET BY MOUTH, REPEAT IN 72 HOURS IF NO IMPROVEMENT (Patient not taking: Reported on 08/22/2023) 2 tablet 0   fluorometholone (FML) 0.1 % ophthalmic suspension Place 1 drop into the left eye 3 (three) times daily. (Patient not taking: Reported on 08/22/2023)     Iron , Ferrous Sulfate , 325 (65 Fe) MG TABS Take 325 mg by mouth in the morning, at noon, and at bedtime. (Patient taking differently: Take 325 mg by mouth daily.) 90 tablet  2   Lactobacillus (ACIDOPHILUS PO) Take 1 capsule by mouth daily.     Nebulizers MISC 1 Units by Misc.(Non-Drug; Combo Route) route every 8 (eight) hours as needed.     Semaglutide -Weight Management 1.7 MG/0.75ML SOAJ Inject 1.7 mg into the skin once a week. 3 mL 5   triamterene -hydrochlorothiazide  (MAXZIDE ) 75-50 MG tablet TAKE 1 TABLET BY MOUTH DAILY (Patient not taking: Reported on 08/22/2023) 90 tablet 3   VITAMIN E PO Take by mouth daily.     Current Facility-Administered Medications  Medication Dose Route Frequency Provider Last Rate Last Admin   0.9 %  sodium chloride  infusion  500 mL Intravenous Once Danis, Koi Yarbro L III, MD          ___________________________________________________________________ Objective   Exam:  BP (!) 140/66   Pulse 82   Temp 98.4 F (36.9 C) (Temporal)   Resp 10   Ht 5\' 1"  (1.549 m)   Wt 219 lb 9.6 oz (99.6 kg)   LMP  (LMP Unknown)   SpO2 95%   BMI 41.49 kg/m   CV: regular , S1/S2 Resp: clear to auscultation bilaterally, normal RR and effort noted GI: soft, no tenderness, with active bowel  sounds.   Assessment: Encounter Diagnoses  Name Primary?   Iron  deficiency anemia, unspecified iron  deficiency anemia type Yes   Melena    Large hiatal hernia    Gastroesophageal reflux disease, unspecified whether esophagitis present      Plan: Colonoscopy   The benefits and risks of the planned procedure(s) were described in detail with the patient or (when appropriate) their health care proxy.  Risks were outlined as including, but not limited to, bleeding, infection, perforation, adverse medication reaction leading to cardiac or pulmonary decompensation, pancreatitis (if ERCP).  The limitation of incomplete mucosal visualization was also discussed.  No guarantees or warranties were given.  The patient is appropriate for an endoscopic procedure in the ambulatory setting.   - Lorella Roles, MD

## 2023-08-22 NOTE — Progress Notes (Signed)
 Vss nad trans to pacu

## 2023-08-22 NOTE — Progress Notes (Signed)
 RN called Patty, RN  to schedule CBC and iron  studies wihtin the next week.  Patient had labs drawn today.

## 2023-08-22 NOTE — Patient Instructions (Addendum)
 Educational handout provided to patient related to Hiatal hernia & Diverticulosis  Resume previous diet  Continue present medications  Awaiting pathology results  CBC & iron  studies drawn today in lab  Repeat colonoscopy in 1 year for screening purposes  YOU HAD AN ENDOSCOPIC PROCEDURE TODAY AT THE [facility]:   Refer to the procedure report that was given to you for any specific questions about what was found during the examination.  If the procedure report does not answer your questions, please call your gastroenterologist to clarify.  If you requested that your care partner not be given the details of your procedure findings, then the procedure report has been included in a sealed envelope for you to review at your convenience later.  YOU SHOULD EXPECT: Some feelings of bloating in the abdomen. Passage of more gas than usual.  Walking can help get rid of the air that was put into your GI tract during the procedure and reduce the bloating. If you had a lower endoscopy (such as a colonoscopy or flexible sigmoidoscopy) you may notice spotting of blood in your stool or on the toilet paper. If you underwent a bowel prep for your procedure, you may not have a normal bowel movement for a few days.  Please Note:  You might notice some irritation and congestion in your nose or some drainage.  This is from the oxygen used during your procedure.  There is no need for concern and it should clear up in a day or so.  SYMPTOMS TO REPORT IMMEDIATELY:  Following lower endoscopy (colonoscopy or flexible sigmoidoscopy):  Excessive amounts of blood in the stool  Significant tenderness or worsening of abdominal pains  Swelling of the abdomen that is new, acute  Fever of 100F or higher  Following upper endoscopy (EGD)  Vomiting of blood or coffee ground material  New chest pain or pain under the shoulder blades  Painful or persistently difficult swallowing  New shortness of breath  Fever  of 100F or higher  Black, tarry-looking stools  For urgent or emergent issues, a gastroenterologist can be reached at any hour by calling [phone]. Do not use MyChart messaging for urgent concerns.    DIET:  We do recommend a small meal at first, but then you may proceed to your regular diet.  Drink plenty of fluids but you should avoid alcoholic beverages for 24 hours.  ACTIVITY:  You should plan to take it easy for the rest of today and you should NOT DRIVE or use heavy machinery until tomorrow (because of the sedation medicines used during the test).    FOLLOW UP: Our staff will call the number listed on your records the next business day following your procedure.  We will call around 7:15- 8:00 am to check on you and address any questions or concerns that you may have regarding the information given to you following your procedure. If we do not reach you, we will leave a message.     If any biopsies were taken you will be contacted by phone or by letter within the next 1-3 weeks.  Please call us  at [phone] if you have not heard about the biopsies in 3 weeks.    SIGNATURES/CONFIDENTIALITY: You and/or your care partner have signed paperwork which will be entered into your electronic medical record.  These signatures attest to the fact that that the information above on your After Visit Summary has been reviewed and is understood.  Full responsibility of the  confidentiality of this discharge information lies with you and/or your care-partner.

## 2023-08-22 NOTE — Progress Notes (Signed)
 Called to room to assist during endoscopic procedure.  Patient ID and intended procedure confirmed with present staff. Received instructions for my participation in the procedure from the performing physician.

## 2023-08-22 NOTE — Progress Notes (Signed)
 Pt's states no medical or surgical changes since previsit or office visit.

## 2023-08-23 ENCOUNTER — Ambulatory Visit: Payer: Self-pay | Admitting: Gastroenterology

## 2023-08-23 ENCOUNTER — Telehealth: Payer: Self-pay

## 2023-08-23 NOTE — Telephone Encounter (Signed)
 No answer

## 2023-08-25 DIAGNOSIS — R21 Rash and other nonspecific skin eruption: Secondary | ICD-10-CM | POA: Diagnosis not present

## 2023-08-25 DIAGNOSIS — R768 Other specified abnormal immunological findings in serum: Secondary | ICD-10-CM | POA: Diagnosis not present

## 2023-08-25 DIAGNOSIS — M359 Systemic involvement of connective tissue, unspecified: Secondary | ICD-10-CM | POA: Diagnosis not present

## 2023-08-25 LAB — SURGICAL PATHOLOGY

## 2023-08-28 ENCOUNTER — Ambulatory Visit: Payer: Self-pay | Admitting: Gastroenterology

## 2023-08-29 ENCOUNTER — Other Ambulatory Visit: Payer: Self-pay

## 2023-08-29 ENCOUNTER — Telehealth: Payer: Self-pay

## 2023-08-29 DIAGNOSIS — D509 Iron deficiency anemia, unspecified: Secondary | ICD-10-CM

## 2023-08-29 NOTE — Telephone Encounter (Signed)
 Called pt and LM about coming in to get her CBC and iron  studies done this week. Lab orders placed.

## 2023-08-30 NOTE — Telephone Encounter (Signed)
 Pt informed lab orders were placed. She states she lives in [place] and has a hard time getting to [place]. She is supposed to see her PCP within the next week and she states they usually check her CBC and iron  panel. Pt reports she will ask her PCP to draw labs and fax over results if she is unable to make it out here. Office fax # provided.

## 2023-08-30 NOTE — Telephone Encounter (Signed)
 Patient returning phone call. Requesting a call back to discuss labs further. Please advise, thank you.

## 2023-08-31 NOTE — Telephone Encounter (Signed)
 Pt called back to ask about a stool study to see if there is any hidden blood in her stool. She states she remembered you mentioned she wasn't completely cleaned out but she was loopy and forgot to ask after her procedure. She would like to know if she should get a hemoccult test as well as the labs following her colonoscopy. She is wondering if there is any hidden blood in her stool that could be causing her hemoglobin to be low. Please advise.

## 2023-08-31 NOTE — Telephone Encounter (Signed)
 Patient requesting f/u cal to discuss previous note. Please advise.

## 2023-09-01 NOTE — Telephone Encounter (Signed)
 LM with providers recommendation. Provided office number to call back if she has any questions.

## 2023-09-01 NOTE — Telephone Encounter (Signed)
 Okay with me if her CBC and iron  studies are done with primary care if easier for her. Have labs faxed to us   She does not need a stool study for occult blood.  Further discussion at office follow-up.  Memory Staggers MD

## 2023-09-02 ENCOUNTER — Other Ambulatory Visit: Payer: Self-pay | Admitting: Family Medicine

## 2023-09-09 ENCOUNTER — Ambulatory Visit: Admitting: Family Medicine

## 2023-09-14 ENCOUNTER — Ambulatory Visit (INDEPENDENT_AMBULATORY_CARE_PROVIDER_SITE_OTHER): Admitting: Family Medicine

## 2023-09-14 ENCOUNTER — Encounter: Payer: Self-pay | Admitting: Family Medicine

## 2023-09-14 VITALS — BP 132/74 | HR 89 | Temp 98.0°F | Resp 16 | Ht 61.0 in | Wt 211.6 lb

## 2023-09-14 DIAGNOSIS — S81802A Unspecified open wound, left lower leg, initial encounter: Secondary | ICD-10-CM

## 2023-09-14 DIAGNOSIS — D509 Iron deficiency anemia, unspecified: Secondary | ICD-10-CM | POA: Diagnosis not present

## 2023-09-14 LAB — CBC WITH DIFFERENTIAL/PLATELET
Basophils Absolute: 0 10*3/uL (ref 0.0–0.1)
Basophils Relative: 0.7 % (ref 0.0–3.0)
Eosinophils Absolute: 0.1 10*3/uL (ref 0.0–0.7)
Eosinophils Relative: 2.6 % (ref 0.0–5.0)
HCT: 35 % — ABNORMAL LOW (ref 36.0–46.0)
Hemoglobin: 11.5 g/dL — ABNORMAL LOW (ref 12.0–15.0)
Lymphocytes Relative: 17.6 % (ref 12.0–46.0)
Lymphs Abs: 0.6 10*3/uL — ABNORMAL LOW (ref 0.7–4.0)
MCHC: 32.9 g/dL (ref 30.0–36.0)
MCV: 84.2 fl (ref 78.0–100.0)
Monocytes Absolute: 0.4 10*3/uL (ref 0.1–1.0)
Monocytes Relative: 11.5 % (ref 3.0–12.0)
Neutro Abs: 2.4 10*3/uL (ref 1.4–7.7)
Neutrophils Relative %: 67.6 % (ref 43.0–77.0)
Platelets: 392 10*3/uL (ref 150.0–400.0)
RBC: 4.15 Mil/uL (ref 3.87–5.11)
RDW: 13.9 % (ref 11.5–15.5)
WBC: 3.5 10*3/uL — ABNORMAL LOW (ref 4.0–10.5)

## 2023-09-14 LAB — IBC + FERRITIN
Ferritin: 41.6 ng/mL (ref 10.0–291.0)
Iron: 61 ug/dL (ref 42–145)
Saturation Ratios: 20.7 % (ref 20.0–50.0)
TIBC: 294 ug/dL (ref 250.0–450.0)
Transferrin: 210 mg/dL — ABNORMAL LOW (ref 212.0–360.0)

## 2023-09-14 MED ORDER — DOXYCYCLINE MONOHYDRATE 100 MG PO CAPS: 100.0000 mg | ORAL_CAPSULE | Freq: Two times a day (BID) | ORAL | 0 refills | Status: AC

## 2023-09-14 MED ORDER — BETAMETHASONE DIPROPIONATE AUG 0.05 % EX CREA: TOPICAL_CREAM | Freq: Two times a day (BID) | CUTANEOUS | 0 refills | Status: DC

## 2023-09-14 MED ORDER — STERILE SALINE SOLN: 0 refills | Status: AC

## 2023-09-14 NOTE — Patient Instructions (Addendum)
 Keep the area clean and dry.   Things to look out for: increasing pain not relieved by ibuprofen /acetaminophen , fevers, spreading redness, drainage of pus, or foul odor.  If you do not hear anything about your referral in the next 1-2 weeks, call our office and ask for an update.  Let us  know if you need anything.

## 2023-09-14 NOTE — Progress Notes (Signed)
 Chief Complaint  Patient presents with   Follow-up    Follow Up    [name] is a 63 y.o. female here for a skin complaint.  Duration: 3 months Location: L lower extremity Pruritic? No Painful? Yes Drainage? Yes- greenish yellow Trauma? No Sick contacts? 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  The Custom gene panel offered by [facility] includes sequencing and rearrangement analysis for the following 24 genes:  ATM, BAP1, BARD1, BRCA1, BRCA2, BRIP1, CDH1, CDK4, CDKN2A, CHEK2, EPCAM, FANCC, MITF, MLH1, MSH2, MSH6, NBN, PALB2, PMS2, PTEN, RAD51C, RAD51D, TP53, and XRCC2.   The report date is February 13, 2016.   GERD (gastroesophageal reflux disease)    Heart murmur    History of melanoma excision 03/23/2015   Hypertension    Leukopenia due to antineoplastic chemotherapy (HCC) 04/16/2015   Melanoma (HCC) dx'd 2003   right leg   Morbid obesity with BMI of 50.0-59.9, adult (HCC) 03/23/2015   OPEN WOUND OF KNEE LEG AND ANKLE COMPLICATED 03/10/2009   Qualifier: Diagnosis of  By: Leroy Ranks RN, Denise     ovarian ca dx'd 10/216   Seasonal allergies    Umbilical hernia without obstruction and without gangrene 03/23/2015   Urinary, incontinence, stress female 10/27/2016    BP 132/74 (BP Location: Left Arm, Patient Position: Sitting)   Pulse 89   Temp 98 F (36.7 C) (Oral)   Resp 16   Ht 5\' 1"  (1.549 m)   Wt 211 lb 9.6 oz (96 kg)   LMP  (LMP  Unknown)   SpO2 98%   BMI 39.98 kg/m  Gen: awake, alert, appearing stated age Lungs: No accessory muscle use Skin: See below. Scant purulent drainage, erythema, TTP, warmth; no fluctuance, excoriation Psych: Age appropriate judgment and insight    Wound of left lower extremity, initial encounter - Plan: Ambulatory referral to Wound Clinic  Iron  deficiency anemia, unspecified iron  deficiency anemia type - Plan: IBC + Ferritin, CBC w/Diff  14 d of doxy. Refer wound care team. Dressed again today.  Ck labs for GI.  F/u prn. The patient voiced understanding and agreement to the plan.  Shellie Dials Winchester, DO 09/14/23 1:23 PM

## 2023-09-15 ENCOUNTER — Ambulatory Visit: Payer: Self-pay | Admitting: Family Medicine

## 2023-09-20 ENCOUNTER — Inpatient Hospital Stay

## 2023-09-20 ENCOUNTER — Telehealth: Payer: Self-pay | Admitting: Family Medicine

## 2023-09-20 ENCOUNTER — Inpatient Hospital Stay: Admitting: Family

## 2023-09-20 NOTE — Telephone Encounter (Signed)
 Copied from CRM [id number]. Topic: Medicare AWV >> Sep 20, 2023 10:06 AM [name] wrote: Reason for CRM: LVM 09/20/2023 to schedule AWV. Please schedule Virtual or Telehealth visits ONLY.   [name]; Care Guide Ambulatory Clinical Support [facility] Direct Dial: [phone]

## 2023-09-20 NOTE — Telephone Encounter (Signed)
 Copied from CRM [id number]. Topic: Referral - Status >> Sep 20, 2023  2:04 PM [name] wrote: Reason for CRM: Patient is calling because she called [facility] Address for Driving Directions: [address] [place], [place] [address] [phone] to follow up on the referral and they advised patient they have not received it. She would like for it to be resent.  Referral placed on 09/16/2023.

## 2023-09-20 NOTE — Telephone Encounter (Signed)
 Copied from CRM [id number]. Topic: Referral - Status >> Sep 20, 2023  1:36 PM Magdalene School wrote: Reason for CRM: Patient calling to check on bone density referral and would like it sent to same place she has her mammograms done. She also mentioned that she received a letter that it was time to do her mammogram so she would like order for that too if possible.

## 2023-09-20 NOTE — Telephone Encounter (Signed)
 Copied from CRM [id number]. Topic: Medical Record Request - Other >> Sep 20, 2023  1:40 PM Magdalene School wrote: Reason for CRM: Patient would like to have lab results faxed to her oncologist Tamsen Fallow, FNP-BC - Nurse Practitioner.  [facility] [address] [place], [place] [address] Phone: [phone]

## 2023-09-26 ENCOUNTER — Telehealth: Payer: Self-pay

## 2023-09-26 NOTE — Telephone Encounter (Signed)
 Copied from CRM [id number]. Topic: Clinical - Medical Advice >> Sep 26, 2023 12:43 PM Trula Gable C wrote: Reason for CRM: Patient called in wanting to speak with [facility] regarding her wound care, would like for him to give her a callback on if she would be able to be seen at the location at [facility] with and Pt was advised 3 weeks  maybe the earliest appointment with wound care. Pt stated she understand  but will need refill on doxycycline  (MONODOX ) 100 MG capsule and ask if Dr.Wendling would send enough for 2 weeks.

## 2023-09-26 NOTE — Telephone Encounter (Signed)
 Should not need.  If she disagrees, glad to take a look.  Thank you.

## 2023-09-26 NOTE — Telephone Encounter (Signed)
 Called pt was advised medication not needed, tried schedule appt. Pt stated she will call back tomorrow  and said it's starting to smell a little more.

## 2023-09-27 NOTE — Telephone Encounter (Signed)
 Pt refused at the time and stated she would call back today, was advised should be seen  by provider to look at wound.

## 2023-09-27 NOTE — Telephone Encounter (Signed)
Have her come in today please

## 2023-10-17 DIAGNOSIS — L97922 Non-pressure chronic ulcer of unspecified part of left lower leg with fat layer exposed: Secondary | ICD-10-CM | POA: Diagnosis not present

## 2023-10-17 DIAGNOSIS — L97222 Non-pressure chronic ulcer of left calf with fat layer exposed: Secondary | ICD-10-CM | POA: Diagnosis not present

## 2023-10-17 DIAGNOSIS — I872 Venous insufficiency (chronic) (peripheral): Secondary | ICD-10-CM | POA: Diagnosis not present

## 2023-10-17 DIAGNOSIS — I890 Lymphedema, not elsewhere classified: Secondary | ICD-10-CM | POA: Diagnosis not present

## 2023-10-18 ENCOUNTER — Ambulatory Visit: Payer: Medicare Other | Admitting: Family Medicine

## 2023-10-18 ENCOUNTER — Encounter: Payer: Self-pay | Admitting: Family Medicine

## 2023-10-18 VITALS — BP 128/76 | HR 80 | Temp 98.0°F | Resp 16 | Ht 61.0 in | Wt 208.0 lb

## 2023-10-18 DIAGNOSIS — J4540 Moderate persistent asthma, uncomplicated: Secondary | ICD-10-CM

## 2023-10-18 DIAGNOSIS — I10 Essential (primary) hypertension: Secondary | ICD-10-CM

## 2023-10-18 DIAGNOSIS — F411 Generalized anxiety disorder: Secondary | ICD-10-CM

## 2023-10-18 DIAGNOSIS — Z862 Personal history of diseases of the blood and blood-forming organs and certain disorders involving the immune mechanism: Secondary | ICD-10-CM | POA: Diagnosis not present

## 2023-10-18 DIAGNOSIS — E2839 Other primary ovarian failure: Secondary | ICD-10-CM

## 2023-10-18 LAB — COMPREHENSIVE METABOLIC PANEL WITH GFR
ALT: 14 U/L (ref 0–35)
AST: 28 U/L (ref 0–37)
Albumin: 3.3 g/dL — ABNORMAL LOW (ref 3.5–5.2)
Alkaline Phosphatase: 84 U/L (ref 39–117)
BUN: 18 mg/dL (ref 6–23)
CO2: 25 meq/L (ref 19–32)
Calcium: 9.3 mg/dL (ref 8.4–10.5)
Chloride: 102 meq/L (ref 96–112)
Creatinine, Ser: 0.79 mg/dL (ref 0.40–1.20)
GFR: 79.7 mL/min (ref >60.00)
Glucose, Bld: 74 mg/dL (ref 70–99)
Potassium: 3.9 meq/L (ref 3.5–5.1)
Sodium: 133 meq/L — ABNORMAL LOW (ref 135–145)
Total Bilirubin: 0.6 mg/dL (ref 0.2–1.2)
Total Protein: 8.9 g/dL — ABNORMAL HIGH (ref 6.0–8.3)

## 2023-10-18 LAB — LIPID PANEL
Cholesterol: 130 mg/dL (ref 0–200)
HDL: 44.4 mg/dL (ref >39.00)
LDL Cholesterol: 71 mg/dL (ref 0–99)
NonHDL: 85.15
Total CHOL/HDL Ratio: 3
Triglycerides: 72 mg/dL (ref 0.0–149.0)
VLDL: 14.4 mg/dL (ref 0.0–40.0)

## 2023-10-18 LAB — CBC
HCT: 36 % (ref 36.0–46.0)
Hemoglobin: 11.6 g/dL — ABNORMAL LOW (ref 12.0–15.0)
MCHC: 32.3 g/dL (ref 30.0–36.0)
MCV: 86 fl (ref 78.0–100.0)
Platelets: 327 K/uL (ref 150.0–400.0)
RBC: 4.18 Mil/uL (ref 3.87–5.11)
RDW: 15 % (ref 11.5–15.5)
WBC: 3.7 K/uL — ABNORMAL LOW (ref 4.0–10.5)

## 2023-10-18 LAB — IBC + FERRITIN
Ferritin: 64.4 ng/mL (ref 10.0–291.0)
Iron: 94 ug/dL (ref 42–145)
Saturation Ratios: 34.6 % (ref 20.0–50.0)
TIBC: 271.6 ug/dL (ref 250.0–450.0)
Transferrin: 194 mg/dL — ABNORMAL LOW (ref 212.0–360.0)

## 2023-10-18 MED ORDER — ALBUTEROL SULFATE HFA 108 (90 BASE) MCG/ACT IN AERS: 1.0000 | INHALATION_SPRAY | Freq: Four times a day (QID) | RESPIRATORY_TRACT | 2 refills | Status: DC | PRN

## 2023-10-18 MED ORDER — ALPRAZOLAM 1 MG PO TABS: 0.5000 mg | ORAL_TABLET | Freq: Every evening | ORAL | 1 refills | Status: DC | PRN

## 2023-10-18 MED ORDER — BUDESONIDE-FORMOTEROL FUMARATE 160-4.5 MCG/ACT IN AERO: 2.0000 | INHALATION_SPRAY | Freq: Two times a day (BID) | RESPIRATORY_TRACT | 5 refills | Status: DC

## 2023-10-18 NOTE — Patient Instructions (Signed)
Give us 2-3 business days to get the results of your labs back.   Keep the diet clean and stay active.  If you do not hear anything about your referral in the next 1-2 weeks, call our office and ask for an update.  Let us know if you need anything. 

## 2023-10-18 NOTE — Progress Notes (Signed)
 Chief Complaint  Patient presents with   Follow-up    Follow Up    Subjective [name] is a 63 y.o. female who presents for hypertension follow up. She does not monitor home blood pressures. She is compliant with medications- Norvasc  2.5 mg bid. Patient has these side effects of medication: none She is usually adhering to a healthy diet overall. Current exercise: walking, lifting No CP or SOB.   GAD Taking Zoloft  50 mg/d. Compliant, no AE's. Takes Xanax  prn. Rare occurrence. Usually situational. No SI or HI. No self medication. She does not follow w a therapist.   History of asthma.  Taking Symbicort  2 puffs twice daily.  Compliant, no adverse effects.  Takes albuterol  sparingly.   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  The Custom gene panel offered by [facility] includes sequencing and rearrangement analysis for the following 24 genes:  ATM, BAP1, BARD1, BRCA1, BRCA2, BRIP1, CDH1, CDK4, CDKN2A, CHEK2, EPCAM, FANCC, MITF, MLH1, MSH2, MSH6, NBN, PALB2, PMS2, PTEN, RAD51C, RAD51D, TP53, and XRCC2.   The report date is February 13, 2016.   GERD (gastroesophageal reflux disease)    Heart murmur    History of melanoma excision 03/23/2015   Hypertension    Leukopenia due to antineoplastic chemotherapy (HCC) 04/16/2015   Melanoma (HCC) dx'd 2003   right leg   Morbid obesity with BMI of 50.0-59.9, adult (HCC) 03/23/2015   OPEN WOUND OF KNEE LEG AND ANKLE COMPLICATED 03/10/2009   Qualifier: Diagnosis of  By: Lutricia RN, Denise     ovarian ca dx'd  10/216   Seasonal allergies    Umbilical hernia without obstruction and without gangrene 03/23/2015   Urinary, incontinence, stress female 10/27/2016    Exam BP 128/76 (BP Location: Left Arm, Patient Position: Sitting)   Pulse 80   Temp 98 F (36.7 C) (Oral)   Resp 16   Ht 5' 1 (1.549 m)   Wt 208 lb (94.3 kg)   LMP  (LMP Unknown)   SpO2 98%   BMI 39.30 kg/m  General:  well developed, well nourished, in no apparent distress Heart: RRR, no bruits, no LE edema Lungs: clear to auscultation, no accessory muscle use Psych: well oriented with normal range of affect and appropriate judgment/insight  Essential hypertension - Plan: Comprehensive metabolic panel with GFR, Lipid panel, CBC  GAD (generalized anxiety disorder)  Moderate persistent asthma without complication - Plan: budesonide -formoterol  (SYMBICORT ) 160-4.5 MCG/ACT inhaler  History of iron  deficiency anemia - Plan: IBC + Ferritin  Estrogen deficiency - Plan: DG Bone Density  Chronic, stable.  Continue amlodipine  2.5 mg twice daily.  Counseled on diet and exercise. Chronic, stable.  Continue Zoloft  50 mg daily, Xanax  as needed. Chronic, stable.  Continue Symbicort  2 puffs twice daily, albuterol  as needed as well. Follow-up on iron  levels for the hematology team. Due for bone density scan, needs order. F/u in 6 months. The patient voiced understanding and agreement to the plan.  Mabel [place] Ros[place], DO 10/18/23  12:04 PM

## 2023-10-19 ENCOUNTER — Ambulatory Visit: Payer: Self-pay | Admitting: Family Medicine

## 2023-10-24 ENCOUNTER — Other Ambulatory Visit

## 2023-10-24 DIAGNOSIS — L97222 Non-pressure chronic ulcer of left calf with fat layer exposed: Secondary | ICD-10-CM | POA: Diagnosis not present

## 2023-10-24 DIAGNOSIS — I890 Lymphedema, not elsewhere classified: Secondary | ICD-10-CM | POA: Diagnosis not present

## 2023-10-24 DIAGNOSIS — L97922 Non-pressure chronic ulcer of unspecified part of left lower leg with fat layer exposed: Secondary | ICD-10-CM | POA: Diagnosis not present

## 2023-10-28 ENCOUNTER — Telehealth: Payer: Self-pay

## 2023-10-28 ENCOUNTER — Other Ambulatory Visit: Payer: Self-pay

## 2023-10-28 ENCOUNTER — Telehealth: Payer: Self-pay | Admitting: Family Medicine

## 2023-10-28 DIAGNOSIS — M1990 Unspecified osteoarthritis, unspecified site: Secondary | ICD-10-CM

## 2023-10-28 DIAGNOSIS — Z1239 Encounter for other screening for malignant neoplasm of breast: Secondary | ICD-10-CM

## 2023-10-28 NOTE — Telephone Encounter (Signed)
 Called pt was advised bone density reorder, pt stated understand.

## 2023-10-28 NOTE — Telephone Encounter (Signed)
 Copied from CRM [id number]. Topic: Referral - Request for Referral >> Oct 28, 2023  8:18 AM [name] wrote: Did the patient discuss referral with their provider in the last year? Yes (If No - schedule appointment) (If Yes - send message)  Appointment offered? Yes  Type of order/referral and detailed reason for visit: Bone Density  Preference of office, provider, location: [facility] [address] [place], [phone].   If referral order, have you been seen by this specialty before? Yes (If Yes, this issue or another issue? When? Where?  Can we respond through MyChart? No

## 2023-10-28 NOTE — Telephone Encounter (Signed)
 Copied from CRM [id number]. Topic: Referral - Request for Referral >> Oct 28, 2023  8:18 AM [name] wrote: Did the patient discuss referral with their provider in the last year? Yes (If No - schedule appointment) (If Yes - send message)  Appointment offered? Yes  Type of order/referral and detailed reason for visit: Bone Density  Preference of office, provider, location: [facility] [address] [place], [phone].   If referral order, have you been seen by this specialty before? Yes (If Yes, this issue or another issue? When? Where?  Can we respond through MyChart? No >> Oct 28, 2023  1:45 PM [name] wrote: Patient called back stating that she spoke with [facility] and instead of an electronic order, they will need this bone density order faxed to [phone]. Patient would like a call back when this order has been faxed.

## 2023-10-31 DIAGNOSIS — I872 Venous insufficiency (chronic) (peripheral): Secondary | ICD-10-CM | POA: Diagnosis not present

## 2023-10-31 DIAGNOSIS — L97222 Non-pressure chronic ulcer of left calf with fat layer exposed: Secondary | ICD-10-CM | POA: Diagnosis not present

## 2023-10-31 DIAGNOSIS — L97922 Non-pressure chronic ulcer of unspecified part of left lower leg with fat layer exposed: Secondary | ICD-10-CM | POA: Diagnosis not present

## 2023-10-31 DIAGNOSIS — I890 Lymphedema, not elsewhere classified: Secondary | ICD-10-CM | POA: Diagnosis not present

## 2023-11-02 ENCOUNTER — Encounter: Payer: Self-pay | Admitting: Gastroenterology

## 2023-11-02 ENCOUNTER — Ambulatory Visit (INDEPENDENT_AMBULATORY_CARE_PROVIDER_SITE_OTHER): Admitting: Gastroenterology

## 2023-11-02 VITALS — BP 118/72 | HR 85 | Ht 61.0 in | Wt 213.8 lb

## 2023-11-02 DIAGNOSIS — K317 Polyp of stomach and duodenum: Secondary | ICD-10-CM | POA: Diagnosis not present

## 2023-11-02 DIAGNOSIS — D509 Iron deficiency anemia, unspecified: Secondary | ICD-10-CM

## 2023-11-02 DIAGNOSIS — K219 Gastro-esophageal reflux disease without esophagitis: Secondary | ICD-10-CM | POA: Diagnosis not present

## 2023-11-02 DIAGNOSIS — D649 Anemia, unspecified: Secondary | ICD-10-CM | POA: Diagnosis not present

## 2023-11-02 DIAGNOSIS — K449 Diaphragmatic hernia without obstruction or gangrene: Secondary | ICD-10-CM | POA: Diagnosis not present

## 2023-11-02 NOTE — Patient Instructions (Signed)
 _______________________________________________________  If your blood pressure at your visit was 140/90 or greater, please contact your primary care physician to follow up on this.  _______________________________________________________  If you are age 63 or older, your body mass index should be between 23-30. Your Body mass index is 40.4 kg/m. If this is out of the aforementioned range listed, please consider follow up with your Primary Care Provider.  If you are age 32 or younger, your body mass index should be between 19-25. Your Body mass index is 40.4 kg/m. If this is out of the aformentioned range listed, please consider follow up with your Primary Care Provider.   ________________________________________________________  The [facility] providers would like to encourage you to use MYCHART to communicate with providers for non-urgent requests or questions.  Due to long hold times on the telephone, sending your provider a message by [facility] may be a faster and more efficient way to get a response.  Please allow 48 business hours for a response.  Please remember that this is for non-urgent requests.  _______________________________________________________  [facility] is using a team-based approach to care.  Your team is made up of your doctor and two to three APPS. Our APPS (Nurse Practitioners and Physician Assistants) work with your physician to ensure care continuity for you. They are fully qualified to address your health concerns and develop a treatment plan. They communicate directly with your gastroenterologist to care for you. Seeing the Advanced Practice Practitioners on your physician's team can help you by facilitating care more promptly, often allowing for earlier appointments, access to diagnostic testing, procedures, and other specialty referrals.    Thank you for trusting me with your gastrointestinal care!    Dr. Victory Legrand [facility]

## 2023-11-02 NOTE — Progress Notes (Signed)
 [facility] Progress Note  Chief Complaint: Iron  deficiency anemia  Subjective  Prior history  IDA - APP clinic consult with details 06/27/23 Prior endoscopic procedures at outside practice in 2022 noted in the consult note Sees hematology for the anemia and history of DVT/PE, was on aspirin until discontinued when she had black stool prior to GI consult.  08/22/2023:   Colonoscopy to the terminal ileum without source of blood loss (fair prep-1 year recall recommended for screening) EGD with 10 cm hiatal hernia with Ole erosions and hernia sac, benign-appearing gastric polyps biopsy (pathology below) Biopsies negative for celiac and H. Pylori  My clinical suspicion is she may have had peptic ulcer from aspirin use that had resolved after discontinuing it and taking PPI prior to the EGD. Hiatal hernia related Ole erosions also likely contributing to IDA.  History of Present Illness  [name] was here today to follow-up her iron  deficiency anemia.  She is currently taking iron  tablets twice a day and wondering what to do with the dosing.  No current follow-up with hematology scheduled as near she knows Intermittent constipation, somewhat worse on the iron . She reports having intermittent heartburn for years that she feels was well-controlled on her current once daily pantoprazole .  She also has experienced a decrease of those symptoms with the weight loss that she has had on semaglutide .  Denies dysphagia or odynophagia.  No further black or bloody stools since well before her clinic visit with us   ROS: Cardiovascular:  no chest pain Respiratory: no dyspnea  The patient's Past Medical, Family and Social History were reviewed and are on file in the EMR. 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  The Custom gene panel offered by [facility] includes sequencing and rearrangement analysis for the following 24 genes:  ATM, BAP1, BARD1, BRCA1, BRCA2, BRIP1, CDH1, CDK4, CDKN2A, CHEK2, EPCAM, FANCC, MITF, MLH1, MSH2, MSH6, NBN, PALB2, PMS2, PTEN, RAD51C, RAD51D, TP53, and XRCC2.   The report date is February 13, 2016.   GERD (gastroesophageal reflux disease)    Heart murmur    History of melanoma excision 03/23/2015   Hypertension    Leukopenia due to antineoplastic chemotherapy (HCC) 04/16/2015   Melanoma (HCC) dx'd 2003   right leg   Morbid obesity with BMI of 50.0-59.9, adult (HCC) 03/23/2015   OPEN WOUND OF KNEE LEG AND ANKLE COMPLICATED 03/10/2009   Qualifier: Diagnosis of  By: Lutricia RN, Denise     ovarian ca dx'd 10/216   Seasonal allergies    Umbilical hernia without obstruction and without gangrene 03/23/2015   Urinary, incontinence, stress female 10/27/2016    Past Surgical History:  Procedure Laterality Date   ABDOMINAL HYSTERECTOMY Bilateral 02/11/2015   Procedure: HYSTERECTOMY ABDOMINAL, bilateral salpingo-oophorectomy;  Surgeon: Alm Cook, MD;  Location: [facility];  Service: Gynecology;  Laterality: Bilateral;   COLONOSCOPY  2013   COLONOSCOPY WITH ESOPHAGOGASTRODUODENOSCOPY (EGD)     DILATATION & CURETTAGE/HYSTEROSCOPY WITH TRUECLEAR N/A 08/17/2013   Procedure: DILATATION & CURETTAGE/HYSTEROSCOPY WITH TRUCLEAR;  Surgeon: Alm JAYSON Cook, MD;  Location: [facility];  Service: Gynecology;  Laterality: N/A;   IR REMOVAL TUN ACCESS W/ PORT W/O FL MOD SED  07/05/2017   MELANOMA EXCISION  06/2001   right leg; also removed 2 lymph nodes   OMENTECTOMY N/A 02/11/2015   Procedure: OMENTECTOMY;  Surgeon: Alm Cook, MD;  Location:  [facility];  Service: Gynecology;  Laterality: N/A;   UMBILICAL HERNIA REPAIR       Objective:  Med list reviewed  Current Outpatient Medications:    albuterol  (PROVENTIL ) (2.5 MG/3ML) 0.083% nebulizer solution, Take 3 mLs (2.5 mg total) by nebulization every 6 (six) hours as needed for wheezing or shortness of breath., Disp: 150 mL, Rfl: 1   albuterol  (VENTOLIN  HFA) 108 (90 Base) MCG/ACT inhaler, Inhale 1-2 puffs into the lungs every 6 (six) hours as needed for wheezing or shortness of breath., Disp: 6.7 g, Rfl: 2   ALPRAZolam  (XANAX ) 1 MG tablet, Take 0.5-1 tablets (0.5-1 mg total) by mouth at bedtime as needed for anxiety., Disp: 45 tablet, Rfl: 1   amLODipine  (NORVASC ) 2.5 MG tablet, TAKE 1 TABLET(2.5 MG) BY MOUTH IN THE MORNING AND AT BEDTIME, Disp: 180 tablet, Rfl: 3   augmented betamethasone  dipropionate (DIPROLENE -AF) 0.05 % cream, Apply topically 2 (two) times daily., Disp: 30 g, Rfl: 0   budesonide -formoterol  (SYMBICORT ) 160-4.5 MCG/ACT inhaler, Inhale 2 puffs into the lungs 2 (two) times daily., Disp: 10.2 g, Rfl: 5   Iron , Ferrous Sulfate , 325 (65 Fe) MG TABS, Take 325 mg by mouth in the morning, at noon, and at bedtime. (Patient taking differently: Take 325 mg by mouth daily.), Disp: 90 tablet, Rfl: 2   Lactobacillus (ACIDOPHILUS PO), Take 1 capsule by mouth daily., Disp: , Rfl:    Nebulizers MISC, 1 Units by Misc.(Non-Drug; Combo Route) route every 8 (eight) hours as needed., Disp: , Rfl:    pantoprazole  (PROTONIX ) 40 MG tablet, TAKE 1 TABLET(40 MG) BY MOUTH DAILY, Disp: 90 tablet, Rfl: 2   Semaglutide -Weight Management 1.7 MG/0.75ML SOAJ, Inject 1.7 mg into the skin once a week., Disp: 3 mL, Rfl: 5   sertraline  (ZOLOFT ) 50 MG tablet, Take 1 tablet (50 mg total) by mouth daily., Disp: 90 tablet, Rfl: 3   Soft Lens Products (STERILE SALINE) SOLN, Use to clean wound twice daily., Disp: 360 mL, Rfl: 0   VITAMIN E PO, Take by mouth daily., Disp: , Rfl:    Vital signs in last 24  hrs: Vitals:   11/02/23 1046  BP: 118/72  Pulse: 85   Wt Readings from Last 3 Encounters:  11/02/23 213 lb 12.8 oz (97 kg)  10/18/23 208 lb (94.3 kg)  09/14/23 211 lb 9.6 oz (96 kg)    Physical Exam  Limited exam in chair.  She finds it too difficult to get on the exam table due to her marked peripheral edema  Cardiac rhythm is regular Lungs clear bilaterally Abdomen soft nontender with good bowel sounds Marked bilateral leg edema, she has a large Ace wrap on the left side   Labs:     Latest Ref Rng & Units 10/18/2023   10:57 AM 09/14/2023    1:19 PM 08/22/2023   10:47 AM  CBC  WBC 4.0 - 10.5 K/uL 3.7  3.5  5.0   Hemoglobin 12.0 - 15.0 g/dL 88.3  88.4  88.1   Hematocrit 36.0 - 46.0 % 36.0  35.0  36.1   Platelets 150.0 - 400.0 K/uL 327.0  392.0  355.0  Iron /TIBC/Ferritin/ %Sat    Component Value Date/Time   IRON  94 10/18/2023 1057   IRON  93 06/26/2015 0912   TIBC 271.6 10/18/2023 1057   TIBC 264 06/26/2015 0912   FERRITIN 64.4 10/18/2023 1057   IRONPCTSAT 34.6 10/18/2023 1057   IRONPCTSAT 33 05/27/2023 1102    ___________________________________________ Radiologic studies:   ____________________________________________ Other:  1. Surgical [P], duodenal :      - BENIGN SMALL BOWEL MUCOSA WITH NO SIGNIFICANT PATHOLOGIC CHANGES       2. Surgical [P], gastric antrum and gastric body erosions :      - GASTRIC ANTRAL AND OXYNTIC MUCOSA WITH MILD CHRONIC GASTRITIS AND FEATURES OF      REACTIVE GASTROPATHY      - NEGATIVE FOR H. PYLORI ON H&E AND IMMUNOHISTOCHEMICAL STAIN      - NEGATIVE FOR INTESTINAL METAPLASIA, DYSPLASIA OR MALIGNANCY       3. Surgical [P], gastric polyp :      - POLYPOID FRAGMENTS OF GASTRIC MUCOSA WITH FOVEOLAR HYPERPLASIA      - NEGATIVE FOR H.PYLORI ON H AND E AND IMMUNOHISTOCHEMICAL STAIN      - NEGATIVE FOR INTESTINAL METAPLASIA, DYSPLASIA AND MALIGNANCY  _____________________________________________   Encounter Diagnoses  Name  Primary?   Iron  deficiency anemia, unspecified iron  deficiency anemia type Yes   Large hiatal hernia    Gastric polyp     Assessment & Plan   Benign gastric polyp confirmed on biopsies Large hiatal hernia contributing to reflux, but those reflux symptoms are well-controlled on once daily PPI and with her diet and lifestyle measures.  No dysphagia or odynophagia.  Would not advocate surgical repair of that at present, particular with her other medical issues.  This anemia appears multifactorial since it persist despite normalization of her iron  levels.  She had some Cameron erosions in the hiatal hernia sac that could be causing some occult GI blood loss.  As noted in the office consult and subsequent endoscopy report, she may have had a more significant source of GI blood loss such as an aspirin induced ulcer that had healed by the time we did the procedure. She needs follow-up with hematology for further evaluation of her persistent anemia despite normalization of iron  levels as there are likely other contributing factors.  She will return to see me as needed (and she is on recall for a May 2026 colonoscopy for screening since there was a fair prep on the most recent exam)    Victory LITTIE Brand III

## 2023-11-04 ENCOUNTER — Telehealth: Payer: Self-pay

## 2023-11-04 ENCOUNTER — Other Ambulatory Visit: Payer: Self-pay

## 2023-11-04 DIAGNOSIS — M1990 Unspecified osteoarthritis, unspecified site: Secondary | ICD-10-CM

## 2023-11-04 NOTE — Telephone Encounter (Signed)
 [facility] called stating they have not received orders , please fax the orders

## 2023-11-04 NOTE — Telephone Encounter (Signed)
 Copied from CRM #[id number]. Topic: Referral - Question >> Nov 03, 2023 11:56 AM Chasity T wrote: Reason for CRM: Patient is calling in to request a callback regarding a bone density referral that was requested for her. She wants to receive a call back at [phone]  Called pt was advised order placed for bone density, she should hear from someone soon.

## 2023-11-07 ENCOUNTER — Inpatient Hospital Stay: Admitting: Family

## 2023-11-07 ENCOUNTER — Inpatient Hospital Stay

## 2023-11-07 NOTE — Telephone Encounter (Signed)
 Called pt was advised I talk with [facility] and faxed order over.

## 2023-11-07 NOTE — Addendum Note (Signed)
 Addended by: Kendale Rembold M on: 11/07/2023 10:17 AM   Modules accepted: Orders

## 2023-11-08 ENCOUNTER — Ambulatory Visit: Payer: Medicare Other | Admitting: Family Medicine

## 2023-11-14 ENCOUNTER — Inpatient Hospital Stay: Attending: Hematology & Oncology

## 2023-11-14 ENCOUNTER — Inpatient Hospital Stay (HOSPITAL_BASED_OUTPATIENT_CLINIC_OR_DEPARTMENT_OTHER): Admitting: Family

## 2023-11-14 ENCOUNTER — Ambulatory Visit: Payer: Self-pay | Admitting: *Deleted

## 2023-11-14 VITALS — BP 125/82 | HR 83 | Temp 97.8°F | Resp 17 | Ht 61.0 in | Wt 206.0 lb

## 2023-11-14 DIAGNOSIS — Z86718 Personal history of other venous thrombosis and embolism: Secondary | ICD-10-CM | POA: Diagnosis not present

## 2023-11-14 DIAGNOSIS — D649 Anemia, unspecified: Secondary | ICD-10-CM

## 2023-11-14 DIAGNOSIS — I872 Venous insufficiency (chronic) (peripheral): Secondary | ICD-10-CM | POA: Diagnosis not present

## 2023-11-14 DIAGNOSIS — I890 Lymphedema, not elsewhere classified: Secondary | ICD-10-CM | POA: Diagnosis not present

## 2023-11-14 DIAGNOSIS — J4489 Other specified chronic obstructive pulmonary disease: Secondary | ICD-10-CM | POA: Diagnosis not present

## 2023-11-14 DIAGNOSIS — K219 Gastro-esophageal reflux disease without esophagitis: Secondary | ICD-10-CM | POA: Diagnosis not present

## 2023-11-14 DIAGNOSIS — Z79899 Other long term (current) drug therapy: Secondary | ICD-10-CM | POA: Diagnosis not present

## 2023-11-14 DIAGNOSIS — L989 Disorder of the skin and subcutaneous tissue, unspecified: Secondary | ICD-10-CM | POA: Diagnosis not present

## 2023-11-14 DIAGNOSIS — I87312 Chronic venous hypertension (idiopathic) with ulcer of left lower extremity: Secondary | ICD-10-CM | POA: Diagnosis not present

## 2023-11-14 DIAGNOSIS — I10 Essential (primary) hypertension: Secondary | ICD-10-CM | POA: Diagnosis not present

## 2023-11-14 DIAGNOSIS — D509 Iron deficiency anemia, unspecified: Secondary | ICD-10-CM | POA: Diagnosis not present

## 2023-11-14 DIAGNOSIS — L97222 Non-pressure chronic ulcer of left calf with fat layer exposed: Secondary | ICD-10-CM | POA: Diagnosis not present

## 2023-11-14 LAB — CBC WITH DIFFERENTIAL (CANCER CENTER ONLY)
Abs Immature Granulocytes: 0.01 K/uL (ref 0.00–0.07)
Basophils Absolute: 0 K/uL (ref 0.0–0.1)
Basophils Relative: 0 %
Eosinophils Absolute: 0.1 K/uL (ref 0.0–0.5)
Eosinophils Relative: 2 %
HCT: 34.2 % — ABNORMAL LOW (ref 36.0–46.0)
Hemoglobin: 10.8 g/dL — ABNORMAL LOW (ref 12.0–15.0)
Immature Granulocytes: 0 %
Lymphocytes Relative: 19 %
Lymphs Abs: 0.6 K/uL — ABNORMAL LOW (ref 0.7–4.0)
MCH: 28.1 pg (ref 26.0–34.0)
MCHC: 31.6 g/dL (ref 30.0–36.0)
MCV: 88.8 fL (ref 80.0–100.0)
Monocytes Absolute: 0.3 K/uL (ref 0.1–1.0)
Monocytes Relative: 10 %
Neutro Abs: 2.3 K/uL (ref 1.7–7.7)
Neutrophils Relative %: 69 %
Platelet Count: 374 K/uL (ref 150–400)
RBC: 3.85 MIL/uL — ABNORMAL LOW (ref 3.87–5.11)
RDW: 14.6 % (ref 11.5–15.5)
WBC Count: 3.3 K/uL — ABNORMAL LOW (ref 4.0–10.5)
nRBC: 0 % (ref 0.0–0.2)

## 2023-11-14 LAB — RETICULOCYTES
Immature Retic Fract: 6.1 % (ref 2.3–15.9)
RBC.: 3.81 MIL/uL — ABNORMAL LOW (ref 3.87–5.11)
Retic Count, Absolute: 62.5 K/uL (ref 19.0–186.0)
Retic Ct Pct: 1.6 % (ref 0.4–3.1)

## 2023-11-14 LAB — CMP (CANCER CENTER ONLY)
ALT: 11 U/L (ref 0–44)
AST: 30 U/L (ref 15–41)
Albumin: 3.2 g/dL — ABNORMAL LOW (ref 3.5–5.0)
Alkaline Phosphatase: 83 U/L (ref 38–126)
Anion gap: 9 (ref 5–15)
BUN: 14 mg/dL (ref 8–23)
CO2: 22 mmol/L (ref 22–32)
Calcium: 8.8 mg/dL — ABNORMAL LOW (ref 8.9–10.3)
Chloride: 103 mmol/L (ref 98–111)
Creatinine: 0.91 mg/dL (ref 0.44–1.00)
GFR, Estimated: >60 mL/min (ref >60)
Glucose, Bld: 77 mg/dL (ref 70–99)
Potassium: 4.2 mmol/L (ref 3.5–5.1)
Sodium: 134 mmol/L — ABNORMAL LOW (ref 135–145)
Total Bilirubin: 0.6 mg/dL (ref 0.0–1.2)
Total Protein: 8.8 g/dL — ABNORMAL HIGH (ref 6.5–8.1)

## 2023-11-14 LAB — IRON AND IRON BINDING CAPACITY (CC-WL,HP ONLY)
Iron: 83 ug/dL (ref 28–170)
Saturation Ratios: 30 % (ref 10.4–31.8)
TIBC: 273 ug/dL (ref 250–450)
UIBC: 190 ug/dL

## 2023-11-14 LAB — FERRITIN: Ferritin: 76 ng/mL (ref 11–307)

## 2023-11-14 NOTE — Progress Notes (Signed)
 Hematology and Oncology Follow Up Visit  [name] [id number] [birth year] 63 y.o. 11/14/2023   Principle Diagnosis:  Iron  deficiency anemia  Current Therapy:   OTC PO iron  supplement daily   Interim History:  [name] is here today for follow-up. She is doing well but has a wound/ulcer on the back of her left calf that is slow healing. She has been seeing Dr. Augusta with wound care and dressing regularly.  No abnormal blood loss noted. No bruising or petechiae.  No fever, chills, n/v, cough, rash, dizziness, SOB, chest pain, palpitations, abdominal pain or changes in bowel or bladder habits.  Chronic edema in her lower extremities unchanged from baseline.  She denies any numbness or tingling.  No falls or syncope reported. Appetite and hydration are good. Weight is stable at 206 lbs.   ECOG Performance Status: 1 - Symptomatic but completely ambulatory  Medications:  Allergies as of 11/14/2023       Reactions   Butorphanol Tartrate Other (See Comments)   Gave her the shakes for 6 weeks   Levofloxacin Palpitations   REACTION: tachycardia   Moxifloxacin Palpitations   REACTION: tachycardia but tolerated cirpofloxacin just fine   Stadol [butorphanol] Other (See Comments)   Caused shakes for 6 weeks shaking for weeks   Amoxicillin Rash   REACTION: Rash with high doses. Can take lower doses like 500mg    Asa [aspirin] Other (See Comments)   Pt stated the 81 mg caused Exacerbation of her asthma   Ciprofloxacin  Hcl Nausea And Vomiting   Can take lower doses like 500mg .   Clindamycin /lincomycin Nausea And Vomiting   Codeine Nausea And Vomiting   Erythromycin Diarrhea, Nausea And Vomiting   Latex Rash   Losartan  Nausea Only, Other (See Comments)   Headache, fatigue   Losartan  Potassium Nausea Only, Other (See Comments), Nausea And Vomiting   headache   Paroxetine Other (See Comments)   Made nerves worse and caused fatigue   Venlafaxine Other (See Comments)    hyperactivity Panic attacks hyperactivity, Panic attacks   Cephalosporins Other (See Comments)   REACTION: she has tolerated rocephin  and Keflex  without difficulty   Clarithromycin Diarrhea   Clindamycin  Diarrhea   Influenza Vaccines Other (See Comments)   Pt states that she got really sick from flu vaccine was sick a total of 6 weeks   Paxil [paroxetine Hcl] Other (See Comments)   fatigue   Tape Rash        Medication List        Accurate as of November 14, 2023 10:52 AM. If you have any questions, ask your nurse or doctor.          ACIDOPHILUS PO Take 1 capsule by mouth daily.   albuterol  (2.5 MG/3ML) 0.083% nebulizer solution Commonly known as: PROVENTIL  Take 3 mLs (2.5 mg total) by nebulization every 6 (six) hours as needed for wheezing or shortness of breath.   albuterol  108 (90 Base) MCG/ACT inhaler Commonly known as: VENTOLIN  HFA Inhale 1-2 puffs into the lungs every 6 (six) hours as needed for wheezing or shortness of breath.   ALPRAZolam  1 MG tablet Commonly known as: XANAX  Take 0.5-1 tablets (0.5-1 mg total) by mouth at bedtime as needed for anxiety.   amLODipine  2.5 MG tablet Commonly known as: NORVASC  TAKE 1 TABLET(2.5 MG) BY MOUTH IN THE MORNING AND AT BEDTIME   augmented betamethasone  dipropionate 0.05 % cream Commonly known as: DIPROLENE -AF Apply topically 2 (two) times daily.   budesonide -formoterol  160-4.5 MCG/ACT inhaler  Commonly known as: SYMBICORT  Inhale 2 puffs into the lungs 2 (two) times daily.   Iron  (Ferrous Sulfate ) 325 (65 Fe) MG Tabs Take 325 mg by mouth in the morning, at noon, and at bedtime. What changed: when to take this   Nebulizers Misc 1 Units by Misc.(Non-Drug; Combo Route) route every 8 (eight) hours as needed.   pantoprazole  40 MG tablet Commonly known as: PROTONIX  TAKE 1 TABLET(40 MG) BY MOUTH DAILY   Semaglutide -Weight Management 1.7 MG/0.75ML Soaj Inject 1.7 mg into the skin once a week.   sertraline  50 MG  tablet Commonly known as: ZOLOFT  Take 1 tablet (50 mg total) by mouth daily.   Sterile Saline Soln Use to clean wound twice daily.   VITAMIN E PO Take by mouth daily.        Allergies:  Allergies  Allergen Reactions   Butorphanol Tartrate Other (See Comments)    Gave her the shakes for 6 weeks   Levofloxacin Palpitations    REACTION: tachycardia   Moxifloxacin Palpitations    REACTION: tachycardia but tolerated cirpofloxacin just fine   Stadol [Butorphanol] Other (See Comments)    Caused shakes for 6 weeks shaking for weeks   Amoxicillin Rash    REACTION: Rash with high doses. Can take lower doses like 500mg    Asa [Aspirin] Other (See Comments)    Pt stated the 81 mg caused Exacerbation of her asthma    Ciprofloxacin  Hcl Nausea And Vomiting    Can take lower doses like 500mg .   Clindamycin /Lincomycin Nausea And Vomiting   Codeine Nausea And Vomiting   Erythromycin Diarrhea and Nausea And Vomiting   Latex Rash   Losartan  Nausea Only and Other (See Comments)    Headache, fatigue   Losartan  Potassium Nausea Only, Other (See Comments) and Nausea And Vomiting    headache   Paroxetine Other (See Comments)    Made nerves worse and caused fatigue   Venlafaxine Other (See Comments)    hyperactivity  Panic attacks  hyperactivity, Panic attacks   Cephalosporins Other (See Comments)    REACTION: she has tolerated rocephin  and Keflex  without difficulty   Clarithromycin Diarrhea   Clindamycin  Diarrhea   Influenza Vaccines Other (See Comments)    Pt states that she got really sick from flu vaccine was sick a total of 6 weeks   Paxil [Paroxetine Hcl] Other (See Comments)    fatigue   Tape Rash    Past Medical History, Surgical history, Social history, and Family History were reviewed and updated.  Review of Systems: All other 10 point review of systems is negative.   Physical Exam:  vitals were not taken for this visit.   Wt Readings from Last 3 Encounters:   11/02/23 213 lb 12.8 oz (97 kg)  10/18/23 208 lb (94.3 kg)  09/14/23 211 lb 9.6 oz (96 kg)    Ocular: Sclerae unicteric, pupils equal, round and reactive to light Ear-nose-throat: Oropharynx clear, dentition fair Lymphatic: No cervical or supraclavicular adenopathy Lungs no rales or rhonchi, good excursion bilaterally Heart regular rate and rhythm, no murmur appreciated Abd soft, nontender, positive bowel sounds MSK no focal spinal tenderness, no joint edema Neuro: non-focal, well-oriented, appropriate affect Breasts: Deferred   Lab Results  Component Value Date   WBC 3.3 (L) 11/14/2023   HGB 10.8 (L) 11/14/2023   HCT 34.2 (L) 11/14/2023   MCV 88.8 11/14/2023   PLT 374 11/14/2023   Lab Results  Component Value Date   FERRITIN 64.4 10/18/2023  IRON  94 10/18/2023   TIBC 271.6 10/18/2023   UIBC 268 06/20/2023   IRONPCTSAT 34.6 10/18/2023   Lab Results  Component Value Date   RETICCTPCT 1.6 11/14/2023   RBC 3.81 (L) 11/14/2023   RBC 3.85 (L) 11/14/2023   No results found for: KPAFRELGTCHN, LAMBDASER, KAPLAMBRATIO No results found for: KIMBERLY LE, IGMSERUM Lab Results  Component Value Date   ALBUMINELP 3.4 (L) 02/14/2019   A1GS 0.3 02/14/2019   A2GS 0.8 02/14/2019   BETS 0.5 02/14/2019   BETA2SER 0.5 02/14/2019   GAMS 2.8 (H) 02/14/2019   SPEI  02/14/2019     Comment:     . Consistent with a chronic inflammatory pattern .      Chemistry      Component Value Date/Time   NA 133 (L) 10/18/2023 1057   NA 138 06/23/2016 1247   K 3.9 10/18/2023 1057   K 3.7 06/23/2016 1247   CL 102 10/18/2023 1057   CO2 25 10/18/2023 1057   CO2 28 06/23/2016 1247   BUN 18 10/18/2023 1057   BUN 30.6 (H) 06/23/2016 1247   CREATININE 0.79 10/18/2023 1057   CREATININE 0.82 06/20/2023 1058   CREATININE 0.90 01/15/2020 1358   CREATININE 1.0 06/23/2016 1247      Component Value Date/Time   CALCIUM 9.3 10/18/2023 1057   CALCIUM 9.4 06/23/2016 1247   ALKPHOS 84  10/18/2023 1057   ALKPHOS 85 06/23/2016 1247   AST 28 10/18/2023 1057   AST 25 06/20/2023 1058   AST 26 06/23/2016 1247   ALT 14 10/18/2023 1057   ALT 12 06/20/2023 1058   ALT 26 06/23/2016 1247   BILITOT 0.6 10/18/2023 1057   BILITOT 0.5 06/20/2023 1058   BILITOT 0.55 06/23/2016 1247       Impression and Plan: Ms. Pardi is a pleasant 63 yo caucasian female with IDA.  Iron  studies are looking good.  She states that GI had her stop taking aspirin and continue to follow- along.  Continue daily oral iron  supplement.  Follow-up in 4 months.   Lauraine Pepper, NP 8/4/202510:52 AM

## 2023-11-14 NOTE — Telephone Encounter (Signed)
 This nurse called and left a message stating, the Iron  studies look great! Continue the daily oral iron  supplement per Lauraine Pepper, NP. If you have any questions or concerns, please call the office.

## 2023-11-14 NOTE — Telephone Encounter (Signed)
-----   Message from Lauraine Pepper sent at 11/14/2023  2:11 PM EDT ----- Iron  studies look great! Continue daily oral iron  supplement.  ----- Message ----- From: Interface, Lab In [place] Sent: 11/14/2023  10:44 AM EDT To: Lauraine CHRISTELLA Pepper, NP

## 2023-11-29 DIAGNOSIS — Z86718 Personal history of other venous thrombosis and embolism: Secondary | ICD-10-CM | POA: Diagnosis not present

## 2023-11-29 DIAGNOSIS — I872 Venous insufficiency (chronic) (peripheral): Secondary | ICD-10-CM | POA: Diagnosis not present

## 2023-11-29 DIAGNOSIS — I10 Essential (primary) hypertension: Secondary | ICD-10-CM | POA: Diagnosis not present

## 2023-11-29 DIAGNOSIS — L989 Disorder of the skin and subcutaneous tissue, unspecified: Secondary | ICD-10-CM | POA: Diagnosis not present

## 2023-11-29 DIAGNOSIS — L97222 Non-pressure chronic ulcer of left calf with fat layer exposed: Secondary | ICD-10-CM | POA: Diagnosis not present

## 2023-11-29 DIAGNOSIS — K219 Gastro-esophageal reflux disease without esophagitis: Secondary | ICD-10-CM | POA: Diagnosis not present

## 2023-11-29 DIAGNOSIS — Z79899 Other long term (current) drug therapy: Secondary | ICD-10-CM | POA: Diagnosis not present

## 2023-11-29 DIAGNOSIS — I890 Lymphedema, not elsewhere classified: Secondary | ICD-10-CM | POA: Diagnosis not present

## 2023-11-29 DIAGNOSIS — I87312 Chronic venous hypertension (idiopathic) with ulcer of left lower extremity: Secondary | ICD-10-CM | POA: Diagnosis not present

## 2023-11-29 DIAGNOSIS — J4489 Other specified chronic obstructive pulmonary disease: Secondary | ICD-10-CM | POA: Diagnosis not present

## 2023-12-05 ENCOUNTER — Other Ambulatory Visit: Payer: Self-pay | Admitting: Family Medicine

## 2023-12-08 DIAGNOSIS — I87312 Chronic venous hypertension (idiopathic) with ulcer of left lower extremity: Secondary | ICD-10-CM | POA: Diagnosis not present

## 2023-12-08 DIAGNOSIS — I872 Venous insufficiency (chronic) (peripheral): Secondary | ICD-10-CM | POA: Diagnosis not present

## 2023-12-08 DIAGNOSIS — I890 Lymphedema, not elsewhere classified: Secondary | ICD-10-CM | POA: Diagnosis not present

## 2023-12-08 DIAGNOSIS — Z86718 Personal history of other venous thrombosis and embolism: Secondary | ICD-10-CM | POA: Diagnosis not present

## 2023-12-08 DIAGNOSIS — J4489 Other specified chronic obstructive pulmonary disease: Secondary | ICD-10-CM | POA: Diagnosis not present

## 2023-12-08 DIAGNOSIS — K219 Gastro-esophageal reflux disease without esophagitis: Secondary | ICD-10-CM | POA: Diagnosis not present

## 2023-12-08 DIAGNOSIS — L989 Disorder of the skin and subcutaneous tissue, unspecified: Secondary | ICD-10-CM | POA: Diagnosis not present

## 2023-12-08 DIAGNOSIS — L97222 Non-pressure chronic ulcer of left calf with fat layer exposed: Secondary | ICD-10-CM | POA: Diagnosis not present

## 2023-12-08 DIAGNOSIS — Z79899 Other long term (current) drug therapy: Secondary | ICD-10-CM | POA: Diagnosis not present

## 2023-12-08 DIAGNOSIS — I10 Essential (primary) hypertension: Secondary | ICD-10-CM | POA: Diagnosis not present

## 2023-12-15 ENCOUNTER — Telehealth: Payer: Self-pay

## 2023-12-15 NOTE — Telephone Encounter (Signed)
 Copied from CRM [id number]. Topic: Clinical - Medical Advice >> Dec 15, 2023  2:35 PM Rea ORN wrote: Reason for CRM: Pt called to request Tammy Eckard call her back regarding several questions about medications. She also has questions about wound care supplies. Please call back [phone]

## 2023-12-16 NOTE — Progress Notes (Signed)
 Patient called - she had questions about providers for wound care with [facility] and also GYN.  Provided with number for Wound Care at [facility] and GYN number for [facility].   She also mentioned that wound care supplies are very expensive. She asks if I know how she might be able to get some help with cost. She is getting supplies thru [facility].   I checked to see if there might be a grant that would cover wound care but I was unable to locate any. Instructed a patient to check her supplies for name of manufacturers and I would look to see if any of the manufacturers have assistance programs. Patient will check and call me back next week.

## 2023-12-19 DIAGNOSIS — Z1231 Encounter for screening mammogram for malignant neoplasm of breast: Secondary | ICD-10-CM | POA: Diagnosis not present

## 2023-12-19 DIAGNOSIS — R92323 Mammographic fibroglandular density, bilateral breasts: Secondary | ICD-10-CM | POA: Diagnosis not present

## 2023-12-19 DIAGNOSIS — Z13820 Encounter for screening for osteoporosis: Secondary | ICD-10-CM | POA: Diagnosis not present

## 2023-12-19 DIAGNOSIS — M1990 Unspecified osteoarthritis, unspecified site: Secondary | ICD-10-CM | POA: Diagnosis not present

## 2023-12-19 LAB — HM DEXA SCAN: HM Dexa Scan: NORMAL

## 2023-12-19 LAB — HM MAMMOGRAPHY

## 2023-12-20 ENCOUNTER — Ambulatory Visit: Payer: Self-pay | Admitting: Family Medicine

## 2023-12-20 ENCOUNTER — Encounter: Payer: Self-pay | Admitting: Family Medicine

## 2023-12-22 DIAGNOSIS — I872 Venous insufficiency (chronic) (peripheral): Secondary | ICD-10-CM | POA: Diagnosis not present

## 2023-12-22 DIAGNOSIS — L97922 Non-pressure chronic ulcer of unspecified part of left lower leg with fat layer exposed: Secondary | ICD-10-CM | POA: Diagnosis not present

## 2023-12-22 DIAGNOSIS — L97222 Non-pressure chronic ulcer of left calf with fat layer exposed: Secondary | ICD-10-CM | POA: Diagnosis not present

## 2023-12-26 ENCOUNTER — Telehealth: Payer: Self-pay | Admitting: Pharmacist

## 2023-12-26 NOTE — Telephone Encounter (Signed)
 Patient left message on VM of Clinical Pharmacist Practitioner with questions regarding ways to lower cost of her wound care supplies.  Tried to call patient back to discuss but no answer. LM o n VM with my call back number.

## 2023-12-27 ENCOUNTER — Telehealth: Payer: Self-pay | Admitting: Family Medicine

## 2023-12-27 NOTE — Telephone Encounter (Signed)
 Copied from CRM [id number]. Topic: General - Call Back - No Documentation >> Dec 26, 2023  3:51 PM [name] wrote: Reason for CRM: Pt called requesting a callback from [facility], she has question about prescription.  Callback: [phone]

## 2023-12-27 NOTE — Telephone Encounter (Signed)
 Patient is seeing Wound Center with [facility]. She reports that the supplies are expensive. She is getting from [facility] and cost is about $75 or more per month.  She provided list of supplies -  Dukal gauze sponges 4 X 4, 12 ply - 200 ct.  - cost at retail pharmacy is about $14.92.  Nexcare / 8M durapore tape 1 in wide, 10 yards - 2 pk of Nexcare is $11.21 or 12pk of 8M $17.38 Saline wound spray - Aureliano med is $5.98 for 6 ounces or Equate brand if $4.56 for 7.4 ounces Dukal 2 inch paper tape - 10 yds - $21.25 fro box of 12 Med Choice 4 X 4; 6 ply gauze rolls for her legs - 48 rolls $47.99 or 12 rolls for $19.99 Abdominal pads by MediPride or MedChoice - 20 ct is about $9.   Provided above information to patient.  Called [facility] to see if they could bill Medicare Part B and they are not able to do so.  Recommended patient discuss further with wound care center to see if they know of a pharmacy that could bill Medicare Part B, if her specific wound would qualify.  She could also consider home health coming out for dressing changes because they usually are able to bill her medical benefits for these supplies but patient states she would prefer not to have home health.

## 2024-01-02 ENCOUNTER — Telehealth: Payer: Self-pay | Admitting: Family Medicine

## 2024-01-02 NOTE — Telephone Encounter (Signed)
Please change referral location

## 2024-01-02 NOTE — Telephone Encounter (Signed)
 Copied from CRM #[id number]. Topic: Referral - Request for Referral >> Dec 30, 2023  2:54 PM Suzen RAMAN wrote: Did the patient discuss referral with their provider in the last year? Yes (If No - schedule appointment) (If Yes - send message)  Appointment offered? No  Type of order/referral and detailed reason for visit: Wound Care, draining wound that hasn't improved with the current care she receiving at [facility]  Preference of office, provider, location: [facility] Fax#[phone]  Phone #[phone] Dr. Luba  If referral order, have you been seen by this specialty before? Yes, this issue, (If Yes, this issue or another issue? When? 12/22/23 Where?[facility]   Can we respond through MyChart? No  Patient would like a call back once referral was placed.  CB#[phone]

## 2024-01-03 NOTE — Telephone Encounter (Unsigned)
 Copied from CRM #[id number]. Topic: Referral - Question >> Jan 03, 2024  2:31 PM Mesmerise C wrote: Patient checking status of referral advised was sent for it to be changed to [facility] [address] [place], [place], [place] [address] would like a call back once the referral has been placed

## 2024-01-03 NOTE — Telephone Encounter (Signed)
 Pt.notified

## 2024-01-09 DIAGNOSIS — I890 Lymphedema, not elsewhere classified: Secondary | ICD-10-CM | POA: Diagnosis not present

## 2024-01-16 DIAGNOSIS — T8133XA Disruption of traumatic injury wound repair, initial encounter: Secondary | ICD-10-CM | POA: Diagnosis not present

## 2024-01-16 DIAGNOSIS — I83029 Varicose veins of left lower extremity with ulcer of unspecified site: Secondary | ICD-10-CM | POA: Diagnosis not present

## 2024-01-16 DIAGNOSIS — I890 Lymphedema, not elsewhere classified: Secondary | ICD-10-CM | POA: Diagnosis not present

## 2024-01-16 DIAGNOSIS — L97929 Non-pressure chronic ulcer of unspecified part of left lower leg with unspecified severity: Secondary | ICD-10-CM | POA: Diagnosis not present

## 2024-01-18 ENCOUNTER — Encounter (HOSPITAL_BASED_OUTPATIENT_CLINIC_OR_DEPARTMENT_OTHER): Admitting: General Surgery

## 2024-01-24 DIAGNOSIS — I83029 Varicose veins of left lower extremity with ulcer of unspecified site: Secondary | ICD-10-CM | POA: Diagnosis not present

## 2024-01-24 DIAGNOSIS — L97929 Non-pressure chronic ulcer of unspecified part of left lower leg with unspecified severity: Secondary | ICD-10-CM | POA: Diagnosis not present

## 2024-01-24 DIAGNOSIS — L88 Pyoderma gangrenosum: Secondary | ICD-10-CM | POA: Diagnosis not present

## 2024-01-24 DIAGNOSIS — I890 Lymphedema, not elsewhere classified: Secondary | ICD-10-CM | POA: Diagnosis not present

## 2024-01-24 DIAGNOSIS — C4490 Unspecified malignant neoplasm of skin, unspecified: Secondary | ICD-10-CM | POA: Diagnosis not present

## 2024-01-31 DIAGNOSIS — I890 Lymphedema, not elsewhere classified: Secondary | ICD-10-CM | POA: Diagnosis not present

## 2024-01-31 DIAGNOSIS — I83029 Varicose veins of left lower extremity with ulcer of unspecified site: Secondary | ICD-10-CM | POA: Diagnosis not present

## 2024-01-31 DIAGNOSIS — T8133XA Disruption of traumatic injury wound repair, initial encounter: Secondary | ICD-10-CM | POA: Diagnosis not present

## 2024-01-31 DIAGNOSIS — L88 Pyoderma gangrenosum: Secondary | ICD-10-CM | POA: Diagnosis not present

## 2024-01-31 DIAGNOSIS — T8133XD Disruption of traumatic injury wound repair, subsequent encounter: Secondary | ICD-10-CM | POA: Diagnosis not present

## 2024-01-31 DIAGNOSIS — L97929 Non-pressure chronic ulcer of unspecified part of left lower leg with unspecified severity: Secondary | ICD-10-CM | POA: Diagnosis not present

## 2024-02-07 ENCOUNTER — Other Ambulatory Visit: Payer: Self-pay

## 2024-02-07 MED ORDER — PANTOPRAZOLE SODIUM 40 MG PO TBEC: DELAYED_RELEASE_TABLET | ORAL | 2 refills | Status: DC

## 2024-03-13 ENCOUNTER — Ambulatory Visit: Admitting: Family

## 2024-03-13 ENCOUNTER — Inpatient Hospital Stay

## 2024-03-15 ENCOUNTER — Other Ambulatory Visit: Payer: Self-pay | Admitting: Family Medicine

## 2024-03-16 ENCOUNTER — Ambulatory Visit: Admitting: Family

## 2024-03-16 ENCOUNTER — Inpatient Hospital Stay

## 2024-03-21 ENCOUNTER — Telehealth: Payer: Self-pay | Admitting: Family Medicine

## 2024-03-21 NOTE — Telephone Encounter (Signed)
 Copied from CRM #[id number]. Topic: Medicare AWV >> Mar 21, 2024 11:30 AM Nathanel DEL wrote: Called LVM 03/21/2024 to sched AWV. Please schedule in office. (no video or phone visits).   Nathanel Paschal; Care Guide Ambulatory Clinical Support [place] [facility] Direct Dial: [phone]

## 2024-04-20 ENCOUNTER — Encounter: Payer: Self-pay | Admitting: Oncology

## 2024-04-24 ENCOUNTER — Encounter: Payer: Self-pay | Admitting: Oncology

## 2024-04-30 ENCOUNTER — Other Ambulatory Visit: Payer: Self-pay | Admitting: Family Medicine

## 2024-05-01 ENCOUNTER — Encounter: Payer: Self-pay | Admitting: Family Medicine

## 2024-05-01 ENCOUNTER — Ambulatory Visit: Payer: Self-pay | Admitting: Family Medicine

## 2024-05-01 ENCOUNTER — Ambulatory Visit: Admitting: Family Medicine

## 2024-05-01 VITALS — BP 130/80 | HR 85 | Temp 98.0°F | Resp 16 | Ht 61.0 in | Wt 210.0 lb

## 2024-05-01 DIAGNOSIS — R300 Dysuria: Secondary | ICD-10-CM

## 2024-05-01 DIAGNOSIS — Z6839 Body mass index (BMI) 39.0-39.9, adult: Secondary | ICD-10-CM

## 2024-05-01 DIAGNOSIS — E669 Obesity, unspecified: Secondary | ICD-10-CM | POA: Diagnosis not present

## 2024-05-01 DIAGNOSIS — Z0000 Encounter for general adult medical examination without abnormal findings: Secondary | ICD-10-CM

## 2024-05-01 LAB — URINALYSIS, MICROSCOPIC ONLY: RBC / HPF: NONE SEEN

## 2024-05-01 LAB — COMPREHENSIVE METABOLIC PANEL WITH GFR
ALT: 14 U/L (ref 3–35)
AST: 25 U/L (ref 5–37)
Albumin: 3.1 g/dL — ABNORMAL LOW (ref 3.5–5.2)
Alkaline Phosphatase: 77 U/L (ref 39–117)
BUN: 29 mg/dL — ABNORMAL HIGH (ref 6–23)
CO2: 26 meq/L (ref 19–32)
Calcium: 8.8 mg/dL (ref 8.4–10.5)
Chloride: 100 meq/L (ref 96–112)
Creatinine, Ser: 0.95 mg/dL (ref 0.40–1.20)
GFR: 63.63 mL/min
Glucose, Bld: 67 mg/dL — ABNORMAL LOW (ref 70–99)
Potassium: 3.9 meq/L (ref 3.5–5.1)
Sodium: 131 meq/L — ABNORMAL LOW (ref 135–145)
Total Bilirubin: 0.4 mg/dL (ref 0.2–1.2)
Total Protein: 9.7 g/dL — ABNORMAL HIGH (ref 6.0–8.3)

## 2024-05-01 LAB — CBC
HCT: 33.2 % — ABNORMAL LOW (ref 36.0–46.0)
Hemoglobin: 11.1 g/dL — ABNORMAL LOW (ref 12.0–15.0)
MCHC: 33.5 g/dL (ref 30.0–36.0)
MCV: 85.8 fl (ref 78.0–100.0)
Platelets: 279 K/uL (ref 150.0–400.0)
RBC: 3.88 Mil/uL (ref 3.87–5.11)
RDW: 14.9 % (ref 11.5–15.5)
WBC: 3 K/uL — ABNORMAL LOW (ref 4.0–10.5)

## 2024-05-01 LAB — LIPID PANEL
Cholesterol: 122 mg/dL (ref 28–200)
HDL: 37.7 mg/dL — ABNORMAL LOW
LDL Cholesterol: 73 mg/dL (ref 10–99)
NonHDL: 84.58
Total CHOL/HDL Ratio: 3
Triglycerides: 60 mg/dL (ref 10.0–149.0)
VLDL: 12 mg/dL (ref 0.0–40.0)

## 2024-05-01 MED ORDER — BUDESONIDE-FORMOTEROL FUMARATE 160-4.5 MCG/ACT IN AERO: 2.0000 | INHALATION_SPRAY | Freq: Two times a day (BID) | RESPIRATORY_TRACT | 12 refills | Status: AC

## 2024-05-01 MED ORDER — AIRSUPRA 90-80 MCG/ACT IN AERO: 2.0000 | INHALATION_SPRAY | Freq: Four times a day (QID) | RESPIRATORY_TRACT | 2 refills | Status: AC | PRN

## 2024-05-01 MED ORDER — PANTOPRAZOLE SODIUM 40 MG PO TBEC: DELAYED_RELEASE_TABLET | ORAL | 3 refills | Status: AC

## 2024-05-01 MED ORDER — ALPRAZOLAM 1 MG PO TABS: 0.5000 mg | ORAL_TABLET | Freq: Every evening | ORAL | 1 refills | Status: AC | PRN

## 2024-05-01 MED ORDER — ALBUTEROL SULFATE (2.5 MG/3ML) 0.083% IN NEBU: 2.5000 mg | INHALATION_SOLUTION | Freq: Four times a day (QID) | RESPIRATORY_TRACT | 1 refills | Status: AC | PRN

## 2024-05-01 MED ORDER — SEMAGLUTIDE-WEIGHT MANAGEMENT 1.5 MG PO TABS: 1.5000 mg | ORAL_TABLET | Freq: Every day | ORAL | 0 refills | Status: AC

## 2024-05-01 MED ORDER — SEMAGLUTIDE-WEIGHT MANAGEMENT 9 MG PO TABS: 9.0000 mg | ORAL_TABLET | Freq: Every day | ORAL | 0 refills | Status: AC

## 2024-05-01 MED ORDER — SEMAGLUTIDE-WEIGHT MANAGEMENT 25 MG PO TABS: 25.0000 mg | ORAL_TABLET | Freq: Every day | ORAL | 5 refills | Status: AC

## 2024-05-01 MED ORDER — SERTRALINE HCL 50 MG PO TABS: 50.0000 mg | ORAL_TABLET | Freq: Every day | ORAL | 3 refills | Status: AC

## 2024-05-01 MED ORDER — AMLODIPINE BESYLATE 2.5 MG PO TABS: ORAL_TABLET | ORAL | 3 refills | Status: AC

## 2024-05-01 MED ORDER — SEMAGLUTIDE-WEIGHT MANAGEMENT 4 MG PO TABS: 4.0000 mg | ORAL_TABLET | Freq: Every day | ORAL | 0 refills | Status: AC

## 2024-05-01 MED ORDER — TRIAMTERENE-HCTZ 75-50 MG PO TABS: 1.0000 | ORAL_TABLET | Freq: Every day | ORAL | 3 refills | Status: AC

## 2024-05-01 NOTE — Progress Notes (Signed)
 Chief Complaint  Patient presents with   Annual Exam    CPE     Well Woman [name] is here for a complete physical.   Her last physical was >1 year ago.  Current diet: in general, a healthy diet. Current exercise: strength training, elliptical machine. Weight is stable and she denies fatigue out of ordinary. Seatbelt? Yes Advanced directive? No  Health Maintenance Mammogram- Yes Colon cancer screening-Yes Shingrix-due for second vaccination Tetanus- Yes Hep C screening- Yes HIV screening- Yes  Obesity Patient has a history of obesity.  She is not diabetic and not currently taking any medication due to poor insurance coverage.  She has read about oral Wegovy  and would be interested in potentially paying out-of-pocket for this.  She was on Wegovy  injections in the past and did very well.  Diet/exercise as above.  Patient started having burning with urination 2 days ago.  No bleeding, discharge, fevers, nausea/vomiting.  Feels like previous UTIs.  Would like to have her urine checked today.  No new sexual partners.  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  The Custom gene panel offered by [facility] includes sequencing and rearrangement analysis for the following 24 genes:  ATM, BAP1, BARD1, BRCA1, BRCA2, BRIP1, CDH1, CDK4, CDKN2A, CHEK2, EPCAM, FANCC, MITF, MLH1, MSH2, MSH6, NBN, PALB2, PMS2, PTEN, RAD51C, RAD51D, TP53, and XRCC2.   The report date is February 13, 2016.   GERD (gastroesophageal reflux disease)    Heart murmur    History of  melanoma excision 03/23/2015   Hypertension    Leukopenia due to antineoplastic chemotherapy 04/16/2015   Melanoma (HCC) dx'd 2003   right leg   Morbid obesity with BMI of 50.0-59.9, adult (HCC) 03/23/2015   OPEN WOUND OF KNEE LEG AND ANKLE COMPLICATED 03/10/2009   Qualifier: Diagnosis of  By: Lutricia RN, Denise     ovarian ca dx'd 10/216   Seasonal allergies    Umbilical hernia without obstruction and without gangrene 03/23/2015   Urinary, incontinence, stress female 10/27/2016     Past Surgical History:  Procedure Laterality Date   ABDOMINAL HYSTERECTOMY Bilateral 02/11/2015   Procedure: HYSTERECTOMY ABDOMINAL, bilateral salpingo-oophorectomy;  Surgeon: Alm Cook, MD;  Location: [facility];  Service: Gynecology;  Laterality: Bilateral;   COLONOSCOPY  2013   COLONOSCOPY WITH ESOPHAGOGASTRODUODENOSCOPY (EGD)     DILATATION & CURETTAGE/HYSTEROSCOPY WITH TRUECLEAR N/A 08/17/2013   Procedure: DILATATION & CURETTAGE/HYSTEROSCOPY WITH TRUCLEAR;  Surgeon: Alm JAYSON Cook, MD;  Location: [facility];  Service: Gynecology;  Laterality: N/A;   IR REMOVAL TUN ACCESS W/ PORT W/O FL MOD SED  07/05/2017   MELANOMA EXCISION  06/2001   right leg; also removed 2 lymph nodes   OMENTECTOMY N/A 02/11/2015   Procedure: OMENTECTOMY;  Surgeon: Alm Cook, MD;  Location: [facility];  Service: Gynecology;  Laterality: N/A;   UMBILICAL HERNIA REPAIR      Medications  Medications Ordered Prior to Encounter[1]   Allergies Allergies[2]  Review of Systems: Constitutional:  no unexpected weight changes Eye:  no recent significant change in vision Ear/Nose/Mouth/Throat:  Ears:  no recent change in hearing Nose/Mouth/Throat:  no complaints of nasal congestion, no sore throat Cardiovascular: no chest pain Respiratory:  no shortness of breath Gastrointestinal:  no abdominal pain, no change in bowel habits GU:  Female: + Dysuria Musculoskeletal/Extremities:  no new pain of the joints Integumentary (Skin/Breast):  no new  abnormal skin lesions reported Neurologic:  no headaches Endocrine:  denies fatigue  Exam BP 130/80 (BP Location: Left Arm, Patient Position: Sitting)   Pulse 85   Temp 98 F (36.7 C) (Oral)   Resp 16   Ht 5' 1 (1.549 m)   Wt 210 lb (95.3 kg)   LMP  (LMP Unknown)   SpO2 97%   BMI 39.68 kg/m  General:  well developed, well nourished, in no apparent distress Skin:  no new significant moles, warts, or growths on exposed skin surfaces; left lower extremity is wrapped/dressed Head:  no masses, lesions, or tenderness Eyes:  pupils equal and round, sclera anicteric without injection Ears:  canals without lesions, TMs shiny without retraction, no obvious effusion, no erythema Nose:  nares patent, mucosa normal, and no drainage  Throat/Pharynx:  lips and gingiva without lesion; tongue and uvula midline; non-inflamed pharynx; no exudates or postnasal drainage Neck: neck supple without adenopathy, thyromegaly, or masses Lungs:  clear to auscultation, breath sounds equal bilaterally, no respiratory distress Cardio:  regular rate and rhythm Abdomen:  abdomen soft, nontender; bowel sounds normal; no masses or organomegaly Genital: Defer to GYN Musculoskeletal:  symmetrical muscle groups noted without atrophy or deformity Extremities:  no clubbing, cyanosis, or edema, no deformities, no skin discoloration Neuro:  gait normal; deep tendon reflexes normal and symmetric Psych: well oriented with normal range of affect and appropriate judgment/insight  Assessment and Plan  Well adult exam - Plan: CBC, Comprehensive metabolic panel with GFR, Lipid panel  Obesity (BMI 30-39.9) - Plan: semaglutide -weight management (WEGOVY ) 4 MG tablet, semaglutide -weight management (WEGOVY ) 9 MG tablet, semaglutide -weight management (WEGOVY ) 25 MG tablet, semaglutide -weight management (WEGOVY ) 1.5 MG tablet  Dysuria - Plan: Urine Culture, Urine Microscopic Only   Well 64 y.o. female. Counseled on diet and  exercise. Obesity: Chronic, not controlled.  Start Wegovy  1.5 mg daily and titrate up every 30 days.  We did discuss potential out-of-pocket cost versus waiting until April and trying Zepbound .  She wishes to start something today.  She will let me know if there are any issues. Dysuria: Check urine.  Offered to empirically treat but she politely declined. Advanced directive form provided today.  Other orders as above. Follow up in 6 mo. The patient voiced understanding and agreement to the plan.  Mabel Mt Crocker, DO 05/01/24 12:00 PM     [1]  Current Outpatient Medications on File Prior to Visit  Medication Sig Dispense Refill   ferrous sulfate  (FEROSUL) 325 (65 FE) MG tablet Take 1 tablet (325 mg total) by mouth daily. 90 tablet 1   Lactobacillus (ACIDOPHILUS PO) Take 1 capsule by mouth daily.     Nebulizers MISC 1 Units by Misc.(Non-Drug; Combo Route) route every 8 (eight) hours as needed.     Soft Lens Products (STERILE SALINE) SOLN Use to clean wound twice daily. 360 mL 0   VITAMIN E PO Take by mouth daily.     No current facility-administered medications on file prior to visit.  [2]  Allergies Allergen Reactions   Butorphanol Tartrate Other (See Comments)    Gave her the shakes for 6  weeks   Levofloxacin Palpitations    REACTION: tachycardia   Moxifloxacin Palpitations    REACTION: tachycardia but tolerated cirpofloxacin just fine   Stadol [Butorphanol] Other (See Comments)    Caused shakes for 6 weeks shaking for weeks   Amoxicillin Rash    REACTION: Rash with high doses. Can take lower doses like 500mg    Asa [Aspirin] Other (See Comments)    Pt stated the 81 mg caused Exacerbation of her asthma    Ciprofloxacin  Hcl Nausea And Vomiting    Can take lower doses like 500mg .   Clindamycin /Lincomycin Nausea And Vomiting   Codeine Nausea And Vomiting   Erythromycin Diarrhea and Nausea And Vomiting   Latex Rash   Losartan  Nausea Only and Other (See Comments)     Headache, fatigue   Losartan  Potassium Nausea Only, Other (See Comments) and Nausea And Vomiting    headache   Paroxetine Other (See Comments)    Made nerves worse and caused fatigue   Venlafaxine Other (See Comments)    hyperactivity  Panic attacks  hyperactivity, Panic attacks   Cephalosporins Other (See Comments)    REACTION: she has tolerated rocephin  and Keflex  without difficulty   Clarithromycin Diarrhea   Clindamycin  Diarrhea   Influenza Vaccines Other (See Comments)    Pt states that she got really sick from flu vaccine was sick a total of 6 weeks   Paxil [Paroxetine Hcl] Other (See Comments)    fatigue   Tape Rash

## 2024-05-01 NOTE — Patient Instructions (Addendum)
 Give [place] 2-3 business days to get the results of your labs back.   Keep the diet clean and stay active.  Please get me a copy of your advanced directive form at your convenience.   Let us know if you need anything.

## 2024-05-02 LAB — URINE CULTURE
MICRO NUMBER:: 17490573
SPECIMEN QUALITY:: ADEQUATE

## 2024-11-06 ENCOUNTER — Ambulatory Visit: Admitting: Family Medicine
